# Patient Record
Sex: Male | Born: 1935
Health system: Southern US, Community
[De-identification: ages and names within clinical notes are randomized; demographics above are authoritative.]

## PROBLEM LIST (undated history)

## (undated) DIAGNOSIS — H259 Unspecified age-related cataract: Secondary | ICD-10-CM

## (undated) DIAGNOSIS — N400 Enlarged prostate without lower urinary tract symptoms: Secondary | ICD-10-CM

## (undated) DIAGNOSIS — Z95810 Presence of automatic (implantable) cardiac defibrillator: Secondary | ICD-10-CM

## (undated) DIAGNOSIS — D6869 Other thrombophilia: Secondary | ICD-10-CM

## (undated) DIAGNOSIS — N184 Chronic kidney disease, stage 4 (severe): Secondary | ICD-10-CM

## (undated) DIAGNOSIS — I255 Ischemic cardiomyopathy: Secondary | ICD-10-CM

## (undated) DIAGNOSIS — E782 Mixed hyperlipidemia: Secondary | ICD-10-CM

## (undated) DIAGNOSIS — G2581 Restless legs syndrome: Secondary | ICD-10-CM

## (undated) DIAGNOSIS — E559 Vitamin D deficiency, unspecified: Secondary | ICD-10-CM

## (undated) DIAGNOSIS — I480 Paroxysmal atrial fibrillation: Secondary | ICD-10-CM

## (undated) DIAGNOSIS — I509 Heart failure, unspecified: Secondary | ICD-10-CM

## (undated) DIAGNOSIS — I420 Dilated cardiomyopathy: Secondary | ICD-10-CM

## (undated) DIAGNOSIS — I2510 Atherosclerotic heart disease of native coronary artery without angina pectoris: Secondary | ICD-10-CM

## (undated) DIAGNOSIS — I639 Cerebral infarction, unspecified: Secondary | ICD-10-CM

## (undated) DIAGNOSIS — I10 Essential (primary) hypertension: Secondary | ICD-10-CM

## (undated) DIAGNOSIS — M1A9XX0 Chronic gout, unspecified, without tophus (tophi): Secondary | ICD-10-CM

## (undated) DIAGNOSIS — I5042 Chronic combined systolic (congestive) and diastolic (congestive) heart failure: Secondary | ICD-10-CM

## (undated) DIAGNOSIS — I4891 Unspecified atrial fibrillation: Secondary | ICD-10-CM

## (undated) DIAGNOSIS — I2721 Secondary pulmonary arterial hypertension: Secondary | ICD-10-CM

## (undated) DIAGNOSIS — D696 Thrombocytopenia, unspecified: Secondary | ICD-10-CM

## (undated) HISTORY — DX: Other thrombophilia: D68.69

## (undated) HISTORY — PX: CHOLECYSTECTOMY: SHX55

## (undated) HISTORY — PX: CORONARY ARTERY BYPASS GRAFT: SHX141

## (undated) HISTORY — DX: Vitamin D deficiency, unspecified: E55.9

## (undated) HISTORY — DX: Benign prostatic hyperplasia without lower urinary tract symptoms: N40.0

## (undated) HISTORY — DX: Essential (primary) hypertension: I10

## (undated) HISTORY — DX: Unspecified age-related cataract: H25.9

## (undated) HISTORY — DX: Ischemic cardiomyopathy: I25.5

## (undated) HISTORY — DX: Secondary pulmonary arterial hypertension: I27.21

## (undated) HISTORY — DX: Atherosclerotic heart disease of native coronary artery without angina pectoris: I25.10

## (undated) HISTORY — DX: Restless legs syndrome: G25.81

## (undated) HISTORY — DX: Chronic gout, unspecified, without tophus (tophi): M1A.9XX0

## (undated) HISTORY — DX: Mixed hyperlipidemia: E78.2

---
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
## 2016-05-31 DIAGNOSIS — I639 Cerebral infarction, unspecified: Secondary | ICD-10-CM

## 2016-05-31 HISTORY — DX: Cerebral infarction, unspecified: I63.9

## 2016-07-02 DIAGNOSIS — I639 Cerebral infarction, unspecified: Secondary | ICD-10-CM

## 2016-07-02 HISTORY — DX: Cerebral infarction, unspecified: I63.9

## 2018-10-30 HISTORY — PX: PACEMAKER IMPLANT: EP1218

## 2019-03-22 ENCOUNTER — Encounter: Payer: Self-pay | Admitting: Cardiology

## 2019-03-25 NOTE — Progress Notes (Signed)
Cardiology Office Note:    Date:  03/26/2019   ID:  Ricardo Payne, DOB May 29, 1931, MRN [id number]  PCP:  Patient, No Pcp Per  Cardiologist:  No primary care provider on file.  Electrophysiologist:  None   Referring MD: Jillyn Ledger, FNP  Chief Complaint  Patient presents with  . Congestive Heart Failure    History of Present Illness:    Ricardo Payne is a 85 y.o. male with a hx of CAD status post CABG in 1989, ischemic cardiomyopathy status post ICD 10/2018, atrial fibrillation, hypertension, hyperlipidemia, CVA  who is referred by Jillyn Ledger, FNP for an initial evaluation of heart failure.  Reports that he had a single-vessel CABG in 1989.  States that he has done well since that time, has not required further revascularization.  Has an ischemic cardiomyopathy status post ICD in June.  No issues with ICD.  Denies any shocks.  Reports that he had a syncopal episode in April, thought to be secondary to hyperkalemia.  He is on Eliquis 2.5 mg twice daily for his atrial fibrillation, was told that he is on lower dose Eliquis due to his age and renal function.  He has had issues with lower extremity edema, but was started on Bumex 1 mg daily in May and states that his symptoms have been well controlled since starting Bumex.  Reports he gets short of breath with walking up a flight of stairs but can walk about 2 blocks on level ground without stopping.  Denies any resting dyspnea.  Denies any chest pain.  No bleeding issues with Eliquis.  He moved to [facility] in September 2020.  Previously followed with a cardiologist in [place] (Dr Vergie Living with [facility]).     Past Medical History:  Diagnosis Date  . Acquired thrombophilia ([place])   . Age-related cataract of both eyes   . ASHD (arteriosclerotic heart disease)   . Atrial fibrillation ([place])   . Benign prostatic hyperplasia   . Cerebellar stroke ([place]) 07/02/2016  . Chronic congestive  heart failure ([place])   . Chronic gout without tophus   . Chronic kidney disease (CKD), stage III (moderate)    unspecified whether stage 3a or 3b  . Dilated cardiomyopathy ([place])   . Essential hypertension   . Ischemic cardiomyopathy   . Mixed hyperlipidemia   . Restless legs   . Secondary pulmonary arterial hypertension ([place])   . Vitamin D deficiency     Past Surgical History:  Procedure Laterality Date  . CORONARY ARTERY BYPASS GRAFT    . PACEMAKER IMPLANT  10/2018    Current Medications: Current Meds  Medication Sig  . allopurinol (ZYLOPRIM) 100 MG tablet Take 100 mg by mouth daily.  Marland Kitchen apixaban (ELIQUIS) 2.5 MG TABS tablet Take 2.5 mg by mouth 2 (two) times daily.  Marland Kitchen aspirin EC 81 MG tablet Take 81 mg by mouth daily.  . bumetanide (BUMEX) 1 MG tablet Take 1 mg by mouth daily.  . carvedilol (COREG) 6.25 MG tablet Take 6.25 mg by mouth 2 (two) times daily with a meal.  . ergocalciferol (VITAMIN D2) 1.25 MG (50000 UT) capsule Take 50,000 Units by mouth once a week.  . Folic Acid-Vit Q000111Q 123456 (FOLBEE) 2.5-25-1 MG TABS tablet Take 1 tablet by mouth daily.  . Niacin (VITAMIN B-3 PO) Take 1,000-2,000 mg by mouth 2 (two) times daily.  . pramipexole (MIRAPEX) 0.25 MG tablet Take 0.25 mg by mouth 2 (two) times daily.  Marland Kitchen  simvastatin (ZOCOR) 40 MG tablet Take 40 mg by mouth daily at 6 PM.  . spironolactone (ALDACTONE) 25 MG tablet Take 25 mg by mouth daily.  . tamsulosin (FLOMAX) 0.4 MG CAPS capsule Take 0.4 mg by mouth.     Allergies:   Patient has no known allergies.   Social History   Socioeconomic History  . Marital status: Widowed    Spouse name: Not on file  . Number of children: 2  . Years of education: Not on file  . Highest education level: Not on file  Occupational History  . Not on file  Social Needs  . Financial resource strain: Not on file  . Food insecurity    Worry: Not on file    Inability: Not on file  . Transportation needs    Medical: Not on file     Non-medical: Not on file  Tobacco Use  . Smoking status: Former Smoker    Quit date: 1988    Years since quitting: 32.8  . Smokeless tobacco: Never Used  Substance and Sexual Activity  . Alcohol use: Yes    Comment: rare  . Drug use: Never  . Sexual activity: Not on file  Lifestyle  . Physical activity    Days per week: Not on file    Minutes per session: Not on file  . Stress: Not on file  Relationships  . Social Herbalist on phone: Not on file    Gets together: Not on file    Attends religious service: Not on file    Active member of club or organization: Not on file    Attends meetings of clubs or organizations: Not on file    Relationship status: Not on file  Other Topics Concern  . Not on file  Social History Narrative  . Not on file     Family History: The patient's family history includes Heart attack in his brother; Heart disease in his father; Hypertension in his mother; Suicidality in his mother; Valvular heart disease in his father.  ROS:   Please see the history of present illness.     All other systems reviewed and are negative.  EKGs/Labs/Other Studies Reviewed:    The following studies were reviewed today:   EKG:  EKG is  ordered today.  The ekg ordered today demonstrates normal sinus rhythm, nonspecific interventricular conduction delay, nonspecific T wave flattening in I and aVL  Recent Labs: No results found for requested labs within last 8760 hours.  Recent Lipid Panel No results found for: CHOL, TRIG, HDL, CHOLHDL, VLDL, LDLCALC, LDLDIRECT  Physical Exam:    VS:  BP 129/73   Pulse 74   Temp (!) 97.3 F (36.3 C)   Ht 5\' 8"  (1.727 m)   Wt 203 lb 9.6 oz (92.4 kg)   SpO2 95%   BMI 30.96 kg/m     Wt Readings from Last 3 Encounters:  03/26/19 203 lb 9.6 oz (92.4 kg)     GEN:  Well nourished, well developed in no acute distress HEENT: Normal NECK: + JVD LYMPHATICS: No lymphadenopathy CARDIAC: RRR, no murmurs, rubs, gallops  RESPIRATORY:  Clear to auscultation without rales, wheezing or rhonchi  ABDOMEN: Soft, non-tender, non-distended MUSCULOSKELETAL:  Trace LE edema SKIN: Warm and dry NEUROLOGIC:  Alert and oriented x 3 PSYCHIATRIC:  Normal affect   ASSESSMENT:    1. Ischemic cardiomyopathy   2. Coronary artery disease involving native coronary artery of native heart without angina pectoris  3. Essential hypertension   4. Atrial fibrillation, unspecified type ([place])    PLAN:    In order of problems listed above:  CAD: Status post CABG in 1989.  No anginal symptoms.   -Will need to obtain records from prior cardiologist -Continue aspirin, beta-blocker -Check lipid panel.  Currently on simvastatin 40 mg daily, will likely need to switch to high intensity statin  Chronic systolic heart failure: ischemic cardiomyopathy.  Unknown ejection fraction.  On carvedilol 6.25 mg twice daily, Aldactone 25 mg daily, Bumex 1 mg daily.  Status post AICD -Obtain records as above -TTE -Refer to EP for device follow-up -Check BMET  Hypertension: On carvedilol 6.25 mg twice and Aldactone 25 mg daily.  Appears controlled  Atrial fibrillation: On carvedilol 6.25 mg twice daily.  On Eliquis 2.5 mg twice daily.  CHADS-VASc 7 - Unclear if appropriately on lower dose Eliquis.  Will check BMET to determine renal function - Continue carvedilol  RTC in 3 months   Medication Adjustments/Labs and Tests Ordered: Current medicines are reviewed at length with the patient today.  Concerns regarding medicines are outlined above.  Orders Placed This Encounter  Procedures  . Basic metabolic panel  . Lipid panel  . Magnesium  . Ambulatory referral to Cardiac Electrophysiology  . EKG 12-Lead  . ECHOCARDIOGRAM COMPLETE   No orders of the defined types were placed in this encounter.   Patient Instructions  Medication Instructions:  Continue same medications   Lab Work: Bmet,Magnesium,Lipid panel today    Testing/Procedures: Schedule Echo  Follow-Up: At [facility], you and your health needs are our priority.  As part of our continuing mission to provide you with exceptional heart care, we have created designated Provider Care Teams.  These Care Teams include your primary Cardiologist (physician) and Advanced Practice Providers (APPs -  Physician Assistants and Nurse Practitioners) who all work together to provide you with the care you need, when you need it.  Your next appointment:  3 months    Schedule appointment with EP for ICD      Signed, Donato Heinz, MD  03/26/2019 3:48 PM    [place]

## 2019-03-26 ENCOUNTER — Encounter: Payer: Self-pay | Admitting: Cardiology

## 2019-03-26 ENCOUNTER — Ambulatory Visit (INDEPENDENT_AMBULATORY_CARE_PROVIDER_SITE_OTHER): Payer: Medicare Other | Admitting: Cardiology

## 2019-03-26 ENCOUNTER — Other Ambulatory Visit: Payer: Self-pay

## 2019-03-26 VITALS — BP 129/73 | HR 74 | Temp 97.3°F | Ht 68.0 in | Wt 203.6 lb

## 2019-03-26 DIAGNOSIS — I2510 Atherosclerotic heart disease of native coronary artery without angina pectoris: Secondary | ICD-10-CM

## 2019-03-26 DIAGNOSIS — I4891 Unspecified atrial fibrillation: Secondary | ICD-10-CM

## 2019-03-26 DIAGNOSIS — I255 Ischemic cardiomyopathy: Secondary | ICD-10-CM | POA: Diagnosis not present

## 2019-03-26 DIAGNOSIS — I10 Essential (primary) hypertension: Secondary | ICD-10-CM | POA: Diagnosis not present

## 2019-03-26 NOTE — Patient Instructions (Signed)
Medication Instructions:  Continue same medications   Lab Work: Journalist, newspaper today   Testing/Procedures: Schedule Echo  Follow-Up: At [facility], you and your health needs are our priority.  As part of our continuing mission to provide you with exceptional heart care, we have created designated Provider Care Teams.  These Care Teams include your primary Cardiologist (physician) and Advanced Practice Providers (APPs -  Physician Assistants and Nurse Practitioners) who all work together to provide you with the care you need, when you need it.  Your next appointment:  3 months    Schedule appointment with EP for ICD

## 2019-03-27 ENCOUNTER — Telehealth: Payer: Self-pay | Admitting: Cardiology

## 2019-03-27 LAB — LIPID PANEL
Chol/HDL Ratio: 2 ratio (ref 0.0–5.0)
Cholesterol, Total: 144 mg/dL (ref 100–199)
HDL: 71 mg/dL (ref >39)
LDL Chol Calc (NIH): 59 mg/dL (ref 0–99)
Triglycerides: 70 mg/dL (ref 0–149)
VLDL Cholesterol Cal: 14 mg/dL (ref 5–40)

## 2019-03-27 LAB — BASIC METABOLIC PANEL
BUN/Creatinine Ratio: 28 — ABNORMAL HIGH (ref 10–24)
BUN: 58 mg/dL — ABNORMAL HIGH (ref 8–27)
CO2: 24 mmol/L (ref 20–29)
Calcium: 9 mg/dL (ref 8.6–10.2)
Chloride: 103 mmol/L (ref 96–106)
Creatinine, Ser: 2.04 mg/dL — ABNORMAL HIGH (ref 0.76–1.27)
GFR calc Af Amer: 33 mL/min/{1.73_m2} — ABNORMAL LOW (ref >59)
GFR calc non Af Amer: 29 mL/min/{1.73_m2} — ABNORMAL LOW (ref >59)
Glucose: 83 mg/dL (ref 65–99)
Potassium: 5.1 mmol/L (ref 3.5–5.2)
Sodium: 141 mmol/L (ref 134–144)

## 2019-03-27 LAB — MAGNESIUM: Magnesium: 2.1 mg/dL (ref 1.6–2.3)

## 2019-03-27 NOTE — Telephone Encounter (Signed)
[facility] received 33 pages of medical records from [facility] delivering to [facility] (Dr. Newman Nickels nurse). 03/27/19  KLM

## 2019-03-27 NOTE — Telephone Encounter (Signed)
[facility] faxed request for medical records to [facility] 03/27/19  KLM

## 2019-03-30 ENCOUNTER — Other Ambulatory Visit: Payer: Self-pay

## 2019-04-02 ENCOUNTER — Ambulatory Visit (HOSPITAL_COMMUNITY): Payer: Medicare Other | Attending: Internal Medicine

## 2019-04-02 ENCOUNTER — Other Ambulatory Visit: Payer: Self-pay

## 2019-04-02 DIAGNOSIS — I255 Ischemic cardiomyopathy: Secondary | ICD-10-CM | POA: Diagnosis present

## 2019-04-08 ENCOUNTER — Encounter: Payer: Self-pay | Admitting: Cardiology

## 2019-04-08 NOTE — Progress Notes (Signed)
Received records from patient's cardiologist in [place].  Meds: ASA 81, Bumex 1 mg qd, Coreg 12.5 BID, Eliquis 2.5 mg BID, simvastatin 40 mg   PMHx: CVA 07/02/16, AF, Chronic systolic heart failure, CABG x1 in 1990, CKD stage III, hypertension, hyperlipidemia  From documentation, baseline creatinine is around 2.0. Not on ACE inhibitor or Entresto given his CKD. He did not tolerate spironolactone secondary to hyperkalemia. Last echo 07/2018 showed LVEF 25 to 30%, mild to moderate MR, moderate to severe TR, severe pulmonary hypertension.  Pharmacologic stress test in 2017 showed an inferior apical infarct without ischemia.  He has a [employer] single-chamber ICD implanted on 03/17/2018. On last device interrogation from 10/25/2018, showed no ventricular arrhythmias. VP less than 1%.

## 2019-04-18 ENCOUNTER — Ambulatory Visit (INDEPENDENT_AMBULATORY_CARE_PROVIDER_SITE_OTHER): Payer: Medicare Other | Admitting: Internal Medicine

## 2019-04-18 ENCOUNTER — Other Ambulatory Visit: Payer: Self-pay

## 2019-04-18 ENCOUNTER — Encounter: Payer: Self-pay | Admitting: Internal Medicine

## 2019-04-18 DIAGNOSIS — I428 Other cardiomyopathies: Secondary | ICD-10-CM

## 2019-04-18 DIAGNOSIS — Z95810 Presence of automatic (implantable) cardiac defibrillator: Secondary | ICD-10-CM | POA: Insufficient documentation

## 2019-04-18 LAB — CUP PACEART INCLINIC DEVICE CHECK
Battery Remaining Longevity: 93 mo
Brady Statistic RV Percent Paced: 0.05 %
Date Time Interrogation Session: 20201118100654
HighPow Impedance: 55 Ohm
Implantable Lead Implant Date: 20191018
Implantable Lead Location: 753860
Implantable Pulse Generator Implant Date: 20191018
Lead Channel Impedance Value: 412.5 Ohm
Lead Channel Pacing Threshold Amplitude: 0.5 V
Lead Channel Pacing Threshold Pulse Width: 0.5 ms
Lead Channel Sensing Intrinsic Amplitude: 11.4 mV
Lead Channel Setting Pacing Amplitude: 2 V
Lead Channel Setting Pacing Pulse Width: 0.5 ms
Lead Channel Setting Sensing Sensitivity: 0.5 mV
Pulse Gen Serial Number: 9850983

## 2019-04-18 NOTE — Patient Instructions (Signed)
Medication Instructions:  Your physician recommends that you continue on your current medications as directed. Please refer to the Current Medication list given to you today.  Labwork: None ordered.  Testing/Procedures: None ordered.  Follow-Up: Your physician wants you to follow-up in: one year with Dr. Lovena Le.   You will receive a reminder letter in the mail two months in advance. If you don't receive a letter, please call our office to schedule the follow-up appointment.  Remote monitoring is used to monitor your ICD from home. This monitoring reduces the number of office visits required to check your device to one time per year. It allows [place] to keep an eye on the functioning of your device to ensure it is working properly. You are scheduled for a device check from home on 07/18/2019. You may send your transmission at any time that day. If you have a wireless device, the transmission will be sent automatically. After your physician reviews your transmission, you will receive a postcard with your next transmission date.  Any Other Special Instructions Will Be Listed Below (If Applicable).  If you need a refill on your cardiac medications before your next appointment, please call your pharmacy.

## 2019-04-18 NOTE — Progress Notes (Signed)
HPI Ricardo Payne is referred today by Dr. Nechama Guard for ongoing evaluation and management of his ICD. He is a pleasant 85 yo man with CAD, s/p CABG over 30 years ago. He has atrial fib and HTN. He has an EF of 40%. He has class 2 symptoms though he has been sedentary during Covid. He denies chest pain or peripheral edema.   No Known Allergies   Current Outpatient Medications  Medication Sig Dispense Refill  . allopurinol (ZYLOPRIM) 100 MG tablet Take 100 mg by mouth daily.    Marland Kitchen apixaban (ELIQUIS) 2.5 MG TABS tablet Take 2.5 mg by mouth 2 (two) times daily.    Marland Kitchen aspirin EC 81 MG tablet Take 81 mg by mouth daily.    . bumetanide (BUMEX) 1 MG tablet Take 1 mg by mouth daily.    . carvedilol (COREG) 6.25 MG tablet Take 3.125 mg by mouth 2 (two) times daily with a meal.    . ergocalciferol (VITAMIN D2) 1.25 MG (50000 UT) capsule Take 50,000 Units by mouth once a week.    . Folic Acid-Vit Q000111Q 123456 (FOLBEE) 2.5-25-1 MG TABS tablet Take 1 tablet by mouth daily.    . Niacin (VITAMIN B-3 PO) Take 1,000-2,000 mg by mouth 2 (two) times daily.    . pramipexole (MIRAPEX) 0.25 MG tablet Take 0.25 mg by mouth 2 (two) times daily.    . simvastatin (ZOCOR) 40 MG tablet Take 40 mg by mouth daily at 6 PM.     No current facility-administered medications for this visit.      Past Medical History:  Diagnosis Date  . Acquired thrombophilia ([place])   . Age-related cataract of both eyes   . ASHD (arteriosclerotic heart disease)   . Atrial fibrillation ([place])   . Benign prostatic hyperplasia   . Cerebellar stroke ([place]) 07/02/2016  . Chronic congestive heart failure ([place])   . Chronic gout without tophus   . Chronic kidney disease (CKD), stage III (moderate)    unspecified whether stage 3a or 3b  . Dilated cardiomyopathy ([place])   . Essential hypertension   . Ischemic cardiomyopathy   . Mixed hyperlipidemia   . Restless legs   . Secondary pulmonary arterial hypertension ([place])   . Vitamin D  deficiency     ROS:   All systems reviewed and negative except as noted in the HPI.   Past Surgical History:  Procedure Laterality Date  . CORONARY ARTERY BYPASS GRAFT    . PACEMAKER IMPLANT  10/2018     Family History  Problem Relation Age of Onset  . Hypertension Mother   . Suicidality Mother   . Valvular heart disease Father   . Heart disease Father   . Heart attack Brother      Social History   Socioeconomic History  . Marital status: Widowed    Spouse name: Not on file  . Number of children: 2  . Years of education: Not on file  . Highest education level: Not on file  Occupational History  . Not on file  Social Needs  . Financial resource strain: Not on file  . Food insecurity    Worry: Not on file    Inability: Not on file  . Transportation needs    Medical: Not on file    Non-medical: Not on file  Tobacco Use  . Smoking status: Former Smoker    Quit date: 1988    Years since quitting: 32.9  . Smokeless tobacco: Never  Used  Substance and Sexual Activity  . Alcohol use: Yes    Comment: rare  . Drug use: Never  . Sexual activity: Not on file  Lifestyle  . Physical activity    Days per week: Not on file    Minutes per session: Not on file  . Stress: Not on file  Relationships  . Social Herbalist on phone: Not on file    Gets together: Not on file    Attends religious service: Not on file    Active member of club or organization: Not on file    Attends meetings of clubs or organizations: Not on file    Relationship status: Not on file  . Intimate partner violence    Fear of current or ex partner: Not on file    Emotionally abused: Not on file    Physically abused: Not on file    Forced sexual activity: Not on file  Other Topics Concern  . Not on file  Social History Narrative  . Not on file     BP 136/76   Pulse 72   Ht 5\' 8"  (1.727 m)   Wt 212 lb 12.8 oz (96.5 kg)   SpO2 96%   BMI 32.36 kg/m   Physical Exam:  Well  appearing NAD HEENT: Unremarkable Neck:  No JVD, no thyromegally Lymphatics:  No adenopathy Back:  No CVA tenderness Lungs:  Clear with no wheezes HEART:  Regular rate rhythm, no murmurs, no rubs, no clicks Abd:  soft, positive bowel sounds, no organomegally, no rebound, no guarding Ext:  2 plus pulses, no edema, no cyanosis, no clubbing Skin:  No rashes no nodules Neuro:  CN II through XII intact, motor grossly intact  DEVICE  Normal device function.  See PaceArt for details.   Assess/Plan: 1. ICM - he is asymptomatic. I have recommended he continue his current meds and to remain active. 2. Chronic systolic heart failure - his symptoms are class 2. He will continue his current meds. 3. ICD - his [employer] single chamber ICD is working normally. We will recheck in several months.  Mikle Bosworth.D.

## 2019-04-23 ENCOUNTER — Telehealth: Payer: Self-pay | Admitting: *Deleted

## 2019-04-23 ENCOUNTER — Other Ambulatory Visit: Payer: Self-pay

## 2019-04-23 NOTE — Telephone Encounter (Signed)
Spoke with Langley Gauss at [facility], requested patient's release in [place]. She agreed to release patient today.

## 2019-04-24 ENCOUNTER — Other Ambulatory Visit: Payer: Self-pay

## 2019-04-24 NOTE — Telephone Encounter (Signed)
LM for [employer] Curator) as patient has not yet been released in [employer]. DC phone number given for call back.

## 2019-04-25 NOTE — Telephone Encounter (Signed)
Spoke with patient. Advised I am still working on transferring monitoring from previous clinic. Explained I will call with an update within the next couple of weeks. Patient verbalizes understanding and agreement with plan.

## 2019-05-08 NOTE — Telephone Encounter (Signed)
I spoke with the nurse and asked her to release the pt in [place] because the pt will be followed by dr.Taylor. She said she will give the message to the device clinic.

## 2019-05-10 NOTE — Telephone Encounter (Signed)
Spoke with patient. Assisted with manual transmission to update monitor. Advised future transmissions should be received automatically. Pt verbalizes understanding. No further questions at this time.

## 2019-05-10 NOTE — Telephone Encounter (Signed)
Pt is transferred into our system and is scheduled for a transmission for 07-18-2019.

## 2019-05-17 ENCOUNTER — Telehealth: Payer: Self-pay | Admitting: Cardiology

## 2019-05-17 NOTE — Progress Notes (Addendum)
Cardiology Office Note:    Date:  05/18/2019   ID:  Ricardo Payne, DOB July 27, 1930, MRN [id number]  PCP:  [name], FNP  Cardiologist:  No primary care provider on file.  Electrophysiologist:  None   Referring MD: Jillyn Ledger, FNP  No chief complaint on file.   History of Present Illness:    Ricardo Payne is a 85 y.o. male with a hx of CAD status post CABG x1 in 1990, ischemic cardiomyopathy status post ICD 02/2018, atrial fibrillation, hypertension, hyperlipidemia, CVA 07/2016, CKD stage III who presents for follow-up.  He was seen initially on 03/26/2019.  He had moved from [place] to [place] in September 2020.  He reported no issues, states that he had had issues with lower extremity edema earlier in the year but started on Bumex 1 mg daily in May and symptoms have been well controlled.  TTE from 07/2018 in [place] showed EF 25 to 30%.  TTE here in 04/2019 showed improvement in EF to 40 to 45%.  At initial appointment he was taking carvedilol 625 mg twice daily and Aldactone 25 mg daily.  Records from his cardiologist were obtained and indicated that he was not on an ACE/ARB/Entresto due to CKD (baseline creatinine around 2.0).  Records from cardiologist indicated that spironolactone was tried and discontinued secondary to hyperkalemia.  However he was still taking when seen in clinic.  Labs showed creatinine 2.04, potassium 5.1; he was told to discontinue his spironolactone.  He presents today with worsening lower extremity edema and shortness of breath.  States that he gained about 10 pounds after stopping spironolactone.  Also reports significant swelling in his legs.  States he has felt progressively more short of breath.  Denies any shortness of breath at rest, but states becomes short of breath with minimal exertion.  Has been sleeping on 3 pillows at night.  He denies any chest pain, lightheadedness or syncope.   Wt Readings from Last 3 Encounters:  05/18/19 217 lb  3.2 oz (98.5 kg)  04/18/19 212 lb 12.8 oz (96.5 kg)  03/26/19 203 lb 9.6 oz (92.4 kg)      Past Medical History:  Diagnosis Date  . Acquired thrombophilia ([place])   . Age-related cataract of both eyes   . ASHD (arteriosclerotic heart disease)   . Atrial fibrillation ([place])   . Benign prostatic hyperplasia   . Cerebellar stroke ([place]) 07/02/2016  . Chronic congestive heart failure ([place])   . Chronic gout without tophus   . Chronic kidney disease (CKD), stage III (moderate)    unspecified whether stage 3a or 3b  . Dilated cardiomyopathy ([place])   . Essential hypertension   . Ischemic cardiomyopathy   . Mixed hyperlipidemia   . Restless legs   . Secondary pulmonary arterial hypertension ([place])   . Vitamin D deficiency     Past Surgical History:  Procedure Laterality Date  . CORONARY ARTERY BYPASS GRAFT    . PACEMAKER IMPLANT  10/2018    Current Medications: Current Meds  Medication Sig  . allopurinol (ZYLOPRIM) 100 MG tablet Take 100 mg by mouth daily.  Marland Kitchen apixaban (ELIQUIS) 2.5 MG TABS tablet Take 2.5 mg by mouth 2 (two) times daily.  Marland Kitchen aspirin EC 81 MG tablet Take 81 mg by mouth daily.  . bumetanide (BUMEX) 1 MG tablet Take 1 mg by mouth as directed. TAKE 2 TABLETS TWICE A DAY  . carvedilol (COREG) 6.25 MG tablet Take 3.125 mg by mouth 2 (two) times daily  with a meal.  . ergocalciferol (VITAMIN D2) 1.25 MG (50000 UT) capsule Take 50,000 Units by mouth once a week.  . Folic Acid-Vit Q000111Q 123456 (FOLBEE) 2.5-25-1 MG TABS tablet Take 1 tablet by mouth daily.  . Niacin (VITAMIN B-3 PO) Take 1,000-2,000 mg by mouth 2 (two) times daily.  . pramipexole (MIRAPEX) 0.25 MG tablet Take 0.25 mg by mouth 2 (two) times daily.  . simvastatin (ZOCOR) 40 MG tablet Take 40 mg by mouth daily at 6 PM.     Allergies:   Patient has no known allergies.   Social History   Socioeconomic History  . Marital status: Widowed    Spouse name: Not on file  . Number of children: 2  . Years of education:  Not on file  . Highest education level: Not on file  Occupational History  . Not on file  Tobacco Use  . Smoking status: Former Smoker    Quit date: 1988    Years since quitting: 32.9  . Smokeless tobacco: Never Used  Substance and Sexual Activity  . Alcohol use: Yes    Comment: rare  . Drug use: Never  . Sexual activity: Not on file  Other Topics Concern  . Not on file  Social History Narrative  . Not on file   Social Determinants of Health   Financial Resource Strain:   . Difficulty of Paying Living Expenses: Not on file  Food Insecurity:   . Worried About Charity fundraiser in the Last Year: Not on file  . Ran Out of Food in the Last Year: Not on file  Transportation Needs:   . Lack of Transportation (Medical): Not on file  . Lack of Transportation (Non-Medical): Not on file  Physical Activity:   . Days of Exercise per Week: Not on file  . Minutes of Exercise per Session: Not on file  Stress:   . Feeling of Stress : Not on file  Social Connections:   . Frequency of Communication with Friends and Family: Not on file  . Frequency of Social Gatherings with Friends and Family: Not on file  . Attends Religious Services: Not on file  . Active Member of Clubs or Organizations: Not on file  . Attends Archivist Meetings: Not on file  . Marital Status: Not on file     Family History: The patient's family history includes Heart attack in his brother; Heart disease in his father; Hypertension in his mother; Suicidality in his mother; Valvular heart disease in his father.  ROS:   Please see the history of present illness.     All other systems reviewed and are negative.  EKGs/Labs/Other Studies Reviewed:    The following studies were reviewed today:   EKG:  EKG is  ordered today.  The ekg ordered today demonstrates normal sinus rhythm, rate 71, left bundle branch block, first-degree AV block TTE 04/02/19:  1. Left ventricular ejection fraction, by visual  estimation, is 40 to 45%. The left ventricle has moderately decreased function. Left ventricular septal wall thickness was normal. Mildly increased left ventricular posterior wall thickness. There is  borderline left ventricular hypertrophy.  2. Inferior and inferoseptal akinesis and thinning.  3. Elevated left atrial and left ventricular end-diastolic pressures.  4. Left ventricular diastolic parameters are consistent with Grade III diastolic dysfunction (restrictive).  5. Global right ventricle has mildly reduced systolic function.The right ventricular size is normal. No increase in right ventricular wall thickness.  6. Left atrial size was  severely dilated.  7. Right atrial size was moderately dilated.  8. The mitral valve is abnormal. Mild mitral valve regurgitation.  9. The tricuspid valve is grossly normal. Tricuspid valve regurgitation moderate. 10. Aortic valve area, by VTI measures 1.60 cm. 11. Aortic valve mean gradient measures 8.0 mmHg. 12. Aortic valve peak gradient measures 13.1 mmHg. 13. The aortic valve is tricuspid. Aortic valve regurgitation is trivial. Mild aortic valve stenosis. 14. The pulmonic valve was grossly normal. Pulmonic valve regurgitation is mild. 15. Moderately elevated pulmonary artery systolic pressure. 16. The inferior vena cava is dilated in size with >50% respiratory variability, suggesting right atrial pressure of 8 mmHg. 17. The average left ventricular global longitudinal strain is -10.7 %. 18. A ICD is visualized.  Recent Labs: 03/26/2019: BUN 58; Creatinine, Ser 2.04; Magnesium 2.1; Potassium 5.1; Sodium 141  Recent Lipid Panel    Component Value Date/Time   CHOL 144 03/26/2019 1425   TRIG 70 03/26/2019 1425   HDL 71 03/26/2019 1425   CHOLHDL 2.0 03/26/2019 1425   LDLCALC 59 03/26/2019 1425    Physical Exam:    VS:  BP 138/68   Pulse 71   Temp (!) 97 F (36.1 C)   Ht 5\' 8"  (1.727 m)   Wt 217 lb 3.2 oz (98.5 kg)   SpO2 98%   BMI 33.03  kg/m     Wt Readings from Last 3 Encounters:  05/18/19 217 lb 3.2 oz (98.5 kg)  04/18/19 212 lb 12.8 oz (96.5 kg)  03/26/19 203 lb 9.6 oz (92.4 kg)     GEN:  Well nourished, well developed in no acute distress HEENT: Normal NECK: + JVD LYMPHATICS: No lymphadenopathy CARDIAC: RRR, no murmurs, rubs, gallops RESPIRATORY:  Clear to auscultation without rales, wheezing or rhonchi  ABDOMEN: Soft, non-tender, non-distended MUSCULOSKELETAL:  2+ LE edema SKIN: Warm and dry NEUROLOGIC:  Alert and oriented x 3 PSYCHIATRIC:  Normal affect   ASSESSMENT:    1. Acute on chronic systolic heart failure ([place])   2. Therapeutic drug monitoring   3. SOB (shortness of breath)   4. Coronary artery disease involving native coronary artery of native heart without angina pectoris   5. Paroxysmal atrial fibrillation ([facility])    PLAN:    In order of problems listed above:  Acute on chronic systolic heart failure: ischemic cardiomyopathy.  EF 40 to 45%.  On carvedilol 6.25 mg twice daily, Bumex 1 mg daily.  Status post AICD.  Appears decompensated.  Warm and wet on exam.  Weight up 14 lbs since office visit in October. -Will check CMET, CBC, BNP -Discussed that patient may need admission for IV diuresis.  He would like to avoid this and attempt to manage as outpatient.  Recommend increasing Bumex to 2 mg twice daily and have close follow-up at beginning of next week to assess response.  If not improving, may need admission for IV diuresis.  Advised that if worsening symptoms over weekend should go to the ED for evaluation  CAD: Status post CABG x1 in 1990.  Denies any chest pain  -Continue aspirin, beta-blocker -Currently on simvastatin 40 mg daily, LDL 59, at goal less than 70  Hypertension: On carvedilol 6.25 mg twice.  Appears controlled  Atrial fibrillation: On carvedilol 6.25 mg twice daily.  On Eliquis 2.5 mg twice daily.  CHADS-VASc 7 - Continue carvedilol - Continue Eliquis 2.5 mg BID, which is  an appropriate dose for him given age greater than 59 and creatinine greater than 1.5  Follow-up scheduled for 12/21   Medication Adjustments/Labs and Tests Ordered: Current medicines are reviewed at length with the patient today.  Concerns regarding medicines are outlined above.  Orders Placed This Encounter  Procedures  . CBC w/Diff  . Comprehensive Metabolic Panel (CMET)  . B Nat Peptide  . EKG 12/Charge capture   No orders of the defined types were placed in this encounter.   Patient Instructions  Medication Instructions:  INCREASE YOUR BUMETANIDE TO 1 MG 2 TABLETS TWICE A DAY   *If you need a refill on your cardiac medications before your next appointment, please call your pharmacy*  Lab Work: CMET/BNP/CBC TODAY   If you have labs (blood work) drawn today and your tests are completely normal, you will receive your results only by: Marland Kitchen MyChart Message (if you have MyChart) OR . A paper copy in the mail If you have any lab test that is abnormal or we need to change your treatment, we will call you to review the results.  Testing/Procedures: NONE  Follow-Up: Monday 05/21/2019 3:15 PM WITH Ricardo Long DNP     Signed, Donato Heinz, MD  05/18/2019 6:57 PM    [place]

## 2019-05-17 NOTE — Telephone Encounter (Signed)
I spoke to the patient who called because he has weight gain, swelling in lower legs/toes and SOB.  His Spironolactone was stopped on 10/30 and since then he has experienced these symptoms, primarily at night.  He weighed 212 lbs on 11/18 and today weighs 223 lbs. Please advise, thank you.

## 2019-05-17 NOTE — Telephone Encounter (Signed)
Can we add him on to my schedule for a clinic appointment tomorrow?

## 2019-05-17 NOTE — Telephone Encounter (Signed)
Pt c/o swelling: STAT is pt has developed SOB within 24 hours  1) How much weight have you gained and in what time span? He gained  Weight, but does not know how much  2) If swelling, where is the swelling located? Knee down to his toes  3) Are you currently taking a fluid pill?  yes  4) Are you currently SOB?  Yes- not right now 5)  6) Do you have a log of your daily weights (if so, list)? no  7) Have you gained 3 pounds in a day or 5 pounds in a week? Not sure  8) Have you traveled recently? no

## 2019-05-17 NOTE — Telephone Encounter (Signed)
I spoke to the patient and scheduled OV with Dr Gardiner Rhyme on 12/18 @ 2:25 pm.  He verbalized understanding.

## 2019-05-18 ENCOUNTER — Encounter: Payer: Self-pay | Admitting: Cardiology

## 2019-05-18 ENCOUNTER — Other Ambulatory Visit: Payer: Self-pay

## 2019-05-18 ENCOUNTER — Ambulatory Visit (INDEPENDENT_AMBULATORY_CARE_PROVIDER_SITE_OTHER): Payer: Medicare Other | Admitting: Cardiology

## 2019-05-18 VITALS — BP 138/68 | HR 71 | Temp 97.0°F | Ht 68.0 in | Wt 217.2 lb

## 2019-05-18 DIAGNOSIS — Z5181 Encounter for therapeutic drug level monitoring: Secondary | ICD-10-CM | POA: Diagnosis not present

## 2019-05-18 DIAGNOSIS — I2510 Atherosclerotic heart disease of native coronary artery without angina pectoris: Secondary | ICD-10-CM | POA: Diagnosis not present

## 2019-05-18 DIAGNOSIS — I480 Paroxysmal atrial fibrillation: Secondary | ICD-10-CM

## 2019-05-18 DIAGNOSIS — I5023 Acute on chronic systolic (congestive) heart failure: Secondary | ICD-10-CM

## 2019-05-18 DIAGNOSIS — R0602 Shortness of breath: Secondary | ICD-10-CM | POA: Diagnosis not present

## 2019-05-18 NOTE — Patient Instructions (Addendum)
Medication Instructions:  INCREASE YOUR BUMETANIDE TO 1 MG 2 TABLETS TWICE A DAY   *If you need a refill on your cardiac medications before your next appointment, please call your pharmacy*  Lab Work: CMET/BNP/CBC TODAY   If you have labs (blood work) drawn today and your tests are completely normal, you will receive your results only by: Marland Kitchen MyChart Message (if you have MyChart) OR . A paper copy in the mail If you have any lab test that is abnormal or we need to change your treatment, we will call you to review the results.  Testing/Procedures: NONE  Follow-Up: Monday 05/21/2019 3:15 PM WITH Arnold Long DNP

## 2019-05-19 LAB — CBC WITH DIFFERENTIAL/PLATELET
Basophils Absolute: 0 10*3/uL (ref 0.0–0.2)
Basos: 1 %
EOS (ABSOLUTE): 0.1 10*3/uL (ref 0.0–0.4)
Eos: 2 %
Hematocrit: 34.3 % — ABNORMAL LOW (ref 37.5–51.0)
Hemoglobin: 11.7 g/dL — ABNORMAL LOW (ref 13.0–17.7)
Immature Grans (Abs): 0 10*3/uL (ref 0.0–0.1)
Immature Granulocytes: 0 %
Lymphocytes Absolute: 0.9 10*3/uL (ref 0.7–3.1)
Lymphs: 14 %
MCH: 32 pg (ref 26.6–33.0)
MCHC: 34.1 g/dL (ref 31.5–35.7)
MCV: 94 fL (ref 79–97)
Monocytes Absolute: 0.5 10*3/uL (ref 0.1–0.9)
Monocytes: 8 %
Neutrophils Absolute: 4.6 10*3/uL (ref 1.4–7.0)
Neutrophils: 75 %
Platelets: 84 10*3/uL — CL (ref 150–450)
RBC: 3.66 x10E6/uL — ABNORMAL LOW (ref 4.14–5.80)
RDW: 13.1 % (ref 11.6–15.4)
WBC: 6.1 10*3/uL (ref 3.4–10.8)

## 2019-05-19 LAB — COMPREHENSIVE METABOLIC PANEL
ALT: 24 IU/L (ref 0–44)
AST: 27 IU/L (ref 0–40)
Albumin/Globulin Ratio: 1.9 (ref 1.2–2.2)
Albumin: 4.6 g/dL (ref 3.6–4.6)
Alkaline Phosphatase: 161 IU/L — ABNORMAL HIGH (ref 39–117)
BUN/Creatinine Ratio: 29 — ABNORMAL HIGH (ref 10–24)
BUN: 51 mg/dL — ABNORMAL HIGH (ref 8–27)
Bilirubin Total: 1.3 mg/dL — ABNORMAL HIGH (ref 0.0–1.2)
CO2: 21 mmol/L (ref 20–29)
Calcium: 9.4 mg/dL (ref 8.6–10.2)
Chloride: 105 mmol/L (ref 96–106)
Creatinine, Ser: 1.75 mg/dL — ABNORMAL HIGH (ref 0.76–1.27)
GFR calc Af Amer: 40 mL/min/{1.73_m2} — ABNORMAL LOW (ref >59)
GFR calc non Af Amer: 34 mL/min/{1.73_m2} — ABNORMAL LOW (ref >59)
Globulin, Total: 2.4 g/dL (ref 1.5–4.5)
Glucose: 102 mg/dL — ABNORMAL HIGH (ref 65–99)
Potassium: 4.5 mmol/L (ref 3.5–5.2)
Sodium: 143 mmol/L (ref 134–144)
Total Protein: 7 g/dL (ref 6.0–8.5)

## 2019-05-19 LAB — BRAIN NATRIURETIC PEPTIDE: BNP: 1044.7 pg/mL — ABNORMAL HIGH (ref 0.0–100.0)

## 2019-05-20 NOTE — Progress Notes (Signed)
Cardiology Office Note   Date:  05/21/2019   ID:  Cypher, [name] 04-26-1931, MRN [id number]  PCP:  [name], FNP  Cardiologist:   No chief complaint on file.    History of Present Illness: Ricardo Payne is a 85 y.o. male who presents for chronic systolic heart failure, ischemic cardiomyopathy, EF of 40% to 45%.  Status post AICD.  Atrial fibrillation, CAD status post CABG x1 in 1990, chronic left bundle branch block, and hyperlipidemia.  He was last seen by Dr. Carlena Hurl on 05/18/2019 and was found to be volume overloaded, with symptoms of PND, lower extremity edema, and dyspnea.  The patient had gained approximately 10 pounds after stopping spironolactone..  Was discussed with the patient be admitted for IV diuresis.  The patient wanted to wait to be admitted and   be treated as an outpatient with close follow-up.  Bumex was increased to 2 mg twice daily.  He was continued on carvedilol 6.25 mg twice daily.  Weight on that office visit 217 pounds with a base line dry weight of 203 pounds.  He was advised that if his symptoms worsened over the weekend he was to be seen in the emergency room.  [name] comes today feeling much better, he has lost approximately 4 pounds dropping from 217 pounds to 213 pounds over 3 days.  He states that he is breathing better is able to lie down in his bed without being short of breath, he offers no complaints of PND.  He continues to have some lower extremity edema but his legs are better, per his observance.  He states that he was able to climb the stairs in his home now without being short of breath.  Past Medical History:  Diagnosis Date  . Acquired thrombophilia ([place])   . Age-related cataract of both eyes   . ASHD (arteriosclerotic heart disease)   . Atrial fibrillation ([place])   . Benign prostatic hyperplasia   . Cerebellar stroke ([place]) 07/02/2016  . Chronic congestive heart failure ([place])   . Chronic gout without tophus   .  Chronic kidney disease (CKD), stage III (moderate)    unspecified whether stage 3a or 3b  . Dilated cardiomyopathy ([place])   . Essential hypertension   . Ischemic cardiomyopathy   . Mixed hyperlipidemia   . Restless legs   . Secondary pulmonary arterial hypertension ([place])   . Vitamin D deficiency     Past Surgical History:  Procedure Laterality Date  . CORONARY ARTERY BYPASS GRAFT    . PACEMAKER IMPLANT  10/2018     Current Outpatient Medications  Medication Sig Dispense Refill  . allopurinol (ZYLOPRIM) 100 MG tablet Take 100 mg by mouth daily.    Marland Kitchen apixaban (ELIQUIS) 2.5 MG TABS tablet Take 2.5 mg by mouth 2 (two) times daily.    Marland Kitchen aspirin EC 81 MG tablet Take 81 mg by mouth daily.    . bumetanide (BUMEX) 1 MG tablet Take 2 tablets (2 mg total) by mouth as directed. 180 tablet 2  . carvedilol (COREG) 6.25 MG tablet Take 3.125 mg by mouth 2 (two) times daily with a meal.    . ergocalciferol (VITAMIN D2) 1.25 MG (50000 UT) capsule Take 50,000 Units by mouth once a week.    . Folic Acid-Vit Q000111Q 123456 (FOLBEE) 2.5-25-1 MG TABS tablet Take 1 tablet by mouth daily.    . Niacin (VITAMIN B-3 PO) Take 1,000-2,000 mg by mouth 2 (two) times daily.    Marland Kitchen  pramipexole (MIRAPEX) 0.25 MG tablet Take 0.25 mg by mouth 2 (two) times daily.    . simvastatin (ZOCOR) 40 MG tablet Take 40 mg by mouth daily at 6 PM.     No current facility-administered medications for this visit.    Allergies:   Patient has no known allergies.    Social History:  The patient  reports that he quit smoking about 32 years ago. He has never used smokeless tobacco. He reports current alcohol use. He reports that he does not use drugs.   Family History:  The patient's family history includes Heart attack in his brother; Heart disease in his father; Hypertension in his mother; Suicidality in his mother; Valvular heart disease in his father.    ROS: All other systems are reviewed and negative. Unless otherwise mentioned in  H&P    PHYSICAL EXAM: VS:  BP 124/72   Pulse 74   Temp 98.2 F (36.8 C)   Ht 5\' 8"  (1.727 m)   Wt 213 lb 6.4 oz (96.8 kg)   SpO2 97%   BMI 32.45 kg/m  , BMI Body mass index is 32.45 kg/m. GEN: Well nourished, well developed, in no acute distress HEENT: normal Neck: no JVD, carotid bruits, or masses Cardiac: RRR; no murmurs, rubs, or gallops, 3+ pitting edema with shiny skin and petechiae noted bilaterally.  Respiratory: Normal respiratory effort, some crackles in the bases but no wheezing. GI: soft, nontender, nondistended, + BS MS: no deformity or atrophy Skin: warm and dry, no rash Neuro:  Strength and sensation are intact Psych: euthymic mood, full affect   EKG: Not completed this office visit  Recent Labs: 03/26/2019: Magnesium 2.1 05/18/2019: ALT 24; BNP 1,044.7; BUN 51; Creatinine, Ser 1.75; Hemoglobin 11.7; Platelets 84; Potassium 4.5; Sodium 143    Lipid Panel    Component Value Date/Time   CHOL 144 03/26/2019 1425   TRIG 70 03/26/2019 1425   HDL 71 03/26/2019 1425   CHOLHDL 2.0 03/26/2019 1425   LDLCALC 59 03/26/2019 1425      Wt Readings from Last 3 Encounters:  05/21/19 213 lb 6.4 oz (96.8 kg)  05/18/19 217 lb 3.2 oz (98.5 kg)  04/18/19 212 lb 12.8 oz (96.5 kg)      Other studies Reviewed:  TTE 04/02/19: 1. Left ventricular ejection fraction, by visual estimation, is 40 to 45%. The left ventricle has moderately decreased function. Left ventricular septal wall thickness was normal. Mildly increased left ventricular posterior wall thickness. There is  borderline left ventricular hypertrophy. 2. Inferior and inferoseptal akinesis and thinning. 3. Elevated left atrial and left ventricular end-diastolic pressures. 4. Left ventricular diastolic parameters are consistent with Grade III diastolic dysfunction (restrictive). 5. Global right ventricle has mildly reduced systolic function.The right ventricular size is normal. No increase in right  ventricular wall thickness. 6. Left atrial size was severely dilated. 7. Right atrial size was moderately dilated. 8. The mitral valve is abnormal. Mild mitral valve regurgitation. 9. The tricuspid valve is grossly normal. Tricuspid valve regurgitation moderate. 10. Aortic valve area, by VTI measures 1.60 cm. 11. Aortic valve mean gradient measures 8.0 mmHg. 12. Aortic valve peak gradient measures 13.1 mmHg. 13. The aortic valve is tricuspid. Aortic valve regurgitation is trivial. Mild aortic valve stenosis. 14. The pulmonic valve was grossly normal. Pulmonic valve regurgitation is mild. 15. Moderately elevated pulmonary artery systolic pressure. 16. The inferior vena cava is dilated in size with >50% respiratory variability, suggesting right atrial pressure of 8 mmHg. 17. The average  left ventricular global longitudinal strain is -10.7 %. 18. A ICD is visualized.   ASSESSMENT AND PLAN:  1.  Acute on chronic mixed CHF: He has lost 4 pounds over 3 days with increased dose of Bumex to 2 mg twice daily.  Review of labs revealed that his BNP was greater than 1000 on 05/18/2019.  I will continue his Bumex 2 mg twice daily for now repeat his labs.  I have given him instructions to weigh himself daily and monitor his weight for downward trend.  He is also given a list "the salty 6" list so that he is aware of hidden salt in foods that he may be eating.  He states he could probably do better on his diet.  He states he was unable to tolerate Lasix as it caused him to have some syncope.  Depending upon his response to Bumex, he may need to be on higher doses on a daily basis instead of temporary higher doses when he is fluid overloaded, to prevent decompensation.  Will defer to his primary cardiologist for further recommendations concerning maintenance of diuretics.  2.  CAD: History of CABG in 1990.  He offers no complaints of chest discomfort.  Breathing status has improved.  Continue secondary  prevention.  3.  Atrial fibrillation: Evaluation of heart rate by auscultation revealed that the patient was in sinus rhythm.  He will continue Eliquis 2.5 mg twice daily as directed.  Heart rate is well controlled on carvedilol 3.125 mg twice daily.  4.  Hypertension: Blood pressure is well controlled currently.  No changes in regimen at this time.  5.  Hypercholesterolemia: Continue simvastatin 40 mg daily.  Follow-up labs can be completed in the next 3 months concerning his status with a goal of LDL less than 70 in the setting of CAD.   Current medicines are reviewed at length with the patient today.    Labs/ tests ordered today include: BMET, BN P  Phill Myron. [place] Pugh, ANP, AACC   05/21/2019 4:51 PM    [facility] [address] Office [phone] Fax [phone]  Notice: This dictation was prepared with Dragon dictation along with smaller phrase technology. Any transcriptional errors that result from this process are unintentional and may not be corrected upon review.

## 2019-05-21 ENCOUNTER — Other Ambulatory Visit: Payer: Self-pay

## 2019-05-21 ENCOUNTER — Ambulatory Visit (INDEPENDENT_AMBULATORY_CARE_PROVIDER_SITE_OTHER): Payer: Medicare Other | Admitting: Adult Health

## 2019-05-21 ENCOUNTER — Encounter: Payer: Self-pay | Admitting: Adult Health

## 2019-05-21 VITALS — BP 124/72 | HR 74 | Temp 98.2°F | Ht 68.0 in | Wt 213.4 lb

## 2019-05-21 DIAGNOSIS — I480 Paroxysmal atrial fibrillation: Secondary | ICD-10-CM

## 2019-05-21 DIAGNOSIS — I255 Ischemic cardiomyopathy: Secondary | ICD-10-CM

## 2019-05-21 DIAGNOSIS — I5023 Acute on chronic systolic (congestive) heart failure: Secondary | ICD-10-CM | POA: Diagnosis not present

## 2019-05-21 DIAGNOSIS — I10 Essential (primary) hypertension: Secondary | ICD-10-CM

## 2019-05-21 DIAGNOSIS — Z79899 Other long term (current) drug therapy: Secondary | ICD-10-CM | POA: Diagnosis not present

## 2019-05-21 DIAGNOSIS — I2510 Atherosclerotic heart disease of native coronary artery without angina pectoris: Secondary | ICD-10-CM

## 2019-05-21 DIAGNOSIS — E7800 Pure hypercholesterolemia, unspecified: Secondary | ICD-10-CM

## 2019-05-21 MED ORDER — BUMETANIDE 1 MG PO TABS: 2.0000 mg | ORAL_TABLET | ORAL | 2 refills | Status: DC

## 2019-05-21 NOTE — Patient Instructions (Addendum)
Medication Instructions:  Continue current medications  If you need a refill on your cardiac medications before your next appointment, please call your pharmacy.  Labwork: BMP and BNP HERE IN OUR OFFICE AT [facility]  You will NOT need to fast   If you have labs (blood work) drawn today and your tests are completely normal, you will receive your results only by: Marland Kitchen MyChart Message (if you have MyChart) OR . A paper copy in the mail If you have any lab test that is abnormal or we need to change your treatment, we will call you to review the results.  Testing/Procedures: None Ordered  PLEASE READ AND FOLLOW SALTY 6 ATTACHED  Reduce your risk of getting COVID-19 With your heart disease it is especially important for people at increased risk of severe illness from COVID-19, and those who live with them, to protect themselves from getting COVID-19. The best way to protect yourself and to help reduce the spread of the virus that causes COVID-19 is to: Marland Kitchen Limit your interactions with other people as much as possible. . Take precautions to prevent getting COVID-19 when you do interact with others. If you start feeling sick and think you may have COVID-19, get in touch with your healthcare provider within 24 hours.  Follow-Up: Keep appointment with Dr Gardiner Rhyme in January 13th  At [facility], you and your health needs are our priority.  As part of our continuing mission to provide you with exceptional heart care, we have created designated Provider Care Teams.  These Care Teams include your primary Cardiologist (physician) and Advanced Practice Providers (APPs -  Physician Assistants and Nurse Practitioners) who all work together to provide you with the care you need, when you need it.  Thank you for choosing [facility]!!     Happy Holidays!!

## 2019-05-22 ENCOUNTER — Emergency Department (HOSPITAL_COMMUNITY): Payer: Medicare Other

## 2019-05-22 ENCOUNTER — Observation Stay (HOSPITAL_COMMUNITY)
Admission: EM | Admit: 2019-05-22 | Discharge: 2019-05-23 | Disposition: A | Discharge status: Home / Self Care | Payer: Medicare Other | Attending: Cardiology | Admitting: Cardiology

## 2019-05-22 ENCOUNTER — Encounter (HOSPITAL_COMMUNITY): Payer: Self-pay | Admitting: Emergency Medicine

## 2019-05-22 ENCOUNTER — Other Ambulatory Visit: Payer: Self-pay

## 2019-05-22 DIAGNOSIS — M1A9XX1 Chronic gout, unspecified, with tophus (tophi): Secondary | ICD-10-CM | POA: Insufficient documentation

## 2019-05-22 DIAGNOSIS — I5023 Acute on chronic systolic (congestive) heart failure: Secondary | ICD-10-CM | POA: Diagnosis not present

## 2019-05-22 DIAGNOSIS — N184 Chronic kidney disease, stage 4 (severe): Secondary | ICD-10-CM | POA: Insufficient documentation

## 2019-05-22 DIAGNOSIS — Z79899 Other long term (current) drug therapy: Secondary | ICD-10-CM | POA: Diagnosis not present

## 2019-05-22 DIAGNOSIS — R609 Edema, unspecified: Secondary | ICD-10-CM | POA: Insufficient documentation

## 2019-05-22 DIAGNOSIS — Z950 Presence of cardiac pacemaker: Secondary | ICD-10-CM | POA: Insufficient documentation

## 2019-05-22 DIAGNOSIS — K224 Dyskinesia of esophagus: Secondary | ICD-10-CM

## 2019-05-22 DIAGNOSIS — I2510 Atherosclerotic heart disease of native coronary artery without angina pectoris: Secondary | ICD-10-CM | POA: Diagnosis not present

## 2019-05-22 DIAGNOSIS — I130 Hypertensive heart and chronic kidney disease with heart failure and stage 1 through stage 4 chronic kidney disease, or unspecified chronic kidney disease: Secondary | ICD-10-CM | POA: Diagnosis not present

## 2019-05-22 DIAGNOSIS — I5042 Chronic combined systolic (congestive) and diastolic (congestive) heart failure: Secondary | ICD-10-CM | POA: Diagnosis not present

## 2019-05-22 DIAGNOSIS — Z7982 Long term (current) use of aspirin: Secondary | ICD-10-CM | POA: Insufficient documentation

## 2019-05-22 DIAGNOSIS — Z20828 Contact with and (suspected) exposure to other viral communicable diseases: Secondary | ICD-10-CM | POA: Insufficient documentation

## 2019-05-22 DIAGNOSIS — I255 Ischemic cardiomyopathy: Secondary | ICD-10-CM | POA: Diagnosis not present

## 2019-05-22 DIAGNOSIS — N179 Acute kidney failure, unspecified: Secondary | ICD-10-CM | POA: Diagnosis not present

## 2019-05-22 DIAGNOSIS — I420 Dilated cardiomyopathy: Secondary | ICD-10-CM | POA: Insufficient documentation

## 2019-05-22 DIAGNOSIS — Z951 Presence of aortocoronary bypass graft: Secondary | ICD-10-CM | POA: Insufficient documentation

## 2019-05-22 DIAGNOSIS — I5043 Acute on chronic combined systolic (congestive) and diastolic (congestive) heart failure: Secondary | ICD-10-CM | POA: Insufficient documentation

## 2019-05-22 DIAGNOSIS — D696 Thrombocytopenia, unspecified: Secondary | ICD-10-CM | POA: Insufficient documentation

## 2019-05-22 DIAGNOSIS — I480 Paroxysmal atrial fibrillation: Secondary | ICD-10-CM | POA: Diagnosis not present

## 2019-05-22 DIAGNOSIS — Z7901 Long term (current) use of anticoagulants: Secondary | ICD-10-CM | POA: Diagnosis not present

## 2019-05-22 DIAGNOSIS — I428 Other cardiomyopathies: Secondary | ICD-10-CM

## 2019-05-22 DIAGNOSIS — E782 Mixed hyperlipidemia: Secondary | ICD-10-CM | POA: Insufficient documentation

## 2019-05-22 DIAGNOSIS — K220 Achalasia of cardia: Secondary | ICD-10-CM

## 2019-05-22 DIAGNOSIS — N189 Chronic kidney disease, unspecified: Secondary | ICD-10-CM

## 2019-05-22 HISTORY — DX: Thrombocytopenia, unspecified: D69.6

## 2019-05-22 HISTORY — DX: Atherosclerotic heart disease of native coronary artery without angina pectoris: I25.10

## 2019-05-22 HISTORY — DX: Presence of automatic (implantable) cardiac defibrillator: Z95.810

## 2019-05-22 HISTORY — DX: Paroxysmal atrial fibrillation: I48.0

## 2019-05-22 HISTORY — DX: Chronic kidney disease, stage 4 (severe): N18.4

## 2019-05-22 HISTORY — DX: Chronic combined systolic (congestive) and diastolic (congestive) heart failure: I50.42

## 2019-05-22 LAB — SARS CORONAVIRUS 2 (TAT 6-24 HRS): SARS Coronavirus 2: NEGATIVE

## 2019-05-22 LAB — CBC
HCT: 36.9 % — ABNORMAL LOW (ref 39.0–52.0)
Hemoglobin: 12.2 g/dL — ABNORMAL LOW (ref 13.0–17.0)
MCH: 31.4 pg (ref 26.0–34.0)
MCHC: 33.1 g/dL (ref 30.0–36.0)
MCV: 95.1 fL (ref 80.0–100.0)
Platelets: 91 10*3/uL — ABNORMAL LOW (ref 150–400)
RBC: 3.88 MIL/uL — ABNORMAL LOW (ref 4.22–5.81)
RDW: 14 % (ref 11.5–15.5)
WBC: 6.4 10*3/uL (ref 4.0–10.5)
nRBC: 0 % (ref 0.0–0.2)

## 2019-05-22 LAB — BASIC METABOLIC PANEL
Anion gap: 13 (ref 5–15)
BUN/Creatinine Ratio: 29 — ABNORMAL HIGH (ref 10–24)
BUN: 65 mg/dL — ABNORMAL HIGH (ref 8–27)
BUN: 70 mg/dL — ABNORMAL HIGH (ref 8–23)
CO2: 22 mmol/L (ref 20–29)
CO2: 24 mmol/L (ref 22–32)
Calcium: 9.1 mg/dL (ref 8.6–10.2)
Calcium: 9 mg/dL (ref 8.9–10.3)
Chloride: 104 mmol/L (ref 96–106)
Chloride: 103 mmol/L (ref 98–111)
Creatinine, Ser: 2.24 mg/dL — ABNORMAL HIGH (ref 0.76–1.27)
Creatinine, Ser: 2.41 mg/dL — ABNORMAL HIGH (ref 0.61–1.24)
GFR calc Af Amer: 30 mL/min/{1.73_m2} — ABNORMAL LOW (ref >59)
GFR calc Af Amer: 27 mL/min — ABNORMAL LOW (ref >60)
GFR calc non Af Amer: 26 mL/min/{1.73_m2} — ABNORMAL LOW (ref >59)
GFR calc non Af Amer: 23 mL/min — ABNORMAL LOW (ref >60)
Glucose, Bld: 111 mg/dL — ABNORMAL HIGH (ref 70–99)
Glucose: 97 mg/dL (ref 65–99)
Potassium: 4.1 mmol/L (ref 3.5–5.2)
Potassium: 3.8 mmol/L (ref 3.5–5.1)
Sodium: 143 mmol/L (ref 134–144)
Sodium: 140 mmol/L (ref 135–145)

## 2019-05-22 LAB — BRAIN NATRIURETIC PEPTIDE: B Natriuretic Peptide: 735 pg/mL — ABNORMAL HIGH (ref 0.0–100.0)

## 2019-05-22 LAB — PRO B NATRIURETIC PEPTIDE: NT-Pro BNP: 6191 pg/mL — ABNORMAL HIGH (ref 0–486)

## 2019-05-22 MED ORDER — SODIUM CHLORIDE 0.9% FLUSH
3.0000 mL | Freq: Once | INTRAVENOUS | Status: AC
  Administered 2019-05-22: 3 mL via INTRAVENOUS

## 2019-05-22 MED ORDER — ALLOPURINOL 100 MG PO TABS
100.0000 mg | ORAL_TABLET | Freq: Every day | ORAL | Status: DC
  Administered 2019-05-22 – 2019-05-23 (×2): 100 mg via ORAL
  Filled 2019-05-22 (×2): qty 1

## 2019-05-22 MED ORDER — TRAZODONE HCL 50 MG PO TABS
25.0000 mg | ORAL_TABLET | Freq: Every evening | ORAL | Status: DC | PRN
  Administered 2019-05-22: 22:00:00 25 mg via ORAL
  Filled 2019-05-22 (×2): qty 1

## 2019-05-22 MED ORDER — ACETAMINOPHEN 325 MG PO TABS
650.0000 mg | ORAL_TABLET | ORAL | Status: DC | PRN
  Administered 2019-05-22 – 2019-05-23 (×2): 650 mg via ORAL
  Filled 2019-05-22 (×4): qty 2

## 2019-05-22 MED ORDER — VITAMIN D (ERGOCALCIFEROL) 1.25 MG (50000 UNIT) PO CAPS
50000.0000 [IU] | ORAL_CAPSULE | ORAL | Status: DC
  Filled 2019-05-22 (×2): qty 1

## 2019-05-22 MED ORDER — SIMVASTATIN 20 MG PO TABS
40.0000 mg | ORAL_TABLET | Freq: Every day | ORAL | Status: DC
  Administered 2019-05-22: 40 mg via ORAL
  Filled 2019-05-22: qty 2

## 2019-05-22 MED ORDER — ONDANSETRON HCL 4 MG/2ML IJ SOLN: 4.0000 mg | Freq: Four times a day (QID) | INTRAMUSCULAR | Status: DC | PRN

## 2019-05-22 MED ORDER — PRAMIPEXOLE DIHYDROCHLORIDE 0.25 MG PO TABS
0.2500 mg | ORAL_TABLET | Freq: Two times a day (BID) | ORAL | Status: DC
  Administered 2019-05-22 – 2019-05-23 (×2): 0.25 mg via ORAL
  Filled 2019-05-22 (×4): qty 1

## 2019-05-22 MED ORDER — ASPIRIN EC 81 MG PO TBEC
81.0000 mg | DELAYED_RELEASE_TABLET | Freq: Every day | ORAL | Status: DC
  Administered 2019-05-22 – 2019-05-23 (×2): 81 mg via ORAL
  Filled 2019-05-22 (×2): qty 1

## 2019-05-22 MED ORDER — FOLIC ACID-VIT B6-VIT B12 2.5-25-1 MG PO TABS
1.0000 | ORAL_TABLET | Freq: Every day | ORAL | Status: DC
  Filled 2019-05-22 (×2): qty 1

## 2019-05-22 MED ORDER — NITROGLYCERIN 0.4 MG SL SUBL: 0.4000 mg | SUBLINGUAL_TABLET | SUBLINGUAL | Status: DC | PRN

## 2019-05-22 MED ORDER — TRAMADOL HCL 50 MG PO TABS
50.0000 mg | ORAL_TABLET | Freq: Once | ORAL | Status: AC
  Administered 2019-05-22: 23:00:00 50 mg via ORAL
  Filled 2019-05-22: qty 1

## 2019-05-22 MED ORDER — CARVEDILOL 3.125 MG PO TABS
3.1250 mg | ORAL_TABLET | Freq: Two times a day (BID) | ORAL | Status: DC
  Administered 2019-05-22 – 2019-05-23 (×2): 3.125 mg via ORAL
  Filled 2019-05-22 (×2): qty 1

## 2019-05-22 MED ORDER — FA-PYRIDOXINE-CYANOCOBALAMIN 2.5-25-2 MG PO TABS
1.0000 | ORAL_TABLET | Freq: Every day | ORAL | Status: DC
  Administered 2019-05-22 – 2019-05-23 (×2): 1 via ORAL
  Filled 2019-05-22 (×3): qty 1

## 2019-05-22 MED ORDER — APIXABAN 2.5 MG PO TABS
2.5000 mg | ORAL_TABLET | Freq: Two times a day (BID) | ORAL | Status: DC
  Administered 2019-05-22 – 2019-05-23 (×2): 2.5 mg via ORAL
  Filled 2019-05-22 (×5): qty 1

## 2019-05-22 NOTE — ED Triage Notes (Addendum)
Pt denies any complaints at present.  States he was gaining weight and did have SOB but saw Dr and taking fluid pill and now feels better.  Pt saw his doctor again yesterday and had labs completed and received call to come to ED today.  Cardiology called prior to pt's arrival and wanted to be notified when pt arrived which was done..  [phone]

## 2019-05-22 NOTE — ED Provider Notes (Signed)
[facility] EMERGENCY DEPARTMENT Provider Note   CSN: [id number] Arrival date & time: 05/22/19  [id number]     History Chief Complaint  Patient presents with  . Congestive Heart Failure    Ricardo Payne is a 85 y.o. male.  HPI  85 year old male presents to the emergency department after being called by his cardiology group.  He was feeling short of breath last week and went in for a visit and was increased from once a day Bumex to 4 times a day.  The patient states that his leg swelling and pain and shortness of breath have all improved.  He is not short of breath.  He never had chest pain or cough.  The swelling in his legs is at a more normal level for him though mildly edematous.  Was told his kidney function had worsened and he may need IV Lasix so he was sent here.  Past Medical History:  Diagnosis Date  . Acquired thrombophilia ([place])   . Age-related cataract of both eyes   . ASHD (arteriosclerotic heart disease)   . Atrial fibrillation ([place])   . Benign prostatic hyperplasia   . Cerebellar stroke ([place]) 07/02/2016  . Chronic congestive heart failure ([place])   . Chronic gout without tophus   . Chronic kidney disease (CKD), stage III (moderate)    unspecified whether stage 3a or 3b  . Dilated cardiomyopathy ([place])   . Essential hypertension   . Ischemic cardiomyopathy   . Mixed hyperlipidemia   . Presence of permanent cardiac pacemaker   . Restless legs   . Secondary pulmonary arterial hypertension ([place])   . Vitamin D deficiency     Patient Active Problem List   Diagnosis Date Noted  . Acute on chronic systolic (congestive) heart failure ([place]) 05/22/2019  . Nonischemic cardiomyopathy ([place]) 04/18/2019  . ICD (implantable cardioverter-defibrillator) in place 04/18/2019    Past Surgical History:  Procedure Laterality Date  . CHOLECYSTECTOMY    . CORONARY ARTERY BYPASS GRAFT    . PACEMAKER IMPLANT  10/2018       Family History  Problem Relation  Age of Onset  . Hypertension Mother   . Suicidality Mother   . Valvular heart disease Father   . Heart disease Father   . Heart attack Brother     Social History   Tobacco Use  . Smoking status: Former Smoker    Quit date: 1988    Years since quitting: 32.9  . Smokeless tobacco: Never Used  Substance Use Topics  . Alcohol use: Yes    Comment: rare  . Drug use: Never    Home Medications Prior to Admission medications   Medication Sig Start Date End Date Taking? Authorizing Provider  allopurinol (ZYLOPRIM) 100 MG tablet Take 100 mg by mouth daily.   Yes [provider]  apixaban (ELIQUIS) 2.5 MG TABS tablet Take 2.5 mg by mouth 2 (two) times daily.   Yes [provider]  aspirin EC 81 MG tablet Take 81 mg by mouth daily.   Yes [provider]  bumetanide (BUMEX) 1 MG tablet Take 2 tablets (2 mg total) by mouth as directed. 05/21/19  Yes Lendon Colonel, NP  carvedilol (COREG) 6.25 MG tablet Take 3.125 mg by mouth 2 (two) times daily with a meal. 1/ 2  tablet (3.125mg ) twice daily   Yes [provider]  DUREZOL 0.05 % EMUL Place 1 drop into the right eye 2 (two) times daily. Until Wed, then  1 drop daily 05/06/19  Yes [provider]  ergocalciferol (VITAMIN D2) 1.25 MG (50000 UT) capsule Take 50,000 Units by mouth once a week. Sunday   Yes [provider]  Folic Acid-Vit Q000111Q 123456 (FOLBEE) 2.5-25-1 MG TABS tablet Take 1 tablet by mouth daily.   Yes [provider]  Niacin (VITAMIN B-3 PO) Take 1,000-2,000 mg by mouth daily.    Yes [provider]  pramipexole (MIRAPEX) 0.25 MG tablet Take 0.25 mg by mouth 2 (two) times daily.   Yes [provider]  simvastatin (ZOCOR) 40 MG tablet Take 40 mg by mouth daily at 6 PM.   Yes [provider]    Allergies    Patient has no known allergies.  Review of Systems   Review of Systems  Constitutional: Negative for fever.  Respiratory: Positive for  shortness of breath. Negative for cough.   Cardiovascular: Positive for leg swelling. Negative for chest pain.  All other systems reviewed and are negative.   Physical Exam Updated Vital Signs BP (!) 141/80 (BP Location: Left Arm)   Pulse 75   Temp 98 F (36.7 C) (Oral)   Resp 18   Ht 5\' 8"  (1.727 m)   Wt 93.9 kg   SpO2 100%   BMI 31.47 kg/m   Physical Exam Vitals and nursing note reviewed.  Constitutional:      General: He is not in acute distress.    Appearance: He is well-developed. He is obese. He is not ill-appearing or diaphoretic.  HENT:     Head: Normocephalic and atraumatic.     Right Ear: External ear normal.     Left Ear: External ear normal.     Nose: Nose normal.  Eyes:     General:        Right eye: No discharge.        Left eye: No discharge.  Cardiovascular:     Rate and Rhythm: Normal rate and regular rhythm.     Heart sounds: Normal heart sounds.  Pulmonary:     Effort: Pulmonary effort is normal. No tachypnea or accessory muscle usage.     Breath sounds: Normal breath sounds.  Abdominal:     Palpations: Abdomen is soft.     Tenderness: There is no abdominal tenderness.  Musculoskeletal:     Cervical back: Neck supple.     Right lower leg: Edema present.     Left lower leg: Edema present.     Comments: Venous stasis to lower extremities with mild, symmetric pitting edema  Skin:    General: Skin is warm and dry.  Neurological:     Mental Status: He is alert.  Psychiatric:        Mood and Affect: Mood is not anxious.     ED Results / Procedures / Treatments   Labs (all labs ordered are listed, but only abnormal results are displayed) Labs Reviewed  BASIC METABOLIC PANEL - Abnormal; Notable for the following components:      Result Value   Glucose, Bld 111 (*)    BUN 70 (*)    Creatinine, Ser 2.41 (*)    GFR calc non Af Amer 23 (*)    GFR calc Af Amer 27 (*)    All other components within normal limits  CBC - Abnormal; Notable for the  following components:   RBC 3.88 (*)    Hemoglobin 12.2 (*)    HCT 36.9 (*)    Platelets 91 (*)  All other components within normal limits  BRAIN NATRIURETIC PEPTIDE - Abnormal; Notable for the following components:   B Natriuretic Peptide 735.0 (*)    All other components within normal limits  SARS CORONAVIRUS 2 (TAT 6-24 HRS)    EKG EKG Interpretation  Date/Time:  Tuesday May 22 2019 09:36:06 EST Ventricular Rate:  72 PR Interval:  220 QRS Duration: 140 QT Interval:  444 QTC Calculation: 486 R Axis:   -25 Text Interpretation: Sinus rhythm with 1st degree A-V block with occasional Premature ventricular complexes Non-specific intra-ventricular conduction block Minimal voltage criteria for LVH, may be normal variant ( Cornell product ) T wave abnormality, consider lateral ischemia Abnormal ECG No old tracing to compare Confirmed by Sherwood Gambler [phone]) on 05/22/2019 9:42:21 AM   Radiology DG Chest 2 View  Result Date: 05/22/2019 CLINICAL DATA:  Shortness of breath and weight gain EXAM: CHEST - 2 VIEW COMPARISON:  None. FINDINGS: There is no appreciable edema or consolidation. There is mild atelectasis in the medial right base. Heart is mildly enlarged. There is a pacemaker with lead tip attached to the right ventricle. Pulmonary vascularity is normal. There is aortic atherosclerosis. No adenopathy. No bone lesions. There are surgical clips in the upper abdomen. IMPRESSION: No edema or consolidation.  Medial right base atelectasis noted. Cardiomegaly with pacemaker lead attached to right ventricle. Pulmonary vascularity within normal limits. Aortic Atherosclerosis (ICD10-I70.0). Electronically Signed   By: Lowella Grip III M.D.   On: 05/22/2019 10:12    Procedures Procedures (including critical care time)  Medications Ordered in ED Medications  allopurinol (ZYLOPRIM) tablet 100 mg (100 mg Oral Given 05/22/19 1343)  aspirin EC tablet 81 mg (81 mg Oral Given 05/22/19  1344)  carvedilol (COREG) tablet 3.125 mg (has no administration in time range)  simvastatin (ZOCOR) tablet 40 mg (has no administration in time range)  apixaban (ELIQUIS) tablet 2.5 mg (2.5 mg Oral Not Given 123456 Q000111Q)  Folic Acid-Vit Q000111Q 123456 (FOLBEE) tablet 1 tablet (has no administration in time range)  pramipexole (MIRAPEX) tablet 0.25 mg (0.25 mg Oral Not Given 05/22/19 1343)  Vitamin D (Ergocalciferol) (DRISDOL) capsule 50,000 Units (50,000 Units Oral Not Given 05/22/19 1350)  nitroGLYCERIN (NITROSTAT) SL tablet 0.4 mg (has no administration in time range)  acetaminophen (TYLENOL) tablet 650 mg (has no administration in time range)  ondansetron (ZOFRAN) injection 4 mg (has no administration in time range)  sodium chloride flush (NS) 0.9 % injection 3 mL (3 mLs Intravenous Given 05/22/19 1142)    ED Course  I have reviewed the triage vital signs and the nursing notes.  Pertinent labs & imaging results that were available during my care of the patient were reviewed by me and considered in my medical decision making (see chart for details).    MDM Rules/Calculators/A&P                      Patient has no symptoms here.  Creatinine is bumped.  Cardiology is aware of patient and would like to admit him. Final Clinical Impression(s) / ED Diagnoses Final diagnoses:  Acute kidney injury [facility])    Rx / DC Orders ED Discharge Orders         Ordered    Ambulatory referral to Nephrology    Comments: Nephrology evaluation as outpatient with Dr. Carolin Sicks   05/22/19 [place], [place], MD 05/22/19 1537

## 2019-05-22 NOTE — H&P (Addendum)
Cardiology Admission History and Physical:   Patient ID: [name] MRN: [id number]; DOB: Apr 27, 1931   Admission date: 05/22/2019  Primary Care Provider: [name], FNP Primary Cardiologist: Donato Heinz, MD  Primary Electrophysiologist:  Cristopher [place], MD   Chief Complaint:  SOB  Patient Profile:   Ricardo Payne is a 85 y.o. male with with a hx of CAD status post CABG x1 in 1990, ischemic cardiomyopathy status post ICD 02/2018, atrial fibrillation, hypertension, hyperlipidemia, CVA 07/2016, CKD stage III and chronic thrombocytopenia advised to come to hospital for elevated BNP.   He had moved from [place] to [place] in September 2020. LVEF of 25-30% by echo 07/2018 in [place].  TTE here in 04/2019 showed improvement in EF to 40 to 45%.  Not on an ACE/ARB/Entresto due to CKD (baseline creatinine around 2.0). Hx of hyperkalemia on spironolactone.   History of Present Illness:   Mr. Ogarro seen by Dr. Gardiner Rhyme 12/18  with CHF exacerbation. Declined admission. Increased Bumex to 2mg  BID. Seen by Jory Sims yesterday>> improved breathing with weight loss of 4lb. However BNP came back higher at 6191 (proBNP)  from 1044 (BNP) and worsen BUN/Scr to 65/2.24 from 51/1.75 compared to labs 3 days ago. Instructed to come to ER.   CXR without edema or consolidation. He took his Bumax prior to arrival. BUN/SCr worsen further to 70/2.41. Hgb stable at 12.2. Pending BMET.   He reports feeling better since increase dose of Bumax. Now able to lay flat with reduce edema.   Heart Pathway Score:     Past Medical History:  Diagnosis Date  . Acquired thrombophilia ([place])   . Age-related cataract of both eyes   . ASHD (arteriosclerotic heart disease)   . Atrial fibrillation ([place])   . Benign prostatic hyperplasia   . Cerebellar stroke ([place]) 07/02/2016  . Chronic congestive heart failure ([place])   . Chronic gout without tophus   . Chronic kidney disease (CKD), stage III  (moderate)    unspecified whether stage 3a or 3b  . Dilated cardiomyopathy ([place])   . Essential hypertension   . Ischemic cardiomyopathy   . Mixed hyperlipidemia   . Restless legs   . Secondary pulmonary arterial hypertension ([place])   . Vitamin D deficiency     Past Surgical History:  Procedure Laterality Date  . CORONARY ARTERY BYPASS GRAFT    . PACEMAKER IMPLANT  10/2018     Medications Prior to Admission: Prior to Admission medications   Medication Sig Start Date End Date Taking? Authorizing Provider  allopurinol (ZYLOPRIM) 100 MG tablet Take 100 mg by mouth daily.    [provider]  apixaban (ELIQUIS) 2.5 MG TABS tablet Take 2.5 mg by mouth 2 (two) times daily.    [provider]  aspirin EC 81 MG tablet Take 81 mg by mouth daily.    [provider]  bumetanide (BUMEX) 1 MG tablet Take 2 tablets (2 mg total) by mouth as directed. 05/21/19   Lendon Colonel, NP  carvedilol (COREG) 6.25 MG tablet Take 3.125 mg by mouth 2 (two) times daily with a meal.    [provider]  ergocalciferol (VITAMIN D2) 1.25 MG (50000 UT) capsule Take 50,000 Units by mouth once a week.    [provider]  Folic Acid-Vit Q000111Q 123456 (FOLBEE) 2.5-25-1 MG TABS tablet Take 1 tablet by mouth daily.    [provider]  Niacin (VITAMIN B-3 PO) Take 1,000-2,000 mg by mouth 2 (two) times daily.  [provider]  pramipexole (MIRAPEX) 0.25 MG tablet Take 0.25 mg by mouth 2 (two) times daily.    [provider]  simvastatin (ZOCOR) 40 MG tablet Take 40 mg by mouth daily at 6 PM.    [provider]     Allergies:   No Known Allergies  Social History:   Social History   Socioeconomic History  . Marital status: Widowed    Spouse name: Not on file  . Number of children: 2  . Years of education: Not on file  . Highest education level: Not on file  Occupational History  . Not on file  Tobacco Use  . Smoking status: Former  Smoker    Quit date: 1988    Years since quitting: 32.9  . Smokeless tobacco: Never Used  Substance and Sexual Activity  . Alcohol use: Yes    Comment: rare  . Drug use: Never  . Sexual activity: Not on file  Other Topics Concern  . Not on file  Social History Narrative  . Not on file   Social Determinants of Health   Financial Resource Strain:   . Difficulty of Paying Living Expenses: Not on file  Food Insecurity:   . Worried About Charity fundraiser in the Last Year: Not on file  . Ran Out of Food in the Last Year: Not on file  Transportation Needs:   . Lack of Transportation (Medical): Not on file  . Lack of Transportation (Non-Medical): Not on file  Physical Activity:   . Days of Exercise per Week: Not on file  . Minutes of Exercise per Session: Not on file  Stress:   . Feeling of Stress : Not on file  Social Connections:   . Frequency of Communication with Friends and Family: Not on file  . Frequency of Social Gatherings with Friends and Family: Not on file  . Attends Religious Services: Not on file  . Active Member of Clubs or Organizations: Not on file  . Attends Archivist Meetings: Not on file  . Marital Status: Not on file  Intimate Partner Violence:   . Fear of Current or Ex-Partner: Not on file  . Emotionally Abused: Not on file  . Physically Abused: Not on file  . Sexually Abused: Not on file    Family History:   The patient's family history includes Heart attack in his brother; Heart disease in his father; Hypertension in his mother; Suicidality in his mother; Valvular heart disease in his father.    ROS:  Please see the history of present illness.  All other ROS reviewed and negative.     Physical Exam/Data:   Vitals:   05/22/19 0932 05/22/19 0945 05/22/19 1046  BP: 133/64 (!) 122/94 112/81  Pulse: 75 76 72  Resp: 16 20 (!) 21  Temp: 97.9 F (36.6 C)    TempSrc: Oral    SpO2: 98% 99% 99%   No intake or output data in the 24 hours  ending 05/22/19 1056 Last 3 Weights 05/21/2019 05/18/2019 04/18/2019  Weight (lbs) 213 lb 6.4 oz 217 lb 3.2 oz 212 lb 12.8 oz  Weight (kg) 96.798 kg 98.521 kg 96.525 kg     There is no height or weight on file to calculate BMI.  General:  Well nourished, well developed, in no acute distress HEENT: normal Lymph: no adenopathy Neck: no JVD Endocrine:  No thryomegaly Vascular: No carotid bruits; FA pulses 2+ bilaterally without bruits  Cardiac:  normal  S1, S2; RRR; no murmur Lungs:  clear to auscultation bilaterally, no wheezing, rhonchi or rales  Abd: soft, nontender, no hepatomegaly  Ext: Trace BL LE edema Musculoskeletal:  No deformities, BUE and BLE strength normal and equal Skin: warm and dry  Neuro:  CNs 2-12 intact, no focal abnormalities noted Psych:  Normal affect    EKG:  The ECG that was done Today  was personally reviewed and demonstrates SR at rate of 72 bpm, PVCs,  chronic LBBB, prolonged PR interval   Relevant CV Studies:  Echo 04/02/2019  1. Left ventricular ejection fraction, by visual estimation, is 40 to 45%. The left ventricle has moderately decreased function. Left ventricular septal wall thickness was normal. Mildly increased left ventricular posterior wall thickness. There is  borderline left ventricular hypertrophy.  2. Inferior and inferoseptal akinesis and thinning.  3. Elevated left atrial and left ventricular end-diastolic pressures.  4. Left ventricular diastolic parameters are consistent with Grade III diastolic dysfunction (restrictive).  5. Global right ventricle has mildly reduced systolic function.The right ventricular size is normal. No increase in right ventricular wall thickness.  6. Left atrial size was severely dilated.  7. Right atrial size was moderately dilated.  8. The mitral valve is abnormal. Mild mitral valve regurgitation.  9. The tricuspid valve is grossly normal. Tricuspid valve regurgitation moderate. 10. Aortic valve area, by VTI  measures 1.60 cm. 11. Aortic valve mean gradient measures 8.0 mmHg. 12. Aortic valve peak gradient measures 13.1 mmHg. 13. The aortic valve is tricuspid. Aortic valve regurgitation is trivial. Mild aortic valve stenosis. 14. The pulmonic valve was grossly normal. Pulmonic valve regurgitation is mild. 15. Moderately elevated pulmonary artery systolic pressure. 16. The inferior vena cava is dilated in size with >50% respiratory variability, suggesting right atrial pressure of 8 mmHg. 17. The average left ventricular global longitudinal strain is -10.7 %. 18. A ICD is visualized.  Laboratory Data:  High Sensitivity Troponin:  No results for input(s): TROPONINIHS in the last 720 hours.    Chemistry Recent Labs  Lab 05/21/19 1535 05/22/19 0951  NA 143 140  K 4.1 3.8  CL 104 103  CO2 22 24  GLUCOSE 97 111*  BUN 65* 70*  CREATININE 2.24* 2.41*  CALCIUM 9.1 9.0  GFRNONAA 26* 23*  GFRAA 30* 27*  ANIONGAP  --  13    Recent Labs  Lab 05/18/19 1454  PROT 7.0  ALBUMIN 4.6  AST 27  ALT 24  ALKPHOS 161*  BILITOT 1.3*   Hematology Recent Labs  Lab 05/18/19 1454 05/22/19 0951  WBC 6.1 6.4  RBC 3.66* 3.88*  HGB 11.7* 12.2*  HCT 34.3* 36.9*  MCV 94 95.1  MCH 32.0 31.4  MCHC 34.1 33.1  RDW 13.1 14.0  PLT 84* 91*   BNP Recent Labs  Lab 05/18/19 1454 05/21/19 1535  BNP 1,044.7*  --   PROBNP  --  6,191*    Radiology/Studies:  DG Chest 2 View  Result Date: 05/22/2019 CLINICAL DATA:  Shortness of breath and weight gain EXAM: CHEST - 2 VIEW COMPARISON:  None. FINDINGS: There is no appreciable edema or consolidation. There is mild atelectasis in the medial right base. Heart is mildly enlarged. There is a pacemaker with lead tip attached to the right ventricle. Pulmonary vascularity is normal. There is aortic atherosclerosis. No adenopathy. No bone lesions. There are surgical clips in the upper abdomen. IMPRESSION: No edema or consolidation.  Medial right base atelectasis  noted. Cardiomegaly with pacemaker lead attached to  right ventricle. Pulmonary vascularity within normal limits. Aortic Atherosclerosis (ICD10-I70.0). Electronically Signed   By: Lowella Grip III M.D.   On: 05/22/2019 10:12     Assessment and Plan:   1. Chronic systolic CHF - He is feeling better after increase Bumax last week. He is able to lay flat with weight loss and reduce LE edema. However BUN/Scr worsen to 70/2.41 from 51/1.75 3 days ago. This concerning for over diuresis. Initially he got BNP then pro-BPN. Pending BNP here to compare. CXR without edema. Will hold diuresis as well as hydration. He got his AM dose of Bumax prior to arrival.  - Continue Coreg 6.25mg  BID. - No ACE/ARB due to CKD  2. Acute on CKD III-IV - Worsen BUN/Scr likely due to over diuresis.  - Personally spoke with nephrologist Dr. Carolin Sicks who recommended hold diuretics and may give slow fluid challenges. Will send outpatient nephrology evaluation through epic.   3. PAF - maintaining sinus rhythm. Continue Eliquis 2.5mg  BID.   4. CAD s/p CABG - No angina. Continue ASA, statin and BB  5. HLD - 03/26/2019: Cholesterol, Total 144; HDL 71; LDL Chol Calc (NIH) 59; Triglycerides 70  - Continue Zocor  6. Chronic thrombocytopenia - Consider outpatient evaluation - Stable platelets   Severity of Illness: The appropriate patient status for this patient is OBSERVATION. Observation status is judged to be reasonable and necessary in order to provide the required intensity of service to ensure the patient's safety. The patient's presenting symptoms, physical exam findings, and initial radiographic and laboratory data in the context of their medical condition is felt to place them at decreased risk for further clinical deterioration. Furthermore, it is anticipated that the patient will be medically stable for discharge from the hospital within 2 midnights of admission. The following factors support the patient status  of observation.   " The patient's presenting symptoms include - SOB. " The physical exam findings include nona " The initial radiographic and laboratory data are Elevated BUN/SCr    For questions or updates, please contact [place] Please consult [email or web address] for contact info under        Jarrett Soho, PA  05/22/2019 10:56 AM   Patient seen and examined.  Agree with above documentation.  Mr. Clanin is an 85 year old male with a history of CAD status post CABG x1 in 1990, ischemic cardiomyopathy status post ICD 02/2018, paroxysmal atrial fibrillation, hypertension, hyperlipidemia, CVA 07/2016, CKD stage III, chronic thrombocytopenia who presents with AKI on CKD.  I saw Mr. Holton in the outpatient clinic on 12/18.  He presented with volume overload.  Increased his Bumex from 1 mg daily to 2 mg twice daily and scheduled follow-up for 12/21.  At follow-up appointment, he reported significant improvement in symptoms.  However, repeat labs showed that his renal function had worsened and had elevated proBNP.  He was told to go to the ED.  On presentation to the ED, patient reported that he was feeling significantly improved from last week.  He states that his weight is down, his dyspnea has resolved, his lower extremity edema has improved.  On exam, he is alert and oriented, no JVD, trace lower extremity edema, regular rate and rhythm, no murmurs.  Labs notable for creatinine 2.4 (1.8 ->2.2 ->2.4).  BNP is 735, down from 1045 on 12/18.  Note that the proBNP was checked on 12/21, which was 6200.  Patient appears significantly improved in regards to volume overload from last week.  However, he has  worsening renal function.  Suspect overdiuresis.  Will hold diuretics the rest of today (he took Bumex 2 mg this morning) and monitor renal function.  If stable, can likely discharge tomorrow.  We will plan to restart Bumex at lower dose.  Will arrange outpatient follow-up with  nephrology.  Donato Heinz, MD

## 2019-05-22 NOTE — ED Notes (Signed)
Patient transported to X-ray 

## 2019-05-23 ENCOUNTER — Telehealth: Payer: Self-pay | Admitting: *Deleted

## 2019-05-23 ENCOUNTER — Encounter (HOSPITAL_COMMUNITY): Payer: Self-pay | Admitting: Cardiology

## 2019-05-23 ENCOUNTER — Other Ambulatory Visit: Payer: Self-pay | Admitting: Physician Assistant

## 2019-05-23 DIAGNOSIS — N189 Chronic kidney disease, unspecified: Secondary | ICD-10-CM

## 2019-05-23 DIAGNOSIS — D696 Thrombocytopenia, unspecified: Secondary | ICD-10-CM

## 2019-05-23 DIAGNOSIS — K224 Dyskinesia of esophagus: Secondary | ICD-10-CM

## 2019-05-23 DIAGNOSIS — N179 Acute kidney failure, unspecified: Secondary | ICD-10-CM

## 2019-05-23 DIAGNOSIS — I5042 Chronic combined systolic (congestive) and diastolic (congestive) heart failure: Secondary | ICD-10-CM | POA: Diagnosis not present

## 2019-05-23 DIAGNOSIS — K220 Achalasia of cardia: Secondary | ICD-10-CM

## 2019-05-23 DIAGNOSIS — N184 Chronic kidney disease, stage 4 (severe): Secondary | ICD-10-CM

## 2019-05-23 LAB — CBC
HCT: 33.4 % — ABNORMAL LOW (ref 39.0–52.0)
Hemoglobin: 11.1 g/dL — ABNORMAL LOW (ref 13.0–17.0)
MCH: 31.6 pg (ref 26.0–34.0)
MCHC: 33.2 g/dL (ref 30.0–36.0)
MCV: 95.2 fL (ref 80.0–100.0)
Platelets: 75 10*3/uL — ABNORMAL LOW (ref 150–400)
RBC: 3.51 MIL/uL — ABNORMAL LOW (ref 4.22–5.81)
RDW: 13.9 % (ref 11.5–15.5)
WBC: 6.3 10*3/uL (ref 4.0–10.5)
nRBC: 0 % (ref 0.0–0.2)

## 2019-05-23 LAB — BASIC METABOLIC PANEL
Anion gap: 13 (ref 5–15)
BUN: 74 mg/dL — ABNORMAL HIGH (ref 8–23)
CO2: 23 mmol/L (ref 22–32)
Calcium: 8.6 mg/dL — ABNORMAL LOW (ref 8.9–10.3)
Chloride: 102 mmol/L (ref 98–111)
Creatinine, Ser: 2.34 mg/dL — ABNORMAL HIGH (ref 0.61–1.24)
GFR calc Af Amer: 28 mL/min — ABNORMAL LOW (ref >60)
GFR calc non Af Amer: 24 mL/min — ABNORMAL LOW (ref >60)
Glucose, Bld: 146 mg/dL — ABNORMAL HIGH (ref 70–99)
Potassium: 3.5 mmol/L (ref 3.5–5.1)
Sodium: 138 mmol/L (ref 135–145)

## 2019-05-23 MED ORDER — BUMETANIDE 1 MG PO TABS: 1.0000 mg | ORAL_TABLET | Freq: Every day | ORAL | 1 refills | Status: DC

## 2019-05-23 NOTE — Telephone Encounter (Signed)
Orders placed for both referrals and notified schedulers

## 2019-05-23 NOTE — Care Management Obs Status (Signed)
[place] NOTIFICATION   Patient Details  Name: Ricardo Payne MRN: [id number] Date of Birth: 1930/08/22   Medicare Observation Status Notification Given:  Yes    Zenon Mayo, RN 05/23/2019, 1:00 PM

## 2019-05-23 NOTE — Discharge Instructions (Signed)

## 2019-05-23 NOTE — Plan of Care (Signed)

## 2019-05-23 NOTE — Discharge Summary (Addendum)
Discharge Summary    Patient ID: Ricardo Payne,  MRN: [id number], DOB/AGE: [birth year] 85 y.o.  Admit date: 05/22/2019 Discharge date: 05/23/2019  Primary Care Provider: [name] Primary Cardiologist: Donato Heinz, MD Primary Electrophysiologist:  Cristopher Peru, MD  Discharge Diagnoses    Principal Problem:   Acute kidney injury superimposed on chronic kidney disease [facility]) Active Problems:   Chronic combined systolic and diastolic CHF (congestive heart failure) ([place])   Esophageal hypertension   Thrombocytopenia ([place])   Diagnostic Studies/Procedures    N/A _____________     History of Present Illness     Ricardo Payne is a 85 y.o. male with history of CAD status post CABGx1 in 1990, ischemic cardiomyopathy status post StJ ICD10/2019, chronic combined CHF, paroxysmal atrial fibrillation, hypertension, hyperlipidemia, CVA2/2018, CKD stage III-IV and chronic thrombocytopenia who presented to the hospital with abnormal labwork.   He had moved from [place] to [place] in September 2020. He previously had an LVEF of 25-30% by echo 07/2018 in [place].TTE here in 04/2019 showed improvement in EF to 40 to 45% with grade III DD. He has not been on ACE/ARB/Entresto due to CKD (baseline creatinine around 2.0). He also has history of hyperkalemia on spironolactone. He was recently seen by Dr. Gardiner Rhyme 05/18/19 with CHF exacerbation but he declined to be admitted. Bumex was increased to 2mg  BID. He was seen by Jory Sims on 05/21/19 and reported improved breathing with 4lb weight loss. However, pBNP came back higher at 6191 (proBNP) from 1045 (although this was BNP). He also had worsened BUN/Scr to 65/2.24 from 51/1.75 compared to labs 3 days prior. Based on this he was advised to come to the ED for further evaluation. His creatinine was 2.41 and he was admitted for further management.  Hospital Course    His AKI on CKD stage III-IV was felt likely due to  overdiuresis. Although pBNP was elevated, it is hard to objectively to compare to BNP level. Furthermore, renal insufficiency can also make interpretation challenging. His f/u BNP level here was 735 which was down from 1045. On arrival he appeared euvolemic with no orthopnea and reduced lower extremity edema. The case was discussed with nephrology who recommended to hold diuretics and may give slow fluid challenges. His follow-up creatinine returned at 2.34 today and felt to be stable. Dr. Gardiner Rhyme recommended he hold Bumex today and resume at 1mg  daily tomorrow. He also discussed weight parameters with the patient who knows to call for any concerns. The patient is also noted to have chronic thrombocytopenia with platelet count of 75k. Dr. Gardiner Rhyme recommended to stop his baby aspirin given his age, thrombocytopenia and concomitant Eliquis use. He also recommends establishing with local nephrology and hematology evaluation. I sent a message to our office nursing staff to help assist from outpatient end. Mr. Wildasin had been followed by nephrology back in [facility]. Dr. Gardiner Rhyme has seen and examined the patient today and feels he is stable for discharge. He would like the patient to keep his previously scheduled 06/12/18 appointment, but we have asked the patient to return to the office 05/28/19 for BMET.  Of note, niacin was removed from the patient's medicine list since he has not been taking it recently. He indicates his kidney doctor had remotely put him on this. Reviewed with MD. There is a contraindication given concomitant simvastatin, so he was asked to remain off niacin and discuss with his PCP/kidney doctor. _____________  Discharge Vitals Vital Signs. BP 116/70 (BP Location: Left  Arm)   Pulse 71   Temp 97.7 F (36.5 C) (Oral)   Resp 20   Ht 5\' 8"  (1.727 m)   Wt 92.7 kg   SpO2 96%   BMI 31.06 kg/m  General: Well developed, well nourished M in no acute distress. Head: Normocephalic, atraumatic,  sclera non-icteric, no xanthomas, nares are without discharge. Neck: JVP not elevated. Lungs: Clear bilaterally to auscultation without wheezes, rales, or rhonchi. Breathing is unlabored. Heart: RRR S1 S2 without murmurs, rubs, or gallops.  Abdomen: Soft, non-tender, non-distended with normoactive bowel sounds. No rebound/guarding. Extremities: No clubbing or cyanosis. No edema. Distal pedal pulses are 2+ and equal bilaterally. Neuro: Alert and oriented X 3. Moves all extremities spontaneously. Psych:  Responds to questions appropriately with a normal affect.  Labs & Radiologic Studies    CBC Recent Labs    05/22/19 0951 05/23/19 0412  WBC 6.4 6.3  HGB 12.2* 11.1*  HCT 36.9* 33.4*  MCV 95.1 95.2  PLT 91* 75*   Basic Metabolic Panel Recent Labs    05/22/19 0951 05/23/19 0412  NA 140 138  K 3.8 3.5  CL 103 102  CO2 24 23  GLUCOSE 111* 146*  BUN 70* 74*  CREATININE 2.41* 2.34*  CALCIUM 9.0 8.6*   _____________  DG Chest 2 View  Result Date: 05/22/2019 CLINICAL DATA:  Shortness of breath and weight gain EXAM: CHEST - 2 VIEW COMPARISON:  None. FINDINGS: There is no appreciable edema or consolidation. There is mild atelectasis in the medial right base. Heart is mildly enlarged. There is a pacemaker with lead tip attached to the right ventricle. Pulmonary vascularity is normal. There is aortic atherosclerosis. No adenopathy. No bone lesions. There are surgical clips in the upper abdomen. IMPRESSION: No edema or consolidation.  Medial right base atelectasis noted. Cardiomegaly with pacemaker lead attached to right ventricle. Pulmonary vascularity within normal limits. Aortic Atherosclerosis (ICD10-I70.0). Electronically Signed   By: Lowella Grip III M.D.   On: 05/22/2019 10:12   Disposition   Pt is being discharged home today in good condition.  Follow-up Plans & Appointments    Follow-up Information    [facility] Follow up.   Specialty: Cardiology Why:  Please come to the [place] office on 05/28/19 for labwork to recheck kidney function. You can come anytime between 8am-4pm. Contact information: [address] [place]       Donato Heinz, MD Follow up.   Specialty: Cardiology Why: Keep follow-up appointment below on 06/13/19. Contact information: [address] [place] [address] [phone]          Discharge Instructions    Ambulatory referral to Nephrology   Complete by: As directed    Nephrology evaluation as outpatient with Dr. Carolin Sicks   Diet - low sodium heart healthy   Complete by: As directed    Discharge instructions   Complete by: As directed    We have sent a request to our office to help set up a kidney doctor referral for your chronic kidney disease and a blood doctor referral for your low platelet count.  Dr. Gardiner Rhyme wants you to stop your aspirin since your platelet count has been running low. You will continue Eliquis (apixaban). If you notice any bleeding such as blood in stool, black tarry stools, blood in urine, nosebleeds or any other unusual bleeding, call your doctor immediately. It is not normal to have this kind of bleeding  while on a blood thinner and usually indicates there is an underlying problem with one of your body systems that needs to be checked out.   He would like you to avoid Bumex today (bumetanide), but restart this at 1mg  daily tomorrow. Please come to the office on 05/28/19 for recheck of kidney function.  Niacin (vitamin B3) was removed from your medicine list since you aren't taking it anymore. This can have a drug interaction with some of your other medications, so we would recommend you discuss plan for this medicine going forward with your primary care doctor/kidney doctor.   Increase activity slowly   Complete by: As directed       Discharge Medications   Allergies as of 05/23/2019    No Known Allergies     Medication List    STOP taking these medications   aspirin EC 81 MG tablet   VITAMIN B-3 PO     TAKE these medications   allopurinol 100 MG tablet Commonly known as: ZYLOPRIM Take 100 mg by mouth daily.   bumetanide 1 MG tablet Commonly known as: BUMEX Take 1 tablet (1 mg total) by mouth daily. Start taking on: May 24, 2019 What changed:   how much to take  when to take this   carvedilol 6.25 MG tablet Commonly known as: COREG Take 3.125 mg by mouth 2 (two) times daily with a meal. 1/ 2  tablet (3.125mg ) twice daily   Durezol 0.05 % Emul Generic drug: Difluprednate Place 1 drop into the right eye 2 (two) times daily. Until Wed, then 1 drop daily   Eliquis 2.5 MG Tabs tablet Generic drug: apixaban Take 2.5 mg by mouth 2 (two) times daily.   ergocalciferol 1.25 MG (50000 UT) capsule Commonly known as: VITAMIN D2 Take 50,000 Units by mouth once a week. Sunday   Folic Acid-Vit [id number] 2.5-25-1 MG Tabs tablet Commonly known as: FOLBEE Take 1 tablet by mouth daily.   pramipexole 0.25 MG tablet Commonly known as: MIRAPEX Take 0.25 mg by mouth 2 (two) times daily.   simvastatin 40 MG tablet Commonly known as: ZOCOR Take 40 mg by mouth daily at 6 PM.        Allergies:  No Known Allergies   Outstanding Labs/Studies   BMET 05/28/19  Duration of Discharge Encounter   Greater than 30 minutes including physician time.  Signed, Charlie Pitter PA-C 05/23/2019, 11:57 AM

## 2019-05-23 NOTE — Telephone Encounter (Signed)
-----   Message from Charlie Pitter, [place] sent at 05/23/2019 11:18 AM EST ----- Regarding: Referral help? 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 I wasn't sure who his covering nurse was. He is discharging this gentleman today from the hospital. Dr. Gardiner Rhyme wants him to have an outpatient nephrology referral (CKD stage IV) and hematology referral (thrombocytopenia). Can you help? Thanks!  Dayna Dunn PA-C

## 2019-05-28 ENCOUNTER — Telehealth: Payer: Self-pay | Admitting: Cardiology

## 2019-05-28 ENCOUNTER — Other Ambulatory Visit: Payer: Self-pay

## 2019-05-28 DIAGNOSIS — N179 Acute kidney failure, unspecified: Secondary | ICD-10-CM

## 2019-05-28 NOTE — Telephone Encounter (Signed)
Patient was a walk in today and states his cataract surgery is rescheduled to 06/05/19.  Dr. Gardiner Rhyme cancelled the surgery last week due to "something was wrong with my liver".  Patient is asking if he is ok to have his surgery on 06/05/19.  Please call him at [phone]

## 2019-05-28 NOTE — Telephone Encounter (Signed)
I am not sure what he is referring to, I did not know he was having cataract surgery and did not cancel it.  He is OK to proceed with cataract surgery.

## 2019-05-29 LAB — BASIC METABOLIC PANEL
BUN/Creatinine Ratio: 25 — ABNORMAL HIGH (ref 10–24)
BUN: 47 mg/dL — ABNORMAL HIGH (ref 8–27)
CO2: 25 mmol/L (ref 20–29)
Calcium: 9.1 mg/dL (ref 8.6–10.2)
Chloride: 103 mmol/L (ref 96–106)
Creatinine, Ser: 1.87 mg/dL — ABNORMAL HIGH (ref 0.76–1.27)
GFR calc Af Amer: 37 mL/min/{1.73_m2} — ABNORMAL LOW (ref >59)
GFR calc non Af Amer: 32 mL/min/{1.73_m2} — ABNORMAL LOW (ref >59)
Glucose: 123 mg/dL — ABNORMAL HIGH (ref 65–99)
Potassium: 4.5 mmol/L (ref 3.5–5.2)
Sodium: 144 mmol/L (ref 134–144)

## 2019-05-29 NOTE — Telephone Encounter (Signed)
Pt updated and verbalized understanding.  

## 2019-05-29 NOTE — Telephone Encounter (Signed)
Left message to call back  

## 2019-05-31 ENCOUNTER — Telehealth: Payer: Self-pay | Admitting: Physician Assistant

## 2019-05-31 ENCOUNTER — Encounter: Payer: Self-pay | Admitting: Physician Assistant

## 2019-05-31 NOTE — Telephone Encounter (Signed)
Duplicate encounter created in error.

## 2019-05-31 NOTE — Telephone Encounter (Signed)
A new hem appt has been scheduled for Mr. Curet to see Cassie on 1/11 at 1:30pm. Pt aware to arrive 15 minutes early. Letter mailed.

## 2019-06-11 ENCOUNTER — Inpatient Hospital Stay: Payer: Medicare Other

## 2019-06-11 ENCOUNTER — Other Ambulatory Visit: Payer: Self-pay | Admitting: Physician Assistant

## 2019-06-11 ENCOUNTER — Encounter: Payer: Self-pay | Admitting: Physician Assistant

## 2019-06-11 ENCOUNTER — Inpatient Hospital Stay: Payer: Medicare Other | Attending: Physician Assistant | Admitting: Physician Assistant

## 2019-06-11 ENCOUNTER — Other Ambulatory Visit: Payer: Self-pay

## 2019-06-11 VITALS — BP 133/77 | HR 75 | Temp 98.2°F | Resp 18 | Ht 68.0 in | Wt 218.1 lb

## 2019-06-11 DIAGNOSIS — D696 Thrombocytopenia, unspecified: Secondary | ICD-10-CM

## 2019-06-11 DIAGNOSIS — N184 Chronic kidney disease, stage 4 (severe): Secondary | ICD-10-CM | POA: Diagnosis not present

## 2019-06-11 DIAGNOSIS — D631 Anemia in chronic kidney disease: Secondary | ICD-10-CM | POA: Insufficient documentation

## 2019-06-11 DIAGNOSIS — I2510 Atherosclerotic heart disease of native coronary artery without angina pectoris: Secondary | ICD-10-CM | POA: Diagnosis not present

## 2019-06-11 DIAGNOSIS — R319 Hematuria, unspecified: Secondary | ICD-10-CM

## 2019-06-11 DIAGNOSIS — R600 Localized edema: Secondary | ICD-10-CM | POA: Insufficient documentation

## 2019-06-11 DIAGNOSIS — Z114 Encounter for screening for human immunodeficiency virus [HIV]: Secondary | ICD-10-CM | POA: Diagnosis not present

## 2019-06-11 DIAGNOSIS — Z7901 Long term (current) use of anticoagulants: Secondary | ICD-10-CM | POA: Diagnosis not present

## 2019-06-11 DIAGNOSIS — N400 Enlarged prostate without lower urinary tract symptoms: Secondary | ICD-10-CM | POA: Insufficient documentation

## 2019-06-11 DIAGNOSIS — Z8249 Family history of ischemic heart disease and other diseases of the circulatory system: Secondary | ICD-10-CM | POA: Diagnosis not present

## 2019-06-11 DIAGNOSIS — Z818 Family history of other mental and behavioral disorders: Secondary | ICD-10-CM | POA: Insufficient documentation

## 2019-06-11 DIAGNOSIS — R0602 Shortness of breath: Secondary | ICD-10-CM | POA: Insufficient documentation

## 2019-06-11 DIAGNOSIS — E782 Mixed hyperlipidemia: Secondary | ICD-10-CM | POA: Insufficient documentation

## 2019-06-11 DIAGNOSIS — R5382 Chronic fatigue, unspecified: Secondary | ICD-10-CM | POA: Diagnosis not present

## 2019-06-11 DIAGNOSIS — M109 Gout, unspecified: Secondary | ICD-10-CM | POA: Diagnosis not present

## 2019-06-11 DIAGNOSIS — D693 Immune thrombocytopenic purpura: Secondary | ICD-10-CM | POA: Diagnosis present

## 2019-06-11 DIAGNOSIS — Z8673 Personal history of transient ischemic attack (TIA), and cerebral infarction without residual deficits: Secondary | ICD-10-CM | POA: Diagnosis not present

## 2019-06-11 DIAGNOSIS — R361 Hematospermia: Secondary | ICD-10-CM

## 2019-06-11 DIAGNOSIS — I130 Hypertensive heart and chronic kidney disease with heart failure and stage 1 through stage 4 chronic kidney disease, or unspecified chronic kidney disease: Secondary | ICD-10-CM | POA: Diagnosis present

## 2019-06-11 DIAGNOSIS — Z5320 Procedure and treatment not carried out because of patient's decision for unspecified reasons: Secondary | ICD-10-CM

## 2019-06-11 DIAGNOSIS — Z87891 Personal history of nicotine dependence: Secondary | ICD-10-CM | POA: Insufficient documentation

## 2019-06-11 DIAGNOSIS — I420 Dilated cardiomyopathy: Secondary | ICD-10-CM | POA: Insufficient documentation

## 2019-06-11 DIAGNOSIS — D61818 Other pancytopenia: Secondary | ICD-10-CM | POA: Diagnosis not present

## 2019-06-11 DIAGNOSIS — D649 Anemia, unspecified: Secondary | ICD-10-CM

## 2019-06-11 DIAGNOSIS — Z79899 Other long term (current) drug therapy: Secondary | ICD-10-CM | POA: Insufficient documentation

## 2019-06-11 DIAGNOSIS — Z95810 Presence of automatic (implantable) cardiac defibrillator: Secondary | ICD-10-CM

## 2019-06-11 LAB — CBC WITH DIFFERENTIAL (CANCER CENTER ONLY)
Abs Immature Granulocytes: 0.01 10*3/uL (ref 0.00–0.07)
Basophils Absolute: 0 10*3/uL (ref 0.0–0.1)
Basophils Relative: 1 %
Eosinophils Absolute: 0.2 10*3/uL (ref 0.0–0.5)
Eosinophils Relative: 2 %
HCT: 34.8 % — ABNORMAL LOW (ref 39.0–52.0)
Hemoglobin: 11.3 g/dL — ABNORMAL LOW (ref 13.0–17.0)
Immature Granulocytes: 0 %
Lymphocytes Relative: 14 %
Lymphs Abs: 0.9 10*3/uL (ref 0.7–4.0)
MCH: 31.4 pg (ref 26.0–34.0)
MCHC: 32.5 g/dL (ref 30.0–36.0)
MCV: 96.7 fL (ref 80.0–100.0)
Monocytes Absolute: 0.6 10*3/uL (ref 0.1–1.0)
Monocytes Relative: 9 %
Neutro Abs: 4.6 10*3/uL (ref 1.7–7.7)
Neutrophils Relative %: 74 %
Platelet Count: 71 10*3/uL — ABNORMAL LOW (ref 150–400)
RBC: 3.6 MIL/uL — ABNORMAL LOW (ref 4.22–5.81)
RDW: 14.8 % (ref 11.5–15.5)
WBC Count: 6.2 10*3/uL (ref 4.0–10.5)
nRBC: 0 % (ref 0.0–0.2)

## 2019-06-11 LAB — CMP (CANCER CENTER ONLY)
ALT: 25 U/L (ref 0–44)
AST: 26 U/L (ref 15–41)
Albumin: 4.2 g/dL (ref 3.5–5.0)
Alkaline Phosphatase: 140 U/L — ABNORMAL HIGH (ref 38–126)
Anion gap: 10 (ref 5–15)
BUN: 55 mg/dL — ABNORMAL HIGH (ref 8–23)
CO2: 26 mmol/L (ref 22–32)
Calcium: 8.7 mg/dL — ABNORMAL LOW (ref 8.9–10.3)
Chloride: 106 mmol/L (ref 98–111)
Creatinine: 1.84 mg/dL — ABNORMAL HIGH (ref 0.61–1.24)
GFR, Est AFR Am: 38 mL/min — ABNORMAL LOW (ref >60)
GFR, Estimated: 32 mL/min — ABNORMAL LOW (ref >60)
Glucose, Bld: 110 mg/dL — ABNORMAL HIGH (ref 70–99)
Potassium: 4.4 mmol/L (ref 3.5–5.1)
Sodium: 142 mmol/L (ref 135–145)
Total Bilirubin: 1.5 mg/dL — ABNORMAL HIGH (ref 0.3–1.2)
Total Protein: 7.1 g/dL (ref 6.5–8.1)

## 2019-06-11 LAB — LACTATE DEHYDROGENASE: LDH: 202 U/L — ABNORMAL HIGH (ref 98–192)

## 2019-06-11 LAB — HEPATITIS PANEL, ACUTE
HCV Ab: NONREACTIVE
Hep A IgM: NONREACTIVE
Hep B C IgM: NONREACTIVE
Hepatitis B Surface Ag: NONREACTIVE

## 2019-06-11 LAB — FOLATE: Folate: >100 ng/mL (ref >5.9)

## 2019-06-11 LAB — VITAMIN B12: Vitamin B-12: 2253 pg/mL — ABNORMAL HIGH (ref 180–914)

## 2019-06-11 NOTE — Progress Notes (Signed)
[place] [phone]   [phone]  CONSULT NOTE  REFERRING PHYSICIAN: Dr. Gardiner Rhyme   REASON FOR CONSULTATION:  Thrombocytopenia  HPI Ricardo Payne is a 86 y.o. male with a past medical history significant for dilated cardiomyopathy, CKD, hypertension, hyperlipidemia, cerebellar stroke, gout, cataracts, atrial fibrillation, BPH, and coronary artery disease was referred to the clinic for evaluation of thrombocytopenia.  The patient recently had a short hospitalization for his congestive heart failure on 05/22/2019-05/23/2019.  Routine labs performed during this hospital admission noted mild anemia with a hemoglobin of 11.7, normal white blood cell count, and thrombocytopenia with a platelet count of 84K.The patient recently moved to [place], [place] in September 2020 from [place], so records are limited. Upon reviewing records from his cardiologist Dr. Nyoka Cowden in [place] on 10/03/2018, his platelets at that time appeared to be low at [facility]. He continued to have stable but persistent anemia with a hemoglobin of 10.7.  The patient was unaware as to the chronicity of his thrombocytopenia and denies ever being told of this lab abnormality before. The patient was referred to the clinic for evaluation and recommendations regarding his thrombocytopenia.  The patient is feeling fairly well today without any concerning complaints except for concerns related to his CKD and congestive heart failure.  The patient is reporting orthopnea as well as bilateral lower extremity swelling with associated achiness in his legs.  He states that he was previously prescribed Vicodin for his leg pain but he is no longer being prescribed this.  He is followed closely by Dr. Gardiner Rhyme who is his new local cardiologist.   Regarding his thrombocytopenia, the patient denies any recent epistaxis, he states that he had a single nosebleed approximately 1 year ago out of his right  nostril but nothing since that time.  He denies any gingival bleeding, melena, hematochezia, hematemesis, or hematuria.  However, the patient is very adamant that he has had 2 episodes of having "blood in his semen".  The first episode occurred sometime around September/October.  The second episode happened approximately 1 month ago.  Of note, it appears that the patient had a urinalysis performed in May 2020 which was negative for blood.  The patient is on Eliquis currently.  His 81 mg dose of aspirin was discontinued during his recent hospitalization in December 2020.   He denies any fever, chills, night sweats, weight loss, or lymphadenopathy.  He denies any history of any viral infections such as hepatitis or HIV.  Patient denies any NSAID use or herbal supplements.  He denies any history of vitamin deficiencies.  Denies any nausea, vomiting, diarrhea, or constipation.  Denies any recent antibiotic use.  He denies any new medications and states that he did not receive any new medications during his recent hospitalization.  He denies any certain dietary habits such as being a vegan or vegetarian.   The patient denies any family history of any hematologic or bone marrow disorders.  The patient's mother was reportedly healthy and died by committing suicide.  The patient's father had congestive heart failure as well as valvular abnormalities.  The patient has a half brother who also has heart disease.  The patient has a son who, based on the patient's description, had a CABG performed.  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  He is widowed.  He states that he drinks approximately 10 beers a year.  He denies any history of drug use.  The patient is a former smoker smoking  approximately 30 years 2 packs/day. HPI  Past Medical History:  Diagnosis Date  . Acquired thrombophilia ([place])   . Age-related cataract of both eyes   . ASHD (arteriosclerotic heart disease)   . Benign prostatic hyperplasia   . CAD  (coronary artery disease)    a. s/p CABG x1 in 1990  . Cerebellar stroke ([place]) 07/02/2016  . Chronic combined systolic and diastolic CHF (congestive heart failure) ([place])   . Chronic gout without tophus   . CKD (chronic kidney disease), stage IV ([place])   . Essential hypertension   . ICD (implantable cardioverter-defibrillator) in place   . Ischemic cardiomyopathy   . Mixed hyperlipidemia   . PAF (paroxysmal atrial fibrillation) ([place])   . Restless legs   . Secondary pulmonary arterial hypertension ([place])   . Thrombocytopenia ([place])   . Vitamin D deficiency     Past Surgical History:  Procedure Laterality Date  . CHOLECYSTECTOMY    . CORONARY ARTERY BYPASS GRAFT    . PACEMAKER IMPLANT  10/2018    Family History  Problem Relation Age of Onset  . Hypertension Mother   . Suicidality Mother   . Valvular heart disease Father   . Heart disease Father   . Heart attack Brother     Social History Social History   Tobacco Use  . Smoking status: Former Smoker    Quit date: 1988    Years since quitting: 33.0  . Smokeless tobacco: Never Used  Substance Use Topics  . Alcohol use: Yes    Comment: rare  . Drug use: Never    No Known Allergies  Current Outpatient Medications  Medication Sig Dispense Refill  . allopurinol (ZYLOPRIM) 100 MG tablet Take 100 mg by mouth daily.    [place] apixaban (ELIQUIS) 2.5 MG TABS tablet Take 2.5 mg by mouth 2 (two) times daily.    . bumetanide (BUMEX) 1 MG tablet Take 1 tablet (1 mg total) by mouth daily. 90 tablet 1  . carvedilol (COREG) 6.25 MG tablet Take 3.125 mg by mouth 2 (two) times daily with a meal. 1/ 2  tablet (3.125mg ) twice daily    . DUREZOL 0.05 % EMUL Place 1 drop into the right eye 2 (two) times daily. Until Wed, then 1 drop daily    . ergocalciferol (VITAMIN D2) 1.25 MG (50000 UT) capsule Take 50,000 Units by mouth once a week. Sunday    . Folic Acid-Vit Q000111Q 123456 (FOLBEE) 2.5-25-1 MG TABS tablet Take 1 tablet by mouth daily.    .  pramipexole (MIRAPEX) 0.25 MG tablet Take 0.25 mg by mouth 2 (two) times daily.    . simvastatin (ZOCOR) 40 MG tablet Take 40 mg by mouth daily at 6 PM.     No current facility-administered medications for this visit.    Review of Systems REVIEW OF SYSTEMS:   Review of Systems  Constitutional: Negative for appetite change, chills, fatigue, fever and unexpected weight change.  HENT: Negative for mouth sores, nosebleeds, sore throat and trouble swallowing.   Eyes: Negative for eye problems and icterus.  Respiratory: Positive for shortness of breath with exertion.Negative for cough, hemoptysis, and wheezing.  Cardiovascular: Positive for bilateral lower extremity edema and orthopnea. Negative for chest pain. Gastrointestinal: Negative for abdominal pain, constipation, diarrhea, nausea and vomiting.  Genitourinary: Negative for bladder incontinence, difficulty urinating, dysuria, frequency and hematuria.   Musculoskeletal: Negative for back pain, gait problem, neck pain and neck stiffness.  Skin: Negative for itching and rash.  Neurological: Negative  for dizziness, extremity weakness, gait problem, headaches, light-headedness and seizures.  Hematological: Positive for bruising on anterior chest and right arm. Negative for adenopathy. Psychiatric/Behavioral: Negative for confusion, depression and sleep disturbance. The patient is not nervous/anxious.     PHYSICAL EXAMINATION:  Blood pressure 133/77, pulse 75, temperature 98.2 F (36.8 C), temperature source Temporal, resp. rate 18, height 5\' 8"  (1.727 m), weight 218 lb 1.6 oz (98.9 kg), SpO2 100 %.  ECOG PERFORMANCE STATUS: 1  Physical Exam  Constitutional: Oriented to person, place, and time and well-developed, well-nourished, and in no distress.  HENT:  Head: Normocephalic and atraumatic.  Mouth/Throat: Oropharynx is clear and moist. No oropharyngeal exudate.  Eyes: Conjunctivae are normal. Right eye exhibits no discharge. Left eye  exhibits no discharge. No scleral icterus.  Neck: Normal range of motion. Neck supple.  Cardiovascular: Normal rate, irregular rhythm, normal heart sounds and intact distal pulses.   Pulmonary/Chest: Effort normal and breath sounds normal. No respiratory distress. No wheezes. No rales.  Abdominal: Soft. Bowel sounds are normal. Exhibits no distension and no mass. There is no tenderness.  Musculoskeletal: Bilateral lower extremity edema. Normal range of motion. Lymphadenopathy:    No cervical adenopathy.  Neurological: Alert and oriented to person, place, and time. Exhibits normal muscle tone. Gait normal. Coordination normal.  Skin: Ecchymosis noted with large bruise on anterior chest wall and right upper extremities. Skin is warm and dry. No rash noted. Not diaphoretic. No erythema. No pallor.  Psychiatric: Mood, memory and judgment normal.  Vitals reviewed.  LABORATORY DATA: Lab Results  Component Value Date   WBC 6.2 06/11/2019   HGB 11.3 (L) 06/11/2019   HCT 34.8 (L) 06/11/2019   MCV 96.7 06/11/2019   PLT 71 (L) 06/11/2019      Chemistry      Component Value Date/Time   NA 142 06/11/2019 1323   NA 144 05/28/2019 1405   K 4.4 06/11/2019 1323   CL 106 06/11/2019 1323   CO2 26 06/11/2019 1323   BUN 55 (H) 06/11/2019 1323   BUN 47 (H) 05/28/2019 1405   CREATININE 1.84 (H) 06/11/2019 1323      Component Value Date/Time   CALCIUM 8.7 (L) 06/11/2019 1323   ALKPHOS 140 (H) 06/11/2019 1323   AST 26 06/11/2019 1323   ALT 25 06/11/2019 1323   BILITOT 1.5 (H) 06/11/2019 1323       RADIOGRAPHIC STUDIES: DG Chest 2 View  Result Date: 05/22/2019 CLINICAL DATA:  Shortness of breath and weight gain EXAM: CHEST - 2 VIEW COMPARISON:  None. FINDINGS: There is no appreciable edema or consolidation. There is mild atelectasis in the medial right base. Heart is mildly enlarged. There is a pacemaker with lead tip attached to the right ventricle. Pulmonary vascularity is normal. There is  aortic atherosclerosis. No adenopathy. No bone lesions. There are surgical clips in the upper abdomen. IMPRESSION: No edema or consolidation.  Medial right base atelectasis noted. Cardiomegaly with pacemaker lead attached to right ventricle. Pulmonary vascularity within normal limits. Aortic Atherosclerosis (ICD10-I70.0). Electronically Signed   By: Lowella Grip III M.D.   On: 05/22/2019 10:12    ASSESSMENT: This is a very pleasant 85 year old [place] male referred to the clinic for evaluation of thrombocytopenia as well as mild anemia.   PLAN: The patient was seen with Dr. Julien Nordmann today.  Her lab studies performed to evaluate his condition occluding a CBC, CMP, LDH, hepatitis panel, folate acid, vitamin B12, iron studies, ferritin, and von Willebrand panel. HIV testing  could not be performed due to insurance reasons.   The patient's CBC from today showed stable but persistent thrombocytopenia with a platelet count of 71K.  The patient continues to have stable, mild anemia with a hemoglobin of 11.3, of note, the patient has CKD. He is still waiting to establish care with a local nephrologist. Given that the chronicity of his thrombocytopenia, records show it has been stable but persistently low since at least Spring 2020, it is likely secondary ITP but we cannot rule out etiologies at this point. His further lab testing is still pending at this time.   Dr. Julien Nordmann recommends that the patient continue on observation for now. Dr. Julien Nordmann would not recommend treatment for his thrombocytopenia unless his platelets <30k or he develops significant bleeding.   We will see the patient back for a follow up visit in 2 weeks for evaluation and to review the results of his lab work.   The patient voices understanding of current disease status and treatment options and is in agreement with the current care plan.  All questions were answered. The patient knows to call the clinic with any problems, questions  or concerns. We can certainly see the patient much sooner if necessary.  Thank you so much for allowing me to participate in the care of Ricardo Payne. I will continue to follow up the patient with you and assist in his care.  I spent 45 minutes counseling the patient face to face. The total time spent in the appointment was 70 minutes.  Disclaimer: This note was dictated with voice recognition software. Similar sounding words can inadvertently be transcribed and may not be corrected upon review.   Yordy Matton L Walton Digilio June 11, 2019, 4:47 PM  ADDENDUM: Hematology/Oncology Attending: I had a face-to-face encounter with the patient today.  I recommended his care plan.  This is a very pleasant 86 years old white male with persistent thrombocytopenia.  He moved recently from [place] to [place] and the last available blood work for [place] from [place] showed platelets count of 86,000 in May 2020. The patient was seen recently by primary care physician and repeat blood work showed platelets count 75,000 on May 23, 2019.  The patient was referred to [place] today for evaluation and recommendation regarding his condition.  His CBC also showed persistent anemia with hemoglobin of 11.1.  The patient had normal white blood count. I had a lengthy discussion with the patient today about his condition and further investigation to confirm his diagnosis.  He likely has idiopathic thrombocytopenic purpura and normally would not consider the patient for any treatment with steroids or other treatment options like IVIG unless his platelet count is less than 30,000 or the patient has bleeding issues. We will order several studies today to identify any other underlying cause for his thrombocytopenia including repeat CBC, comprehensive metabolic panel, LDH, iron study, ferritin, vitamin B12 level, serum folate as well as hepatitis panel and HIV and von Willebrand panel. The patient will come back for  follow-up visit in 2 weeks for evaluation and discussion of his lab results and further recommendation regarding his condition. He was advised to call immediately if he has any other concerning symptoms in the interval.  Disclaimer: This note was dictated with voice recognition software. Similar sounding words can inadvertently be transcribed and may be missed upon review. Eilleen Kempf, MD 06/11/19

## 2019-06-12 ENCOUNTER — Telehealth: Payer: Self-pay | Admitting: Internal Medicine

## 2019-06-12 LAB — FERRITIN: Ferritin: 58 ng/mL (ref 24–336)

## 2019-06-12 LAB — IRON AND TIBC
Iron: 41 ug/dL — ABNORMAL LOW (ref 42–163)
Saturation Ratios: 12 % — ABNORMAL LOW (ref 20–55)
TIBC: 334 ug/dL (ref 202–409)
UIBC: 292 ug/dL (ref 117–376)

## 2019-06-12 LAB — COAG STUDIES INTERP REPORT

## 2019-06-12 LAB — VON WILLEBRAND PANEL
Coagulation Factor VIII: 234 % — ABNORMAL HIGH (ref 56–140)
Ristocetin Co-factor, Plasma: 199 % (ref 50–200)
Von Willebrand Antigen, Plasma: 256 % — ABNORMAL HIGH (ref 50–200)

## 2019-06-12 NOTE — Telephone Encounter (Signed)
Scheduled per los. Called and spoke with patient. Confirmed appt 

## 2019-06-13 ENCOUNTER — Other Ambulatory Visit: Payer: Self-pay

## 2019-06-13 ENCOUNTER — Ambulatory Visit (INDEPENDENT_AMBULATORY_CARE_PROVIDER_SITE_OTHER): Payer: Medicare Other | Admitting: Cardiology

## 2019-06-13 ENCOUNTER — Encounter: Payer: Self-pay | Admitting: Cardiology

## 2019-06-13 VITALS — BP 120/50 | HR 75 | Temp 97.8°F | Ht 68.0 in | Wt 220.0 lb

## 2019-06-13 DIAGNOSIS — I480 Paroxysmal atrial fibrillation: Secondary | ICD-10-CM | POA: Diagnosis not present

## 2019-06-13 DIAGNOSIS — I10 Essential (primary) hypertension: Secondary | ICD-10-CM | POA: Diagnosis not present

## 2019-06-13 DIAGNOSIS — I5042 Chronic combined systolic (congestive) and diastolic (congestive) heart failure: Secondary | ICD-10-CM

## 2019-06-13 DIAGNOSIS — M79605 Pain in left leg: Secondary | ICD-10-CM

## 2019-06-13 DIAGNOSIS — M79604 Pain in right leg: Secondary | ICD-10-CM | POA: Diagnosis not present

## 2019-06-13 NOTE — Patient Instructions (Signed)
Medication Instructions:  Continue same medications *If you need a refill on your cardiac medications before your next appointment, please call your pharmacy*  Lab Work: None ordered   Testing/Procedures: Schedule ABI's   Follow-Up: At [facility], you and your health needs are our priority.  As part of our continuing mission to provide you with exceptional heart care, we have created designated Provider Care Teams.  These Care Teams include your primary Cardiologist (physician) and Advanced Practice Providers (APPs -  Physician Assistants and Nurse Practitioners) who all work together to provide you with the care you need, when you need it.  Your next appointment:  2 months   The format for your next appointment:  Office   Provider:  Dr.Schumann

## 2019-06-13 NOTE — Progress Notes (Signed)
Cardiology Office Note:    Date:  06/14/2019   ID:  Ricardo Payne, DOB August 24, 1930, MRN [id number]  PCP:  [name], FNP  Cardiologist:  Donato Heinz, MD  Electrophysiologist:  Cristopher Peru, MD   Referring MD: Jillyn Ledger, FNP  Chief Complaint  Patient presents with  . Follow-up    3 months.    History of Present Illness:    Ricardo Payne is a 86 y.o. male with a hx of CAD status post CABG x1 in 1990, ischemic cardiomyopathy status post ICD 02/2018, atrial fibrillation, hypertension, hyperlipidemia, CVA 07/2016, CKD stage III who presents for follow-up.  He was seen initially on 03/26/2019.  He had moved from [place] to [place] in September 2020.  He reported no issues, states that he had had issues with lower extremity edema earlier in the year but started on Bumex 1 mg daily in May and symptoms have been well controlled.  TTE from 07/2018 in [place] showed EF 25 to 30%.  TTE here in 04/2019 showed improvement in EF to 40 to 45%.  At initial appointment he was taking carvedilol 625 mg twice daily and Aldactone 25 mg daily.  Records from his cardiologist were obtained and indicated that he was not on an ACE/ARB/Entresto due to CKD (baseline creatinine around 2.0).  Records from cardiologist indicated that spironolactone was tried and discontinued secondary to hyperkalemia.  However he was still taking when seen in clinic.  Labs showed creatinine 2.04, potassium 5.1; he was told to discontinue his spironolactone.  He presented to the clinic on 05/18/2019 with worsening shortness of breath and lower extremity edema.  His Bumex was increased to 2 mg twice daily.  Follow-up appointment on 12/21 with Jory Sims, he reported significant improvement in his symptoms.  However his renal function was worsening, and he was recommended to go to the ER.  He was briefly admitted to the hospital, Bumex was held, and creatinine improved.  Today he reports that he has been  doing well.  He reports that he is weighing himself daily and weight stable at home.  Reports his dyspnea is improved.  Does have some dyspnea at night.  Leg swelling is also improved, though continues to have some swelling in his legs.  States that his bank like today is pain in his legs, describes it as an aching pain in his legs sometimes keeps him up at night.     Past Medical History:  Diagnosis Date  . Acquired thrombophilia ([place])   . Age-related cataract of both eyes   . ASHD (arteriosclerotic heart disease)   . Benign prostatic hyperplasia   . CAD (coronary artery disease)    a. s/p CABG x1 in 1990  . Cerebellar stroke ([place]) 07/02/2016  . Chronic combined systolic and diastolic CHF (congestive heart failure) ([place])   . Chronic gout without tophus   . CKD (chronic kidney disease), stage IV ([place])   . Essential hypertension   . ICD (implantable cardioverter-defibrillator) in place   . Ischemic cardiomyopathy   . Mixed hyperlipidemia   . PAF (paroxysmal atrial fibrillation) ([place])   . Restless legs   . Secondary pulmonary arterial hypertension ([place])   . Thrombocytopenia ([place])   . Vitamin D deficiency     Past Surgical History:  Procedure Laterality Date  . CHOLECYSTECTOMY    . CORONARY ARTERY BYPASS GRAFT    . PACEMAKER IMPLANT  10/2018    Current Medications: Current Meds  Medication Sig  .  allopurinol (ZYLOPRIM) 100 MG tablet Take 100 mg by mouth daily.  Marland Kitchen apixaban (ELIQUIS) 2.5 MG TABS tablet Take 2.5 mg by mouth 2 (two) times daily.  . bumetanide (BUMEX) 1 MG tablet Take 1 tablet (1 mg total) by mouth daily.  . carvedilol (COREG) 6.25 MG tablet Take 3.125 mg by mouth 2 (two) times daily with a meal. 1/ 2  tablet (3.125mg ) twice daily  . DUREZOL 0.05 % EMUL Place 1 drop into the right eye 2 (two) times daily. Until Wed, then 1 drop daily  . ergocalciferol (VITAMIN D2) 1.25 MG (50000 UT) capsule Take 50,000 Units by mouth once a week. Sunday  . Folic Acid-Vit Q000111Q 123456  (FOLBEE) 2.5-25-1 MG TABS tablet Take 1 tablet by mouth daily.  . pramipexole (MIRAPEX) 0.25 MG tablet Take 0.25 mg by mouth 2 (two) times daily.  . simvastatin (ZOCOR) 40 MG tablet Take 40 mg by mouth daily at 6 PM.     Allergies:   Patient has no known allergies.   Social History   Socioeconomic History  . Marital status: Widowed    Spouse name: Not on file  . Number of children: 2  . Years of education: Not on file  . Highest education level: Not on file  Occupational History  . Not on file  Tobacco Use  . Smoking status: Former Smoker    Quit date: 1988    Years since quitting: 33.0  . Smokeless tobacco: Never Used  Substance and Sexual Activity  . Alcohol use: Yes    Comment: rare  . Drug use: Never  . Sexual activity: Not on file  Other Topics Concern  . Not on file  Social History Narrative  . Not on file   Social Determinants of Health   Financial Resource Strain:   . Difficulty of Paying Living Expenses: Not on file  Food Insecurity:   . Worried About Charity fundraiser in the Last Year: Not on file  . Ran Out of Food in the Last Year: Not on file  Transportation Needs:   . Lack of Transportation (Medical): Not on file  . Lack of Transportation (Non-Medical): Not on file  Physical Activity:   . Days of Exercise per Week: Not on file  . Minutes of Exercise per Session: Not on file  Stress:   . Feeling of Stress : Not on file  Social Connections:   . Frequency of Communication with Friends and Family: Not on file  . Frequency of Social Gatherings with Friends and Family: Not on file  . Attends Religious Services: Not on file  . Active Member of Clubs or Organizations: Not on file  . Attends Archivist Meetings: Not on file  . Marital Status: Not on file     Family History: The patient's family history includes Heart attack in his brother; Heart disease in his father; Hypertension in his mother; Suicidality in his mother; Valvular heart  disease in his father.  ROS:   Please see the history of present illness.     All other systems reviewed and are negative.  EKGs/Labs/Other Studies Reviewed:    The following studies were reviewed today:   EKG:  EKG is  Not ordered today.  The ekg ordered 05/18/19  demonstrates normal sinus rhythm, rate 71, left bundle branch block, first-degree AV block  TTE 04/02/19:  1. Left ventricular ejection fraction, by visual estimation, is 40 to 45%. The left ventricle has moderately decreased function. Left ventricular  septal wall thickness was normal. Mildly increased left ventricular posterior wall thickness. There is  borderline left ventricular hypertrophy.  2. Inferior and inferoseptal akinesis and thinning.  3. Elevated left atrial and left ventricular end-diastolic pressures.  4. Left ventricular diastolic parameters are consistent with Grade III diastolic dysfunction (restrictive).  5. Global right ventricle has mildly reduced systolic function.The right ventricular size is normal. No increase in right ventricular wall thickness.  6. Left atrial size was severely dilated.  7. Right atrial size was moderately dilated.  8. The mitral valve is abnormal. Mild mitral valve regurgitation.  9. The tricuspid valve is grossly normal. Tricuspid valve regurgitation moderate. 10. Aortic valve area, by VTI measures 1.60 cm. 11. Aortic valve mean gradient measures 8.0 mmHg. 12. Aortic valve peak gradient measures 13.1 mmHg. 13. The aortic valve is tricuspid. Aortic valve regurgitation is trivial. Mild aortic valve stenosis. 14. The pulmonic valve was grossly normal. Pulmonic valve regurgitation is mild. 15. Moderately elevated pulmonary artery systolic pressure. 16. The inferior vena cava is dilated in size with >50% respiratory variability, suggesting right atrial pressure of 8 mmHg. 17. The average left ventricular global longitudinal strain is -10.7 %. 18. A ICD is visualized.  Recent  Labs: 03/26/2019: Magnesium 2.1 05/21/2019: NT-Pro BNP 6,191 05/22/2019: B Natriuretic Peptide 735.0 06/11/2019: ALT 25; BUN 55; Creatinine 1.84; Hemoglobin 11.3; Platelet Count 71; Potassium 4.4; Sodium 142  Recent Lipid Panel    Component Value Date/Time   CHOL 144 03/26/2019 1425   TRIG 70 03/26/2019 1425   HDL 71 03/26/2019 1425   CHOLHDL 2.0 03/26/2019 1425   LDLCALC 59 03/26/2019 1425    Physical Exam:    VS:  BP (!) 120/50 (BP Location: Left Arm, Patient Position: Sitting, Cuff Size: Normal)   Pulse 75   Temp 97.8 F (36.6 C)   Ht 5\' 8"  (1.727 m)   Wt 220 lb (99.8 kg)   BMI 33.45 kg/m     Wt Readings from Last 3 Encounters:  06/13/19 220 lb (99.8 kg)  06/11/19 218 lb 1.6 oz (98.9 kg)  05/23/19 204 lb 4.8 oz (92.7 kg)     GEN:  Well nourished, well developed in no acute distress HEENT: Normal NECK: No JVD LYMPHATICS: No lymphadenopathy CARDIAC: RRR, no murmurs, rubs, gallops RESPIRATORY:  Clear to auscultation without rales, wheezing or rhonchi  ABDOMEN: Soft, non-tender, non-distended MUSCULOSKELETAL:  1+ LE edema SKIN: Warm and dry NEUROLOGIC:  Alert and oriented x 3 PSYCHIATRIC:  Normal affect   ASSESSMENT:    1. Leg pain, bilateral   2. Chronic combined systolic and diastolic CHF (congestive heart failure) ([place])   3. Essential hypertension   4. Paroxysmal atrial fibrillation ([facility])    PLAN:     Chronic systolic heart failure: ischemic cardiomyopathy.  EF 40 to 45%.  On carvedilol 6.25 mg twice daily, Bumex 1 mg daily.  Status post AICD.  Appears compensated, volume status improved today compared to last clinic visist -Continue bumex 1 mg daily -Continue coreg 3.125 mg BID -Holding ACE/ARB/ARNI and aldactone in setting of renal dysfunction.  Has been referred to nephrology  CAD: Status post CABG x1 in 1990.  Denies any chest pain  -Continue aspirin, beta-blocker -Currently on simvastatin 40 mg daily, LDL 59, at goal less than 70  Hypertension: On  carvedilol 3.125 mg twice.  Appears controlled  Atrial fibrillation: On carvedilol 3.125 mg twice daily.  On Eliquis 2.5 mg twice daily.  CHADS-VASc 7 - Continue carvedilol - Continue Eliquis 2.5  mg BID, which is an appropriate dose for him given age greater than 58 and creatinine greater than 1.5  Leg pain: will check ABIs  Follow-up in 2 months   Medication Adjustments/Labs and Tests Ordered: Current medicines are reviewed at length with the patient today.  Concerns regarding medicines are outlined above.  Orders Placed This Encounter  Procedures  . VAS [place] LE ART SEG MULTI (Segm&LE Reynauds)   No orders of the defined types were placed in this encounter.   Patient Instructions  Medication Instructions:  Continue same medications *If you need a refill on your cardiac medications before your next appointment, please call your pharmacy*  Lab Work: None ordered   Testing/Procedures: Schedule ABI's   Follow-Up: At [facility], you and your health needs are our priority.  As part of our continuing mission to provide you with exceptional heart care, we have created designated Provider Care Teams.  These Care Teams include your primary Cardiologist (physician) and Advanced Practice Providers (APPs -  Physician Assistants and Nurse Practitioners) who all work together to provide you with the care you need, when you need it.  Your next appointment:  2 months   The format for your next appointment:  Office   Provider:  Dr.Baylin Cabal      Signed, Donato Heinz, MD  06/14/2019 11:50 AM    [place]

## 2019-06-14 ENCOUNTER — Other Ambulatory Visit (HOSPITAL_COMMUNITY): Payer: Self-pay | Admitting: Cardiology

## 2019-06-14 ENCOUNTER — Telehealth: Payer: Self-pay

## 2019-06-14 DIAGNOSIS — I739 Peripheral vascular disease, unspecified: Secondary | ICD-10-CM

## 2019-06-14 NOTE — Telephone Encounter (Signed)
-----   Message from [employer], PA-C sent at 06/13/2019  9:46 AM EST ----- Would you be able to call him and tell him to start taking an over the counter iron supplement? Once daily?  ----- Message ----- From: Interface, Lab In [place] Sent: 06/11/2019   1:40 PM EST To: Cassandra L Heilingoetter, PA-C

## 2019-06-14 NOTE — Telephone Encounter (Signed)
TC to Pt Per Cassie VM message left for Pt to return call to [facility].

## 2019-06-15 ENCOUNTER — Telehealth: Payer: Self-pay | Admitting: *Deleted

## 2019-06-15 NOTE — Telephone Encounter (Signed)
Pt returned call. Instructed to take OTC iron supplement once daily

## 2019-06-18 ENCOUNTER — Encounter (INDEPENDENT_AMBULATORY_CARE_PROVIDER_SITE_OTHER): Payer: Self-pay

## 2019-06-18 ENCOUNTER — Ambulatory Visit (HOSPITAL_COMMUNITY)
Admission: RE | Admit: 2019-06-18 | Discharge: 2019-06-18 | Disposition: A | Discharge status: Home / Self Care | Payer: Medicare Other | Source: Ambulatory Visit | Attending: Cardiovascular Disease | Admitting: Cardiovascular Disease

## 2019-06-18 ENCOUNTER — Encounter (HOSPITAL_COMMUNITY): Payer: 59

## 2019-06-18 ENCOUNTER — Other Ambulatory Visit: Payer: Self-pay | Admitting: Cardiology

## 2019-06-18 ENCOUNTER — Other Ambulatory Visit: Payer: Self-pay

## 2019-06-18 DIAGNOSIS — M79605 Pain in left leg: Secondary | ICD-10-CM

## 2019-06-18 DIAGNOSIS — M79604 Pain in right leg: Secondary | ICD-10-CM

## 2019-06-18 DIAGNOSIS — I739 Peripheral vascular disease, unspecified: Secondary | ICD-10-CM

## 2019-06-25 ENCOUNTER — Inpatient Hospital Stay (HOSPITAL_BASED_OUTPATIENT_CLINIC_OR_DEPARTMENT_OTHER): Payer: Medicare Other | Admitting: Physician Assistant

## 2019-06-25 ENCOUNTER — Inpatient Hospital Stay: Payer: Medicare Other

## 2019-06-25 ENCOUNTER — Encounter: Payer: Self-pay | Admitting: Physician Assistant

## 2019-06-25 ENCOUNTER — Other Ambulatory Visit: Payer: Self-pay

## 2019-06-25 VITALS — BP 122/64 | HR 78 | Temp 99.0°F | Resp 18 | Ht 68.0 in | Wt 223.7 lb

## 2019-06-25 DIAGNOSIS — D696 Thrombocytopenia, unspecified: Secondary | ICD-10-CM

## 2019-06-25 DIAGNOSIS — N189 Chronic kidney disease, unspecified: Secondary | ICD-10-CM

## 2019-06-25 DIAGNOSIS — D500 Iron deficiency anemia secondary to blood loss (chronic): Secondary | ICD-10-CM | POA: Diagnosis not present

## 2019-06-25 DIAGNOSIS — D693 Immune thrombocytopenic purpura: Secondary | ICD-10-CM | POA: Diagnosis not present

## 2019-06-25 DIAGNOSIS — S0990XA Unspecified injury of head, initial encounter: Secondary | ICD-10-CM | POA: Insufficient documentation

## 2019-06-25 DIAGNOSIS — Z7901 Long term (current) use of anticoagulants: Secondary | ICD-10-CM

## 2019-06-25 DIAGNOSIS — D649 Anemia, unspecified: Secondary | ICD-10-CM | POA: Insufficient documentation

## 2019-06-25 DIAGNOSIS — R361 Hematospermia: Secondary | ICD-10-CM

## 2019-06-25 DIAGNOSIS — R319 Hematuria, unspecified: Secondary | ICD-10-CM

## 2019-06-25 LAB — URINALYSIS, COMPLETE (UACMP) WITH MICROSCOPIC
Bacteria, UA: NONE SEEN
Bilirubin Urine: NEGATIVE
Glucose, UA: NEGATIVE mg/dL
Ketones, ur: NEGATIVE mg/dL
Leukocytes,Ua: NEGATIVE
Nitrite: NEGATIVE
Protein, ur: NEGATIVE mg/dL
Specific Gravity, Urine: 1.016 (ref 1.005–1.030)
pH: 5 (ref 5.0–8.0)

## 2019-06-25 LAB — CBC WITH DIFFERENTIAL (CANCER CENTER ONLY)
Abs Immature Granulocytes: 0.03 10*3/uL (ref 0.00–0.07)
Basophils Absolute: 0 10*3/uL (ref 0.0–0.1)
Basophils Relative: 0 %
Eosinophils Absolute: 0.1 10*3/uL (ref 0.0–0.5)
Eosinophils Relative: 2 %
HCT: 35 % — ABNORMAL LOW (ref 39.0–52.0)
Hemoglobin: 11.2 g/dL — ABNORMAL LOW (ref 13.0–17.0)
Immature Granulocytes: 1 %
Lymphocytes Relative: 12 %
Lymphs Abs: 0.7 10*3/uL (ref 0.7–4.0)
MCH: 30.2 pg (ref 26.0–34.0)
MCHC: 32 g/dL (ref 30.0–36.0)
MCV: 94.3 fL (ref 80.0–100.0)
Monocytes Absolute: 0.6 10*3/uL (ref 0.1–1.0)
Monocytes Relative: 9 %
Neutro Abs: 4.9 10*3/uL (ref 1.7–7.7)
Neutrophils Relative %: 76 %
Platelet Count: 71 10*3/uL — ABNORMAL LOW (ref 150–400)
RBC: 3.71 MIL/uL — ABNORMAL LOW (ref 4.22–5.81)
RDW: 14.8 % (ref 11.5–15.5)
WBC Count: 6.4 10*3/uL (ref 4.0–10.5)
nRBC: 0 % (ref 0.0–0.2)

## 2019-06-25 NOTE — Progress Notes (Signed)
[facility] OFFICE PROGRESS NOTE  [name], [place] [address]  DIAGNOSIS: Iron deficiency anemia and thrombocytopenia  PRIOR THERAPY: none  CURRENT THERAPY: OTC iron supplements p.o. daily.   INTERVAL HISTORY: Ricardo Payne 86 y.o. male returns to clinic today for a follow-up visit.  The patient was referred to the clinic evaluation of thrombocytopenia and anemia.  The patient is feeling fair today except for he continues to experience significant bilateral lower extremity swelling secondary to his CHF and CKD. He had a doppler ultrasound which was negative for DVT, reportedly. He is followed by cardiology for CHF and he has referral made for nephrology, but no appointment scheduled at this time.  He endorses weight gain and his bilateral lower extremity swelling is causing him significant achiness which is exacerbated when he lays down at nighttime. Because of this, the patient states that he has not been sleeping well.  On 06/21/2019, the patient states that he was standing up and fell asleep standing up which resulted in him falling and hitting his head on a heater that was on his floor.  The patient subsequently went to an urgent care who repaired a laceration on his left eyebrow.  The patient continues to have extensive bruising on his face and underneath his eyes bilaterally.  He is taking Eliquis currently. The patient denies any headache or visual changes.  The patient did not have any imaging performed of his head after his head trauma.  Regarding his anemia and thrombocytopenia, the patient denies any gingival bleeding, epistaxis, hemoptysis, or hematemesis.  The patient endorsed "blood in his semen" at his last appointment.  The patient is followed by urologist, Dr. Louis Meckel, who he saw this morning regarding this concern.  The patient states that he had a prostate exam performed today.  The patient states that his urologist is aware of  hematuria.  The patient will continue to follow with his urologist regarding his hematuria which is likely contributing to his iron deficiency anemia.  The patient is currently taking over-the-counter iron supplements and using stool softeners as prophylaxis for constipation.  The patient denies any melena.  He is unsure when his last colonoscopy was.  The patient states that he occasionally has hematochezia when he has a hard bowel movement secondary to hemorrhoids. The patient had several studies performed to evaluate his anemia and thrombocytopenia including a CBC, LDH, von Willebrand's panel, vitamin B12, hepatitis panel, coag studies, folate, ferritin, and iron studies, UA, and PSA. He is here for evaluation and to discuss the results of his lab studies.     MEDICAL HISTORY: Past Medical History:  Diagnosis Date  . Acquired thrombophilia ([facility])   . Age-related cataract of both eyes   . ASHD (arteriosclerotic heart disease)   . Benign prostatic hyperplasia   . CAD (coronary artery disease)    a. s/p CABG x1 in 1990  . Cerebellar stroke ([facility]) 07/02/2016  . Chronic combined systolic and diastolic CHF (congestive heart failure) ([facility])   . Chronic gout without tophus   . CKD (chronic kidney disease), stage IV ([facility])   . Essential hypertension   . ICD (implantable cardioverter-defibrillator) in place   . Ischemic cardiomyopathy   . Mixed hyperlipidemia   . PAF (paroxysmal atrial fibrillation) ([facility])   . Restless legs   . Secondary pulmonary arterial hypertension ([facility] [place])   . Thrombocytopenia ([facility])   . Vitamin D deficiency     ALLERGIES:  has No  Known Allergies.  MEDICATIONS:  Current Outpatient Medications  Medication Sig Dispense Refill  . allopurinol (ZYLOPRIM) 100 MG tablet Take 100 mg by mouth daily.    Marland Kitchen apixaban (ELIQUIS) 2.5 MG TABS tablet Take 2.5 mg by mouth 2 (two) times daily.    . bumetanide (BUMEX) 1 MG tablet Take 1 tablet (1 mg total) by mouth daily. 90 tablet 1  .  carvedilol (COREG) 6.25 MG tablet Take 3.125 mg by mouth 2 (two) times daily with a meal. 1/ 2  tablet (3.114m) twice daily    . DUREZOL 0.05 % EMUL Place 1 drop into the right eye 2 (two) times daily. Until Wed, then 1 drop daily    . ergocalciferol (VITAMIN D2) 1.25 MG (50000 UT) capsule Take 50,000 Units by mouth once a week. Sunday    . Folic Acid-Vit [id number](FOLBEE) 2.5-25-1 MG TABS tablet Take 1 tablet by mouth daily.    . pramipexole (MIRAPEX) 0.25 MG tablet Take 0.25 mg by mouth 2 (two) times daily.    . simvastatin (ZOCOR) 40 MG tablet Take 40 mg by mouth daily at 6 PM.     No current facility-administered medications for this visit.    SURGICAL HISTORY:  Past Surgical History:  Procedure Laterality Date  . CHOLECYSTECTOMY    . CORONARY ARTERY BYPASS GRAFT    . PACEMAKER IMPLANT  10/2018    REVIEW OF SYSTEMS:   Review of Systems  Constitutional: Negative for appetite change, chills, fatigue, fever and unexpected weight change.  HENT: Negative for mouth sores, nosebleeds, sore throat and trouble swallowing.   Eyes: Bilateral bruising. Negative for eye problems and icterus.  Respiratory: Negative for cough, hemoptysis, shortness of breath and wheezing.  Cardiovascular: Positive for bilateral leg swelling. Negative for chest pain. Gastrointestinal: Negative for abdominal pain, constipation, diarrhea, nausea and vomiting.  Genitourinary: Negative for bladder incontinence, difficulty urinating, dysuria, frequency and hematuria.   Musculoskeletal: Negative for back pain, gait problem, neck pain and neck stiffness.  Skin: Positive for laceration on left eyebrow.  Neurological: Negative for dizziness, extremity weakness, gait problem, headaches, light-headedness and seizures.  Hematological: Positive for bruising on face and chest. Negative for adenopathy.  Psychiatric/Behavioral: Negative for confusion, depression and sleep disturbance. The patient is not nervous/anxious.      PHYSICAL EXAMINATION:  Blood pressure 122/64, pulse 78, temperature 99 F (37.2 C), temperature source Temporal, resp. rate 18, height 5' 8"  (1.727 m), weight 223 lb 11.2 oz (101.5 kg), SpO2 99 %.  ECOG PERFORMANCE STATUS: 0 - Asymptomatic  Physical Exam  Constitutional: Oriented to person, place, and time and well-developed, well-nourished, and in no distress.  HENT:  Head: Normocephalic. Laceration over left eyebrow. Black eyes bilaterally (L>R) see picture below. Mouth/Throat: Oropharynx is clear and moist. No oropharyngeal exudate.  Eyes: Conjunctivae are normal. Right eye exhibits no discharge. Left eye exhibits no discharge. No scleral icterus.  Neck: Normal range of motion. Neck supple.  Cardiovascular: Normal rate, irregular rhythm, normal heart sounds and intact distal pulses.   Pulmonary/Chest: Effort normal and breath sounds normal. No respiratory distress. No wheezes. No rales.  Abdominal: Soft. Bowel sounds are normal. Exhibits no distension and no mass. There is no tenderness.  Musculoskeletal: Significant bilateral lower extremity swelling. Normal range of motion. .Marland Kitchen Lymphadenopathy:    No cervical adenopathy.  Neurological: Alert and oriented to person, place, and time. Exhibits normal muscle tone. Gait normal. Coordination normal.  Skin: Bruising as shown below. Large bruise an anterior chest. Skin  is warm and dry. No rash noted. Not diaphoretic. No pallor.  Psychiatric: Mood, memory and judgment normal.  Vitals reviewed.    LABORATORY DATA: Lab Results  Component Value Date   WBC 6.4 06/25/2019   HGB 11.2 (L) 06/25/2019   HCT 35.0 (L) 06/25/2019   MCV 94.3 06/25/2019   PLT 71 (L) 06/25/2019      Chemistry      Component Value Date/Time   NA 142 06/11/2019 1323   NA 144 05/28/2019 1405   K 4.4 06/11/2019 1323   CL 106 06/11/2019 1323   CO2 26 06/11/2019 1323   BUN 55 (H) 06/11/2019 1323   BUN 47 (H) 05/28/2019 1405   CREATININE 1.84 (H) 06/11/2019  1323      Component Value Date/Time   CALCIUM 8.7 (L) 06/11/2019 1323   ALKPHOS 140 (H) 06/11/2019 1323   AST 26 06/11/2019 1323   ALT 25 06/11/2019 1323   BILITOT 1.5 (H) 06/11/2019 1323       RADIOGRAPHIC STUDIES:  VAS [place] LOWER EXT ART SEG MULTI (SEGMENTALS & LE RAYNAUDS)  Result Date: 06/19/2019 LOWER EXTREMITY DOPPLER STUDY Indications: Patient has complaints of pain in his legs which he describes as an              aching pain that sometimes keeps him up at night. Denies worsening              of pain with walking or activity. Also has significant edema of the              bilateral calf/ankles. High Risk Factors: Past history of smoking.  Vascular Interventions: CHF, cardiomyopathy. Performing Technologist: Mariane Masters RVT  Examination Guidelines: A complete evaluation includes at minimum, Doppler waveform signals and systolic blood pressure reading at the level of bilateral brachial, anterior tibial, and posterior tibial arteries, when vessel segments are accessible. Bilateral testing is considered an integral part of a complete examination. Photoelectric Plethysmograph (PPG) waveforms and toe systolic pressure readings are included as required and additional duplex testing as needed. Limited examinations for reoccurring indications may be performed as noted.  ABI Findings: +---------+------------------+-----+---------+--------+ Right    Rt Pressure (mmHg)IndexWaveform Comment  +---------+------------------+-----+---------+--------+ Brachial 132                                      +---------+------------------+-----+---------+--------+ CFA                             triphasic         +---------+------------------+-----+---------+--------+ Popliteal                       triphasic         +---------+------------------+-----+---------+--------+ ATA      254               1.92 triphasic         +---------+------------------+-----+---------+--------+ PTA       174               1.32 triphasic         +---------+------------------+-----+---------+--------+ PERO     254               1.92 biphasic          +---------+------------------+-----+---------+--------+ Alinda Money  0.76 Normal            +---------+------------------+-----+---------+--------+ +---------+------------------+-----+---------------+-------------------+ Left     Lt Pressure (mmHg)IndexWaveform       Comment             +---------+------------------+-----+---------------+-------------------+ Brachial 131                                                       +---------+------------------+-----+---------------+-------------------+ CFA                             triphasic                          +---------+------------------+-----+---------------+-------------------+ Popliteal                       triphasic                          +---------+------------------+-----+---------------+-------------------+ ATA      254               1.92 triphasic                          +---------+------------------+-----+---------------+-------------------+ PTA      194               1.47 triphasic                          +---------+------------------+-----+---------------+-------------------+ PERO     254               1.92 nearly biphasicdifficult to record +---------+------------------+-----+---------------+-------------------+ Great Toe104               0.79 Normal                             +---------+------------------+-----+---------------+-------------------+ +-------+-----------+-----------+------------+------------+ ABI/TBIToday's ABIToday's TBIPrevious ABIPrevious TBI +-------+-----------+-----------+------------+------------+ Right  [place]         0.76                                +-------+-----------+-----------+------------+------------+ Left   [place]         0.79                                 +-------+-----------+-----------+------------+------------+  Summary: Right: Resting right ankle-brachial index indicates noncompressible right lower extremity arteries. The right toe-brachial index is normal. Tibial waveforms are multiphasic and within normal limits, suggesting the absence of any significant peripheral arterial disease. Left: Resting left ankle-brachial index indicates noncompressible left lower extremity arteries. The left toe-brachial index is normal. Tibial waveforms are multiphasic and within normal limits, suggesting the absence of any significant peripheral arterial disease.  *See table(s) above for measurements and observations.  Electronically signed by Ena Dawley MD on 06/19/2019 at 9:43:56 AM.    Final      ASSESSMENT/PLAN:  This is a very pleasant 86 year old Caucasian male with iron deficiency anemia secondary to blood loss and thrombocytopenia likely secondary to ITP.  The patient  was seen with Dr. Julien Nordmann today.  The patient had multiple lab studies performed recently to evaluate his condition including a CBC, LDH, von Willebrand's panel, vitamin B12, hepatitis panel, coag studies, folate, ferritin, and iron studies.  At the patient's last appointment, he also endorsed hematospermia, so he also had a UA and PSA performed today as well.   Regarding the patient's anemia, the patient's multiple lab studies were unremarkable except for stable and persistent anemia with a Hbg of 11.2 and stable but persistent thrombocytopenia with a platelet count of 71k, and iron deficiency anemia which is likely related to his hematuria seen on his UA performed today in the clinic  The patient is followed by urologist, Dr. Louis Meckel, for his hematuria. The patient had an appointment earlier today with his urologist. His PSA is pending. If abnormal, I will reach out to his urologist and route the results to his office.  The patient has recently started OTC oral iron supplements for his  anemia. He takes stool softeners for bowel prophylaxis for constipation.   We will check a FOBT to also ensure that the patient does not have GI blood loss contributing to his anemia. The patient is unsure when his last colonoscopy was.    The patient also has CKD. He is waiting for an appointment with his nephrologist.   Regarding the patient's significant facial bruising, his anti-coagulation use, and his age, we will arrange for a stat CT of the head without contrast to ensure no intracranial bleeding.   He will continue to follow with his cardiologist, Dr. Gardiner Rhyme for his CHF. Recommended that he reach out to their office regarding his weight gain and lower extremity swelling.   We will see him back for a follow up visit in 3 months for evaluation and repeat CBC, iron studies, ferritin, and LDH. I will personally call the patient sooner pending the results of his FOBT and CT of the head if abnormal.    The patient was advised to call immediately if he has any concerning symptoms in the interval. The patient voices understanding of current disease status and treatment options and is in agreement with the current care plan. All questions were answered. The patient knows to call the clinic with any problems, questions or concerns. We can certainly see the patient much sooner if necessary     Orders Placed This Encounter  Procedures  . CT Head Wo Contrast    Standing Status:   Future    Standing Expiration Date:   06/24/2020    Order Specific Question:   ** REASON FOR EXAM (FREE TEXT)    Answer:   Recent Falls. Significant bruising on face/black eyes. Elderly on blood thinner (eliquis).    Order Specific Question:   Preferred imaging location?    Answer:   [facility]    Order Specific Question:   Radiology Contrast Protocol - do NOT remove file path    Answer:   [email or web address]  . CBC with Differential ([facility] Only)    Standing Status:    Future    Standing Expiration Date:   06/24/2020  . Iron and TIBC    Standing Status:   Future    Standing Expiration Date:   06/24/2020  . Ferritin    Standing Status:   Future    Standing Expiration Date:   06/24/2020  . Lactate dehydrogenase (LDH)    Standing Status:   Future    Standing Expiration Date:   06/24/2020  .  Occult blood card to lab, stool    Standing Status:   Future    Standing Expiration Date:   06/24/2020  . Occult blood card to lab, stool    Standing Status:   Future    Standing Expiration Date:   06/24/2020  . Occult blood card to lab, stool    Standing Status:   Future    Standing Expiration Date:   06/24/2020     Tobe Sos Duel Conrad, PA-C 06/25/19  ADDENDUM: Hematology/Oncology Attending: I had a face-to-face encounter with the patient today.  I recommended his care plan.  This is a very pleasant 86 years old white male presented for evaluation of persistent anemia likely of iron deficiency in addition to idiopathic thrombocytopenic purpura.  The patient is currently on anticoagulation because of his congestive heart failure and atrial fibrillation.  He had extensive lab work for evaluation of his anemia that was unremarkable. I recommended for the patient to continue on the oral iron tablets for now. For the idiopathic thrombocytopenic purpura, we will continue to monitor this closely and consider the patient for treatment if he has platelet count less than 30,000 or if he has significant bleeding issues in the absence of anticoagulation. He had a recent fall at home and has significant ecchymosis on the left artery and head.  I recommended for the patient to have CT of the head without contrast to rule out any intracranial hemorrhage. We will see the patient back for follow-up visit in 3 months for evaluation and repeat blood work.  If he has any worsening anemia and thrombocytopenia, I may consider him for a bone marrow biopsy and aspirate in the future. He  was advised to call immediately if he has any concerning symptoms in the interval.  Disclaimer: This note was dictated with voice recognition software. Similar sounding words can inadvertently be transcribed and may be missed upon review. Eilleen Kempf, MD 06/25/19

## 2019-06-26 ENCOUNTER — Ambulatory Visit (HOSPITAL_COMMUNITY): Payer: 59

## 2019-06-26 LAB — URINE CULTURE: Culture: <10000 — AB

## 2019-06-26 LAB — PSA, TOTAL AND FREE
PSA, Free Pct: 55.4 %
PSA, Free: 1.55 ng/mL
Prostate Specific Ag, Serum: 2.8 ng/mL (ref 0.0–4.0)

## 2019-06-27 ENCOUNTER — Ambulatory Visit (HOSPITAL_COMMUNITY)
Admission: RE | Admit: 2019-06-27 | Discharge: 2019-06-27 | Disposition: A | Discharge status: Home / Self Care | Payer: Medicare Other | Source: Ambulatory Visit | Attending: Physician Assistant | Admitting: Physician Assistant

## 2019-06-27 ENCOUNTER — Other Ambulatory Visit: Payer: Self-pay

## 2019-06-27 ENCOUNTER — Telehealth: Payer: Self-pay | Admitting: Internal Medicine

## 2019-06-27 ENCOUNTER — Telehealth: Payer: Self-pay | Admitting: Physician Assistant

## 2019-06-27 DIAGNOSIS — S0990XA Unspecified injury of head, initial encounter: Secondary | ICD-10-CM | POA: Diagnosis present

## 2019-06-27 NOTE — Telephone Encounter (Signed)
Spoke to the radiologist regarding this patient's CT scan who stated this patient had very mild intracranial bleeding after his fall. Did not recommend emergency room visit and stated findings do not warrant admission. No hematoma or midline shift. Discussed the scan with Dr. Julien Nordmann who is in agreement.   I called and spoke to the patient about these findings. He is asymptomatic. Cautioned him that if he develops new neurological symptoms including but not limited to headaches, visual changes, speech disturbances, gait disturbances, extremity weakness, nausea/vomiting, neck stiffness, etc, that he needs to seek immediate emergency evaluation. He expressed understanding.   Will reach out to his cardiologist who prescribes his eliquis for his history of PAF for their recommendations regarding holding eliquis.

## 2019-06-27 NOTE — Telephone Encounter (Signed)
Scheduled per los. Called and left msg. Mailed printout  °

## 2019-06-28 ENCOUNTER — Telehealth: Payer: Self-pay | Admitting: Physician Assistant

## 2019-06-28 NOTE — Telephone Encounter (Signed)
Spoke to one of my colleagues, neuro-oncologist, as well as the patient's cardiologist regarding recommendations for his blood thinner in light of his recent abnormal head CT imaging findings after his head trauma. The recommendation is for the patient to hold his blood thinner for about ~4 days. The patient has a history of PAF with high CHAD-VASC score and a CVA in 2018, therefore, he is at risk for thromboembolic event so recommendation is minimize duration off his blood thinner.   I spoke to the patient and instructed him to hold his blood thinner for 4 days. Discussed signs and symptoms that would warrant emergency room visit for re-imaging including headaches, ataxia, changes in speech, visual changes, etc. He expressed understanding.

## 2019-07-03 NOTE — Progress Notes (Deleted)
Cardiology Office Note:    Date:  07/03/2019   ID:  Ricardo Payne, Ricardo Payne [birth year], MRN [id number]  PCP:  [name], FNP  Cardiologist:  Donato Heinz, MD  Electrophysiologist:  Cristopher Peru, MD   Referring MD: Jillyn Ledger, FNP  No chief complaint on file.   History of Present Illness:    Ricardo Payne is a 86 y.o. male with a hx of CAD status post CABG x1 in 1990, ischemic cardiomyopathy status post ICD 02/2018, atrial fibrillation, hypertension, hyperlipidemia, CVA 07/2016, CKD stage III who presents for follow-up.  He was seen initially on 03/26/2019.  He had moved from [place] to [place] in September 2020.  He reported no issues, states that he had had issues with lower extremity edema earlier in the year but started on Bumex 1 mg daily in May and symptoms have been well controlled.  TTE from 07/2018 in [place] showed EF 25 to 30%.  TTE here in 04/2019 showed improvement in EF to 40 to 45%.  At initial appointment he was taking carvedilol 625 mg twice daily and Aldactone 25 mg daily.  Records from his cardiologist were obtained and indicated that he was not on an ACE/ARB/Entresto due to CKD (baseline creatinine around 2.0).  Records from cardiologist indicated that spironolactone was tried and discontinued secondary to hyperkalemia.  However he was still taking when seen in clinic.  Labs showed creatinine 2.04, potassium 5.1; he was told to discontinue his spironolactone.  He presented to the clinic on 05/18/2019 with worsening shortness of breath and lower extremity edema.  His Bumex was increased to 2 mg twice daily.  Follow-up appointment on 12/21 with Jory Sims, he reported significant improvement in his symptoms.  However his renal function was worsening, and he was recommended to go to the ER.  He was briefly admitted to the hospital, Bumex was held, and creatinine improved.  Since last clinic visit, he fell and hit his head on the floor, developed  extensive bruising.  HCT shows small amount of wispy blood in subdural space.  Seen by heamtology, discussed with neuro-oncologist and remcommended holding ELiquis x 4 days,  ***Today he reports that he has been doing well.  He reports that he is weighing himself daily and weight stable at home.  Reports his dyspnea is improved.  Does have some dyspnea at night.  Leg swelling is also improved, though continues to have some swelling in his legs.  States that his bank like today is pain in his legs, describes it as an aching pain in his legs sometimes keeps him up at night.     Past Medical History:  Diagnosis Date  . Acquired thrombophilia ([place])   . Age-related cataract of both eyes   . ASHD (arteriosclerotic heart disease)   . Benign prostatic hyperplasia   . CAD (coronary artery disease)    a. s/p CABG x1 in 1990  . Cerebellar stroke ([place]) 07/02/2016  . Chronic combined systolic and diastolic CHF (congestive heart failure) ([place])   . Chronic gout without tophus   . CKD (chronic kidney disease), stage IV ([place])   . Essential hypertension   . ICD (implantable cardioverter-defibrillator) in place   . Ischemic cardiomyopathy   . Mixed hyperlipidemia   . PAF (paroxysmal atrial fibrillation) ([place])   . Restless legs   . Secondary pulmonary arterial hypertension ([place])   . Thrombocytopenia ([place])   . Vitamin D deficiency     Past Surgical History:  Procedure Laterality  Date  . CHOLECYSTECTOMY    . CORONARY ARTERY BYPASS GRAFT    . PACEMAKER IMPLANT  10/2018    Current Medications: No outpatient medications have been marked as taking for the 07/05/19 encounter (Appointment) with Donato Heinz, MD.     Allergies:   Patient has no known allergies.   Social History   Socioeconomic History  . Marital status: Widowed    Spouse name: Not on file  . Number of children: 2  . Years of education: Not on file  . Highest education level: Not on file  Occupational History  . Not on  file  Tobacco Use  . Smoking status: Former Smoker    Quit date: 1988    Years since quitting: 33.1  . Smokeless tobacco: Never Used  Substance and Sexual Activity  . Alcohol use: Yes    Comment: rare  . Drug use: Never  . Sexual activity: Not on file  Other Topics Concern  . Not on file  Social History Narrative  . Not on file   Social Determinants of Health   Financial Resource Strain:   . Difficulty of Paying Living Expenses: Not on file  Food Insecurity:   . Worried About Charity fundraiser in the Last Year: Not on file  . Ran Out of Food in the Last Year: Not on file  Transportation Needs:   . Lack of Transportation (Medical): Not on file  . Lack of Transportation (Non-Medical): Not on file  Physical Activity:   . Days of Exercise per Week: Not on file  . Minutes of Exercise per Session: Not on file  Stress:   . Feeling of Stress : Not on file  Social Connections:   . Frequency of Communication with Friends and Family: Not on file  . Frequency of Social Gatherings with Friends and Family: Not on file  . Attends Religious Services: Not on file  . Active Member of Clubs or Organizations: Not on file  . Attends Archivist Meetings: Not on file  . Marital Status: Not on file     Family History: The patient's family history includes Heart attack in his brother; Heart disease in his father; Hypertension in his mother; Suicidality in his mother; Valvular heart disease in his father.  ROS:   Please see the history of present illness.     All other systems reviewed and are negative.  EKGs/Labs/Other Studies Reviewed:    The following studies were reviewed today:   EKG:  EKG is  Not ordered today.  The ekg ordered 05/18/19  demonstrates normal sinus rhythm, rate 71, left bundle branch block, first-degree AV block  TTE 04/02/19:  1. Left ventricular ejection fraction, by visual estimation, is 40 to 45%. The left ventricle has moderately decreased function.  Left ventricular septal wall thickness was normal. Mildly increased left ventricular posterior wall thickness. There is  borderline left ventricular hypertrophy.  2. Inferior and inferoseptal akinesis and thinning.  3. Elevated left atrial and left ventricular end-diastolic pressures.  4. Left ventricular diastolic parameters are consistent with Grade III diastolic dysfunction (restrictive).  5. Global right ventricle has mildly reduced systolic function.The right ventricular size is normal. No increase in right ventricular wall thickness.  6. Left atrial size was severely dilated.  7. Right atrial size was moderately dilated.  8. The mitral valve is abnormal. Mild mitral valve regurgitation.  9. The tricuspid valve is grossly normal. Tricuspid valve regurgitation moderate. 10. Aortic valve area, by VTI  measures 1.60 cm. 11. Aortic valve mean gradient measures 8.0 mmHg. 12. Aortic valve peak gradient measures 13.1 mmHg. 13. The aortic valve is tricuspid. Aortic valve regurgitation is trivial. Mild aortic valve stenosis. 14. The pulmonic valve was grossly normal. Pulmonic valve regurgitation is mild. 15. Moderately elevated pulmonary artery systolic pressure. 16. The inferior vena cava is dilated in size with >50% respiratory variability, suggesting right atrial pressure of 8 mmHg. 17. The average left ventricular global longitudinal strain is -10.7 %. 18. A ICD is visualized.  Recent Labs: 03/26/2019: Magnesium 2.1 05/21/2019: NT-Pro BNP 6,191 05/22/2019: B Natriuretic Peptide 735.0 06/11/2019: ALT 25; BUN 55; Creatinine 1.84; Potassium 4.4; Sodium 142 06/25/2019: Hemoglobin 11.2; Platelet Count 71  Recent Lipid Panel    Component Value Date/Time   CHOL 144 03/26/2019 1425   TRIG 70 03/26/2019 1425   HDL 71 03/26/2019 1425   CHOLHDL 2.0 03/26/2019 1425   LDLCALC 59 03/26/2019 1425    Physical Exam:    VS:  There were no vitals taken for this visit.    Wt Readings from Last 3  Encounters:  06/25/19 223 lb 11.2 oz (101.5 kg)  06/13/19 220 lb (99.8 kg)  06/11/19 218 lb 1.6 oz (98.9 kg)     GEN:  Well nourished, well developed in no acute distress HEENT: Normal NECK: No JVD LYMPHATICS: No lymphadenopathy CARDIAC: RRR, no murmurs, rubs, gallops RESPIRATORY:  Clear to auscultation without rales, wheezing or rhonchi  ABDOMEN: Soft, non-tender, non-distended MUSCULOSKELETAL:  1+ LE edema SKIN: Warm and dry NEUROLOGIC:  Alert and oriented x 3 PSYCHIATRIC:  Normal affect   ASSESSMENT:    No diagnosis found. PLAN:     Chronic systolic heart failure: ischemic cardiomyopathy.  EF 40 to 45%.  On carvedilol 6.25 mg twice daily, Bumex 1 mg daily.  Status post AICD.  Appears compensated, volume status improved today compared to last clinic visist -Continue bumex 1 mg daily -Continue coreg 3.125 mg BID -Holding ACE/ARB/ARNI and aldactone in setting of renal dysfunction.  Has been referred to nephrology  CAD: Status post CABG x1 in 1990.  Denies any chest pain  -Continue aspirin, beta-blocker -Currently on simvastatin 40 mg daily, LDL 59, at goal less than 70  Hypertension: On carvedilol 3.125 mg twice.  Appears controlled  Atrial fibrillation: On carvedilol 3.125 mg twice daily.  On Eliquis 2.5 mg twice daily.  CHADS-VASc 7 - Continue carvedilol - Continue Eliquis 2.5 mg BID, which is an appropriate dose for him given age greater than 35 and creatinine greater than 1.5  Leg pain: normal ABIs  Follow-up in 2 months   Medication Adjustments/Labs and Tests Ordered: Current medicines are reviewed at length with the patient today.  Concerns regarding medicines are outlined above.  No orders of the defined types were placed in this encounter.  No orders of the defined types were placed in this encounter.   There are no Patient Instructions on file for this visit.   Signed, Donato Heinz, MD  07/03/2019 6:49 PM    [facility]

## 2019-07-05 ENCOUNTER — Ambulatory Visit: Payer: 59 | Admitting: Cardiology

## 2019-07-06 ENCOUNTER — Emergency Department (HOSPITAL_COMMUNITY): Payer: Medicare Other

## 2019-07-06 ENCOUNTER — Encounter (HOSPITAL_COMMUNITY): Payer: Self-pay | Admitting: Emergency Medicine

## 2019-07-06 ENCOUNTER — Inpatient Hospital Stay (HOSPITAL_COMMUNITY): Payer: Medicare Other

## 2019-07-06 ENCOUNTER — Inpatient Hospital Stay: Payer: Medicare Other | Attending: Physician Assistant

## 2019-07-06 ENCOUNTER — Inpatient Hospital Stay (HOSPITAL_COMMUNITY)
Admission: EM | Admit: 2019-07-06 | Discharge: 2019-07-12 | DRG: 291 | Disposition: A | Discharge status: Skilled Nursing Facility | Payer: Medicare Other | Attending: Student in an Organized Health Care Education/Training Program | Admitting: Student in an Organized Health Care Education/Training Program

## 2019-07-06 ENCOUNTER — Other Ambulatory Visit: Payer: Self-pay

## 2019-07-06 DIAGNOSIS — E785 Hyperlipidemia, unspecified: Secondary | ICD-10-CM

## 2019-07-06 DIAGNOSIS — N170 Acute kidney failure with tubular necrosis: Secondary | ICD-10-CM | POA: Diagnosis present

## 2019-07-06 DIAGNOSIS — Z8249 Family history of ischemic heart disease and other diseases of the circulatory system: Secondary | ICD-10-CM

## 2019-07-06 DIAGNOSIS — R0602 Shortness of breath: Secondary | ICD-10-CM

## 2019-07-06 DIAGNOSIS — I130 Hypertensive heart and chronic kidney disease with heart failure and stage 1 through stage 4 chronic kidney disease, or unspecified chronic kidney disease: Principal | ICD-10-CM

## 2019-07-06 DIAGNOSIS — Z87891 Personal history of nicotine dependence: Secondary | ICD-10-CM

## 2019-07-06 DIAGNOSIS — I959 Hypotension, unspecified: Secondary | ICD-10-CM | POA: Diagnosis not present

## 2019-07-06 DIAGNOSIS — R0902 Hypoxemia: Secondary | ICD-10-CM

## 2019-07-06 DIAGNOSIS — M1A9XX0 Chronic gout, unspecified, without tophus (tophi): Secondary | ICD-10-CM | POA: Diagnosis present

## 2019-07-06 DIAGNOSIS — R188 Other ascites: Secondary | ICD-10-CM | POA: Diagnosis present

## 2019-07-06 DIAGNOSIS — J9811 Atelectasis: Secondary | ICD-10-CM | POA: Diagnosis present

## 2019-07-06 DIAGNOSIS — N400 Enlarged prostate without lower urinary tract symptoms: Secondary | ICD-10-CM | POA: Diagnosis present

## 2019-07-06 DIAGNOSIS — Z951 Presence of aortocoronary bypass graft: Secondary | ICD-10-CM | POA: Diagnosis not present

## 2019-07-06 DIAGNOSIS — M109 Gout, unspecified: Secondary | ICD-10-CM

## 2019-07-06 DIAGNOSIS — R609 Edema, unspecified: Secondary | ICD-10-CM | POA: Diagnosis not present

## 2019-07-06 DIAGNOSIS — Z20822 Contact with and (suspected) exposure to covid-19: Secondary | ICD-10-CM | POA: Diagnosis present

## 2019-07-06 DIAGNOSIS — N184 Chronic kidney disease, stage 4 (severe): Secondary | ICD-10-CM | POA: Diagnosis present

## 2019-07-06 DIAGNOSIS — J9601 Acute respiratory failure with hypoxia: Secondary | ICD-10-CM | POA: Diagnosis present

## 2019-07-06 DIAGNOSIS — I5023 Acute on chronic systolic (congestive) heart failure: Secondary | ICD-10-CM | POA: Diagnosis present

## 2019-07-06 DIAGNOSIS — I493 Ventricular premature depolarization: Secondary | ICD-10-CM | POA: Diagnosis not present

## 2019-07-06 DIAGNOSIS — N183 Chronic kidney disease, stage 3 unspecified: Secondary | ICD-10-CM

## 2019-07-06 DIAGNOSIS — N179 Acute kidney failure, unspecified: Secondary | ICD-10-CM | POA: Diagnosis present

## 2019-07-06 DIAGNOSIS — I2510 Atherosclerotic heart disease of native coronary artery without angina pectoris: Secondary | ICD-10-CM | POA: Diagnosis present

## 2019-07-06 DIAGNOSIS — N281 Cyst of kidney, acquired: Secondary | ICD-10-CM | POA: Diagnosis present

## 2019-07-06 DIAGNOSIS — N189 Chronic kidney disease, unspecified: Secondary | ICD-10-CM | POA: Diagnosis present

## 2019-07-06 DIAGNOSIS — D696 Thrombocytopenia, unspecified: Secondary | ICD-10-CM | POA: Diagnosis not present

## 2019-07-06 DIAGNOSIS — R509 Fever, unspecified: Secondary | ICD-10-CM | POA: Diagnosis not present

## 2019-07-06 DIAGNOSIS — E873 Alkalosis: Secondary | ICD-10-CM

## 2019-07-06 DIAGNOSIS — Z515 Encounter for palliative care: Secondary | ICD-10-CM

## 2019-07-06 DIAGNOSIS — R58 Hemorrhage, not elsewhere classified: Secondary | ICD-10-CM | POA: Diagnosis not present

## 2019-07-06 DIAGNOSIS — B370 Candidal stomatitis: Secondary | ICD-10-CM | POA: Diagnosis not present

## 2019-07-06 DIAGNOSIS — E875 Hyperkalemia: Secondary | ICD-10-CM | POA: Diagnosis present

## 2019-07-06 DIAGNOSIS — K7460 Unspecified cirrhosis of liver: Secondary | ICD-10-CM

## 2019-07-06 DIAGNOSIS — N289 Disorder of kidney and ureter, unspecified: Secondary | ICD-10-CM | POA: Diagnosis not present

## 2019-07-06 DIAGNOSIS — Z8673 Personal history of transient ischemic attack (TIA), and cerebral infarction without residual deficits: Secondary | ICD-10-CM

## 2019-07-06 DIAGNOSIS — I255 Ischemic cardiomyopathy: Secondary | ICD-10-CM | POA: Diagnosis present

## 2019-07-06 DIAGNOSIS — I5043 Acute on chronic combined systolic (congestive) and diastolic (congestive) heart failure: Secondary | ICD-10-CM | POA: Diagnosis present

## 2019-07-06 DIAGNOSIS — G2581 Restless legs syndrome: Secondary | ICD-10-CM | POA: Diagnosis present

## 2019-07-06 DIAGNOSIS — D693 Immune thrombocytopenic purpura: Secondary | ICD-10-CM | POA: Diagnosis present

## 2019-07-06 DIAGNOSIS — Z95810 Presence of automatic (implantable) cardiac defibrillator: Secondary | ICD-10-CM

## 2019-07-06 DIAGNOSIS — Z7901 Long term (current) use of anticoagulants: Secondary | ICD-10-CM

## 2019-07-06 DIAGNOSIS — Z9181 History of falling: Secondary | ICD-10-CM

## 2019-07-06 DIAGNOSIS — E782 Mixed hyperlipidemia: Secondary | ICD-10-CM | POA: Diagnosis present

## 2019-07-06 DIAGNOSIS — Z7189 Other specified counseling: Secondary | ICD-10-CM

## 2019-07-06 DIAGNOSIS — D500 Iron deficiency anemia secondary to blood loss (chronic): Secondary | ICD-10-CM

## 2019-07-06 DIAGNOSIS — I480 Paroxysmal atrial fibrillation: Secondary | ICD-10-CM | POA: Diagnosis present

## 2019-07-06 DIAGNOSIS — E559 Vitamin D deficiency, unspecified: Secondary | ICD-10-CM | POA: Diagnosis present

## 2019-07-06 DIAGNOSIS — D631 Anemia in chronic kidney disease: Secondary | ICD-10-CM | POA: Diagnosis present

## 2019-07-06 DIAGNOSIS — Z79899 Other long term (current) drug therapy: Secondary | ICD-10-CM

## 2019-07-06 DIAGNOSIS — Z87820 Personal history of traumatic brain injury: Secondary | ICD-10-CM

## 2019-07-06 LAB — BASIC METABOLIC PANEL
Anion gap: 15 (ref 5–15)
Anion gap: 17 — ABNORMAL HIGH (ref 5–15)
BUN: 70 mg/dL — ABNORMAL HIGH (ref 8–23)
BUN: 83 mg/dL — ABNORMAL HIGH (ref 8–23)
CO2: 20 mmol/L — ABNORMAL LOW (ref 22–32)
CO2: 20 mmol/L — ABNORMAL LOW (ref 22–32)
Calcium: 9.2 mg/dL (ref 8.9–10.3)
Calcium: 8.9 mg/dL (ref 8.9–10.3)
Chloride: 101 mmol/L (ref 98–111)
Chloride: 98 mmol/L (ref 98–111)
Creatinine, Ser: 3.82 mg/dL — ABNORMAL HIGH (ref 0.61–1.24)
Creatinine, Ser: 4.12 mg/dL — ABNORMAL HIGH (ref 0.61–1.24)
GFR calc Af Amer: 16 mL/min — ABNORMAL LOW (ref >60)
GFR calc Af Amer: 14 mL/min — ABNORMAL LOW (ref >60)
GFR calc non Af Amer: 13 mL/min — ABNORMAL LOW (ref >60)
GFR calc non Af Amer: 12 mL/min — ABNORMAL LOW (ref >60)
Glucose, Bld: 158 mg/dL — ABNORMAL HIGH (ref 70–99)
Glucose, Bld: 137 mg/dL — ABNORMAL HIGH (ref 70–99)
Potassium: 4.9 mmol/L (ref 3.5–5.1)
Potassium: 4.7 mmol/L (ref 3.5–5.1)
Sodium: 136 mmol/L (ref 135–145)
Sodium: 135 mmol/L (ref 135–145)

## 2019-07-06 LAB — MAGNESIUM
Magnesium: 2.2 mg/dL (ref 1.7–2.4)
Magnesium: 2.4 mg/dL (ref 1.7–2.4)

## 2019-07-06 LAB — BLOOD GAS, ARTERIAL
Acid-base deficit: 3.1 mmol/L — ABNORMAL HIGH (ref 0.0–2.0)
Bicarbonate: 19.7 mmol/L — ABNORMAL LOW (ref 20.0–28.0)
FIO2: 32
O2 Saturation: 97.2 %
Patient temperature: 37
pCO2 arterial: 25.9 mmHg — ABNORMAL LOW (ref 32.0–48.0)
pH, Arterial: 7.493 — ABNORMAL HIGH (ref 7.350–7.450)
pO2, Arterial: 91.5 mmHg (ref 83.0–108.0)

## 2019-07-06 LAB — OCCULT BLOOD X 1 CARD TO LAB, STOOL
Fecal Occult Bld: NEGATIVE
Fecal Occult Bld: NEGATIVE
Fecal Occult Bld: NEGATIVE

## 2019-07-06 LAB — CBC
HCT: 37.6 % — ABNORMAL LOW (ref 39.0–52.0)
Hemoglobin: 12.2 g/dL — ABNORMAL LOW (ref 13.0–17.0)
MCH: 30.4 pg (ref 26.0–34.0)
MCHC: 32.4 g/dL (ref 30.0–36.0)
MCV: 93.8 fL (ref 80.0–100.0)
Platelets: 78 10*3/uL — ABNORMAL LOW (ref 150–400)
RBC: 4.01 MIL/uL — ABNORMAL LOW (ref 4.22–5.81)
RDW: 15.7 % — ABNORMAL HIGH (ref 11.5–15.5)
WBC: 8.2 10*3/uL (ref 4.0–10.5)
nRBC: 0 % (ref 0.0–0.2)

## 2019-07-06 LAB — RESPIRATORY PANEL BY RT PCR (FLU A&B, COVID)
Influenza A by PCR: NEGATIVE
Influenza B by PCR: NEGATIVE
SARS Coronavirus 2 by RT PCR: NEGATIVE

## 2019-07-06 LAB — TROPONIN I (HIGH SENSITIVITY)
Troponin I (High Sensitivity): <2 ng/L (ref <18)
Troponin I (High Sensitivity): <2 ng/L (ref <18)

## 2019-07-06 LAB — PHOSPHORUS: Phosphorus: 4.1 mg/dL (ref 2.5–4.6)

## 2019-07-06 LAB — TSH: TSH: 1.051 u[IU]/mL (ref 0.350–4.500)

## 2019-07-06 LAB — BRAIN NATRIURETIC PEPTIDE: B Natriuretic Peptide: 2666.4 pg/mL — ABNORMAL HIGH (ref 0.0–100.0)

## 2019-07-06 MED ORDER — PRAMIPEXOLE DIHYDROCHLORIDE 0.25 MG PO TABS
0.2500 mg | ORAL_TABLET | Freq: Two times a day (BID) | ORAL | Status: DC
  Administered 2019-07-06 – 2019-07-12 (×13): 0.25 mg via ORAL
  Filled 2019-07-06 (×14): qty 1

## 2019-07-06 MED ORDER — ERGOCALCIFEROL 1.25 MG (50000 UT) PO CAPS: 50000.0000 [IU] | ORAL_CAPSULE | ORAL | Status: DC

## 2019-07-06 MED ORDER — LEVALBUTEROL HCL 0.63 MG/3ML IN NEBU
0.6300 mg | INHALATION_SOLUTION | Freq: Once | RESPIRATORY_TRACT | Status: AC
  Administered 2019-07-06: 0.63 mg via RESPIRATORY_TRACT
  Filled 2019-07-06: qty 3

## 2019-07-06 MED ORDER — ACETAMINOPHEN 650 MG RE SUPP: 650.0000 mg | Freq: Four times a day (QID) | RECTAL | Status: DC | PRN

## 2019-07-06 MED ORDER — CHLORHEXIDINE GLUCONATE CLOTH 2 % EX PADS
6.0000 | MEDICATED_PAD | Freq: Every day | CUTANEOUS | Status: DC
  Administered 2019-07-06 – 2019-07-12 (×5): 6 via TOPICAL

## 2019-07-06 MED ORDER — DIFLUPREDNATE 0.05 % OP EMUL: 1.0000 [drp] | Freq: Two times a day (BID) | OPHTHALMIC | Status: DC

## 2019-07-06 MED ORDER — SIMVASTATIN 20 MG PO TABS
40.0000 mg | ORAL_TABLET | Freq: Every day | ORAL | Status: DC
  Administered 2019-07-06 – 2019-07-11 (×6): 40 mg via ORAL
  Filled 2019-07-06 (×6): qty 2

## 2019-07-06 MED ORDER — THIAMINE HCL 100 MG PO TABS
100.0000 mg | ORAL_TABLET | Freq: Every day | ORAL | Status: DC
  Administered 2019-07-06 – 2019-07-12 (×7): 100 mg via ORAL
  Filled 2019-07-06 (×7): qty 1

## 2019-07-06 MED ORDER — HYDROMORPHONE HCL 1 MG/ML IJ SOLN
0.5000 mg | Freq: Once | INTRAMUSCULAR | Status: AC
  Administered 2019-07-06: 0.5 mg via INTRAVENOUS
  Filled 2019-07-06: qty 1

## 2019-07-06 MED ORDER — LEVALBUTEROL HCL 0.63 MG/3ML IN NEBU
0.6300 mg | INHALATION_SOLUTION | Freq: Two times a day (BID) | RESPIRATORY_TRACT | Status: DC | PRN
  Administered 2019-07-06 – 2019-07-10 (×2): 0.63 mg via RESPIRATORY_TRACT
  Filled 2019-07-06: qty 3

## 2019-07-06 MED ORDER — FUROSEMIDE 10 MG/ML IJ SOLN
80.0000 mg | Freq: Once | INTRAMUSCULAR | Status: AC
  Administered 2019-07-06: 80 mg via INTRAVENOUS
  Filled 2019-07-06: qty 8

## 2019-07-06 MED ORDER — ONDANSETRON HCL 4 MG/2ML IJ SOLN: 4.0000 mg | Freq: Four times a day (QID) | INTRAMUSCULAR | Status: DC | PRN

## 2019-07-06 MED ORDER — FA-PYRIDOXINE-CYANOCOBALAMIN 2.5-25-2 MG PO TABS
1.0000 | ORAL_TABLET | Freq: Every day | ORAL | Status: DC
  Administered 2019-07-07 – 2019-07-12 (×6): 1 via ORAL
  Filled 2019-07-06 (×10): qty 1

## 2019-07-06 MED ORDER — FUROSEMIDE 10 MG/ML IJ SOLN
80.0000 mg | Freq: Two times a day (BID) | INTRAMUSCULAR | Status: DC
  Administered 2019-07-06 – 2019-07-07 (×2): 80 mg via INTRAVENOUS
  Filled 2019-07-06 (×2): qty 8

## 2019-07-06 MED ORDER — ALBUTEROL SULFATE (2.5 MG/3ML) 0.083% IN NEBU: 5.0000 mg | INHALATION_SOLUTION | Freq: Once | RESPIRATORY_TRACT | Status: DC

## 2019-07-06 MED ORDER — IPRATROPIUM-ALBUTEROL 0.5-2.5 (3) MG/3ML IN SOLN: 3.0000 mL | Freq: Once | RESPIRATORY_TRACT | Status: DC

## 2019-07-06 MED ORDER — CARVEDILOL 3.125 MG PO TABS
3.1250 mg | ORAL_TABLET | Freq: Two times a day (BID) | ORAL | Status: DC
  Administered 2019-07-06 – 2019-07-09 (×5): 3.125 mg via ORAL
  Filled 2019-07-06 (×7): qty 1

## 2019-07-06 MED ORDER — ORAL CARE MOUTH RINSE
15.0000 mL | Freq: Two times a day (BID) | OROMUCOSAL | Status: DC
  Administered 2019-07-07 – 2019-07-12 (×12): 15 mL via OROMUCOSAL

## 2019-07-06 MED ORDER — APIXABAN 2.5 MG PO TABS
2.5000 mg | ORAL_TABLET | Freq: Two times a day (BID) | ORAL | Status: DC
  Administered 2019-07-06 – 2019-07-12 (×13): 2.5 mg via ORAL
  Filled 2019-07-06 (×14): qty 1

## 2019-07-06 MED ORDER — ONDANSETRON HCL 4 MG PO TABS: 4.0000 mg | ORAL_TABLET | Freq: Four times a day (QID) | ORAL | Status: DC | PRN

## 2019-07-06 MED ORDER — VITAMIN D (ERGOCALCIFEROL) 1.25 MG (50000 UNIT) PO CAPS
50000.0000 [IU] | ORAL_CAPSULE | ORAL | Status: DC
  Administered 2019-07-08: 50000 [IU] via ORAL
  Filled 2019-07-06 (×2): qty 1

## 2019-07-06 MED ORDER — ACETAMINOPHEN 325 MG PO TABS
650.0000 mg | ORAL_TABLET | Freq: Four times a day (QID) | ORAL | Status: DC | PRN
  Administered 2019-07-07 – 2019-07-08 (×3): 650 mg via ORAL
  Filled 2019-07-06 (×3): qty 2

## 2019-07-06 NOTE — H&P (Signed)
Date: 07/06/2019               Patient Name:  Ricardo Payne MRN: [id number]  DOB: 08-17-1930 Age / Sex: 86 y.o., male   PCP: [name], FNP         Medical Service: Internal Medicine Teaching Service         Attending Physician: Dr. Evette Doffing, Mallie Mussel, *    First Contact: Dr. Benjamine Mola Pager: [phone]  Second Contact: Dr. Maricela Bo Pager: [phone]       After Hours (After 5p/  First Contact Pager: [phone]  weekends / holidays): Second Contact Pager: [phone]   Chief Complaint: [facility]  History of Present Illness: Ricardo Payne is a 86 y.o male with CAD s/p CABG x 1 in 1990, ischemic cardiomyopathy s/p ICD, PAF, HTN, HLD, CVA, and CKD stage III who presented to the ED with progressive SHOB. History was obtained via the patient and through chart review.   Patient states that over the last six weeks he is noticed progressive dyspnea on exertion. Prior to this he was able to at least ambulate around the house without significant shortness of breath. However over the last six weeks this is gotten progressively worse and he is now combined to his La-Z-Boy recliner. He is now short of breath at rest. Over the last six days he is not moved from his chair and had minimal PO intake. He has been taking his BMX 1 mg daily as prescribed. He feels that he is not using the restroom very often. In addition to his shortness of breath he is noticed increasing abdominal distention, lack of appetite, nausea with one episode of nonbloody emesis, bilateral leg pain, and worsening lower extremity edema. Today is shortness of breath and leg pain progressed to the point that he felt he needed to be evaluated. He otherwise denies headaches, visual changes, palpitations, chest pain, constipation/diarrhea, dysuria, hematuria, urinary hesitancy, incomplete voiding, or new rash. He did have one fever at 100.8 F yesterday. He has had no sick contact. He did experience a fall approximately two weeks ago that  resulted in significant facial trauma.  Patient recently moved from [place] to [place] in September 2020. He had a TTE in March 2020 that showed an LVEF of 25 to 30%. TTE in November 2020 illustrated significant improvement in his LVEF to 40 to 45%. He now follows with Dr. Gardiner Rhyme in the cardiology clinic. He was last seen on 1/13. At that time 1+ pitting edema of the bilateral lower extremities and his weight was at 220 pounds. This was up from his December visit when he weighed 204 pounds.  He currently lives alone and does not have great social support. He attempts to maintain his independence and do all his own ADLs. He does have difficulties cooking, cleaning, bathing, and dressing himself.  Meds:  No current facility-administered medications on file prior to encounter.   Current Outpatient Medications on File Prior to Encounter  Medication Sig Dispense Refill  . allopurinol (ZYLOPRIM) 100 MG tablet Take 100 mg by mouth daily.    Marland Kitchen apixaban (ELIQUIS) 2.5 MG TABS tablet Take 2.5 mg by mouth 2 (two) times daily.    . bumetanide (BUMEX) 1 MG tablet Take 1 tablet (1 mg total) by mouth daily. 90 tablet 1  . carvedilol (COREG) 6.25 MG tablet Take 3.125 mg by mouth 2 (two) times daily with a meal. 1/ 2  tablet (3.125mg ) twice daily    . DUREZOL  0.05 % EMUL Place 1 drop into the right eye 2 (two) times daily. Until Wed, then 1 drop daily    . ergocalciferol (VITAMIN D2) 1.25 MG (50000 UT) capsule Take 50,000 Units by mouth once a week. Sunday    . Folic Acid-Vit Q000111Q 123456 (FOLBEE) 2.5-25-1 MG TABS tablet Take 1 tablet by mouth daily.    . pramipexole (MIRAPEX) 0.25 MG tablet Take 0.25 mg by mouth 2 (two) times daily.    . simvastatin (ZOCOR) 40 MG tablet Take 40 mg by mouth daily at 6 PM.     Allergies: Allergies as of 07/06/2019  . (No Known Allergies)   Past Medical History:  Diagnosis Date  . Acquired thrombophilia ([place])   . Age-related cataract of both eyes   . ASHD  (arteriosclerotic heart disease)   . Benign prostatic hyperplasia   . CAD (coronary artery disease)    a. s/p CABG x1 in 1990  . Cerebellar stroke ([place]) 07/02/2016  . Chronic combined systolic and diastolic CHF (congestive heart failure) ([place])   . Chronic gout without tophus   . CKD (chronic kidney disease), stage IV ([place])   . Essential hypertension   . ICD (implantable cardioverter-defibrillator) in place   . Ischemic cardiomyopathy   . Mixed hyperlipidemia   . PAF (paroxysmal atrial fibrillation) ([place])   . Restless legs   . Secondary pulmonary arterial hypertension ([place])   . Thrombocytopenia ([place])   . Vitamin D deficiency    Family History  Problem Relation Age of Onset  . Hypertension Mother   . Suicidality Mother   . Valvular heart disease Father   . Heart disease Father   . Heart attack Brother    Social History: Moved from [place] in 2020. Previously worked as a Freight forwarder and subsequently retired. He is widowed since 2007. He has one step son that lives in [place]. Unfortunately he is not been able to do much over the last couple months due to significant shortness of breath. He denies the use of alcohol, tobacco products, or illicit substances.  Review of Systems: A complete ROS was negative except as per HPI.   Physical Exam: Blood pressure 117/61, pulse 89, temperature 99 F (37.2 C), temperature source Oral, resp. rate (!) 38, height 5\' 8"  (1.727 m), weight 101.5 kg, SpO2 94 %.  General: Elderly male in acute distress HENT: ecchymosis noted around orbits and across the nasal bridge. Laceration above the right brow with sutures in place. Pulm: Tachypneic with bibasilar crackles and some wheezing  CV: RRR, no murmurs, no rubs, JVD noted  Abdomen: Active bowel sounds, soft, non-distended, diffuse tenderness to palpation  Extremities: Pulses palpable in all extremities, pitting edema tracking to the hips  Skin: Warm and dry  Neuro: Alert and oriented x 3  EKG:  personally reviewed my interpretation is sinus rhythm with left axis deviation. PR prolongation. Left bundle branch block (QRS < 182msec). Negative sgarbossa criteria  CXR: personally reviewed my interpretation is slightly rotated but good penetration and inspiration. ICD noted. Status post sternotomy. No right pleural effusion. Unable to assess left side. Increased pulmonary vascular congestion.  Assessment & Plan by Problem: Active Problems:   Acute respiratory failure with hypoxia (HCC)  Ricardo Payne is a 86 y.o male with CAD s/p CABG x 1 in 1990, ischemic cardiomyopathy s/p ICD, PAF, HTN, HLD, CVA, and CKD stage III who presented to the ED with signs and symptoms of an acute on chronic heart failure exacerbation. He was found to  have acute hypoxic respiratory failure with acute on chronic kidney injury. He was subsequently admitted for further evaluation/management.  Acute Hypoxic Respiratory Failure Acute on Chronic HFrEF, ischemic  - Patient is significantly volume overload on physical exam currently requiring 2 L per minute nasal cannula. He does have significant tachypnea. Will need to monitor respiratory status closely. Otherwise he has a good pulse pressure and feels warm on physical exam.  - Trial IV furosemide 80 mg once. - Not on ACE/ARB/ARNI or aldosterone antagonist due to CKD and hyperkalemia  - ICD in place. Borderline QRS to consider CRT - Continue Carvedilol 3.125 mg BID  - Check magnesium  - No need to repeat TTE  - Monitor I&O's  - Daily weights   AKI on CKD Stage III - Baseline creatinine around 2.0, elevated to 3.82 today  - Suspect this is hemodynamically mediated  - Should improve with diureses  - Continue to monitor   PAF - Currently in sinus rhythm  - Continue apixaban 2.5 mg BID   Gout - Hold allopurinol 100 mg QD with AKI   HTN CAD s/p CABG x 1  - Continue Carvedilol 3.125 mg BID - Continue Simvastatin 40 mg QD  Diet: Heart healthy  VTE ppx:  Apixaban  CODE STATUS: Full Code  Dispo: Admit patient to Inpatient with expected length of stay greater than 2 midnights.  SignedIna Homes, MD 07/06/2019, 8:32 AM  Pager: [phone]

## 2019-07-06 NOTE — Progress Notes (Signed)
Presented to the bedside to re-evaluate the patient. His is doing much better from a respiratory standpoint. He has urinated ~1L with the IV furosemide 80 mg. We will continue current management but increase to 80 mg BID.   Ina Homes, MD

## 2019-07-06 NOTE — Progress Notes (Signed)
Lower extremity venous has been completed.   Preliminary results in CV Proc.   Abram Sander 07/06/2019 9:53 AM

## 2019-07-06 NOTE — ED Triage Notes (Signed)
Pt BIB [facility] from home c/o worsen SOB, lethargic, decreased appetite, and Lower leg edema over past 6 days. HX CHF. Pt takes BUMEX 1 mg daily.

## 2019-07-06 NOTE — Progress Notes (Signed)
Ricardo Payne 86 year old male admitted with acute hypoxic respiratory failure secondary acute on chronic HFrEF.  Page to evaluated patient for increased work of breathing.  Patient alert on exam. Says he is feels he is improving since admission. He describes a progressive dyspnea before admission.  RN reports patient has no output recorded since admitted to 2w. One episode of urinary incontinence with moving patient to chair.   Physical exam   Gen: Elderly male with ecchymosis around eyes , laceration above right brow. NAD Pulmonary: Tachypnea with bibasilar crackles and some wheezing throughout lung fields CV: RRR, no murmurs, JVD noted Ext: pitting edema up to thighs  ABG: 7.493/25.9/91.5 on 3L HFNC Bicarb on ABG 19.7  Assessment:   Acute hypoxic respiratory failure secondary to acute on chronic HFrEF Chronic respiratory alkalosis with secondary metabolic  ABG findings consistent with tachypnea from heart failure and lasix. P: -Bladder scan to see if patient is retaining  - place foley, RN reports unable to keep condom cath on.  -BMP, mag, Phos - IV lasix 80 ( 3rd dose today)

## 2019-07-06 NOTE — ED Notes (Signed)
Lunch Tray Ordered @ 1032. 

## 2019-07-06 NOTE — ED Provider Notes (Addendum)
[facility] Emergency Department Provider Note MRN:  [id number]  Arrival date & time: 07/06/19     Chief Complaint   Shortness of Breath   History of Present Illness   Ricardo Payne is a 86 y.o. year-old male with a history of CAD, CHF, CKD presenting to the ED with chief complaint of shortness of breath.  6 days of progressively worsening shortness of breath and lower extremity edema.  Explains that he cannot sleep because he is short of breath.  Has been unable to eat or drink much for the past 6 days.  Endorsing cough, no fever.  No sick contacts.  No chest pain, no abdominal pain.  Has been taking his medications but they are not helping.  Review of Systems  A complete 10 system review of systems was obtained and all systems are negative except as noted in the HPI and PMH.   Patient's Health History    Past Medical History:  Diagnosis Date  . Acquired thrombophilia ([place])   . Age-related cataract of both eyes   . ASHD (arteriosclerotic heart disease)   . Benign prostatic hyperplasia   . CAD (coronary artery disease)    a. s/p CABG x1 in 1990  . Cerebellar stroke ([place]) 07/02/2016  . Chronic combined systolic and diastolic CHF (congestive heart failure) ([place])   . Chronic gout without tophus   . CKD (chronic kidney disease), stage IV ([place])   . Essential hypertension   . ICD (implantable cardioverter-defibrillator) in place   . Ischemic cardiomyopathy   . Mixed hyperlipidemia   . PAF (paroxysmal atrial fibrillation) ([place])   . Restless legs   . Secondary pulmonary arterial hypertension ([place])   . Thrombocytopenia ([place])   . Vitamin D deficiency     Past Surgical History:  Procedure Laterality Date  . CHOLECYSTECTOMY    . CORONARY ARTERY BYPASS GRAFT    . PACEMAKER IMPLANT  10/2018    Family History  Problem Relation Age of Onset  . Hypertension Mother   . Suicidality Mother   . Valvular heart disease Father   . Heart disease Father   .  Heart attack Brother     Social History   Socioeconomic History  . Marital status: Widowed    Spouse name: Not on file  . Number of children: 2  . Years of education: Not on file  . Highest education level: Not on file  Occupational History  . Not on file  Tobacco Use  . Smoking status: Former Smoker    Quit date: 1988    Years since quitting: 33.1  . Smokeless tobacco: Never Used  Substance and Sexual Activity  . Alcohol use: Yes    Comment: rare  . Drug use: Never  . Sexual activity: Not on file  Other Topics Concern  . Not on file  Social History Narrative  . Not on file   Social Determinants of Health   Financial Resource Strain:   . Difficulty of Paying Living Expenses: Not on file  Food Insecurity:   . Worried About Charity fundraiser in the Last Year: Not on file  . Ran Out of Food in the Last Year: Not on file  Transportation Needs:   . Lack of Transportation (Medical): Not on file  . Lack of Transportation (Non-Medical): Not on file  Physical Activity:   . Days of Exercise per Week: Not on file  . Minutes of Exercise per Session: Not on file  Stress:   .  Feeling of Stress : Not on file  Social Connections:   . Frequency of Communication with Friends and Family: Not on file  . Frequency of Social Gatherings with Friends and Family: Not on file  . Attends Religious Services: Not on file  . Active Member of Clubs or Organizations: Not on file  . Attends Archivist Meetings: Not on file  . Marital Status: Not on file  Intimate Partner Violence:   . Fear of Current or Ex-Partner: Not on file  . Emotionally Abused: Not on file  . Physically Abused: Not on file  . Sexually Abused: Not on file     Physical Exam   Vitals:   07/06/19 0645 07/06/19 0700  BP: 131/69 133/67  Pulse: 92 92  Resp: (!) 45 (!) 36  Temp:    SpO2: 95% 95%    CONSTITUTIONAL: Chronically ill-appearing, NAD NEURO:  Alert and oriented x 3, no focal deficits EYES:  eyes  equal and reactive ENT/NECK:  no LAD, no JVD CARDIO: Regular rate, well-perfused, normal S1 and S2 PULM:  CTAB no wheezing or rhonchi GI/GU:  normal bowel sounds, non-distended, non-tender MSK/SPINE:  No gross deformities, 2+ pitting edema to the bilateral lower extremities SKIN:  no rash, atraumatic PSYCH:  Appropriate speech and behavior  *Additional and/or pertinent findings included in MDM below  Diagnostic and Interventional Summary    EKG Interpretation  Date/Time:  Friday July 06 2019 06:07:28 EST Ventricular Rate:  97 PR Interval:    QRS Duration: 149 QT Interval:  373 QTC Calculation: 474 R Axis:   -54 Text Interpretation: Sinus rhythm Left bundle branch block No significant change was found Confirmed by Gerlene Fee [phone]) on 07/06/2019 6:23:42 AM      Cardiac Monitoring Interpretation:  Labs Reviewed  RESPIRATORY PANEL BY RT PCR (FLU A&B, COVID)  CBC  BASIC METABOLIC PANEL  BRAIN NATRIURETIC PEPTIDE  TROPONIN I (HIGH SENSITIVITY)    DG Chest Portable 1 View  Final Result    CT ANGIO CHEST PE W OR WO CONTRAST    (Results Pending)  VAS [place] LOWER EXTREMITY VENOUS (DVT) (ONLY [facility] & [facility])    (Results Pending)    Medications - No data to display   Procedures  /  Critical Care Procedures  ED Course and Medical Decision Making  I have reviewed the triage vital signs, the nursing notes, and pertinent available records from the EMR.  Pertinent labs & imaging results that were available during my care of the patient were reviewed by me and considered in my medical decision making (see below for details).     Suspect CHF exacerbation as the cause of patient's progressively worsening shortness of breath and lower extremity edema.  Hemodynamically stable here without fever, mildly tachypneic, new oxygen requirement, 2 L nasal cannula.  Anticipating need for admission.  No chest pain, EKG reassuring.  Also considering pneumonia, COVID-19, less likely PE.  7 AM  update: Chest x-ray surprisingly clear, raising the concern for pulmonary embolism.  No wheezing, no history of COPD.  Patient has AKI and will be unable to undergo CTA imaging.  Will admit to hospitalist service for further care.  Barth Kirks. Sedonia Small, [place] [email or web address]  Final Clinical Impressions(s) / ED Diagnoses     ICD-10-CM   1. Hypoxia  R09.02   2. Shortness of breath  R06.02   3. Edema, unspecified type  R60.9     ED Discharge Orders  None       Discharge Instructions Discussed with and Provided to Patient:   Discharge Instructions   None       Maudie Flakes, MD 07/06/19 [id number]    Maudie Flakes, MD 07/06/19 [phone]

## 2019-07-07 ENCOUNTER — Inpatient Hospital Stay (HOSPITAL_COMMUNITY): Payer: Medicare Other

## 2019-07-07 DIAGNOSIS — K7460 Unspecified cirrhosis of liver: Secondary | ICD-10-CM

## 2019-07-07 DIAGNOSIS — I959 Hypotension, unspecified: Secondary | ICD-10-CM

## 2019-07-07 DIAGNOSIS — R509 Fever, unspecified: Secondary | ICD-10-CM

## 2019-07-07 LAB — URINALYSIS, ROUTINE W REFLEX MICROSCOPIC
Bilirubin Urine: NEGATIVE
Glucose, UA: NEGATIVE mg/dL
Ketones, ur: NEGATIVE mg/dL
Leukocytes,Ua: NEGATIVE
Nitrite: NEGATIVE
Protein, ur: 30 mg/dL — AB
Specific Gravity, Urine: 1.01 (ref 1.005–1.030)
pH: 5 (ref 5.0–8.0)

## 2019-07-07 LAB — BLOOD GAS, ARTERIAL
Acid-base deficit: 0.7 mmol/L (ref 0.0–2.0)
Bicarbonate: 22.2 mmol/L (ref 20.0–28.0)
Drawn by: 24610
FIO2: 36
O2 Saturation: 97.2 %
Patient temperature: 39.5
pCO2 arterial: 29.4 mmHg — ABNORMAL LOW (ref 32.0–48.0)
pH, Arterial: 7.49 — ABNORMAL HIGH (ref 7.350–7.450)
pO2, Arterial: 86.4 mmHg (ref 83.0–108.0)

## 2019-07-07 LAB — BASIC METABOLIC PANEL
Anion gap: 15 (ref 5–15)
BUN: 91 mg/dL — ABNORMAL HIGH (ref 8–23)
CO2: 21 mmol/L — ABNORMAL LOW (ref 22–32)
Calcium: 8.7 mg/dL — ABNORMAL LOW (ref 8.9–10.3)
Chloride: 100 mmol/L (ref 98–111)
Creatinine, Ser: 4.11 mg/dL — ABNORMAL HIGH (ref 0.61–1.24)
GFR calc Af Amer: 14 mL/min — ABNORMAL LOW (ref >60)
GFR calc non Af Amer: 12 mL/min — ABNORMAL LOW (ref >60)
Glucose, Bld: 117 mg/dL — ABNORMAL HIGH (ref 70–99)
Potassium: 4.2 mmol/L (ref 3.5–5.1)
Sodium: 136 mmol/L (ref 135–145)

## 2019-07-07 LAB — PROTIME-INR
INR: 2.7 — ABNORMAL HIGH (ref 0.8–1.2)
Prothrombin Time: 28.2 seconds — ABNORMAL HIGH (ref 11.4–15.2)

## 2019-07-07 LAB — MRSA PCR SCREENING: MRSA by PCR: NEGATIVE

## 2019-07-07 LAB — COMPREHENSIVE METABOLIC PANEL
ALT: 61 U/L — ABNORMAL HIGH (ref 0–44)
AST: 113 U/L — ABNORMAL HIGH (ref 15–41)
Albumin: 3.2 g/dL — ABNORMAL LOW (ref 3.5–5.0)
Alkaline Phosphatase: 95 U/L (ref 38–126)
Anion gap: 12 (ref 5–15)
BUN: 104 mg/dL — ABNORMAL HIGH (ref 8–23)
CO2: 22 mmol/L (ref 22–32)
Calcium: 8.2 mg/dL — ABNORMAL LOW (ref 8.9–10.3)
Chloride: 101 mmol/L (ref 98–111)
Creatinine, Ser: 4.69 mg/dL — ABNORMAL HIGH (ref 0.61–1.24)
GFR calc Af Amer: 12 mL/min — ABNORMAL LOW (ref >60)
GFR calc non Af Amer: 10 mL/min — ABNORMAL LOW (ref >60)
Glucose, Bld: 120 mg/dL — ABNORMAL HIGH (ref 70–99)
Potassium: 3.6 mmol/L (ref 3.5–5.1)
Sodium: 135 mmol/L (ref 135–145)
Total Bilirubin: 4.6 mg/dL — ABNORMAL HIGH (ref 0.3–1.2)
Total Protein: 5.9 g/dL — ABNORMAL LOW (ref 6.5–8.1)

## 2019-07-07 LAB — LACTIC ACID, PLASMA: Lactic Acid, Venous: 1.6 mmol/L (ref 0.5–1.9)

## 2019-07-07 LAB — HEPATITIS A ANTIBODY, IGM: Hep A IgM: NONREACTIVE

## 2019-07-07 LAB — MAGNESIUM: Magnesium: 2.3 mg/dL (ref 1.7–2.4)

## 2019-07-07 LAB — HEPATITIS B SURFACE ANTIGEN: Hepatitis B Surface Ag: NONREACTIVE

## 2019-07-07 LAB — HEPATITIS C ANTIBODY: HCV Ab: NONREACTIVE

## 2019-07-07 LAB — HEPATITIS B CORE ANTIBODY, TOTAL: Hep B Core Total Ab: NONREACTIVE

## 2019-07-07 MED ORDER — SODIUM CHLORIDE 0.9 % IV SOLN
2.0000 g | INTRAVENOUS | Status: DC
  Administered 2019-07-07: 2 g via INTRAVENOUS
  Filled 2019-07-07 (×2): qty 2

## 2019-07-07 MED ORDER — HYDROMORPHONE HCL 1 MG/ML IJ SOLN: 0.5000 mg | Freq: Once | INTRAMUSCULAR | Status: DC

## 2019-07-07 MED ORDER — VANCOMYCIN VARIABLE DOSE PER UNSTABLE RENAL FUNCTION (PHARMACIST DOSING): Status: DC

## 2019-07-07 MED ORDER — FUROSEMIDE 10 MG/ML IJ SOLN: 40.0000 mg | Freq: Two times a day (BID) | INTRAMUSCULAR | Status: DC

## 2019-07-07 MED ORDER — VANCOMYCIN HCL 2000 MG/400ML IV SOLN
2000.0000 mg | Freq: Once | INTRAVENOUS | Status: AC
  Administered 2019-07-07: 2000 mg via INTRAVENOUS
  Filled 2019-07-07: qty 400

## 2019-07-07 NOTE — Progress Notes (Signed)
Placed pt on CPAP with M nasal mask. BI-LEVEL settings max 20 and min 4 with 6L bled in. Pt tolerating well RT will cont to monitor.

## 2019-07-07 NOTE — Progress Notes (Addendum)
Subjective:  Patient seen at bedside. Patient states his breathing is a little bit better than yesterday. Patient states that he fell about two weeks ago and his breathing has been particularly bad over the last 6 days.  Objective:   Vital Signs (last 24 hours): Vitals:   07/07/19 0400 07/07/19 0500 07/07/19 0600 07/07/19 0632  BP: (!) 108/47 (!) 95/49 (!) 109/35   Pulse: 92 91 92   Resp: (!) 35 (!) 28 (!) 39   Temp:      TempSrc:      SpO2: 92% 90% 90%   Weight:    101 kg  Height:       Physical Exam: General Alert and answers questions appropriately  Cardiac Regular rate and rhythm, no murmurs, rubs, or gallops. No JVD  Pulmonary Desaturated to 89% on room air with use of accessory muscles, saturates well without distress while on 4 L HFNC. Lungs are clear to auscultation  Extremities 1+ pitting edema up to knees    CBC Latest Ref Rng & Units 07/06/2019 06/25/2019 06/11/2019  WBC 4.0 - 10.5 K/uL 8.2 6.4 6.2  Hemoglobin 13.0 - 17.0 g/dL 12.2(L) 11.2(L) 11.3(L)  Hematocrit 39.0 - 52.0 % 37.6(L) 35.0(L) 34.8(L)  Platelets 150 - 400 K/uL 78(L) 71(L) 71(L)   BMP Latest Ref Rng & Units 07/07/2019 07/06/2019 07/06/2019  Glucose 70 - 99 mg/dL 117(H) 137(H) 158(H)  BUN 8 - 23 mg/dL 91(H) 83(H) 70(H)  Creatinine 0.61 - 1.24 mg/dL 4.11(H) 4.12(H) 3.82(H)  BUN/Creat Ratio 10 - 24 - - -  Sodium 135 - 145 mmol/L 136 135 136  Potassium 3.5 - 5.1 mmol/L 4.2 4.7 4.9  Chloride 98 - 111 mmol/L 100 98 101  CO2 22 - 32 mmol/L 21(L) 20(L) 20(L)  Calcium 8.9 - 10.3 mg/dL 8.7(L) 8.9 9.2   Mag: 2.3   Assessment/Plan:   Principal Problem:   Acute on chronic HFrEF (heart failure with reduced ejection fraction) (HCC) Active Problems:   Acute kidney injury superimposed on chronic kidney disease (HCC)  Patient is an 86 year old male with past medical history significant for coronary artery disease status post CABG x1 in 1990, ischemic cardiomyopathy status post ICD, paroxysmal atrial fibrillation,  hypertension, hyperlipidemia, CVA, and CKD stage III who presented to the ER on 07/06/2019 with signs and symptoms of acute on chronic heart failure exacerbation.  He was found to have acute hypoxic respiratory failure with acute on chronic kidney injury.  He was subsequently admitted for further evaluation/management.  # Acute hypoxic respiratory failure # Acute on chronic HFrEF, ischemic # CAD Patient initially with severe volume overload with 2+ pitting edema up to mid-thighs. Patient now with 1+ up to knees and normal JVP. Patient remains tachypneic and has respiratory distress off of oxygen, despite improvement in volume status. TTE from November 2020 shows EF 40-45% and borderline. TTE from 07/2018 showed EF 25-30%. CT chest high resolution was ordered overnight in consideration of ILD. PE less likely given patient is on Eliquis.  * Lasix IV 80 mg given x3 yesterday (0853, 1515, 1833) and 80 mg IV x1 given this AM. Will decrease to 40 mg IV twice daily * Patient with 1700 ml urine output recorded since yesterday at 7am, weight of 101 kg this AM, 101.5 kg yesterday * Monitor I/Os * Daily weights * Continue home carvedilol 3.125 mg BID + simvastatin 40 mg daily * Spironolactone was tried as outpatient and stopped due to hyperkalemia * ACE/ARB/Entresto is not indicated due to CKD *  ICD in place - likely placed due to prior EF < 30%  # AKI on CKD Stage III Baseline creatinine around 2.0, elevated to 3.82 on admission, 4.11 today. This is likely secondary to cardiorenal syndrome/venous congestion, will continue monitor with diuresis  # PAF * Remains in sinus rhythm * Continue telemetry * Continue Eliquis 2.5 mg BID  Diet: Heart healthy DVT Ppx: Eliquis 2.5 mg twice daily Dispo: Anticipated discharge in approximately 2-3 days  [name], MD 07/07/2019, 6:56 AM Pager: [phone]

## 2019-07-07 NOTE — Progress Notes (Signed)
  Date: 07/07/2019  Patient name: Ricardo Payne  Medical record number: [id number]  Date of birth: [birth year]        I have seen and evaluated this patient and I have discussed the plan of care with the house staff. Please see Dr. Darci Current note for complete details. I concur with his findings and plan.  Agree with infectious work up.  BP was low, so will discontinue diuresis at this time.  Further work up, follow up based on initial findings.   Sid [place], MD 07/07/2019, 7:57 PM

## 2019-07-07 NOTE — Progress Notes (Signed)
Pharmacy Antibiotic Note  Ricardo Payne is a 86 y.o. male admitted on 07/06/2019 with sepsis.  Pharmacy has been consulted for vancomycin and cefepime dosing.  Temperature today increasing to 103. WBC 8.2. Scr 4.11 (AKI on CKD - baseline ~2).   Plan: Vancomycin 2g IV once then will plan on dose by level given SCr  Cefepime 2 g IV every 24 hours Monitor renal fx, cx results, clinical pic, opportunities to de-escalate, and vanc levels as indicated  Height: 5\' 8"  (172.7 cm) Weight: 222 lb 10.6 oz (101 kg) IBW/kg (Calculated) : 68.4  Temp (24hrs), Avg:97.7 F (36.5 C), Min:97.6 F (36.4 C), Max:97.8 F (36.6 C)  Recent Labs  Lab 07/06/19 0628 07/06/19 1753 07/07/19 0220  WBC 8.2  --   --   CREATININE 3.82* 4.12* 4.11*    Estimated Creatinine Clearance: 14.9 mL/min (A) (by C-G formula based on SCr of 4.11 mg/dL (H)).    No Known Allergies  Antimicrobials this admission: Vancomycin 2/6 >>  Cefepime 2/6 >>   Dose adjustments this admission: N/A  Microbiology results: 2/6 BCx: ordered 2/6 MRSA PCR: ordered  Thank you for allowing pharmacy to be a part of this patient's care.  Antonietta Jewel, PharmD, BCCCP Clinical Pharmacist  Phone: [phone]  Please check AMION for all [place] phone numbers After 10:00 PM, call [place] [phone] 07/07/2019 11:34 AM

## 2019-07-07 NOTE — Progress Notes (Addendum)
Event: Paged by RN at 1122 about elevated temperature to 103 F at 0831. Tylenol was given at that time.  Subjective: Patient seen and assessed at bedside. Patient denies fever, chills, urinary symptoms, cough.  Vitals: BP: 79/53, manual of 84/50 HR: 95 RR: 26  Assessment/Plan: Patient hypotensive and now with fever. Will discontinue diuresis and conduct broad infectious workup.  * Will get blood cultures x2, urinalysis, lactic acid * Start broad-spectrum antibiotic coverage with vancomycin+cefepime after blood cultures are collected * Discontinue foley catheter * Please page with any questions or concerns  [name], MD 07/07/2019, 12:04 PM Pager: [phone]

## 2019-07-08 LAB — COMPREHENSIVE METABOLIC PANEL
ALT: 85 U/L — ABNORMAL HIGH (ref 0–44)
AST: 160 U/L — ABNORMAL HIGH (ref 15–41)
Albumin: 3.1 g/dL — ABNORMAL LOW (ref 3.5–5.0)
Alkaline Phosphatase: 94 U/L (ref 38–126)
Anion gap: 16 — ABNORMAL HIGH (ref 5–15)
BUN: 115 mg/dL — ABNORMAL HIGH (ref 8–23)
CO2: 23 mmol/L (ref 22–32)
Calcium: 8.3 mg/dL — ABNORMAL LOW (ref 8.9–10.3)
Chloride: 97 mmol/L — ABNORMAL LOW (ref 98–111)
Creatinine, Ser: 4.52 mg/dL — ABNORMAL HIGH (ref 0.61–1.24)
GFR calc Af Amer: 13 mL/min — ABNORMAL LOW (ref >60)
GFR calc non Af Amer: 11 mL/min — ABNORMAL LOW (ref >60)
Glucose, Bld: 117 mg/dL — ABNORMAL HIGH (ref 70–99)
Potassium: 4.7 mmol/L (ref 3.5–5.1)
Sodium: 136 mmol/L (ref 135–145)
Total Bilirubin: 4.6 mg/dL — ABNORMAL HIGH (ref 0.3–1.2)
Total Protein: 6.2 g/dL — ABNORMAL LOW (ref 6.5–8.1)

## 2019-07-08 LAB — CBC
HCT: 35.8 % — ABNORMAL LOW (ref 39.0–52.0)
Hemoglobin: 11.8 g/dL — ABNORMAL LOW (ref 13.0–17.0)
MCH: 30.4 pg (ref 26.0–34.0)
MCHC: 33 g/dL (ref 30.0–36.0)
MCV: 92.3 fL (ref 80.0–100.0)
Platelets: 52 10*3/uL — ABNORMAL LOW (ref 150–400)
RBC: 3.88 MIL/uL — ABNORMAL LOW (ref 4.22–5.81)
RDW: 16.1 % — ABNORMAL HIGH (ref 11.5–15.5)
WBC: 8.7 10*3/uL (ref 4.0–10.5)
nRBC: 0 % (ref 0.0–0.2)

## 2019-07-08 LAB — BASIC METABOLIC PANEL
Anion gap: 14 (ref 5–15)
BUN: 118 mg/dL — ABNORMAL HIGH (ref 8–23)
CO2: 23 mmol/L (ref 22–32)
Calcium: 8.3 mg/dL — ABNORMAL LOW (ref 8.9–10.3)
Chloride: 99 mmol/L (ref 98–111)
Creatinine, Ser: 4.42 mg/dL — ABNORMAL HIGH (ref 0.61–1.24)
GFR calc Af Amer: 13 mL/min — ABNORMAL LOW (ref >60)
GFR calc non Af Amer: 11 mL/min — ABNORMAL LOW (ref >60)
Glucose, Bld: 162 mg/dL — ABNORMAL HIGH (ref 70–99)
Potassium: 4 mmol/L (ref 3.5–5.1)
Sodium: 136 mmol/L (ref 135–145)

## 2019-07-08 LAB — CREATININE, URINE, RANDOM: Creatinine, Urine: 88.96 mg/dL

## 2019-07-08 LAB — SODIUM, URINE, RANDOM: Sodium, Ur: 23 mmol/L

## 2019-07-08 LAB — OSMOLALITY, URINE: Osmolality, Ur: 421 mOsm/kg (ref 300–900)

## 2019-07-08 MED ORDER — LACTATED RINGERS IV SOLN: INTRAVENOUS | Status: AC

## 2019-07-08 NOTE — Progress Notes (Signed)
Pt ref CPAP for the night

## 2019-07-08 NOTE — Progress Notes (Signed)
  Date: 07/08/2019  Patient name: Ricardo Payne  Medical record number: [id number]  Date of birth: 04-Oct-1930        I have seen and evaluated this patient and I have discussed the plan of care with the house staff. Please see Dr. Darci Current note for complete details. I concur with his findings and plan.  Patient with worsening renal function with diuresis.  Breathing and edema is improved.  Possibly with cardiorenal syndrome.  Agree with attempting to provide a bit of fluid back today and to recheck.  He will need Nephrology follow up outpatient.  The fever noted is likely an outlier.  But we will continue to monitor for recurrence.   Sid [place], MD 07/08/2019, 3:29 PM

## 2019-07-08 NOTE — Progress Notes (Signed)
Spoke with son and update him on t day from nursing standpoint. Request update call from MD.

## 2019-07-08 NOTE — Progress Notes (Signed)
Subjective:  Patient seen at bedside. Patient states he feels much better but still feels a little fatigued. Patient states that his PCP told him he had kidney issues and referred him to a nephrologist. Patient is not aware of why he has kidney problems.  Objective:    Vital Signs (last 24 hours): Vitals:   07/08/19 0300 07/08/19 0800 07/08/19 0807 07/08/19 1000  BP: (!) 95/44 (!) 111/55  (!) 102/49  Pulse: 76 84  78  Resp: 17 (!) 24  (!) 26  Temp: 98.8 F (37.1 C)  98.4 F (36.9 C)   TempSrc: Oral  Oral   SpO2: 100% 100%  99%  Weight:      Height:        Physical Exam: General Alert and answers questions appropriately, no acute distress  Cardiac Regular rate and rhythm, no murmurs, rubs, or gallops  Pulmonary Clear to auscultation bilaterally without wheezes, rhonchi, or rales  Extremities No peripheral edema   CMP Latest Ref Rng & Units 07/08/2019 07/07/2019 07/07/2019  Glucose 70 - 99 mg/dL 117(H) 120(H) 117(H)  BUN 8 - 23 mg/dL 115(H) 104(H) 91(H)  Creatinine 0.61 - 1.24 mg/dL 4.52(H) 4.69(H) 4.11(H)  Sodium 135 - 145 mmol/L 136 135 136  Potassium 3.5 - 5.1 mmol/L 4.7 3.6 4.2  Chloride 98 - 111 mmol/L 97(L) 101 100  CO2 22 - 32 mmol/L 23 22 21(L)  Calcium 8.9 - 10.3 mg/dL 8.3(L) 8.2(L) 8.7(L)  Total Protein 6.5 - 8.1 g/dL 6.2(L) 5.9(L) -  Total Bilirubin 0.3 - 1.2 mg/dL 4.6(H) 4.6(H) -  Alkaline Phos 38 - 126 U/L 94 95 -  AST 15 - 41 U/L 160(H) 113(H) -  ALT 0 - 44 U/L 85(H) 61(H) -   CBC Latest Ref Rng & Units 07/08/2019 07/06/2019 06/25/2019  WBC 4.0 - 10.5 K/uL 8.7 8.2 6.4  Hemoglobin 13.0 - 17.0 g/dL 11.8(L) 12.2(L) 11.2(L)  Hematocrit 39.0 - 52.0 % 35.8(L) 37.6(L) 35.0(L)  Platelets 150 - 400 K/uL 52(L) 78(L) 71(L)   Urinalysis    Component Value Date/Time   COLORURINE YELLOW 07/07/2019 1257   APPEARANCEUR HAZY (A) 07/07/2019 1257   LABSPEC 1.010 07/07/2019 1257   PHURINE 5.0 07/07/2019 1257   GLUCOSEU NEGATIVE 07/07/2019 1257   HGBUR LARGE (A) 07/07/2019 1257     BILIRUBINUR NEGATIVE 07/07/2019 1257   KETONESUR NEGATIVE 07/07/2019 1257   PROTEINUR 30 (A) 07/07/2019 1257   NITRITE NEGATIVE 07/07/2019 1257   LEUKOCYTESUR NEGATIVE 07/07/2019 1257   Lactic Acid, Venous    Component Value Date/Time   LATICACIDVEN 1.6 07/07/2019 1438     Assessment/Plan:   Principal Problem:   Acute on chronic HFrEF (heart failure with reduced ejection fraction) (HCC) Active Problems:   Acute kidney injury superimposed on chronic kidney disease (HCC)   Cirrhosis ([place])   Patient is an 86 year old male with past medical history significant for coronary artery disease status post CABG x1 in 1990, ischemic cardiomyopathy status post ICD, paroxysmal atrial fibrillation, hypertension, hyperlipidemia, CVA and CKD stage III who presented to the ER on 07/06/2019 with signs and symptoms of acute on chronic heart failure exacerbation.  Following diuresis patient has had significant improvement in respiratory symptoms and lower extremity edema but now has worsening renal function.  # Acute hypoxic respiratory failure: # Acute on chronic HFrEF, ischemic Patient was initially with severe volume overload with 2+ pitting edema up to mid thighs.  Patient was diuresed aggressively and returned to euvolemic status.  Patient was febrile to 103 yesterday.  Broad infectious work-up was nonrevealing.   Blood cultures x2 have no growth at < 24 hours, urinalysis was unremarkable, lactic acid was within normal limits.  Given absence of source or clear infectious symptoms, the temperature may have been a spurious reading. Will discontinue antibiotics. High-resolution chest CT was without evidence for ILD, did demonstrate cirrhosis and ascites.  However, complete abdominal ultrasound revealed normal hepatic echogenicity and borderline splenic size (in setting of ITP).  Patient has mildly elevated AST/ALT of 113/61, hepatitis a IgM, hepatitis B core antibody, hepatitis B surface antigen and  hepatitis C antibody were all negative.  Hepatitis B surface antibody pending.  INR is significantly elevated at 2.7 and prothrombin time of 20.2, patient is on chronic Eliquis. * Net 1250 ml urine output recorded since yesterday at 7am * Monitor I/Os, daily weights *Continue home carvedilol 3.125 mg (hold for SBP < 100) twice daily + simvastatin 40 mg daily   # AKI on CKD Stage III: Baseline creatinine around 2.0, elevated 3.82 on admission, up to 4.7 following diuresis, and now 4.5 this AM. FeNa of 0.9% indicating likely prerenal etiology. * Will start mIVF LR for 75 cc/hr for 5 hours and check BMP at 7 pm  # Thrombocytopenia: Per hematology note, patient with ITP. Platelets of 52 this AM. Will continue to monitor, consider transfusing platelets if count < 30,000 or if patient has significant bleeding  # Kidney lesion: 2.5 cm exophytic solid lesion in upper pole of right kidney. Recommend outpatient MRI should be considered for further workup  Diet: Heart DVT Ppx: Eliquis 2.5 mg twice daily Dispo: Anticipated discharge pending further medical workup  [name], MD 07/08/2019, 11:58 AM Pager: [phone]

## 2019-07-09 ENCOUNTER — Inpatient Hospital Stay (HOSPITAL_COMMUNITY): Payer: Medicare Other

## 2019-07-09 ENCOUNTER — Other Ambulatory Visit (HOSPITAL_COMMUNITY): Payer: 59

## 2019-07-09 DIAGNOSIS — N289 Disorder of kidney and ureter, unspecified: Secondary | ICD-10-CM

## 2019-07-09 DIAGNOSIS — D696 Thrombocytopenia, unspecified: Secondary | ICD-10-CM

## 2019-07-09 DIAGNOSIS — I5023 Acute on chronic systolic (congestive) heart failure: Secondary | ICD-10-CM

## 2019-07-09 LAB — CBC
HCT: 29.4 % — ABNORMAL LOW (ref 39.0–52.0)
Hemoglobin: 9.6 g/dL — ABNORMAL LOW (ref 13.0–17.0)
MCH: 30 pg (ref 26.0–34.0)
MCHC: 32.7 g/dL (ref 30.0–36.0)
MCV: 91.9 fL (ref 80.0–100.0)
Platelets: 42 10*3/uL — ABNORMAL LOW (ref 150–400)
RBC: 3.2 MIL/uL — ABNORMAL LOW (ref 4.22–5.81)
RDW: 15.8 % — ABNORMAL HIGH (ref 11.5–15.5)
WBC: 5.5 10*3/uL (ref 4.0–10.5)
nRBC: 0 % (ref 0.0–0.2)

## 2019-07-09 LAB — ECHOCARDIOGRAM COMPLETE
Height: 68 in
Weight: 3562.63 oz

## 2019-07-09 LAB — COMPREHENSIVE METABOLIC PANEL
ALT: 96 U/L — ABNORMAL HIGH (ref 0–44)
AST: 154 U/L — ABNORMAL HIGH (ref 15–41)
Albumin: 2.8 g/dL — ABNORMAL LOW (ref 3.5–5.0)
Alkaline Phosphatase: 103 U/L (ref 38–126)
Anion gap: 17 — ABNORMAL HIGH (ref 5–15)
BUN: 119 mg/dL — ABNORMAL HIGH (ref 8–23)
CO2: 25 mmol/L (ref 22–32)
Calcium: 8.3 mg/dL — ABNORMAL LOW (ref 8.9–10.3)
Chloride: 94 mmol/L — ABNORMAL LOW (ref 98–111)
Creatinine, Ser: 4.62 mg/dL — ABNORMAL HIGH (ref 0.61–1.24)
GFR calc Af Amer: 12 mL/min — ABNORMAL LOW (ref >60)
GFR calc non Af Amer: 11 mL/min — ABNORMAL LOW (ref >60)
Glucose, Bld: 126 mg/dL — ABNORMAL HIGH (ref 70–99)
Potassium: 4 mmol/L (ref 3.5–5.1)
Sodium: 136 mmol/L (ref 135–145)
Total Bilirubin: 3.7 mg/dL — ABNORMAL HIGH (ref 0.3–1.2)
Total Protein: 5.3 g/dL — ABNORMAL LOW (ref 6.5–8.1)

## 2019-07-09 LAB — IRON AND TIBC
Iron: 44 ug/dL — ABNORMAL LOW (ref 45–182)
Saturation Ratios: 19 % (ref 17.9–39.5)
TIBC: 230 ug/dL — ABNORMAL LOW (ref 250–450)
UIBC: 186 ug/dL

## 2019-07-09 LAB — HEPATITIS B SURFACE ANTIBODY, QUANTITATIVE: Hep B S AB Quant (Post): <3.1 m[IU]/mL — ABNORMAL LOW (ref >9.9)

## 2019-07-09 MED ORDER — LACTATED RINGERS IV BOLUS
500.0000 mL | Freq: Once | INTRAVENOUS | Status: AC
  Administered 2019-07-09: 500 mL via INTRAVENOUS

## 2019-07-09 MED ORDER — RAMELTEON 8 MG PO TABS
8.0000 mg | ORAL_TABLET | Freq: Once | ORAL | Status: AC
  Administered 2019-07-09: 8 mg via ORAL
  Filled 2019-07-09: qty 1

## 2019-07-09 MED ORDER — MIDODRINE HCL 5 MG PO TABS
10.0000 mg | ORAL_TABLET | Freq: Three times a day (TID) | ORAL | Status: DC
Start: 2019-07-09 — End: 2019-07-12
  Administered 2019-07-09 – 2019-07-12 (×9): 10 mg via ORAL
  Filled 2019-07-09 (×9): qty 2

## 2019-07-09 NOTE — Progress Notes (Addendum)
Evaluated pt at bedside.  He has had ongoing hypotension and worsening renal function.  Random fever noted 2/6 and he rec'd vanc and cefepime x1 doses otherwise no further signs of infection.  He feels his breathing is doing better, he is very thirsty.  He rec'd around 700cc of LR this evening so far.  It appears he may be having a post ATN diuresis.   POCUS findings:  EF severely reduced, inferoseptal akinesis, at least moderate TR, eccentric narrow MR jet appreciated.  IVC collapsed.  Poor visualization of RV function  Plan:  -EF decreased compared to prior by my visual estimation, RMWA above was already present on prior ECHO. Repeat formal ECHO -Give another 500cc slow bolus now -Will discuss possible cardiology consult with team in am given concern for cardiorenal syndrome

## 2019-07-09 NOTE — Progress Notes (Signed)
Subjective:  Patient had eaten breakfast. He states that he feels that his breathing is better.    Objective:    Vital Signs (last 24 hours): Vitals:   07/09/19 0500 07/09/19 0746 07/09/19 0805 07/09/19 0809  BP:  (!) 100/51 (!) 106/55 (!) 106/55  Pulse: 72 72 82 74  Resp: (!) 21 (!) 22 20   Temp:  97.6 F (36.4 C) 97.9 F (36.6 C)   TempSrc:  Oral Oral   SpO2: 98% 100% 96%   Weight:      Height:        Physical Exam: General Alert and answers questions appropriately, no acute distress  Cardiac Regular rate and rhythm, no murmurs, rubs, or gallops, normal JVP  Abdomen POCUS exam demonstrated 280 ml on bladder scan  Extremities No peripheral edema    BMP Latest Ref Rng & Units 07/09/2019 07/08/2019 07/08/2019  Glucose 70 - 99 mg/dL 126(H) 162(H) 117(H)  BUN 8 - 23 mg/dL 119(H) 118(H) 115(H)  Creatinine 0.61 - 1.24 mg/dL 4.62(H) 4.42(H) 4.52(H)  BUN/Creat Ratio 10 - 24 - - -  Sodium 135 - 145 mmol/L 136 136 136  Potassium 3.5 - 5.1 mmol/L 4.0 4.0 4.7  Chloride 98 - 111 mmol/L 94(L) 99 97(L)  CO2 22 - 32 mmol/L 25 23 23   Calcium 8.9 - 10.3 mg/dL 8.3(L) 8.3(L) 8.3(L)   CBC Latest Ref Rng & Units 07/09/2019 07/08/2019 07/06/2019  WBC 4.0 - 10.5 K/uL 5.5 8.7 8.2  Hemoglobin 13.0 - 17.0 g/dL 9.6(L) 11.8(L) 12.2(L)  Hematocrit 39.0 - 52.0 % 29.4(L) 35.8(L) 37.6(L)  Platelets 150 - 400 K/uL 42(L) 52(L) 78(L)    Assessment/Plan:   Principal Problem:   Acute on chronic HFrEF (heart failure with reduced ejection fraction) (HCC) Active Problems:   Acute kidney injury superimposed on chronic kidney disease ([place])   Cirrhosis ([place])  Patient is an 86 year old male with past medical history significant for coronary artery disease status post CABG x1, ischemic cardiomyopathy status post ICD, paroxysmal atrial fibrillation, hypertension, hyperlipidemia, CVA and CKD stage III who presented to the ER on 2/5 with signs shortness of breath and lower extremity edema.  # Acute hypoxic  respiratory failure # Acute on chronic HFrEF, EF-40-45%, ischemic Patient was initially with severe volume overload and was diuresed aggressively and returned to euvolemic status.  High-resolution CT was performed for continued shortness of breath and did not demonstrate ILD but showed evidence for cirrhosis and ascites.  However, complete abdominal ultrasound revealed normal hepatic echogenicity.  Patient does have mild elevated AST/ALT but hepatitis work-up was negative. * Last EF of 40-45% on November 2020, repeat TTE ordered * Daily weights, Monitor I/Os * Continue home carvedilol 3.125 mg (hold for SBP less than 100) twice daily + simvastatin 40 mg daily  # AKI on CKD Stage III: Baseline creatinine around 2.0, elevated 3.8 on admission, up to 4.7 following diuresis. Patient given fluids (875 ml LR), now 4.62 this a.m. Urinalysis with 30 protein, no protein on prior UA 2 weeks ago. No hydronephrosis on abdominal ultrasound, bladder volume < 300 ml. Worsening renal function may be secondary to cardiorenal syndrome, but bedside ultrasound does not reveal significantly worsening EF.  * Nephrology consulted, we appreciate their recommendations  # Thrombocytopenia: Per hematology note, patient with ITP.  Platelets are downtrending and are 42 this AM.  We will continue to monitor, no indication for platelets transfusion at this time in absence of bleeding. * Patient was seen by hematologist, Dr. Julien Nordmann on 06/11/2019.  Will plan for outpatient followup  # Kidney lesion: 2.5 cm exophytic solid lesion in the upper pole of right kidney.  Recommend outpatient MRI for further work-up  PT/OT: Consulted Diet: Heart healthy DVT Ppx: Eliquis 2.5 mg twice daily Dispo: Anticipated discharge pending further medical workup  [name], MD 07/09/2019, 12:39 PM Pager: [phone]

## 2019-07-09 NOTE — Evaluation (Signed)
Physical Therapy Evaluation Patient Details Name: Ricardo Payne MRN: [id number] DOB: 01-15-1931 Today's Date: 07/09/2019   History of Present Illness  86 year old person living with ischemic heart disease and chronic kidney disease presented to the emergency department with increasing shortness of breath, found to have fluid overloaded being admitted with acute on chronic heart failure with reduced ejection fraction.  Patient has a history of ischemic heart disease with coronary artery bypass in 1990, HTN, PAF, HLD, CKD, fall.  Clinical Impression  Patient received in bed, reports he is frustrated TV is not working. Room/patient disheveled. Patient agrees to PT assessment. He requires min assist for supine to sit. Increased effot required. Fatigued with sitting up on side of bed. O2 sats dropping with minimal activity. Requires mod assist for sit to stand and pivot to recliner. Once seated he flopped back against chair. Patient will continue to benefit from skilled PT to improve strength and activity tolerance.      Follow Up Recommendations SNF    Equipment Recommendations  Other (comment)(to be determined)    Recommendations for Other Services       Precautions / Restrictions Precautions Precautions: Fall Precaution Comments: patient had fall 2 weeks ago, facial lacerations and bruising Restrictions Weight Bearing Restrictions: No      Mobility  Bed Mobility Overal bed mobility: Needs Assistance Bed Mobility: Supine to Sit     Supine to sit: Min assist     General bed mobility comments: requires increased effort to perform, use of bed rails.  Transfers Overall transfer level: Needs assistance Equipment used: None Transfers: Sit to/from [facility] Sit to Stand: Min assist Stand pivot transfers: Min assist       General transfer comment: fair standing ability from bed.  Ambulation/Gait   Gait Distance (Feet): 2 Feet Assistive device: None Gait  Pattern/deviations: Step-to pattern Gait velocity: decreased   General Gait Details: fatigued with transfer to recliner, O2 sats dropped into 80%s  Stairs            Wheelchair Mobility    Modified Rankin (Stroke Patients Only)       Balance Overall balance assessment: Needs assistance Sitting-balance support: Feet supported;Single extremity supported Sitting balance-Leahy Scale: Fair     Standing balance support: Bilateral upper extremity supported;During functional activity Standing balance-Leahy Scale: Fair                               Pertinent Vitals/Pain Pain Assessment: No/denies pain    Home Living Family/patient expects to be discharged to:: Private residence Living Arrangements: Alone Available Help at Discharge: Family Type of Home: Apartment       Home Layout: One level Home Equipment: None      Prior Function Level of Independence: Independent         Comments: reports he was driving, but has not been doing much for the last couple of months.     Hand Dominance        Extremity/Trunk Assessment   Upper Extremity Assessment Upper Extremity Assessment: Generalized weakness    Lower Extremity Assessment Lower Extremity Assessment: Generalized weakness    Cervical / Trunk Assessment Cervical / Trunk Assessment: Normal  Communication   Communication: No difficulties  Cognition Arousal/Alertness: Awake/alert Behavior During Therapy: WFL for tasks assessed/performed Overall Cognitive Status: Within Functional Limits for tasks assessed  General Comments      Exercises     Assessment/Plan    PT Assessment Patient needs continued PT services  PT Problem List Decreased strength;Decreased mobility;Decreased activity tolerance;Decreased balance;Decreased knowledge of use of DME;Decreased safety awareness;Cardiopulmonary status limiting activity       PT  Treatment Interventions DME instruction;Gait training;Therapeutic exercise;Functional mobility training;Therapeutic activities;Patient/family education    PT Goals (Current goals can be found in the Care Plan section)  Acute Rehab PT Goals Patient Stated Goal: agreeable to rehab prior to returning home, get stronger PT Goal Formulation: With patient Time For Goal Achievement: 07/23/19 Potential to Achieve Goals: Good    Frequency Min 3X/week   Barriers to discharge Decreased caregiver support reports family checks on him almost daily    Co-evaluation               AM-PAC PT "6 Clicks" Mobility  Outcome Measure Help needed turning from your back to your side while in a flat bed without using bedrails?: A Little Help needed moving from lying on your back to sitting on the side of a flat bed without using bedrails?: A Lot Help needed moving to and from a bed to a chair (including a wheelchair)?: A Little Help needed standing up from a chair using your arms (e.g., wheelchair or bedside chair)?: A Little Help needed to walk in hospital room?: A Lot Help needed climbing 3-5 steps with a railing? : A Lot 6 Click Score: 15    End of Session Equipment Utilized During Treatment: Oxygen Activity Tolerance: Patient limited by fatigue Patient left: in chair;with call bell/phone within reach;with bed alarm set;with nursing/sitter in room Nurse Communication: Mobility status PT Visit Diagnosis: Other abnormalities of gait and mobility (R26.89);History of falling (Z91.81);Difficulty in walking, not elsewhere classified (R26.2);Muscle weakness (generalized) (M62.81)    Time: [id number] PT Time Calculation (min) (ACUTE ONLY): 29 min   Charges:   PT Evaluation $PT Eval Moderate Complexity: 1 Mod PT Treatments $Therapeutic Activity: 8-22 mins        Chibuike Fleek, PT, GCS 07/09/19,2:52 PM

## 2019-07-09 NOTE — Progress Notes (Signed)
  Echocardiogram 2D Echocardiogram has been performed.  Ricardo Payne 07/09/2019, 12:18 PM

## 2019-07-09 NOTE — Consult Note (Addendum)
[facility] Renal Consultation Note  Requesting MD:  Indication for Consultation: Acute on chronic kidney injury, maintenance of euvolemia, assessment and treatment of electrolyte abnormalities, assessment and treatment of acid-base abnormalities  HPI:   Ricardo Payne is a 86 y.o. male.  With a hisory of s/p ICD, PAF, HTN, HLD, CVA, and CKD stage III.  He was admitted 07/06/2019 history of CAD status post CABG x1 1990, ischemic cardiomyopathy with ejection fraction 40 to 45% on 2D echo 04/02/2019.  There was left ventricular diastolic parameters consistent with grade 3 diastolic dysfunction elevated left atrial and left ventricular end-diastolic volumes.  He is another 2D echo ordered for 07/09/2019 results of which are pending.  He presented to the emergency department with increasing shortness of breath and increasing lower extremity edema however his chest x-ray was clear without any evidence of significant pulmonary edema his lower extremities with 2+ pitting edema and brawny skin changes and a weight that was only up 2 kg on office visit in January.  He was admitted for intravenous diuresis.  He has a history of chronic renal insufficiency and a baseline serum creatinine about 1.7 to 2.0 mg/dL.  This is from an admission in December 2020.  His current creatinine has increased during hospitalization to 4.62 mg/dL.  His creatinine on admission to the hospital was 3.82 mg/dL 07/06/2019  Blood pressure 85/52 pulse 77 temperature 97.9 O2 sats 100% 3 L nasal cannula  Sodium 136 potassium 4.0 chloride 94 CO2 25 BUN 119 creatinine 4.62 glucose 126 calcium 8.3 albumin 2.8 AST 154 ALT 96 hemoglobin 9.6 platelets 42  Medications at home; allopurinol 100 mg daily, Eliquis 2.5 mg twice daily, Bumex 1 mg daily, carvedilol 3.125 mg half tablet twice daily, Mirapex 0.25 mg daily simvastatin 40 mg daily  Medications in hospital   Eliquis 2.5 mg a Coreg 3.125 mg twice daily, Mirapex 0.25 mg twice  daily simvastatin 40 mg daily Ergocalciferol 50,000 units daily  IV furosemide 80 mg IV twice daily 07/06/2019-07/07/2019 total of 4 doses documented  Urine output 1000 cc 07/06/2019 Urine output 2.4 L 07/07/2019 Urine output 1.1 L 07/08/2019  Admission weight 101.5 kg   weight 07/06/1998 2101 kg  IV lactated Ringer's started 07/08/2019 75 cc an hour this was discontinued the same day.  Bolus of IV lactated Ringer's 500 cc 07/09/2019  Renal ultrasound showed no hydronephrosis echogenic kidneys bilaterally incidental perihepatic ascites.  Urinalysis was positive for large blood 30 mg deciliter protein.  This is different to the urinalysis 06/25/2019 that was unremarkable.  Urine sodium 23.  Urine microscopy 21-50 RBCs per high-power field     Creatinine  Date/Time Value Ref Range Status  06/11/2019 01:23 PM 1.84 (H) 0.61 - 1.24 mg/dL Final   Creatinine, Ser  Date/Time Value Ref Range Status  07/09/2019 02:55 AM 4.62 (H) 0.61 - 1.24 mg/dL Final  07/08/2019 06:45 PM 4.42 (H) 0.61 - 1.24 mg/dL Final  07/08/2019 07:14 AM 4.52 (H) 0.61 - 1.24 mg/dL Final  07/07/2019 02:38 PM 4.69 (H) 0.61 - 1.24 mg/dL Final  07/07/2019 02:20 AM 4.11 (H) 0.61 - 1.24 mg/dL Final  07/06/2019 05:53 PM 4.12 (H) 0.61 - 1.24 mg/dL Final  07/06/2019 06:28 AM 3.82 (H) 0.61 - 1.24 mg/dL Final  05/28/2019 02:05 PM 1.87 (H) 0.76 - 1.27 mg/dL Final  05/23/2019 04:12 AM 2.34 (H) 0.61 - 1.24 mg/dL Final  05/22/2019 09:51 AM 2.41 (H) 0.61 - 1.24 mg/dL Final  05/21/2019 03:35 PM 2.24 (H) 0.76 - 1.27 mg/dL  Final  05/18/2019 02:54 PM 1.75 (H) 0.76 - 1.27 mg/dL Final  03/26/2019 02:25 PM 2.04 (H) 0.76 - 1.27 mg/dL Final     PMHx:   Past Medical History:  Diagnosis Date  . Acquired thrombophilia ([place])   . Age-related cataract of both eyes   . ASHD (arteriosclerotic heart disease)   . Benign prostatic hyperplasia   . CAD (coronary artery disease)    a. s/p CABG x1 in 1990  . Cerebellar stroke ([place]) 07/02/2016  . Chronic  combined systolic and diastolic CHF (congestive heart failure) ([place])   . Chronic gout without tophus   . CKD (chronic kidney disease), stage IV ([place])   . Essential hypertension   . ICD (implantable cardioverter-defibrillator) in place   . Ischemic cardiomyopathy   . Mixed hyperlipidemia   . PAF (paroxysmal atrial fibrillation) ([place])   . Restless legs   . Secondary pulmonary arterial hypertension ([place])   . Thrombocytopenia ([place])   . Vitamin D deficiency     Past Surgical History:  Procedure Laterality Date  . CHOLECYSTECTOMY    . CORONARY ARTERY BYPASS GRAFT    . PACEMAKER IMPLANT  10/2018    Family Hx:  Family History  Problem Relation Age of Onset  . Hypertension Mother   . Suicidality Mother   . Valvular heart disease Father   . Heart disease Father   . Heart attack Brother     Social History:  reports that he quit smoking about 33 years ago. He has never used smokeless tobacco. He reports current alcohol use. He reports that he does not use drugs.  Allergies: No Known Allergies  Medications: Prior to Admission medications   Medication Sig Start Date End Date Taking? Authorizing Provider  allopurinol (ZYLOPRIM) 100 MG tablet Take 100 mg by mouth daily.   Yes [provider]  apixaban (ELIQUIS) 2.5 MG TABS tablet Take 2.5 mg by mouth 2 (two) times daily.   Yes [provider]  bumetanide (BUMEX) 1 MG tablet Take 1 tablet (1 mg total) by mouth daily. 05/24/19  Yes Dunn, Dayna N, PA-C  carvedilol (COREG) 6.25 MG tablet Take 3.125 mg by mouth 2 (two) times daily with a meal. 1/ 2  tablet (3.125mg ) twice daily   Yes [provider]  ergocalciferol (VITAMIN D2) 1.25 MG (50000 UT) capsule Take 50,000 Units by mouth once a week. Sunday   Yes [provider]  ferrous sulfate 325 (65 FE) MG tablet Take 325 mg by mouth daily with breakfast.   Yes [provider]  Folic Acid-Vit Q000111Q 123456 (FOLBEE) 2.5-25-1 MG TABS tablet Take 1 tablet by  mouth daily.   Yes [provider]  pramipexole (MIRAPEX) 0.25 MG tablet Take 0.25 mg by mouth 2 (two) times daily.   Yes [provider]  simvastatin (ZOCOR) 40 MG tablet Take 40 mg by mouth daily at 6 PM.   Yes [provider]  DUREZOL 0.05 % EMUL Place 1 drop into the right eye 2 (two) times daily. Until Wed, then 1 drop daily 05/06/19   [provider]  sulfamethoxazole-trimethoprim (BACTRIM DS) 800-160 MG tablet Take 1 tablet by mouth 2 (two) times daily. 06/28/19   [provider]     Labs:  Results for orders placed or performed during the hospital encounter of 07/06/19 (from the past 48 hour(s))  Protime-INR     Status: Abnormal   Collection Time: 07/07/19  2:38 PM  Result Value Ref Range   Prothrombin Time  28.2 (H) 11.4 - 15.2 seconds   INR 2.7 (H) 0.8 - 1.2    Comment: (NOTE) INR goal varies based on device and disease states. Performed at [facility], [place] [address] [place], [place] [address]   Hepatitis C antibody     Status: None   Collection Time: 07/07/19  2:38 PM  Result Value Ref Range   HCV Ab NON REACTIVE NON REACTIVE    Comment: (NOTE) Nonreactive HCV antibody screen is consistent with no HCV infections,  unless recent infection is suspected or other evidence exists to indicate HCV infection. Performed at [facility], [place] [address], [place], [place] [address]   Hepatitis B surface antigen     Status: None   Collection Time: 07/07/19  2:38 PM  Result Value Ref Range   Hepatitis B Surface Ag NON REACTIVE NON REACTIVE    Comment: Performed at [facility] [address], [place], [place] [address]  Hepatitis B core antibody, total     Status: None   Collection Time: 07/07/19  2:38 PM  Result Value Ref Range   Hep B Core Total Ab NON REACTIVE NON REACTIVE    Comment: Performed at [facility] [address], [place], [place] [address]  Hepatitis A antibody, IgM     Status: None    Collection Time: 07/07/19  2:38 PM  Result Value Ref Range   Hep A IgM NON REACTIVE NON REACTIVE    Comment: Performed at [facility], [place] [address] [place], [place] [address]  Comprehensive metabolic panel     Status: Abnormal   Collection Time: 07/07/19  2:38 PM  Result Value Ref Range   Sodium 135 135 - 145 mmol/L   Potassium 3.6 3.5 - 5.1 mmol/L   Chloride 101 98 - 111 mmol/L   CO2 22 22 - 32 mmol/L   Glucose, Bld 120 (H) 70 - 99 mg/dL   BUN 104 (H) 8 - 23 mg/dL   Creatinine, Ser 4.69 (H) 0.61 - 1.24 mg/dL   Calcium 8.2 (L) 8.9 - 10.3 mg/dL   Total Protein 5.9 (L) 6.5 - 8.1 g/dL   Albumin 3.2 (L) 3.5 - 5.0 g/dL   AST 113 (H) 15 - 41 U/L   ALT 61 (H) 0 - 44 U/L   Alkaline Phosphatase 95 38 - 126 U/L   Total Bilirubin 4.6 (H) 0.3 - 1.2 mg/dL   GFR calc non Af Amer 10 (L) >60 mL/min   GFR calc Af Amer 12 (L) >60 mL/min   Anion gap 12 5 - 15    Comment: Performed at [facility], [place] [address] [place], [place] [address]  Lactic acid, plasma     Status: None   Collection Time: 07/07/19  2:38 PM  Result Value Ref Range   Lactic Acid, Venous 1.6 0.5 - 1.9 mmol/L    Comment: Performed at [facility] [address], [place], [place] [address]  Culture, blood (routine x 2)     Status: None (Preliminary result)   Collection Time: 07/07/19  2:45 PM   Specimen: BLOOD  Result Value Ref Range   Specimen Description BLOOD RIGHT ANTECUBITAL    Special Requests      BOTTLES DRAWN AEROBIC AND ANAEROBIC Blood Culture adequate volume Performed at [facility], [place] [address], [place], [place] [address]    Culture NO GROWTH < 24 HOURS    Report Status  PENDING   Culture, blood (routine x 2)     Status: None (Preliminary result)   Collection Time: 07/07/19  3:02 PM   Specimen: BLOOD  Result Value Ref Range   Specimen Description BLOOD LEFT ANTECUBITAL    Special Requests      AEROBIC BOTTLE ONLY Blood Culture results may not be optimal due to an inadequate  volume of blood received in culture bottles Performed at [facility] [address], [place], [place] [address]    Culture NO GROWTH < 24 HOURS    Report Status PENDING   Comprehensive metabolic panel     Status: Abnormal   Collection Time: 07/08/19  7:14 AM  Result Value Ref Range   Sodium 136 135 - 145 mmol/L   Potassium 4.7 3.5 - 5.1 mmol/L    Comment: NO VISIBLE HEMOLYSIS   Chloride 97 (L) 98 - 111 mmol/L   CO2 23 22 - 32 mmol/L   Glucose, Bld 117 (H) 70 - 99 mg/dL   BUN 115 (H) 8 - 23 mg/dL   Creatinine, Ser 4.52 (H) 0.61 - 1.24 mg/dL   Calcium 8.3 (L) 8.9 - 10.3 mg/dL   Total Protein 6.2 (L) 6.5 - 8.1 g/dL   Albumin 3.1 (L) 3.5 - 5.0 g/dL   AST 160 (H) 15 - 41 U/L   ALT 85 (H) 0 - 44 U/L   Alkaline Phosphatase 94 38 - 126 U/L   Total Bilirubin 4.6 (H) 0.3 - 1.2 mg/dL   GFR calc non Af Amer 11 (L) >60 mL/min   GFR calc Af Amer 13 (L) >60 mL/min   Anion gap 16 (H) 5 - 15    Comment: Performed at [facility], [place] [address], [place], [place] [address]  CBC     Status: Abnormal   Collection Time: 07/08/19  7:14 AM  Result Value Ref Range   WBC 8.7 4.0 - 10.5 K/uL   RBC 3.88 (L) 4.22 - 5.81 MIL/uL   Hemoglobin 11.8 (L) 13.0 - 17.0 g/dL   HCT 35.8 (L) 39.0 - 52.0 %   MCV 92.3 80.0 - 100.0 fL   MCH 30.4 26.0 - 34.0 pg   MCHC 33.0 30.0 - 36.0 g/dL   RDW 16.1 (H) 11.5 - 15.5 %   Platelets 52 (L) 150 - 400 K/uL    Comment: REPEATED TO VERIFY Immature Platelet Fraction may be clinically indicated, consider ordering this additional test [id number] CONSISTENT WITH PREVIOUS RESULT    nRBC 0.0 0.0 - 0.2 %    Comment: Performed at [facility], [place] [address], [place], [place] [address]  Sodium, urine, random     Status: None   Collection Time: 07/08/19 11:11 AM  Result Value Ref Range   Sodium, Ur 23 mmol/L    Comment: Performed at [facility] [address] [place], [place] [address]  Osmolality, urine     Status: None   Collection Time:  07/08/19 11:11 AM  Result Value Ref Range   Osmolality, Ur 421 300 - 900 mOsm/kg    Comment: Performed at [facility] [address] [place], [place] [address]  Creatinine, urine, random     Status: None   Collection Time: 07/08/19 11:11 AM  Result Value Ref Range   Creatinine, Urine 88.96 mg/dL    Comment: Performed at [facility] [address] [place], [place] [id number]  Basic metabolic panel     Status:  Abnormal   Collection Time: 07/08/19  6:45 PM  Result Value Ref Range   Sodium 136 135 - 145 mmol/L   Potassium 4.0 3.5 - 5.1 mmol/L   Chloride 99 98 - 111 mmol/L   CO2 23 22 - 32 mmol/L   Glucose, Bld 162 (H) 70 - 99 mg/dL   BUN 118 (H) 8 - 23 mg/dL   Creatinine, Ser 4.42 (H) 0.61 - 1.24 mg/dL   Calcium 8.3 (L) 8.9 - 10.3 mg/dL   GFR calc non Af Amer 11 (L) >60 mL/min   GFR calc Af Amer 13 (L) >60 mL/min   Anion gap 14 5 - 15    Comment: Performed at [facility] [address], [place], [place] [address]  CBC     Status: Abnormal   Collection Time: 07/09/19  2:55 AM  Result Value Ref Range   WBC 5.5 4.0 - 10.5 K/uL   RBC 3.20 (L) 4.22 - 5.81 MIL/uL   Hemoglobin 9.6 (L) 13.0 - 17.0 g/dL   HCT 29.4 (L) 39.0 - 52.0 %   MCV 91.9 80.0 - 100.0 fL   MCH 30.0 26.0 - 34.0 pg   MCHC 32.7 30.0 - 36.0 g/dL   RDW 15.8 (H) 11.5 - 15.5 %   Platelets 42 (L) 150 - 400 K/uL    Comment: REPEATED TO VERIFY SPECIMEN CHECKED FOR CLOTS Immature Platelet Fraction may be clinically indicated, consider ordering this additional test [id number] CONSISTENT WITH PREVIOUS RESULT    nRBC 0.0 0.0 - 0.2 %    Comment: Performed at [facility], [place] [address] [place], [place] [address]  Comprehensive metabolic panel     Status: Abnormal   Collection Time: 07/09/19  2:55 AM  Result Value Ref Range   Sodium 136 135 - 145 mmol/L   Potassium 4.0 3.5 - 5.1 mmol/L   Chloride 94 (L) 98 - 111 mmol/L   CO2 25 22 - 32 mmol/L   Glucose, Bld 126 (H) 70 - 99 mg/dL   BUN 119  (H) 8 - 23 mg/dL   Creatinine, Ser 4.62 (H) 0.61 - 1.24 mg/dL   Calcium 8.3 (L) 8.9 - 10.3 mg/dL   Total Protein 5.3 (L) 6.5 - 8.1 g/dL   Albumin 2.8 (L) 3.5 - 5.0 g/dL   AST 154 (H) 15 - 41 U/L   ALT 96 (H) 0 - 44 U/L   Alkaline Phosphatase 103 38 - 126 U/L   Total Bilirubin 3.7 (H) 0.3 - 1.2 mg/dL   GFR calc non Af Amer 11 (L) >60 mL/min   GFR calc Af Amer 12 (L) >60 mL/min   Anion gap 17 (H) 5 - 15    Comment: Performed at [facility], [place] [address], [place], [place] [address]     ROS: General   elderly nondistressed Eyes no visual complaints blurred vision double vision loss of vision Ears nose mouth throat no hearing loss epistaxis or throat Cardiovascular denies anginal chest pain orthopnea PND palpitations blackout spells Respiratory system complains of shortness of breath to conversation no hemoptysis Abdominal system no abdominal pain diarrhea vomiting Urogenital no urgency frequency dysuria Neurologic no stroke seizures no paresthesias Dermatologic no skin rash no itching    Physical Exam: Vitals:   07/09/19 0805 07/09/19 0809  BP: (!) 106/55 (!) 106/55  Pulse: 82 74  Resp: 20   Temp: 97.9 F (36.6 C)   SpO2: 96%      General: Elderly gentleman  nondistressed ecchymosis using oxygen HEENT: Normocephalic atraumatic with right parietal laceration sutures Eyes: Nonicteric pupils round equal reactive Neck: Regular rate and rhythm mild JVD enlargement Heart: Regular rate and rhythm no  rubs gallops with faint systolic murmur left sternal edge Lungs: Symmetrical air entry tachypneic with bilateral crackles and some wheezing. Abdomen: Soft nontender nondistended bowel sounds present Extremities: 1+ pitting edema Skin: Ecchymotic skin Neuro: Alert and oriented x3  Assessment/Plan: 1.Progressive renal insufficiency.  Baseline creatinine appears to be about 1.7.  Was admitted with volume overload and acute kidney injury.  Patient is hypotensive both  relatively and absolutely.  Will discontinue antihypertensive medications at this point.  Urine sediment did show some protein and blood.  This could be consistent with an acute glomerulonephritis although the urine sediment in January 2021 was unremarkable this would make this diagnosis extremely unlikely.  2D echo is pending.  Renal ultrasound did not show any evidence of obstruction.  High resolution CT scan without the use of IV contrast was performed 07/07/2019.  This showed bibasilar atelectasis cardiomegaly cirrhotic morphology with small perihepatic ascites and atherosclerosis.  It is conceivable in addition to congestive heart failure with low ejection fraction the patient also has significant cirrhosis with liver disease.  There does not appear to be the administration of any nephrotoxins no use nonsteroidal anti-inflammatory drugs.  Patient has taken Vicodin prior to admission 2. Hypertension/volume  -appears to have some volume overload.  In the setting of congestive heart failure with systolic dysfunction and hepatic cirrhosis.  Would recommend the use of midodrine for hypotension support.  It is clear that patient would not benefit from additional IV fluids.  He may tolerate doses of Lasix should his blood pressure improved. 3.  Congestive heart failure with systolic and diastolic dysfunction ejection fraction 40% 2D echo pending.  Will discontinue Coreg at this point.  We will start midodrine 10 mg 3 times daily 4.  Anemia we will check iron studies. 5.  Disposition patient is elderly 86 year old gentleman with multiorgan failure including kidney failure cirrhosis and congestive heart failure.  I would recommend that discussions initiated with palliative care.     Sherril Croon 07/09/2019, 1:59 PM

## 2019-07-10 DIAGNOSIS — Z7189 Other specified counseling: Secondary | ICD-10-CM

## 2019-07-10 DIAGNOSIS — K7460 Unspecified cirrhosis of liver: Secondary | ICD-10-CM

## 2019-07-10 DIAGNOSIS — N189 Chronic kidney disease, unspecified: Secondary | ICD-10-CM

## 2019-07-10 DIAGNOSIS — I5023 Acute on chronic systolic (congestive) heart failure: Secondary | ICD-10-CM

## 2019-07-10 DIAGNOSIS — Z515 Encounter for palliative care: Secondary | ICD-10-CM

## 2019-07-10 DIAGNOSIS — J9601 Acute respiratory failure with hypoxia: Secondary | ICD-10-CM

## 2019-07-10 DIAGNOSIS — N179 Acute kidney failure, unspecified: Secondary | ICD-10-CM

## 2019-07-10 LAB — CBC
HCT: 31.3 % — ABNORMAL LOW (ref 39.0–52.0)
Hemoglobin: 10.3 g/dL — ABNORMAL LOW (ref 13.0–17.0)
MCH: 30.4 pg (ref 26.0–34.0)
MCHC: 32.9 g/dL (ref 30.0–36.0)
MCV: 92.3 fL (ref 80.0–100.0)
Platelets: 56 10*3/uL — ABNORMAL LOW (ref 150–400)
RBC: 3.39 MIL/uL — ABNORMAL LOW (ref 4.22–5.81)
RDW: 15.7 % — ABNORMAL HIGH (ref 11.5–15.5)
WBC: 7.4 10*3/uL (ref 4.0–10.5)
nRBC: 0 % (ref 0.0–0.2)

## 2019-07-10 LAB — RENAL FUNCTION PANEL
Albumin: 3 g/dL — ABNORMAL LOW (ref 3.5–5.0)
Anion gap: 14 (ref 5–15)
BUN: 120 mg/dL — ABNORMAL HIGH (ref 8–23)
CO2: 25 mmol/L (ref 22–32)
Calcium: 8.5 mg/dL — ABNORMAL LOW (ref 8.9–10.3)
Chloride: 96 mmol/L — ABNORMAL LOW (ref 98–111)
Creatinine, Ser: 4.3 mg/dL — ABNORMAL HIGH (ref 0.61–1.24)
GFR calc Af Amer: 13 mL/min — ABNORMAL LOW (ref >60)
GFR calc non Af Amer: 12 mL/min — ABNORMAL LOW (ref >60)
Glucose, Bld: 117 mg/dL — ABNORMAL HIGH (ref 70–99)
Phosphorus: 3.2 mg/dL (ref 2.5–4.6)
Potassium: 4.1 mmol/L (ref 3.5–5.1)
Sodium: 135 mmol/L (ref 135–145)

## 2019-07-10 LAB — SARS CORONAVIRUS 2 (TAT 6-24 HRS): SARS Coronavirus 2: NEGATIVE

## 2019-07-10 MED ORDER — CLOTRIMAZOLE 10 MG MT TROC
10.0000 mg | Freq: Every day | OROMUCOSAL | Status: DC
  Administered 2019-07-10 – 2019-07-12 (×11): 10 mg via ORAL
  Filled 2019-07-10 (×14): qty 1

## 2019-07-10 NOTE — Discharge Instructions (Signed)

## 2019-07-10 NOTE — Consult Note (Signed)
Consultation Note Date: 07/10/2019   Patient Name: Ricardo Payne  DOB: [birth year]  MRN: [id number]  Age / Sex: 86 y.o., male  PCP: [name], FNP Referring Physician: Axel Filler, *  Reason for Consultation: Establishing goals of care  HPI/Patient Profile: 86 y.o. male  with past medical history of CAD s/p CABG in 1990, ischemic cardiomyopathy, ICD placement 2019, atrial fibrillation (anticoagulation on hold due to recent fall and small subarachnoid bleed), heart failure, hypertension, hyperlipidemia, CVA 2018, CKD stage III admitted on 07/06/2019 with shortness of breath and lower extremity edema. Patient initially severely fluid overloaded and was aggressively diuresed. Patient with acute on chronic heart failure with EF <20% (severely reduced from 40-45% in November 2020) now with AKI with baseline CKD stage III, likely due to cardiorenal syndrome. Cardiology and nephrology following. Per cardiology, patient is not a reasonable candidate for advanced heart failure therapies, perhaps inotropic support could transiently improve clinical condition but with irreversible heart condition and poor prognosis. Palliative medicine consultation for goals of care.   Clinical Assessment and Goals of Care:  I have reviewed medical records, discussed with care team, and assessed the patient at bedside.   I introduced Palliative Medicine as specialized medical care for people living with serious illness. It focuses on providing relief from the symptoms and stress of a serious illness. The goal is to improve quality of life for both the patient and the family.  Ricardo Payne is awake but very drowsy this afternoon and shares that he was planning to nap. He appears mildly short of breath at rest. Oxygen sats stable on room air. No family at bedside. Explained to Ricardo Payne that I would let him rest today but  arrange a meeting with his son tomorrow. Patient agreeable.   Spoke with son, Dwayne via telephone. Introduced role of palliative medicine.   Dwayne shares that his father finally moved to a condo in [place] from [place] in September 2020 (son had been trying to get him to move for awhile). Dwayne lives about 10 minutes from patient and checks in on him at least 2x a week. Dwayne has been trying to maintain his father's independence but shares his belief that his father waited too long before coming to the hospital.   Briefly discussed course of hospitalization including diagnoses, interventions, plan of care. Explained irreversible nature of his heart condition and this now unfortunately leading to multi-organ failure. Reviewed recommendations from cardiology and nephrology.   Patient does not have a documented living will. Explained to son that we will further discuss goals/advance directive tomorrow when he visits bedside. [place] meeting scheduled for tomorrow 2/10 at 3pm with patient and son.    SUMMARY OF RECOMMENDATIONS    Full code/full scope. Continue current plan of care.  Ongoing palliative discussions. [place] meeting scheduled with patient/son tomorrow, 2/10 at 3pm.  Son shares that his father does NOT have an advance directive. Will further discuss tomorrow.    Code Status/Advance Care Planning:  Full code  Symptom Management:  Per attending  Palliative Prophylaxis:   Aspiration, Delirium Protocol, Oral Care and Turn Reposition  Psycho-social/Spiritual:   Desire for further Chaplaincy support: yes  Additional Recommendations: Caregiving  Support/Resources, Compassionate Wean Education and Education on Hospice  Prognosis:   Poor long-term prognosis  Discharge Planning: To Be Determined: likely will need SNF rehab     Primary Diagnoses: Present on Admission: . (Resolved) Acute respiratory failure with hypoxia ([place]) . Acute on chronic HFrEF (heart failure with  reduced ejection fraction) ([place]) . Acute kidney injury superimposed on chronic kidney disease ([place])   I have reviewed the medical record, interviewed the patient and family, and examined the patient. The following aspects are pertinent.  Past Medical History:  Diagnosis Date  . Acquired thrombophilia ([place])   . Age-related cataract of both eyes   . ASHD (arteriosclerotic heart disease)   . Benign prostatic hyperplasia   . CAD (coronary artery disease)    a. s/p CABG x1 in 1990  . Cerebellar stroke ([place]) 07/02/2016  . Chronic combined systolic and diastolic CHF (congestive heart failure) ([place])   . Chronic gout without tophus   . CKD (chronic kidney disease), stage IV ([place])   . Essential hypertension   . ICD (implantable cardioverter-defibrillator) in place   . Ischemic cardiomyopathy   . Mixed hyperlipidemia   . PAF (paroxysmal atrial fibrillation) ([place])   . Restless legs   . Secondary pulmonary arterial hypertension ([place])   . Thrombocytopenia ([place])   . Vitamin D deficiency    Social History   Socioeconomic History  . Marital status: Widowed    Spouse name: Not on file  . Number of children: 2  . Years of education: Not on file  . Highest education level: Not on file  Occupational History  . Not on file  Tobacco Use  . Smoking status: Former Smoker    Quit date: 1988    Years since quitting: 33.1  . Smokeless tobacco: Never Used  Substance and Sexual Activity  . Alcohol use: Yes    Comment: rare  . Drug use: Never  . Sexual activity: Not on file  Other Topics Concern  . Not on file  Social History Narrative  . Not on file   Social Determinants of Health   Financial Resource Strain:   . Difficulty of Paying Living Expenses: Not on file  Food Insecurity:   . Worried About Charity fundraiser in the Last Year: Not on file  . Ran Out of Food in the Last Year: Not on file  Transportation Needs:   . Lack of Transportation (Medical): Not on file  . Lack of  Transportation (Non-Medical): Not on file  Physical Activity:   . Days of Exercise per Week: Not on file  . Minutes of Exercise per Session: Not on file  Stress:   . Feeling of Stress : Not on file  Social Connections:   . Frequency of Communication with Friends and Family: Not on file  . Frequency of Social Gatherings with Friends and Family: Not on file  . Attends Religious Services: Not on file  . Active Member of Clubs or Organizations: Not on file  . Attends Archivist Meetings: Not on file  . Marital Status: Not on file   Family History  Problem Relation Age of Onset  . Hypertension Mother   . Suicidality Mother   . Valvular heart disease Father   . Heart disease Father   . Heart attack Brother  Scheduled Meds: . apixaban  2.5 mg Oral BID  . Chlorhexidine Gluconate Cloth  6 each Topical Daily  . folic acid-pyridoxine-cyancobalamin  1 tablet Oral Daily  . mouth rinse  15 mL Mouth Rinse BID  . midodrine  10 mg Oral TID WC  . pramipexole  0.25 mg Oral BID  . simvastatin  40 mg Oral q1800  . thiamine  100 mg Oral Daily  . Vitamin D (Ergocalciferol)  50,000 Units Oral Q7 days   Continuous Infusions: PRN Meds:.acetaminophen **OR** acetaminophen, levalbuterol, ondansetron **OR** ondansetron (ZOFRAN) IV Medications Prior to Admission:  Prior to Admission medications   Medication Sig Start Date End Date Taking? Authorizing Provider  allopurinol (ZYLOPRIM) 100 MG tablet Take 100 mg by mouth daily.   Yes [provider]  apixaban (ELIQUIS) 2.5 MG TABS tablet Take 2.5 mg by mouth 2 (two) times daily.   Yes [provider]  bumetanide (BUMEX) 1 MG tablet Take 1 tablet (1 mg total) by mouth daily. 05/24/19  Yes Dunn, Dayna N, PA-C  carvedilol (COREG) 6.25 MG tablet Take 3.125 mg by mouth 2 (two) times daily with a meal. 1/ 2  tablet (3.125mg ) twice daily   Yes [provider]  ergocalciferol (VITAMIN D2) 1.25 MG (50000 UT) capsule Take 50,000  Units by mouth once a week. Sunday   Yes [provider]  ferrous sulfate 325 (65 FE) MG tablet Take 325 mg by mouth daily with breakfast.   Yes [provider]  Folic Acid-Vit Q000111Q 123456 (FOLBEE) 2.5-25-1 MG TABS tablet Take 1 tablet by mouth daily.   Yes [provider]  pramipexole (MIRAPEX) 0.25 MG tablet Take 0.25 mg by mouth 2 (two) times daily.   Yes [provider]  simvastatin (ZOCOR) 40 MG tablet Take 40 mg by mouth daily at 6 PM.   Yes [provider]  DUREZOL 0.05 % EMUL Place 1 drop into the right eye 2 (two) times daily. Until Wed, then 1 drop daily 05/06/19   [provider]  sulfamethoxazole-trimethoprim (BACTRIM DS) 800-160 MG tablet Take 1 tablet by mouth 2 (two) times daily. 06/28/19   [provider]   No Known Allergies Review of Systems  Constitutional: Positive for activity change.  Respiratory: Positive for shortness of breath.    Physical Exam Vitals and nursing note reviewed.  Constitutional:      Appearance: He is ill-appearing.  Cardiovascular:     Rate and Rhythm: Normal rate.  Pulmonary:     Comments: Dyspnea at rest. Oxygen sats stable on RA Skin:    General: Skin is warm and dry.  Neurological:     Mental Status: He is easily aroused.     Comments: Oriented but drowsy this afternoon   Psychiatric:        Attention and Perception: He is inattentive.        Speech: Speech is delayed.     Vital Signs: BP (!) 123/55 (BP Location: Left Arm)   Pulse 77   Temp 98.3 F (36.8 C) (Oral)   Resp (!) 27   Ht 5\' 8"  (1.727 m)   Wt 103 kg   SpO2 96%   BMI 34.53 kg/m  Pain Scale: 0-10 POSS *See Group Information*: S-Acceptable,Sleep, easy to arouse Pain Score: 0-No pain   SpO2: SpO2: 96 % O2 Device:SpO2: 96 % O2 Flow Rate: .O2 Flow Rate (L/min): 3 L/min  IO: Intake/output summary:   Intake/Output Summary (Last 24 hours) at 07/10/2019 1411 Last data filed  at 07/10/2019 1100 Gross per 24 hour    Intake 360 ml  Output 1175 ml  Net -815 ml    LBM: Last BM Date: 07/03/19 Baseline Weight: Weight: 101.5 kg Most recent weight: Weight: 103 kg     Palliative Assessment/Data: PPS 40%     Time In: 1300 Time Out: 1340 Time Total: 40 Greater than 50%  of this time was spent counseling and coordinating care related to the above assessment and plan.  Signed by:  Ihor Dow, DNP, FNP-C Palliative Medicine Team  Phone: [phone] Fax: [phone]   Please contact Palliative Medicine Team phone at [phone] for questions and concerns.  For individual provider: See Shea Evans

## 2019-07-10 NOTE — Progress Notes (Addendum)
Physical Therapy Treatment Patient Details Name: Ricardo Payne MRN: [id number] DOB: 1931-03-02 Today's Date: 07/10/2019    History of Present Illness Pt is an 86 y.o. male admitted 07/06/19 with worsening SOB. Found to have fluid overload; worked up for acute CHF with reduced EF. PMH includes CKD, ischemic heart disease, HTN, PAF, HLD, ICD. Of note, pt with recent fall.   PT Comments    Pt slowly progressing with mobility. DOE 3/4 with minimal activity, including standing or even repositioning in bed. SpO2 >/95% on RA. BP 128/60, HR 81. Pt requiring up to minA for mobility; limited by generalized weakness, fatigue and decreased activity tolerance. Agreeable to SNF-level therapies to maximize functional mobility. Recommend palliative medicine consult for goals of care (noted this order has already been placed by MD).    Follow Up Recommendations  SNF;Supervision for mobility/OOB     Equipment Recommendations  (TBD)    Recommendations for Other Services Palliative medicine     Precautions / Restrictions Precautions Precautions: Fall Precaution Comments: patient had fall 2 weeks ago, facial lacerations and bruising Restrictions Weight Bearing Restrictions: No    Mobility  Bed Mobility Overal bed mobility: Needs Assistance Bed Mobility: Supine to Sit;Sit to Supine     Supine to sit: Min assist Sit to supine: Independent   General bed mobility comments: MinA for UE support to elevate trunk; independent to return to supine with bed flat  Transfers Overall transfer level: Needs assistance Equipment used: Rolling walker (2 wheeled) Transfers: Sit to/from Stand;Lateral/Scoot Transfers Sit to Stand: Min guard Stand pivot transfers: Min assist      Lateral/Scoot Transfers: Supervision General transfer comment: Pt able to perform multiple stands from EOB without DME, reliant on momentum to power up, min guard for balance  Ambulation/Gait             General Gait  Details: Pt declined mobility beyond scooting higher towards [facility]; requesting return to supine   Stairs             Wheelchair Mobility    Modified Rankin (Stroke Patients Only)       Balance Overall balance assessment: Needs assistance Sitting-balance support: No upper extremity supported;Feet supported Sitting balance-Leahy Scale: Good     Standing balance support: Bilateral upper extremity supported;During functional activity Standing balance-Leahy Scale: Fair Standing balance comment: relaint on BUE support                            Cognition Arousal/Alertness: Awake/alert Behavior During Therapy: WFL for tasks assessed/performed;Flat affect Overall Cognitive Status: No family/caregiver present to determine baseline cognitive functioning Area of Impairment: Memory;Following commands;Safety/judgement;Awareness;Problem solving                 Orientation Level: Disoriented to;Time(but able to correct himself after asked a 2nd time )   Memory: Decreased short-term memory Following Commands: Follows one step commands consistently;Follows multi-step commands inconsistently Safety/Judgement: Decreased awareness of safety;Decreased awareness of deficits Awareness: Emergent Problem Solving: Requires verbal cues General Comments: Appropriate conversation and following commands; poor insight into deficits. Pleasant, but difficulty reasoning with regarding importance of more frequent mobility      Exercises Other Exercises Other Exercises: AROM bilateral knee flexion (heel slides) and ankle circles    General Comments General comments (skin integrity, edema, etc.): BP 128/60, SpO2 95% on RA, HR 81. Noted bilateral lower leg swelling; pt denies pain, encouraged elevation and knee/ankle ROM      Pertinent Vitals/Pain  Pain Assessment: No/denies pain    Home Living Family/patient expects to be discharged to:: Private residence Living Arrangements:  Alone Available Help at Discharge: Family Type of Home: Apartment     Home Layout: One level Home Equipment: Shower seat      Prior Function Level of Independence: Independent      Comments: independent ADLs, IADLs, driving    PT Goals (current goals can now be found in the care plan section) Acute Rehab PT Goals Patient Stated Goal: Agreeable to post-acute rehab to regain strength before returning home PT Goal Formulation: With patient Progress towards PT goals: Progressing toward goals    Frequency    Min 3X/week      PT Plan Current plan remains appropriate    Co-evaluation              AM-PAC PT "6 Clicks" Mobility   Outcome Measure  Help needed turning from your back to your side while in a flat bed without using bedrails?: None Help needed moving from lying on your back to sitting on the side of a flat bed without using bedrails?: A Little Help needed moving to and from a bed to a chair (including a wheelchair)?: A Little Help needed standing up from a chair using your arms (e.g., wheelchair or bedside chair)?: A Little Help needed to walk in hospital room?: A Lot Help needed climbing 3-5 steps with a railing? : A Lot 6 Click Score: 17    End of Session   Activity Tolerance: Patient limited by fatigue Patient left: in bed;with call bell/phone within reach;with bed alarm set Nurse Communication: Mobility status PT Visit Diagnosis: Other abnormalities of gait and mobility (R26.89);History of falling (Z91.81);Difficulty in walking, not elsewhere classified (R26.2);Muscle weakness (generalized) (M62.81)     Time: [id number] PT Time Calculation (min) (ACUTE ONLY): 20 min  Charges:  $Therapeutic Activity: 8-22 mins                    Mabeline Caras, PT, DPT Acute Rehabilitation Services  Pager [phone] Office [phone]  Derry Lory 07/10/2019, 2:52 PM

## 2019-07-10 NOTE — Progress Notes (Signed)
Patient ID: Ricardo Payne, male   DOB: [birth year], 86 y.o.   MRN: [id number] [facility] Progress Note   Assessment/ Plan:   1. Acute kidney Injury on chronic kidney disease stage IIIb: Suspected to be hemodynamically mediated in the setting of diastolic dysfunction and relative hypotension with efforts at volume unloading.  Renal function slightly better this morning on labs although azotemia persists; without acute electrolyte abnormalities prompting intervention.  No evidence of hydronephrosis on renal ultrasound.  Given overall burden of comorbidities, agree with palliative care discussions. 2.  Volume overload in the setting of decompensated CHF/cirrhosis: Antihypertensive therapy/beta-blocker discontinued yesterday and patient started on midodrine with corresponding improvement of blood pressure.  We will continue to follow this and redose intermittent diuretics as creatinine continues to trend down. 3.  Anemia of chronic disease: With low iron saturation and low ferritin, recommend intravenous iron. 4.  Hypertension: Carvedilol appropriately discontinued yesterday secondary to relative hypotension and patient started on midodrine for blood pressure support in the setting of AKI.  Subjective:   Reports to be feeling fair, states that "breathing fine".   Objective:   BP 124/61 (BP Location: Left Arm)   Pulse 77   Temp 98.3 F (36.8 C) (Oral)   Resp (!) 23   Ht 5\' 8"  (1.727 m)   Wt 103 kg   SpO2 93%   BMI 34.53 kg/m   Intake/Output Summary (Last 24 hours) at 07/10/2019 0734 Last data filed at 07/09/2019 2224 Gross per 24 hour  Intake --  Output 750 ml  Net -750 ml   Weight change:   Physical Exam: Gen: Comfortably resting in bed, awake and alert CVS: Pulse regular rhythm, normal rate, ESM over apex. Resp: Diminished breath sounds over bases with fine rales left side.  No wheeze. Abd: Soft, obese, nontender Ext: 1+ lower extremity edema  Imaging: US  RENAL  Result Date: 07/09/2019 CLINICAL DATA:  Inpatient.  Acute kidney injury. EXAM: RENAL / URINARY TRACT ULTRASOUND COMPLETE COMPARISON:  07/07/2019 abdominal sonogram. FINDINGS: Right Kidney: Renal measurements: 11.4 x 4.4 x 5.1 cm = volume: 133 mL. Echogenic right renal parenchyma, normal thickness. No hydronephrosis. Simple 3.7 x 2.6 x 3.2 cm interpolar right renal cyst. Separate simple 2.9 x 2.4 x 2.7 cm interpolar right renal cyst. Left Kidney: Renal measurements: 11.6 x 4.6 x 4.3 cm = volume: 119 mL. Echogenic left renal parenchyma, normal thickness. No hydronephrosis. No renal masses. Bladder: Appears normal for degree of bladder distention. Ureteral jets demonstrated bilaterally in the bladder. Other: Perihepatic ascites. IMPRESSION: 1. No hydronephrosis. 2. Echogenic normal size kidneys, compatible with reported history of nonspecific acute renal parenchymal disease. 3. Simple right renal cyst. 4. Normal bladder. 5. Incidental perihepatic ascites. Electronically Signed   By: Ilona Sorrel M.D.   On: 07/09/2019 10:39   ECHOCARDIOGRAM COMPLETE  Result Date: 07/09/2019    ECHOCARDIOGRAM REPORT   Patient Name:   Ricardo Payne Date of Exam: 07/09/2019 Medical Rec #:  [id number]               Height:       68.0 in Accession #:    [id number]              Weight:       222.7 lb Date of Birth:  July 02, 1930                BSA:          2.14 m Patient Age:  88 years                BP:           85/52 mmHg Patient Gender: M                       HR:           65 bpm. Exam Location:  Inpatient Procedure: 2D Echo, Cardiac Doppler and Color Doppler Indications:    I50.23 Acute on chronic systolic (congestive) heart failure  History:        Patient has prior history of Echocardiogram examinations, most                 recent 04/02/2019. Cardiomyopathy, CAD, Defibrillator, Stroke and                 Pulmonary HTN, Arrythmias:Atrial Fibrillation; Risk                 Factors:Hypertension and Dyslipidemia. CKD.   Sonographer:    Jonelle Sidle Dance Referring Phys: [id number] [place]  1. Left ventricular ejection fraction, by estimation, is <20%. The left ventricle has severely decreased function. The left ventrical demonstrates global hypokinesis. The left ventricular internal cavity size was mildly dilated. Left ventricular diastolic parameters are indeterminate.  2. Right ventricular systolic function is moderately reduced. The right ventricular size is mildly enlarged. There is moderately elevated pulmonary artery systolic pressure. The estimated right ventricular systolic pressure is [id number] mmHg.  3. Mild to moderate mitral valve regurgitation.  4. Tricuspid valve regurgitation is severe.  5. The aortic valve is tricuspid. Mild aortic valve stenosis, mean gradient only 6 mmHg but visually at least mild AS. No aortic insufficiency.  6. Aortic dilatation noted. There is mild dilatation of the aortic root measuring 41 mm.  7. Dilated IVC suggesting RA pressure 15 mmHg.  8. Rhythm difficult to discern, may be atrial fibrillation. FINDINGS  Left Ventricle: Left ventricular ejection fraction, by estimation, is <20%. The left ventricle has severely decreased function. The left ventricle demonstrates global hypokinesis. The left ventricular internal cavity size was mildly dilated. There is no  left ventricular hypertrophy. Right Ventricle: The right ventricular size is mildly enlarged. No increase in right ventricular wall thickness. Right ventricular systolic function is moderately reduced. There is moderately elevated pulmonary artery systolic pressure. The tricuspid regurgitant velocity is 2.82 m/s, and with an assumed right atrial pressure of 15 mmHg, the estimated right ventricular systolic pressure is [id number] mmHg. Left Atrium: Left atrial size was moderately dilated. Right Atrium: Right atrial size was moderately dilated. Pericardium: Trivial pericardial effusion is present. Mitral Valve: The mitral valve is  normal in structure and function. There is mild calcification of the mitral valve leaflet(s). Moderate mitral annular calcification. Mild to moderate mitral valve regurgitation. Tricuspid Valve: The tricuspid valve is normal in structure. Tricuspid valve regurgitation is severe. Aortic Valve: The aortic valve is tricuspid. Aortic valve regurgitation is not visualized. Aortic regurgitation PHT measures 754 msec. Mild aortic stenosis is present. Aortic valve mean gradient measures 6.0 mmHg. Aortic valve peak gradient measures 11.0  mmHg. Aortic valve area, by VTI measures 1.16 cm. Pulmonic Valve: The pulmonic valve was normal in structure. Pulmonic valve regurgitation is trivial. Aorta: Aortic dilatation noted. There is mild dilatation of the aortic root measuring 41 mm. Venous: The inferior vena cava is dilated in size with less than 50% respiratory variability, suggesting right atrial pressure of 15 mmHg. IAS/Shunts: No atrial  level shunt detected by color flow Doppler. Additional Comments: A pacer wire is visualized in the right ventricle.  LEFT VENTRICLE PLAX 2D LVIDd:         6.30 cm LVIDs:         5.50 cm LV PW:         1.20 cm LV IVS:        1.00 cm LVOT diam:     2.40 cm LV SV:         36.58 ml LV SV Index:   24.03 LVOT Area:     4.52 cm  RIGHT VENTRICLE            IVC RV Basal diam:  3.70 cm    IVC diam: 2.60 cm RV Mid diam:    3.10 cm RV S prime:     4.22 cm/s TAPSE (M-mode): 1.7 cm LEFT ATRIUM              Index       RIGHT ATRIUM           Index LA diam:        5.70 cm  2.66 cm/m  RA Area:     26.90 cm LA Vol (A2C):   106.0 ml 49.55 ml/m RA Volume:   101.00 ml 47.22 ml/m LA Vol (A4C):   102.0 ml 47.68 ml/m LA Biplane Vol: 105.0 ml 49.09 ml/m  AORTIC VALVE AV Area (Vmax):    1.27 cm AV Area (Vmean):   1.29 cm AV Area (VTI):     1.16 cm AV Vmax:           165.50 cm/s AV Vmean:          116.000 cm/s AV VTI:            0.314 m AV Peak Grad:      11.0 mmHg AV Mean Grad:      6.0 mmHg LVOT Vmax:          46.60 cm/s LVOT Vmean:        33.000 cm/s LVOT VTI:          0.081 m LVOT/AV VTI ratio: 0.26 AI PHT:            754 msec  AORTA Ao Root diam: 4.10 cm Ao Asc diam:  3.50 cm MITRAL VALVE                       TRICUSPID VALVE MV Area (PHT): 2.66 cm            TR Peak grad:   31.8 mmHg MV Decel Time: 286 msec            TR Vmax:        282.00 cm/s MV E velocity: 76.15 cm/s 103 cm/s                                    SHUNTS                                    Systemic VTI:  0.08 m                                    Systemic Diam: 2.40 cm [employer]  MD Electronically signed by Loralie Champagne MD Signature Date/Time: 07/09/2019/6:01:16 PM    Final     Labs: BMET Recent Labs  Lab 07/06/19 1753 07/07/19 0220 07/07/19 1438 07/08/19 0714 07/08/19 1845 07/09/19 0255 07/10/19 0157  NA 135 136 135 136 136 136 135  K 4.7 4.2 3.6 4.7 4.0 4.0 4.1  CL 98 100 101 97* 99 94* 96*  CO2 20* 21* 22 23 23 25 25   GLUCOSE 137* 117* 120* 117* 162* 126* 117*  BUN 83* 91* 104* 115* 118* 119* 120*  CREATININE 4.12* 4.11* 4.69* 4.52* 4.42* 4.62* 4.30*  CALCIUM 8.9 8.7* 8.2* 8.3* 8.3* 8.3* 8.5*  PHOS 4.1  --   --   --   --   --  3.2   CBC Recent Labs  Lab 07/06/19 0628 07/08/19 0714 07/09/19 0255 07/10/19 0157  WBC 8.2 8.7 5.5 7.4  HGB 12.2* 11.8* 9.6* 10.3*  HCT 37.6* 35.8* 29.4* 31.3*  MCV 93.8 92.3 91.9 92.3  PLT 78* 52* 42* 56*    Medications:    . apixaban  2.5 mg Oral BID  . Chlorhexidine Gluconate Cloth  6 each Topical Daily  . folic acid-pyridoxine-cyancobalamin  1 tablet Oral Daily  . mouth rinse  15 mL Mouth Rinse BID  . midodrine  10 mg Oral TID WC  . pramipexole  0.25 mg Oral BID  . simvastatin  40 mg Oral q1800  . thiamine  100 mg Oral Daily  . Vitamin D (Ergocalciferol)  50,000 Units Oral Q7 days   Elmarie Shiley, MD 07/10/2019, 7:34 AM

## 2019-07-10 NOTE — Progress Notes (Signed)
Subjective:   Ricardo Payne was seen laying in his bed. Patient counseled on the chronic nature of his medical condition and that we are limited in what additional therapies we can offer. We discussed that he would be further evaluated by cardiology and we would ask palliative care to come by and talk with him. All questions answered.  Objective:    Vital Signs (last 24 hours): Vitals:   07/10/19 0500 07/10/19 0730 07/10/19 1224 07/10/19 1239  BP:  124/61  (!) 123/55  Pulse:  77 79 77  Resp:  (!) 23 (!) 32 (!) 27  Temp:  98.3 F (36.8 C)  98.3 F (36.8 C)  TempSrc:  Oral  Oral  SpO2:  93% 93% 96%  Weight: 103 kg     Height:       Physical Exam: General Alert and answers questions appropriately, no acute distress  Cardiac Regular rate and rhythm, no murmurs, rubs, or gallops, JVP WNL at 8 cm  Extremities No peripheral edema   BMP Latest Ref Rng & Units 07/10/2019 07/09/2019 07/08/2019  Glucose 70 - 99 mg/dL 117(H) 126(H) 162(H)  BUN 8 - 23 mg/dL 120(H) 119(H) 118(H)  Creatinine 0.61 - 1.24 mg/dL 4.30(H) 4.62(H) 4.42(H)  BUN/Creat Ratio 10 - 24 - - -  Sodium 135 - 145 mmol/L 135 136 136  Potassium 3.5 - 5.1 mmol/L 4.1 4.0 4.0  Chloride 98 - 111 mmol/L 96(L) 94(L) 99  CO2 22 - 32 mmol/L 25 25 23   Calcium 8.9 - 10.3 mg/dL 8.5(L) 8.3(L) 8.3(L)    CBC Latest Ref Rng & Units 07/10/2019 07/09/2019 07/08/2019  WBC 4.0 - 10.5 K/uL 7.4 5.5 8.7  Hemoglobin 13.0 - 17.0 g/dL 10.3(L) 9.6(L) 11.8(L)  Hematocrit 39.0 - 52.0 % 31.3(L) 29.4(L) 35.8(L)  Platelets 150 - 400 K/uL 56(L) 42(L) 52(L)   Assessment/Plan:   Principal Problem:   Acute on chronic HFrEF (heart failure with reduced ejection fraction) (HCC) Active Problems:   Acute kidney injury superimposed on chronic kidney disease ([place])   Cirrhosis ([place])  Patient is an 86 year old male with past medical history significant for coronary artery disease status post CABG x1, ischemic cardiomyopathy status post ICD, paroxysmal atrial  fibrillation on Eliquis, hypertension, hyperlipidemia, CVA and CKD stage III who presented to the ER on 2/5 with signs of worsening shortness of breath and lower extremity edema.  # Acute hypoxic respiratory failure # Acute on chronic HFrEF, EF <20%, ischemic Patient was initially with severe volume overload and was diuresed aggressively and returned to euvolemic status.  High-resolution CT was performed for continued shortness of breath and did not demonstrate ILD but did show evidence for cirrhosis and ascites.  However, complete abdominal ultrasound revealed normal hepatic echogenicity.  Platelets are low but patient has ITP and INR is elevated but patient is on Eliquis.  Patient does have mild elevated AST/ALT but hepatitis work-up was negative.   * TTE yesterday demonstrated several reduced EF <20%, newly worsened from EF 40-45% in November * Continue midodrine 10 mg three times daily * Daily weights, monitor I/Os * Cardiology consulted, they assess that patient may have transient improvement with inotropic support but there is no long-term fix for patient's underlying condition * Palliative care consulted  # AKI on CKD Stage III: Baseline creatinine around 2.0, elevated to 3.8 on admission. Trend: 3.82 (2/5) -> 4.11 -> 4.52 -> 4.62 -> 4.30 (2/9).  Patient's renal function did not respond to fluid trial.  Urinalysis showed 30 protein, no protein on  prior UA 2 weeks ago.  No hydronephrosis on abdominal ultrasound.  Worsening renal function likely secondary to cardiorenal syndrome. * Nephrology recommends hemodynamic support with midodrine and intermittent diuretics as creatinine improves  # Thrombocytopenia: Per hematology note, patient with ITP.  Platelet trend: 78 (2/5) -> 52 -> 42 -> 56.  We will continue to monitor, no indicated for platelet transfusion at this time in the absence of bleeding.  # Kidney lesion: 2.5 cm exophytic solid lesion in the upper pole the right kidney, recommend  outpatient MRI for further work-up.  PT/OT: Recommend SNF TOC: Consulted for SNF placement Diet: Heart DVT Ppx: Eliquis 2.5 mg twice daily for PAF Dispo: Anticipated discharge pending clinical improvement  [name], MD 07/10/2019, 2:23 PM

## 2019-07-10 NOTE — Evaluation (Signed)
Occupational Therapy Evaluation Patient Details Name: Ricardo Payne MRN: [id number] DOB: [birth year] Today's Date: 07/10/2019    History of Present Illness 86 year old person living with ischemic heart disease and chronic kidney disease presented to the emergency department with increasing shortness of breath, found to have fluid overloaded being admitted with acute on chronic heart failure with reduced ejection fraction.  Patient has a history of ischemic heart disease with coronary artery bypass in 1990, HTN, PAF, HLD, CKD, fall.   Clinical Impression   PTA patient independent and driving.  Admitted for above and limited by problem list below, including generalized weakness, decreased activity tolerance, impaired balance.  Cognitively, patient initially lethargic and disoriented to year; with improved alertness able to engage and follow commands with increased time and oriented to year.  He currently requires min assist for transfers, supervision for UB ADLs and min-mod assist for LB ADLs.  He will benefit from continued OT services while admitted and after dc at SNF level in order to optimize independence and return to PLOF with ADLs, mobility.     Follow Up Recommendations  SNF;Supervision/Assistance - 24 hour    Equipment Recommendations  3 in 1 bedside commode    Recommendations for Other Services       Precautions / Restrictions Precautions Precautions: Fall Precaution Comments: patient had fall 2 weeks ago, facial lacerations and bruising Restrictions Weight Bearing Restrictions: No      Mobility Bed Mobility Overal bed mobility: Needs Assistance Bed Mobility: Sit to Supine       Sit to supine: Min assist   General bed mobility comments: increased time and min assist to bring BLEs supine   Transfers Overall transfer level: Needs assistance Equipment used: Rolling walker (2 wheeled) Transfers: Sit to/from [facility] Sit to Stand: Min  assist Stand pivot transfers: Min assist       General transfer comment: min assist to power up and steady with cueing for hand placement and technique     Balance Overall balance assessment: Needs assistance Sitting-balance support: No upper extremity supported;Feet supported Sitting balance-Leahy Scale: Fair     Standing balance support: Bilateral upper extremity supported;During functional activity Standing balance-Leahy Scale: Fair Standing balance comment: relaint on BUE support                           ADL either performed or assessed with clinical judgement   ADL Overall ADL's : Needs assistance/impaired     Grooming: Set up;Sitting   Upper Body Bathing: Set up;Sitting   Lower Body Bathing: Minimal assistance;Sit to/from stand   Upper Body Dressing : Set up;Sitting   Lower Body Dressing: Moderate assistance;Sit to/from stand   Toilet Transfer: Minimal assistance;Stand-pivot;RW Armed forces technical officer Details (indicate cue type and reason): simulated from recliner          Functional mobility during ADLs: Minimal assistance;Rolling walker;Cueing for safety;Cueing for sequencing General ADL Comments: pt limited by weakness, decreased activity tolerance and cognition      Vision   Vision Assessment?: No apparent visual deficits     Perception     Praxis      Pertinent Vitals/Pain Pain Assessment: No/denies pain     Hand Dominance Right   Extremity/Trunk Assessment Upper Extremity Assessment Upper Extremity Assessment: Generalized weakness   Lower Extremity Assessment Lower Extremity Assessment: Generalized weakness       Communication Communication Communication: No difficulties   Cognition Arousal/Alertness: Lethargic Behavior During Therapy: WFL for tasks  assessed/performed Overall Cognitive Status: Impaired/Different from baseline Area of Impairment: Orientation;Following commands;Memory;Awareness;Problem solving;Safety/judgement                  Orientation Level: Disoriented to;Time(but able to correct himself after asked a 2nd time )   Memory: Decreased short-term memory Following Commands: Follows one step commands consistently;Follows one step commands with increased time;Follows multi-step commands inconsistently Safety/Judgement: Decreased awareness of safety;Decreased awareness of deficits Awareness: Emergent Problem Solving: Slow processing;Decreased initiation;Difficulty sequencing;Requires verbal cues General Comments: pt initally lethargic, improves with engagment and mobilization; patient reports year is 52 but able to correct himself when asked at completion of session; increased time for processing and problem sovling- antiicpate from lethargy    General Comments  VSS on RA     Exercises     Shoulder Instructions      Home Living Family/patient expects to be discharged to:: Private residence Living Arrangements: Alone Available Help at Discharge: Family Type of Home: Apartment       Home Layout: One level     Bathroom Shower/Tub: Occupational psychologist: Standard     Home Equipment: Shower seat          Prior Functioning/Environment Level of Independence: Independent        Comments: independent ADLs, IADLs, driving         OT Problem List: Decreased strength;Decreased activity tolerance;Impaired balance (sitting and/or standing);Decreased cognition;Decreased safety awareness;Decreased knowledge of use of DME or AE;Cardiopulmonary status limiting activity;Obesity;Increased edema      OT Treatment/Interventions: Self-care/ADL training;DME and/or AE instruction;Therapeutic exercise;Therapeutic activities;Patient/family education;Balance training;Cognitive remediation/compensation    OT Goals(Current goals can be found in the care plan section) Acute Rehab OT Goals Patient Stated Goal: agreeable to rehab prior to returning home, get stronger OT Goal Formulation:  With patient Time For Goal Achievement: 07/24/19 Potential to Achieve Goals: Good  OT Frequency: Min 2X/week   Barriers to D/C:            Co-evaluation              AM-PAC OT "6 Clicks" Daily Activity     Outcome Measure Help from another person eating meals?: A Little Help from another person taking care of personal grooming?: A Little Help from another person toileting, which includes using toliet, bedpan, or urinal?: A Lot Help from another person bathing (including washing, rinsing, drying)?: A Little Help from another person to put on and taking off regular upper body clothing?: A Little Help from another person to put on and taking off regular lower body clothing?: A Lot 6 Click Score: 16   End of Session Equipment Utilized During Treatment: Gait belt;Rolling walker Nurse Communication: Mobility status  Activity Tolerance: Patient limited by fatigue Patient left: in bed;with call bell/phone within reach;with bed alarm set  OT Visit Diagnosis: Other abnormalities of gait and mobility (R26.89);Muscle weakness (generalized) (M62.81);History of falling (Z91.81)                Time: [id number] OT Time Calculation (min): 16 min Charges:  OT General Charges $OT Visit: 1 Visit OT Evaluation $OT Eval Moderate Complexity: 1 Mod  Jolaine Artist, OT Acute Rehabilitation Services Pager [phone] Office [phone]    Delight Stare 07/10/2019, 1:27 PM

## 2019-07-10 NOTE — Consult Note (Addendum)
Cardiology Consultation:   Patient ID: Ricardo Payne MRN: [id number]; DOB: 09/25/1930  Admit date: 07/06/2019 Date of Consult: 07/10/2019  Primary Care Provider: [name], FNP Primary Cardiologist: Donato Heinz, MD  Primary Electrophysiologist:  Cristopher Peru, MD    Patient Profile:   Ricardo Payne is a 86 y.o. male with a hx of CAD status post CABG x1 in 1990, ischemic cardiomyopathy status post ICD 02/2018, atrial fibrillation, hypertension, hyperlipidemia, CVA 07/2016, CKD stage III who is being seen today for the evaluation of refractory heart failure and worsening kidney function at the request of [name], internal medicine teaching service.  History of Present Illness:   Ricardo Payne with seen most recently by Dr. Gardiner Rhyme June 13, 2019.  He was developing increasing shortness of breath since December 2020 despite increase in Bumex dose intensity.  This led to worsening of kidney function which improved with holding Bumex.  On the January 13 office visit the patient was doing relatively well.  His breathing had improved as had lower extremity edema.  Since that office visit, the patient has gradually increased lower extremity swelling and dyspnea starting around the beginning of February.  He was admitted to the hospital with worsening heart failure by Dr. Tarri Abernethy.  Diuresis in the hospital is led to worsening kidney function.  The patient reports compliance with his medication regimen but that urine output significantly decreased prior to admission.  Greater than a week ago he fell and had head trauma.  Anticoagulation therapy needed to be held because of a small subarachnoid bleed.  Heart Pathway Score:     Past Medical History:  Diagnosis Date  . Acquired thrombophilia ([place])   . Age-related cataract of both eyes   . ASHD (arteriosclerotic heart disease)   . Benign prostatic hyperplasia   . CAD (coronary artery disease)    a. s/p CABG x1 in  1990  . Cerebellar stroke ([place]) 07/02/2016  . Chronic combined systolic and diastolic CHF (congestive heart failure) ([place])   . Chronic gout without tophus   . CKD (chronic kidney disease), stage IV ([place])   . Essential hypertension   . ICD (implantable cardioverter-defibrillator) in place   . Ischemic cardiomyopathy   . Mixed hyperlipidemia   . PAF (paroxysmal atrial fibrillation) ([place])   . Restless legs   . Secondary pulmonary arterial hypertension ([place])   . Thrombocytopenia ([place])   . Vitamin D deficiency     Past Surgical History:  Procedure Laterality Date  . CHOLECYSTECTOMY    . CORONARY ARTERY BYPASS GRAFT    . PACEMAKER IMPLANT  10/2018     Home Medications:  Prior to Admission medications   Medication Sig Start Date End Date Taking? Authorizing Provider  allopurinol (ZYLOPRIM) 100 MG tablet Take 100 mg by mouth daily.   Yes [provider]  apixaban (ELIQUIS) 2.5 MG TABS tablet Take 2.5 mg by mouth 2 (two) times daily.   Yes [provider]  bumetanide (BUMEX) 1 MG tablet Take 1 tablet (1 mg total) by mouth daily. 05/24/19  Yes Dunn, Dayna N, PA-C  carvedilol (COREG) 6.25 MG tablet Take 3.125 mg by mouth 2 (two) times daily with a meal. 1/ 2  tablet (3.125mg ) twice daily   Yes [provider]  ergocalciferol (VITAMIN D2) 1.25 MG (50000 UT) capsule Take 50,000 Units by mouth once a week. Sunday   Yes [provider]  ferrous sulfate 325 (65 FE) MG tablet Take 325 mg by mouth daily  with breakfast.   Yes [provider]  Folic Acid-Vit Q000111Q [id number] (FOLBEE) 2.5-25-1 MG TABS tablet Take 1 tablet by mouth daily.   Yes [provider]  pramipexole (MIRAPEX) 0.25 MG tablet Take 0.25 mg by mouth 2 (two) times daily.   Yes [provider]  simvastatin (ZOCOR) 40 MG tablet Take 40 mg by mouth daily at 6 PM.   Yes [provider]  DUREZOL 0.05 % EMUL Place 1 drop into the right eye 2 (two) times daily. Until Wed, then  1 drop daily 05/06/19   [provider]  sulfamethoxazole-trimethoprim (BACTRIM DS) 800-160 MG tablet Take 1 tablet by mouth 2 (two) times daily. 06/28/19   [provider]    Inpatient Medications: Scheduled Meds: . apixaban  2.5 mg Oral BID  . Chlorhexidine Gluconate Cloth  6 each Topical Daily  . folic acid-pyridoxine-cyancobalamin  1 tablet Oral Daily  . mouth rinse  15 mL Mouth Rinse BID  . midodrine  10 mg Oral TID WC  . pramipexole  0.25 mg Oral BID  . simvastatin  40 mg Oral q1800  . thiamine  100 mg Oral Daily  . Vitamin D (Ergocalciferol)  50,000 Units Oral Q7 days   Continuous Infusions:  PRN Meds: acetaminophen **OR** acetaminophen, levalbuterol, ondansetron **OR** ondansetron (ZOFRAN) IV  Allergies:   No Known Allergies  Social History:   Social History   Socioeconomic History  . Marital status: Widowed    Spouse name: Not on file  . Number of children: 2  . Years of education: Not on file  . Highest education level: Not on file  Occupational History  . Not on file  Tobacco Use  . Smoking status: Former Smoker    Quit date: 1988    Years since quitting: 33.1  . Smokeless tobacco: Never Used  Substance and Sexual Activity  . Alcohol use: Yes    Comment: rare  . Drug use: Never  . Sexual activity: Not on file  Other Topics Concern  . Not on file  Social History Narrative  . Not on file   Social Determinants of Health   Financial Resource Strain:   . Difficulty of Paying Living Expenses: Not on file  Food Insecurity:   . Worried About Charity fundraiser in the Last Year: Not on file  . Ran Out of Food in the Last Year: Not on file  Transportation Needs:   . Lack of Transportation (Medical): Not on file  . Lack of Transportation (Non-Medical): Not on file  Physical Activity:   . Days of Exercise per Week: Not on file  . Minutes of Exercise per Session: Not on file  Stress:   . Feeling of Stress : Not on file  Social  Connections:   . Frequency of Communication with Friends and Family: Not on file  . Frequency of Social Gatherings with Friends and Family: Not on file  . Attends Religious Services: Not on file  . Active Member of Clubs or Organizations: Not on file  . Attends Archivist Meetings: Not on file  . Marital Status: Not on file  Intimate Partner Violence:   . Fear of Current or Ex-Partner: Not on file  . Emotionally Abused: Not on file  . Physically Abused: Not on file  . Sexually Abused: Not on file    Family History:    Family History  Problem Relation Age of Onset  . Hypertension Mother   . Suicidality Mother   .  Valvular heart disease Father   . Heart disease Father   . Heart attack Brother      ROS:  Please see the history of present illness.  Recent fall with head trauma and small subarachnoid hemorrhage.  Appetite has been poor.  He denies chest pain.  He endorses medication compliance.  He lives alone. All other ROS reviewed and negative.     Physical Exam/Data:   Vitals:   07/09/19 2310 07/10/19 0310 07/10/19 0500 07/10/19 0730  BP:    124/61  Pulse:    77  Resp:    (!) 23  Temp: 98 F (36.7 C) 98.3 F (36.8 C)  98.3 F (36.8 C)  TempSrc: Oral Oral  Oral  SpO2:    93%  Weight:   103 kg   Height:        Intake/Output Summary (Last 24 hours) at 07/10/2019 [id number] Last data filed at 07/09/2019 2224 Gross per 24 hour  Intake --  Output 750 ml  Net -750 ml   Last 3 Weights 07/10/2019 07/07/2019 07/06/2019  Weight (lbs) 227 lb 1.2 oz 222 lb 10.6 oz 223 lb 11.2 oz  Weight (kg) 103 kg 101 kg 101.47 kg     Body mass index is 34.53 kg/m.  General:  Well nourished, well developed, frail/chronically ill-appearing HEENT: normal Lymph: no adenopathy Neck: Moderate JVD, while lying in bed at 45 degrees preparing to eat breakfast. Endocrine:  No thryomegaly Vascular: No carotid bruits; FA pulses 2+ bilaterally without bruits  Cardiac:  normal S1, S2; RRR; but there  is an apical soft systolic murmur that is heard faintly into the left axilla. Lungs:  clear to auscultation bilaterally, no wheezing, rhonchi or rales  Abd: soft, nontender, no hepatomegaly  Ext: Bilateral lower extremity edema. Skin: warm and dry  Neuro:  CNs 2-12 intact, no focal abnormalities noted Psych:  Normal affect   EKG:  The EKG was personally reviewed and demonstrates: Sinus rhythm with left axis deviation and interventricular conduction delay when performed on 07/07/2019.  When compared to the tracing performed on the office visit May 18, 2019, no significant changes noted.  A more typical left bundle branch block pattern is noted with sinus rhythm at that time. Telemetry:  Telemetry was personally reviewed and demonstrates: Sinus rhythm, occasional PVCs, no A. fib/a flutter.  Relevant CV Studies: 2D Doppler echocardiogram 07/08/2018: IMPRESSIONS    1. Left ventricular ejection fraction, by estimation, is <20%. The left  ventricle has severely decreased function. The left ventrical demonstrates  global hypokinesis. The left ventricular internal cavity size was mildly  dilated. Left ventricular  diastolic parameters are indeterminate.  2. Right ventricular systolic function is moderately reduced. The right  ventricular size is mildly enlarged. There is moderately elevated  pulmonary artery systolic pressure. The estimated right ventricular  systolic pressure is [id number] mmHg.  3. Mild to moderate mitral valve regurgitation.  4. Tricuspid valve regurgitation is severe.  5. The aortic valve is tricuspid. Mild aortic valve stenosis, mean  gradient only 6 mmHg but visually at least mild AS. No aortic  insufficiency.  6. Aortic dilatation noted. There is mild dilatation of the aortic root  measuring 41 mm.  7. Dilated IVC suggesting RA pressure 15 mmHg.  8. Rhythm difficult to discern, may be atrial fibrillation.   Laboratory Data:  High Sensitivity Troponin:   Recent  Labs  Lab 07/06/19 0709 07/06/19 0902  TROPONINIHS <2 <2     Chemistry Recent Labs  Lab 07/08/19  1845 07/09/19 0255 07/10/19 0157  NA 136 136 135  K 4.0 4.0 4.1  CL 99 94* 96*  CO2 23 25 25   GLUCOSE 162* 126* 117*  BUN 118* 119* 120*  CREATININE 4.42* 4.62* 4.30*  CALCIUM 8.3* 8.3* 8.5*  GFRNONAA 11* 11* 12*  GFRAA 13* 12* 13*  ANIONGAP 14 17* 14    Recent Labs  Lab 07/07/19 1438 07/07/19 1438 07/08/19 0714 07/09/19 0255 07/10/19 0157  PROT 5.9*  --  6.2* 5.3*  --   ALBUMIN 3.2*   < > 3.1* 2.8* 3.0*  AST 113*  --  160* 154*  --   ALT 61*  --  85* 96*  --   ALKPHOS 95  --  94 103  --   BILITOT 4.6*  --  4.6* 3.7*  --    < > = values in this interval not displayed.   Hematology Recent Labs  Lab 07/08/19 0714 07/09/19 0255 07/10/19 0157  WBC 8.7 5.5 7.4  RBC 3.88* 3.20* 3.39*  HGB 11.8* 9.6* 10.3*  HCT 35.8* 29.4* 31.3*  MCV 92.3 91.9 92.3  MCH 30.4 30.0 30.4  MCHC 33.0 32.7 32.9  RDW 16.1* 15.8* 15.7*  PLT 52* 42* 56*   BNP Recent Labs  Lab 07/06/19 0628  BNP 2,666.4*    DDimer No results for input(s): DDIMER in the last 168 hours.   Radiology/Studies:  US Abdomen Complete  Result Date: 07/07/2019 CLINICAL DATA:  Cirrhosis EXAM: ABDOMEN ULTRASOUND COMPLETE COMPARISON:  None. FINDINGS: Gallbladder: Prior cholecystectomy Common bile duct: Diameter: Normal caliber, 5 mm Liver: No focal lesion identified. Within normal limits in parenchymal echogenicity. Portal vein is patent on color Doppler imaging with normal direction of blood flow towards the liver. IVC: No abnormality visualized. Pancreas: Not visualized due to overlying bowel gas. Spleen: Upper limits normal in size at 12.5 cm craniocaudal length and 420 mL volume. Right Kidney: Length: 12.0 cm. Small solid exophytic nodule off the upper pole measures 2.5 cm. Cortical thinning. Normal echotexture. No hydronephrosis. Left Kidney: Length: 10.3 cm. Cortical thinning. No hydronephrosis. Normal  echotexture. Abdominal aorta: No aneurysm visualized. Other findings: None. IMPRESSION: No focal hepatic abnormality. Borderline splenic size. 2.5 cm exophytic solid lesion off the upper pole the right kidney. When the patient is clinically stable and able to follow directions and hold their breath (preferably as an outpatient) further evaluation with dedicated abdominal MRI should be considered. Electronically Signed   By: Rolm Baptise M.D.   On: 07/07/2019 15:16   US RENAL  Result Date: 07/09/2019 CLINICAL DATA:  Inpatient.  Acute kidney injury. EXAM: RENAL / URINARY TRACT ULTRASOUND COMPLETE COMPARISON:  07/07/2019 abdominal sonogram. FINDINGS: Right Kidney: Renal measurements: 11.4 x 4.4 x 5.1 cm = volume: 133 mL. Echogenic right renal parenchyma, normal thickness. No hydronephrosis. Simple 3.7 x 2.6 x 3.2 cm interpolar right renal cyst. Separate simple 2.9 x 2.4 x 2.7 cm interpolar right renal cyst. Left Kidney: Renal measurements: 11.6 x 4.6 x 4.3 cm = volume: 119 mL. Echogenic left renal parenchyma, normal thickness. No hydronephrosis. No renal masses. Bladder: Appears normal for degree of bladder distention. Ureteral jets demonstrated bilaterally in the bladder. Other: Perihepatic ascites. IMPRESSION: 1. No hydronephrosis. 2. Echogenic normal size kidneys, compatible with reported history of nonspecific acute renal parenchymal disease. 3. Simple right renal cyst. 4. Normal bladder. 5. Incidental perihepatic ascites. Electronically Signed   By: Ilona Sorrel M.D.   On: 07/09/2019 10:39   CT Chest High Resolution  Result  Date: 07/07/2019 CLINICAL DATA:  Chronic shortness of breath, hypoxemia EXAM: CT CHEST WITHOUT CONTRAST TECHNIQUE: Multidetector CT imaging of the chest was performed following the standard protocol without intravenous contrast. High resolution imaging of the lungs, as well as inspiratory and expiratory imaging, was performed. COMPARISON:  Chest radiographs, 07/06/2019 FINDINGS:  Cardiovascular: Aortic atherosclerosis. Cardiomegaly. Three-vessel coronary artery calcifications and/or stents status post median sternotomy and CABG. No pericardial effusion. Mediastinum/Nodes: No enlarged mediastinal, hilar, or axillary lymph nodes. Thyroid gland, trachea, and esophagus demonstrate no significant findings. Lungs/Pleura: Minimal dependent bibasilar atelectasis. Minimal bandlike scarring of the apical left upper lobe. Trace bilateral pleural effusions. Upper Abdomen: No acute abnormality. Small volume perihepatic ascites. Cirrhotic morphology of the liver. Status post cholecystectomy. Musculoskeletal: No chest wall mass or suspicious bone lesions identified. IMPRESSION: 1. Minimal bibasilar atelectasis. Trace bilateral pleural effusions. No evidence of fibrotic interstitial lung disease or acute appearing airspace disease. 2. Cardiomegaly. Three-vessel coronary artery calcifications and/or stents status post median sternotomy and CABG. 3. Cirrhotic morphology of the liver in the included upper abdomen. Small volume perihepatic ascites. 4. Aortic Atherosclerosis (ICD10-I70.0). Electronically Signed   By: Eddie Candle M.D.   On: 07/07/2019 11:19   ECHOCARDIOGRAM COMPLETE  Result Date: 07/09/2019    ECHOCARDIOGRAM REPORT   Patient Name:   Ricardo Payne Date of Exam: 07/09/2019 Medical Rec #:  [id number]               Height:       68.0 in Accession #:    [id number]              Weight:       222.7 lb Date of Birth:  09-18-1930                BSA:          2.14 m Patient Age:    71 years                BP:           85/52 mmHg Patient Gender: M                       HR:           65 bpm. Exam Location:  Inpatient Procedure: 2D Echo, Cardiac Doppler and Color Doppler Indications:    I50.23 Acute on chronic systolic (congestive) heart failure  History:        Patient has prior history of Echocardiogram examinations, most                 recent 04/02/2019. Cardiomyopathy, CAD, Defibrillator,  Stroke and                 Pulmonary HTN, Arrythmias:Atrial Fibrillation; Risk                 Factors:Hypertension and Dyslipidemia. CKD.  Sonographer:    Jonelle Sidle Dance Referring Phys: [id number] [place]  1. Left ventricular ejection fraction, by estimation, is <20%. The left ventricle has severely decreased function. The left ventrical demonstrates global hypokinesis. The left ventricular internal cavity size was mildly dilated. Left ventricular diastolic parameters are indeterminate.  2. Right ventricular systolic function is moderately reduced. The right ventricular size is mildly enlarged. There is moderately elevated pulmonary artery systolic pressure. The estimated right ventricular systolic pressure is [id number] mmHg.  3. Mild to moderate mitral valve regurgitation.  4. Tricuspid valve regurgitation is severe.  5. The  aortic valve is tricuspid. Mild aortic valve stenosis, mean gradient only 6 mmHg but visually at least mild AS. No aortic insufficiency.  6. Aortic dilatation noted. There is mild dilatation of the aortic root measuring 41 mm.  7. Dilated IVC suggesting RA pressure 15 mmHg.  8. Rhythm difficult to discern, may be atrial fibrillation. FINDINGS  Left Ventricle: Left ventricular ejection fraction, by estimation, is <20%. The left ventricle has severely decreased function. The left ventricle demonstrates global hypokinesis. The left ventricular internal cavity size was mildly dilated. There is no  left ventricular hypertrophy. Right Ventricle: The right ventricular size is mildly enlarged. No increase in right ventricular wall thickness. Right ventricular systolic function is moderately reduced. There is moderately elevated pulmonary artery systolic pressure. The tricuspid regurgitant velocity is 2.82 m/s, and with an assumed right atrial pressure of 15 mmHg, the estimated right ventricular systolic pressure is [id number] mmHg. Left Atrium: Left atrial size was moderately dilated. Right  Atrium: Right atrial size was moderately dilated. Pericardium: Trivial pericardial effusion is present. Mitral Valve: The mitral valve is normal in structure and function. There is mild calcification of the mitral valve leaflet(s). Moderate mitral annular calcification. Mild to moderate mitral valve regurgitation. Tricuspid Valve: The tricuspid valve is normal in structure. Tricuspid valve regurgitation is severe. Aortic Valve: The aortic valve is tricuspid. Aortic valve regurgitation is not visualized. Aortic regurgitation PHT measures 754 msec. Mild aortic stenosis is present. Aortic valve mean gradient measures 6.0 mmHg. Aortic valve peak gradient measures 11.0  mmHg. Aortic valve area, by VTI measures 1.16 cm. Pulmonic Valve: The pulmonic valve was normal in structure. Pulmonic valve regurgitation is trivial. Aorta: Aortic dilatation noted. There is mild dilatation of the aortic root measuring 41 mm. Venous: The inferior vena cava is dilated in size with less than 50% respiratory variability, suggesting right atrial pressure of 15 mmHg. IAS/Shunts: No atrial level shunt detected by color flow Doppler. Additional Comments: A pacer wire is visualized in the right ventricle.  LEFT VENTRICLE PLAX 2D LVIDd:         6.30 cm LVIDs:         5.50 cm LV PW:         1.20 cm LV IVS:        1.00 cm LVOT diam:     2.40 cm LV SV:         36.58 ml LV SV Index:   24.03 LVOT Area:     4.52 cm  RIGHT VENTRICLE            IVC RV Basal diam:  3.70 cm    IVC diam: 2.60 cm RV Mid diam:    3.10 cm RV S prime:     4.22 cm/s TAPSE (M-mode): 1.7 cm LEFT ATRIUM              Index       RIGHT ATRIUM           Index LA diam:        5.70 cm  2.66 cm/m  RA Area:     26.90 cm LA Vol (A2C):   106.0 ml 49.55 ml/m RA Volume:   101.00 ml 47.22 ml/m LA Vol (A4C):   102.0 ml 47.68 ml/m LA Biplane Vol: 105.0 ml 49.09 ml/m  AORTIC VALVE AV Area (Vmax):    1.27 cm AV Area (Vmean):   1.29 cm AV Area (VTI):     1.16 cm AV Vmax:  165.50  cm/s AV Vmean:          116.000 cm/s AV VTI:            0.314 m AV Peak Grad:      11.0 mmHg AV Mean Grad:      6.0 mmHg LVOT Vmax:         46.60 cm/s LVOT Vmean:        33.000 cm/s LVOT VTI:          0.081 m LVOT/AV VTI ratio: 0.26 AI PHT:            754 msec  AORTA Ao Root diam: 4.10 cm Ao Asc diam:  3.50 cm MITRAL VALVE                       TRICUSPID VALVE MV Area (PHT): 2.66 cm            TR Peak grad:   31.8 mmHg MV Decel Time: 286 msec            TR Vmax:        282.00 cm/s MV E velocity: 76.15 cm/s 103 cm/s                                    SHUNTS                                    Systemic VTI:  0.08 m                                    Systemic Diam: 2.40 cm Loralie Champagne MD Electronically signed by Loralie Champagne MD Signature Date/Time: 07/09/2019/6:01:16 PM    Final    VAS [place] LOWER EXTREMITY VENOUS (DVT) (ONLY [facility] & [facility])  Result Date: 07/08/2019  Lower Venous DVTStudy Indications: Edema.  Limitations: Body habitus and poor ultrasound/tissue interface. Comparison Study: no prior Performing Technologist: Abram Sander RVS  Examination Guidelines: A complete evaluation includes B-mode imaging, spectral Doppler, color Doppler, and power Doppler as needed of all accessible portions of each vessel. Bilateral testing is considered an integral part of a complete examination. Limited examinations for reoccurring indications may be performed as noted. The reflux portion of the exam is performed with the patient in reverse Trendelenburg.  +---------+---------------+---------+-----------+----------+--------------+ RIGHT    CompressibilityPhasicitySpontaneityPropertiesThrombus Aging +---------+---------------+---------+-----------+----------+--------------+ CFV      Full           Yes      Yes                                 +---------+---------------+---------+-----------+----------+--------------+ SFJ      Full                                                         +---------+---------------+---------+-----------+----------+--------------+ FV Prox  Full                                                        +---------+---------------+---------+-----------+----------+--------------+  FV Mid   Full                                                        +---------+---------------+---------+-----------+----------+--------------+ FV DistalFull                                                        +---------+---------------+---------+-----------+----------+--------------+ PFV      Full                                                        +---------+---------------+---------+-----------+----------+--------------+ POP      Full           Yes      Yes                                 +---------+---------------+---------+-----------+----------+--------------+ PTV      Full                                                        +---------+---------------+---------+-----------+----------+--------------+ PERO                                                  Not visualized +---------+---------------+---------+-----------+----------+--------------+   +---------+---------------+---------+-----------+----------+--------------+ LEFT     CompressibilityPhasicitySpontaneityPropertiesThrombus Aging +---------+---------------+---------+-----------+----------+--------------+ CFV      Full           Yes      Yes                                 +---------+---------------+---------+-----------+----------+--------------+ SFJ      Full                                                        +---------+---------------+---------+-----------+----------+--------------+ FV Prox  Full                                                        +---------+---------------+---------+-----------+----------+--------------+ FV Mid   Full                                                         +---------+---------------+---------+-----------+----------+--------------+  FV DistalFull                                                        +---------+---------------+---------+-----------+----------+--------------+ PFV      Full                                                        +---------+---------------+---------+-----------+----------+--------------+ POP      Full           Yes      Yes                                 +---------+---------------+---------+-----------+----------+--------------+ PTV                                                   Not visualized +---------+---------------+---------+-----------+----------+--------------+ PERO                                                  Not visualized +---------+---------------+---------+-----------+----------+--------------+     Summary: BILATERAL: - No evidence of deep vein thrombosis seen in the lower extremities, bilaterally.   *See table(s) above for measurements and observations. Electronically signed by Ruta Hinds MD on 07/08/2019 at 11:32:13 AM.    Final          Assessment and Plan:   1. Acute on chronic combined systolic and diastolic heart failure: Significant drop in ejection fraction since the prior study in November 2020 when EF was 40%.  Etiology for the precipitous drop is uncertain.  Could be related to Cardiorenal Syndrome.  We will have his device interrogated to rule out the possibility of high burden of right ventricular pacing which could cause LV systolic dysfunction.  However, at his age with severe CKD, resynchronization therapy is not practical.  Diuretics are currently on hold per nephrology.  He is not a reasonable candidate for advanced heart failure therapies. 2. Acute on chronic stage IV chronic kidney disease: There is likely cardiac and renal interplay with hemodynamic and neuro humeral interaction causing significant decline in kidney function. 3. Single-chamber ICD: Need  to determine if there is a high burden of single-chamber pacing. 4. Paroxysmal atrial fibrillation: Anticoagulation has been recently held due to a fall with subdural hematoma. 5. Goals of care: Considering age, renal dysfunction, severe systolic heart failure, it is appropriate to determine goals of care and a palliative care consultation is recommended.  Overall, this is a difficult situation with a very poor prognosis.  There is not much more to add.  Will discuss with electrophysiology.  If he decides to be "full court press", perhaps inotropic support would transiently improve his situation.  There is no long-term fix for his underlying condition and ultimate poor prognosis.  For questions or updates, please contact [place] Please consult [email or web address] for contact info under  Signed, Sinclair Grooms, MD  07/10/2019 9:22 AM

## 2019-07-11 DIAGNOSIS — B370 Candidal stomatitis: Secondary | ICD-10-CM

## 2019-07-11 LAB — RENAL FUNCTION PANEL
Albumin: 2.9 g/dL — ABNORMAL LOW (ref 3.5–5.0)
Anion gap: 11 (ref 5–15)
BUN: 110 mg/dL — ABNORMAL HIGH (ref 8–23)
CO2: 24 mmol/L (ref 22–32)
Calcium: 8.7 mg/dL — ABNORMAL LOW (ref 8.9–10.3)
Chloride: 100 mmol/L (ref 98–111)
Creatinine, Ser: 3.65 mg/dL — ABNORMAL HIGH (ref 0.61–1.24)
GFR calc Af Amer: 16 mL/min — ABNORMAL LOW (ref >60)
GFR calc non Af Amer: 14 mL/min — ABNORMAL LOW (ref >60)
Glucose, Bld: 109 mg/dL — ABNORMAL HIGH (ref 70–99)
Phosphorus: 2.8 mg/dL (ref 2.5–4.6)
Potassium: 4.2 mmol/L (ref 3.5–5.1)
Sodium: 135 mmol/L (ref 135–145)

## 2019-07-11 LAB — CBC
HCT: 32.2 % — ABNORMAL LOW (ref 39.0–52.0)
Hemoglobin: 10.8 g/dL — ABNORMAL LOW (ref 13.0–17.0)
MCH: 30.1 pg (ref 26.0–34.0)
MCHC: 33.5 g/dL (ref 30.0–36.0)
MCV: 89.7 fL (ref 80.0–100.0)
Platelets: 63 10*3/uL — ABNORMAL LOW (ref 150–400)
RBC: 3.59 MIL/uL — ABNORMAL LOW (ref 4.22–5.81)
RDW: 15.6 % — ABNORMAL HIGH (ref 11.5–15.5)
WBC: 9.1 10*3/uL (ref 4.0–10.5)
nRBC: 0 % (ref 0.0–0.2)

## 2019-07-11 LAB — HIV ANTIBODY (ROUTINE TESTING W REFLEX): HIV Screen 4th Generation wRfx: NONREACTIVE

## 2019-07-11 MED ORDER — SODIUM CHLORIDE 0.9 % IV SOLN
510.0000 mg | INTRAVENOUS | Status: DC
  Administered 2019-07-11: 510 mg via INTRAVENOUS
  Filled 2019-07-11: qty 17

## 2019-07-11 NOTE — Progress Notes (Signed)
Daily Progress Note   Patient Name: Ricardo Payne       Date: 07/11/2019 DOB: 1930-08-14  Age: 86 y.o. MRN#: [id number] Attending Physician: Axel Filler, * Primary Care Physician: [name], FNP Admit Date: 07/06/2019  Reason for Consultation/Follow-up: Establishing goals of care  Subjective: Patient awake, alert, oriented and sitting up in recliner this afternoon. Feeling better than yesterday without dyspnea at rest. Denies pain. Able to participate in [place] discussion.   GOC:  Son, Dwayne at bedside. Dr. Benjamine Mola came to bedside during discussion.   Introduced role of palliative medicine. Discussed baseline prior to admit and events leading up to admission.   Discussed course of hospitalization including diagnoses, interventions, plan of care. Discussed chronic, irreversible nature of his heart condition and high risk for re-admission for recurrent heart failure exacerbation and underlying CKD.   Patient does not have a documented living will and has not previously discussed his wishes with son. Recommended against life-prolonging interventions with not only age/frailty but also underlying irreversible conditions and these prolonging interventions likely not impacting his quality of life as his heart condition worsens.   Introduced and discussed AD packet and MOST form. Encouraged discussions with his son, especially while cognizant to make decisions for himself. Discussed 'hoping for the best but also preparing for the worst' and that we would wish to honor his wishes, whatever they may be.   No decisions made today but again encouraged ongoing discussions about his goals and wishes. Hard Choices booklet given to son. They are agreeable with outpatient palliative referral.    Plan is for SNF rehab. Patient/son hopeful to return home following rehab stay.   Answered questions and concerns. PMT contact information given.    Length of Stay: 5  Current Medications: Scheduled Meds:  . apixaban  2.5 mg Oral BID  . Chlorhexidine Gluconate Cloth  6 each Topical Daily  . clotrimazole  10 mg Oral 5 X Daily  . folic acid-pyridoxine-cyancobalamin  1 tablet Oral Daily  . mouth rinse  15 mL Mouth Rinse BID  . midodrine  10 mg Oral TID WC  . pramipexole  0.25 mg Oral BID  . simvastatin  40 mg Oral q1800  . thiamine  100 mg Oral Daily  . Vitamin D (Ergocalciferol)  50,000 Units Oral Q7 days  Continuous Infusions: . ferumoxytol 510 mg (07/11/19 1108)    PRN Meds: acetaminophen **OR** acetaminophen, levalbuterol, ondansetron **OR** ondansetron (ZOFRAN) IV  Physical Exam Vitals and nursing note reviewed.  Constitutional:      General: He is awake.  HENT:     Head: Normocephalic and atraumatic.  Cardiovascular:     Rate and Rhythm: Normal rate.  Pulmonary:     Effort: No tachypnea, accessory muscle usage or respiratory distress.  Skin:    General: Skin is warm and dry.  Neurological:     Mental Status: He is alert and oriented to person, place, and time.  Psychiatric:        Mood and Affect: Mood normal.        Speech: Speech normal.        Behavior: Behavior normal.        Cognition and Memory: Cognition normal.            Vital Signs: BP 129/69 (BP Location: Right Arm)   Pulse 83   Temp 98.1 F (36.7 C) (Oral)   Resp 20   Ht 5\' 8"  (1.727 m)   Wt 103 kg   SpO2 96%   BMI 34.53 kg/m  SpO2: SpO2: 96 % O2 Device: O2 Device: Nasal Cannula O2 Flow Rate: O2 Flow Rate (L/min): 3 L/min  Intake/output summary:   Intake/Output Summary (Last 24 hours) at 07/11/2019 1258 Last data filed at 07/11/2019 0800 Gross per 24 hour  Intake 680 ml  Output 0 ml  Net 680 ml   LBM: Last BM Date: 07/03/19 Baseline Weight: Weight: 101.5 kg Most recent  weight: Weight: 103 kg       Palliative Assessment/Data: PPS 50%      Patient Active Problem List   Diagnosis Date Noted  . Palliative care by specialist   . Goals of care, counseling/discussion   . Cirrhosis ([place])   . CKD (chronic kidney disease) 06/25/2019  . Head trauma 06/25/2019  . Blood thinned due to long-term anticoagulant use 06/25/2019  . Anemia 06/25/2019  . Acute kidney injury superimposed on chronic kidney disease ([place]) 05/23/2019  . Chronic combined systolic and diastolic CHF (congestive heart failure) ([place]) 05/23/2019  . Esophageal hypertension 05/23/2019  . Thrombocytopenia ([place]) 05/23/2019  . Acute on chronic HFrEF (heart failure with reduced ejection fraction) ([place]) 05/22/2019  . ICD (implantable cardioverter-defibrillator) in place 04/18/2019    Palliative Care Assessment & Plan   Patient Profile: 86 y.o. male  with past medical history of CAD s/p CABG in 1990, ischemic cardiomyopathy, ICD placement 2019, atrial fibrillation (anticoagulation on hold due to recent fall and small subarachnoid bleed), heart failure, hypertension, hyperlipidemia, CVA 2018, CKD stage III admitted on 07/06/2019 with shortness of breath and lower extremity edema. Patient initially severely fluid overloaded and was aggressively diuresed. Patient with acute on chronic heart failure with EF <20% (severely reduced from 40-45% in November 2020) now with AKI with baseline CKD stage III, likely due to cardiorenal syndrome. Cardiology and nephrology following. Per cardiology, patient is not a reasonable candidate for advanced heart failure therapies, perhaps inotropic support could transiently improve clinical condition but with irreversible heart condition and poor prognosis. Palliative medicine consultation for goals of care.   Assessment: Decompensated CHF Volume overload AKI on CKD stage IIIb Cirrhosis Anemia of chronic disease Hypertension  Recommendations/Plan:  FULL code/FULL  scope  Discussed AD packet and MOST form. No decisions made today. Encouraged ongoing discussions of his goals and wishes, especially since he is cognizant  to make decisions for himself.   Plan is for SNF rehab.  Outpatient palliative referral for ongoing [place] discussions.   Code Status: FULL   Code Status Orders  (From admission, onward)         Start     Ordered   07/06/19 0829  Full code  Continuous     07/06/19 0830        Code Status History    Date Active Date Inactive Code Status Order ID Comments User Context   05/22/2019 1141 05/23/2019 1722 Full Code [id number]  Leanor Kail, PA ED   Advance Care Planning Activity       Prognosis:  Poor long-term prognosis  Discharge Planning:  [place] for rehab with Palliative care service follow-up  Care plan was discussed with patient, son, Dr. Benjamine Mola, RN  Thank you for allowing the Palliative Medicine Team to assist in the care of this patient.   Time In: 1500 Time Out: 1540 Total Time 40 Prolonged Time Billed no      Greater than 50%  of this time was spent counseling and coordinating care related to the above assessment and plan.  Ihor Dow, DNP, FNP-C Palliative Medicine Team  Phone: [phone] Fax: [phone]  Please contact Palliative Medicine Team phone at [phone] for questions and concerns.

## 2019-07-11 NOTE — TOC Initial Note (Addendum)
Transition of Care [facility]) - Initial/Assessment Note    Patient Details  Name: Ricardo Payne MRN: [id number] Date of Birth: [birth year]  [facility]) CM/SW Contact:    Carles Collet, RN Phone Number: 07/11/2019, 9:06 AM  Clinical Narrative:             Damaris Schooner w patient at bedside and son Dwayne. Both confirm they are interested in SNF placement. PASRR obtained. Sent through [place] to local SNFs. Once offers are made will discuss these with patient/ son.  1:30 Discussed bed offers with the patient and son and they want to pursue [facility]. I have LVM w Tammy, admissions liaison to inquire about bed availability.  14:20 Spoke w Tammy, she will reach out to the patient's son.       Expected Discharge Plan: Skilled Nursing Facility Barriers to Discharge: Continued Medical Work up   Patient Goals and CMS Choice Patient states their goals for this hospitalization and ongoing recovery are:: to go to SNF prior to returning home      Expected Discharge Plan and Services Expected Discharge Plan: [place]   Discharge Planning Services: CM Consult Post Acute Care Choice: [place] Living arrangements for the past 2 months: Apartment                                      Prior Living Arrangements/Services Living arrangements for the past 2 months: Apartment Lives with:: Self Patient language and need for interpreter reviewed:: Yes Do you feel safe going back to the place where you live?: Yes      Need for Family Participation in Patient Care: Yes (Comment) Care giver support system in place?: Yes (comment)   Criminal Activity/Legal Involvement Pertinent to Current Situation/Hospitalization: No - Comment as needed  Activities of Daily Living      Permission Sought/Granted Permission sought to share information with : Case Manager Permission granted to share information with : Yes, Verbal Permission Granted     Permission  granted to share info w AGENCY: to send out to any local SNFs for placement     Permission granted to share info w Contact Information: Karma Greaser, son, [phone]  Emotional Assessment Appearance:: Appears stated age Attitude/Demeanor/Rapport: Engaged Affect (typically observed): Appropriate Orientation: : Oriented to Self, Oriented to Place, Oriented to  Time, Oriented to Situation   Psych Involvement: No (comment)  Admission diagnosis:  Shortness of breath [R06.02] Hypoxia [R09.02] Acute respiratory failure with hypoxia (HCC) [J96.01] Edema, unspecified type [R60.9] Patient Active Problem List   Diagnosis Date Noted  . Palliative care by specialist   . Goals of care, counseling/discussion   . Cirrhosis ([place])   . CKD (chronic kidney disease) 06/25/2019  . Head trauma 06/25/2019  . Blood thinned due to long-term anticoagulant use 06/25/2019  . Anemia 06/25/2019  . Acute kidney injury superimposed on chronic kidney disease ([place]) 05/23/2019  . Chronic combined systolic and diastolic CHF (congestive heart failure) ([place]) 05/23/2019  . Esophageal hypertension 05/23/2019  . Thrombocytopenia ([place]) 05/23/2019  . Acute on chronic HFrEF (heart failure with reduced ejection fraction) ([place]) 05/22/2019  . ICD (implantable cardioverter-defibrillator) in place 04/18/2019   PCP:  [name], FNP Pharmacy:   [id number] - , [place] [address] Phone: [phone] Fax: [phone]     Social Determinants of Health (SDOH) Interventions    Readmission  Risk Interventions No flowsheet data found.

## 2019-07-11 NOTE — Progress Notes (Addendum)
Progress Note  Patient Name: Ricardo Payne Date of Encounter: 07/11/2019  Primary Cardiologist: Donato Heinz, MD   Subjective   Patient says he feels better today. No chest pain. Patient appears mildly SOB during exam.   Inpatient Medications    Scheduled Meds: . apixaban  2.5 mg Oral BID  . Chlorhexidine Gluconate Cloth  6 each Topical Daily  . clotrimazole  10 mg Oral 5 X Daily  . folic acid-pyridoxine-cyancobalamin  1 tablet Oral Daily  . mouth rinse  15 mL Mouth Rinse BID  . midodrine  10 mg Oral TID WC  . pramipexole  0.25 mg Oral BID  . simvastatin  40 mg Oral q1800  . thiamine  100 mg Oral Daily  . Vitamin D (Ergocalciferol)  50,000 Units Oral Q7 days   Continuous Infusions: . ferumoxytol     PRN Meds: acetaminophen **OR** acetaminophen, levalbuterol, ondansetron **OR** ondansetron (ZOFRAN) IV   Vital Signs    Vitals:   07/10/19 2330 07/11/19 0300 07/11/19 0714 07/11/19 0841  BP:   (!) 146/80   Pulse:   88 87  Resp:   (!) 28 (!) 28  Temp: 98.2 F (36.8 C) 98.3 F (36.8 C) 97.9 F (36.6 C)   TempSrc: Oral Oral Oral   SpO2:   99% 96%  Weight:      Height:        Intake/Output Summary (Last 24 hours) at 07/11/2019 0956 Last data filed at 07/11/2019 0800 Gross per 24 hour  Intake 1040 ml  Output 425 ml  Net 615 ml   Last 3 Weights 07/10/2019 07/07/2019 07/06/2019  Weight (lbs) 227 lb 1.2 oz 222 lb 10.6 oz 223 lb 11.2 oz  Weight (kg) 103 kg 101 kg 101.47 kg      Telemetry    NSR, HR 70-80, frequent PVCs, 3 beats NSVT  - Personally Reviewed  ECG    No new - Personally Reviewed  Physical Exam   GEN: No acute distress.   Neck: moderate JVD Cardiac: RRR, no murmurs, rubs, or gallops.  Respiratory: minimal wheezing bilaterally. GI: Soft, nontender, non-distended  MS: No edema; No deformity. Neuro:  Nonfocal  Psych: Normal affect   Labs    High Sensitivity Troponin:   Recent Labs  Lab 07/06/19 0709 07/06/19 0902  TROPONINIHS  <2 <2      Chemistry Recent Labs  Lab 07/07/19 1438 07/07/19 1438 07/08/19 0714 07/08/19 1845 07/09/19 0255 07/10/19 0157 07/11/19 0208  NA 135   < > 136   < > 136 135 135  K 3.6   < > 4.7   < > 4.0 4.1 4.2  CL 101   < > 97*   < > 94* 96* 100  CO2 22   < > 23   < > 25 25 24   GLUCOSE 120*   < > 117*   < > 126* 117* 109*  BUN 104*   < > 115*   < > 119* 120* 110*  CREATININE 4.69*   < > 4.52*   < > 4.62* 4.30* 3.65*  CALCIUM 8.2*   < > 8.3*   < > 8.3* 8.5* 8.7*  PROT 5.9*  --  6.2*  --  5.3*  --   --   ALBUMIN 3.2*   < > 3.1*  --  2.8* 3.0* 2.9*  AST 113*  --  160*  --  154*  --   --   ALT 61*  --  85*  --  96*  --   --   ALKPHOS 95  --  94  --  103  --   --   BILITOT 4.6*  --  4.6*  --  3.7*  --   --   GFRNONAA 10*   < > 11*   < > 11* 12* 14*  GFRAA 12*   < > 13*   < > 12* 13* 16*  ANIONGAP 12   < > 16*   < > 17* 14 11   < > = values in this interval not displayed.     Hematology Recent Labs  Lab 07/09/19 0255 07/10/19 0157 07/11/19 0208  WBC 5.5 7.4 9.1  RBC 3.20* 3.39* 3.59*  HGB 9.6* 10.3* 10.8*  HCT 29.4* 31.3* 32.2*  MCV 91.9 92.3 89.7  MCH 30.0 30.4 30.1  MCHC 32.7 32.9 33.5  RDW 15.8* 15.7* 15.6*  PLT 42* 56* 63*    BNP Recent Labs  Lab 07/06/19 0628  BNP 2,666.4*     DDimer No results for input(s): DDIMER in the last 168 hours.   Radiology    US RENAL  Result Date: 07/09/2019 CLINICAL DATA:  Inpatient.  Acute kidney injury. EXAM: RENAL / URINARY TRACT ULTRASOUND COMPLETE COMPARISON:  07/07/2019 abdominal sonogram. FINDINGS: Right Kidney: Renal measurements: 11.4 x 4.4 x 5.1 cm = volume: 133 mL. Echogenic right renal parenchyma, normal thickness. No hydronephrosis. Simple 3.7 x 2.6 x 3.2 cm interpolar right renal cyst. Separate simple 2.9 x 2.4 x 2.7 cm interpolar right renal cyst. Left Kidney: Renal measurements: 11.6 x 4.6 x 4.3 cm = volume: 119 mL. Echogenic left renal parenchyma, normal thickness. No hydronephrosis. No renal masses. Bladder:  Appears normal for degree of bladder distention. Ureteral jets demonstrated bilaterally in the bladder. Other: Perihepatic ascites. IMPRESSION: 1. No hydronephrosis. 2. Echogenic normal size kidneys, compatible with reported history of nonspecific acute renal parenchymal disease. 3. Simple right renal cyst. 4. Normal bladder. 5. Incidental perihepatic ascites. Electronically Signed   By: Ilona Sorrel M.D.   On: 07/09/2019 10:39   ECHOCARDIOGRAM COMPLETE  Result Date: 07/09/2019    ECHOCARDIOGRAM REPORT   Patient Name:   MR. Ricardo Payne Date of Exam: 07/09/2019 Medical Rec #:  [id number]               Height:       68.0 in Accession #:    [id number]              Weight:       222.7 lb Date of Birth:  1930/07/10                BSA:          2.14 m Patient Age:    86 years                BP:           85/52 mmHg Patient Gender: M                       HR:           65 bpm. Exam Location:  Inpatient Procedure: 2D Echo, Cardiac Doppler and Color Doppler Indications:    I50.23 Acute on chronic systolic (congestive) heart failure  History:        Patient has prior history of Echocardiogram examinations, most                 recent 04/02/2019. Cardiomyopathy,  CAD, Defibrillator, Stroke and                 Pulmonary HTN, Arrythmias:Atrial Fibrillation; Risk                 Factors:Hypertension and Dyslipidemia. CKD.  Sonographer:    Jonelle Sidle Dance Referring Phys: [id number] [place]  1. Left ventricular ejection fraction, by estimation, is <20%. The left ventricle has severely decreased function. The left ventrical demonstrates global hypokinesis. The left ventricular internal cavity size was mildly dilated. Left ventricular diastolic parameters are indeterminate.  2. Right ventricular systolic function is moderately reduced. The right ventricular size is mildly enlarged. There is moderately elevated pulmonary artery systolic pressure. The estimated right ventricular systolic pressure is [id number] mmHg.   3. Mild to moderate mitral valve regurgitation.  4. Tricuspid valve regurgitation is severe.  5. The aortic valve is tricuspid. Mild aortic valve stenosis, mean gradient only 6 mmHg but visually at least mild AS. No aortic insufficiency.  6. Aortic dilatation noted. There is mild dilatation of the aortic root measuring 41 mm.  7. Dilated IVC suggesting RA pressure 15 mmHg.  8. Rhythm difficult to discern, may be atrial fibrillation. FINDINGS  Left Ventricle: Left ventricular ejection fraction, by estimation, is <20%. The left ventricle has severely decreased function. The left ventricle demonstrates global hypokinesis. The left ventricular internal cavity size was mildly dilated. There is no  left ventricular hypertrophy. Right Ventricle: The right ventricular size is mildly enlarged. No increase in right ventricular wall thickness. Right ventricular systolic function is moderately reduced. There is moderately elevated pulmonary artery systolic pressure. The tricuspid regurgitant velocity is 2.82 m/s, and with an assumed right atrial pressure of 15 mmHg, the estimated right ventricular systolic pressure is [id number] mmHg. Left Atrium: Left atrial size was moderately dilated. Right Atrium: Right atrial size was moderately dilated. Pericardium: Trivial pericardial effusion is present. Mitral Valve: The mitral valve is normal in structure and function. There is mild calcification of the mitral valve leaflet(s). Moderate mitral annular calcification. Mild to moderate mitral valve regurgitation. Tricuspid Valve: The tricuspid valve is normal in structure. Tricuspid valve regurgitation is severe. Aortic Valve: The aortic valve is tricuspid. Aortic valve regurgitation is not visualized. Aortic regurgitation PHT measures 754 msec. Mild aortic stenosis is present. Aortic valve mean gradient measures 6.0 mmHg. Aortic valve peak gradient measures 11.0  mmHg. Aortic valve area, by VTI measures 1.16 cm. Pulmonic Valve: The pulmonic  valve was normal in structure. Pulmonic valve regurgitation is trivial. Aorta: Aortic dilatation noted. There is mild dilatation of the aortic root measuring 41 mm. Venous: The inferior vena cava is dilated in size with less than 50% respiratory variability, suggesting right atrial pressure of 15 mmHg. IAS/Shunts: No atrial level shunt detected by color flow Doppler. Additional Comments: A pacer wire is visualized in the right ventricle.  LEFT VENTRICLE PLAX 2D LVIDd:         6.30 cm LVIDs:         5.50 cm LV PW:         1.20 cm LV IVS:        1.00 cm LVOT diam:     2.40 cm LV SV:         36.58 ml LV SV Index:   24.03 LVOT Area:     4.52 cm  RIGHT VENTRICLE            IVC RV Basal diam:  3.70 cm    IVC diam:  2.60 cm RV Mid diam:    3.10 cm RV S prime:     4.22 cm/s TAPSE (M-mode): 1.7 cm LEFT ATRIUM              Index       RIGHT ATRIUM           Index LA diam:        5.70 cm  2.66 cm/m  RA Area:     26.90 cm LA Vol (A2C):   106.0 ml 49.55 ml/m RA Volume:   101.00 ml 47.22 ml/m LA Vol (A4C):   102.0 ml 47.68 ml/m LA Biplane Vol: 105.0 ml 49.09 ml/m  AORTIC VALVE AV Area (Vmax):    1.27 cm AV Area (Vmean):   1.29 cm AV Area (VTI):     1.16 cm AV Vmax:           165.50 cm/s AV Vmean:          116.000 cm/s AV VTI:            0.314 m AV Peak Grad:      11.0 mmHg AV Mean Grad:      6.0 mmHg LVOT Vmax:         46.60 cm/s LVOT Vmean:        33.000 cm/s LVOT VTI:          0.081 m LVOT/AV VTI ratio: 0.26 AI PHT:            754 msec  AORTA Ao Root diam: 4.10 cm Ao Asc diam:  3.50 cm MITRAL VALVE                       TRICUSPID VALVE MV Area (PHT): 2.66 cm            TR Peak grad:   31.8 mmHg MV Decel Time: 286 msec            TR Vmax:        282.00 cm/s MV E velocity: 76.15 cm/s 103 cm/s                                    SHUNTS                                    Systemic VTI:  0.08 m                                    Systemic Diam: 2.40 cm Loralie Champagne MD Electronically signed by Loralie Champagne MD Signature  Date/Time: 07/09/2019/6:01:16 PM    Final     Cardiac Studies   Echo 07/09/19 1. Left ventricular ejection fraction, by estimation, is <20%. The left  ventricle has severely decreased function. The left ventrical demonstrates  global hypokinesis. The left ventricular internal cavity size was mildly  dilated. Left ventricular  diastolic parameters are indeterminate.  2. Right ventricular systolic function is moderately reduced. The right  ventricular size is mildly enlarged. There is moderately elevated  pulmonary artery systolic pressure. The estimated right ventricular  systolic pressure is [id number] mmHg.  3. Mild to moderate mitral valve regurgitation.  4. Tricuspid valve regurgitation is severe.  5. The aortic valve is tricuspid. Mild aortic valve stenosis,  mean  gradient only 6 mmHg but visually at least mild AS. No aortic  insufficiency.  6. Aortic dilatation noted. There is mild dilatation of the aortic root  measuring 41 mm.  7. Dilated IVC suggesting RA pressure 15 mmHg.  8. Rhythm difficult to discern, may be atrial fibrillation.   Patient Profile     86 y.o. male with hx of CAD s/p CABG x 1 in 1990, ischemic cardiomyopathy s/p ICD 2019, afib, HTN, HLD, CVA 2018, CKD stage III who is being evaluated for refractory heart failure and worsening kidney function.   Assessment & Plan    Acute on chronic systolic and diastolic HF - Echo showed significant drop in EF (<20%) since study in Nov 2020 when EF was 40% - Device interrogation shows no events, <1% RV paced - BB held/antihypertensives held and the patient was started on midodrine. Pressure this AM 146/80 - Diuretics held, renal function improving.  Would continue to hold diuresis for now  AKI/CKD stage IV - nephrology following>>diuretics held  - creatinine 4.30 > 3.65. Previous to admission it appeared baseline was around 2.   Paroxysmal Afib - anticoagulation previously held for recent ICH, now back on Eliquis 2.5  mg BID  Goals of care - Palliative care consult, family meeting planned  For questions or updates, please contact [facility] Please consult [email or web address] for contact info under     Signed, Cadence Ninfa Meeker, PA-C  07/11/2019, 9:56 AM    Patient seen and examined.  Agree with above documentation.On exam, patient is alert and oriented, regular rate and rhythm, no murmurs, lungs CTAB, no LE edema, + JVD.  Telemetry shows sinus rhythm with rate in 70s to 80s, PVCs.  Renal function improving with holding diuresis, 3.65 today from 4.30 yesterday.  We will continue to hold diuretics.  Nephrology following.  Was present for ICD interrogation, he is RV paced less than 1%.  No significant events.  Family meeting planned today with palliative care for further goals of care discussion.  Donato Heinz, MD

## 2019-07-11 NOTE — Progress Notes (Signed)
Patient ID: Ricardo Payne, male   DOB: November 12, 1930, 86 y.o.   MRN: [id number] [facility] Progress Note   Assessment/ Plan:   1. Acute kidney Injury on chronic kidney disease stage IIIb: Likely hemodynamically mediated in the setting of diastolic dysfunction and relative hypotension with efforts at volume unloading.  Renal function improving overnight with downtrending BUN/creatinine and without any acute electrolyte abnormality.  Although he has pedal edema/volume excess, recommend holding off on diuretics at this time for fear of compounding azotemia.  Additional discussions for goals of care planned for today. 2.  Volume overload in the setting of decompensated CHF/cirrhosis: Antihypertensive therapy/beta-blocker discontinued yesterday and patient started on midodrine with corresponding improvement of blood pressure.  I will prescribe (transient) diuretics if creatinine continues to trend down over the next 24 hours. 3.  Anemia of chronic disease: With low iron saturation and low ferritin, intravenous iron ordered. 4.  Hypertension: Blood pressure noted to be rising following discontinuation of carvedilol and initiation of midodrine, recommend allowing this rise of blood pressure with the goal to tolerate diuretic.  Subjective:   Reports to be feeling better and complains of weakness.  Palliative care note reviewed.   Objective:   BP (!) 146/80 (BP Location: Left Arm)   Pulse 88   Temp 97.9 F (36.6 C) (Oral)   Resp (!) 28   Ht 5\' 8"  (1.727 m)   Wt 103 kg   SpO2 99%   BMI 34.53 kg/m   Intake/Output Summary (Last 24 hours) at 07/11/2019 0725 Last data filed at 07/10/2019 2115 Gross per 24 hour  Intake 600 ml  Output 425 ml  Net 175 ml   Weight change:   Physical Exam: Gen: Comfortably propped up in bed, drinking coffee CVS: Pulse regular rhythm, normal rate, ESM over apex. Resp: Poor inspiratory effort with decreased breath sounds over bases, no rhonchi. Abd:  Soft, obese, nontender Ext: 1+ lower extremity edema  Imaging: US RENAL  Result Date: 07/09/2019 CLINICAL DATA:  Inpatient.  Acute kidney injury. EXAM: RENAL / URINARY TRACT ULTRASOUND COMPLETE COMPARISON:  07/07/2019 abdominal sonogram. FINDINGS: Right Kidney: Renal measurements: 11.4 x 4.4 x 5.1 cm = volume: 133 mL. Echogenic right renal parenchyma, normal thickness. No hydronephrosis. Simple 3.7 x 2.6 x 3.2 cm interpolar right renal cyst. Separate simple 2.9 x 2.4 x 2.7 cm interpolar right renal cyst. Left Kidney: Renal measurements: 11.6 x 4.6 x 4.3 cm = volume: 119 mL. Echogenic left renal parenchyma, normal thickness. No hydronephrosis. No renal masses. Bladder: Appears normal for degree of bladder distention. Ureteral jets demonstrated bilaterally in the bladder. Other: Perihepatic ascites. IMPRESSION: 1. No hydronephrosis. 2. Echogenic normal size kidneys, compatible with reported history of nonspecific acute renal parenchymal disease. 3. Simple right renal cyst. 4. Normal bladder. 5. Incidental perihepatic ascites. Electronically Signed   By: Ilona Sorrel M.D.   On: 07/09/2019 10:39   ECHOCARDIOGRAM COMPLETE  Result Date: 07/09/2019    ECHOCARDIOGRAM REPORT   Patient Name:   MR. Ricardo Payne Date of Exam: 07/09/2019 Medical Rec #:  [id number]               Height:       68.0 in Accession #:    [id number]              Weight:       222.7 lb Date of Birth:  1931-04-29                BSA:  2.14 m Patient Age:    38 years                BP:           85/52 mmHg Patient Gender: M                       HR:           65 bpm. Exam Location:  Inpatient Procedure: 2D Echo, Cardiac Doppler and Color Doppler Indications:    I50.23 Acute on chronic systolic (congestive) heart failure  History:        Patient has prior history of Echocardiogram examinations, most                 recent 04/02/2019. Cardiomyopathy, CAD, Defibrillator, Stroke and                 Pulmonary HTN, Arrythmias:Atrial  Fibrillation; Risk                 Factors:Hypertension and Dyslipidemia. CKD.  Sonographer:    Jonelle Sidle Dance Referring Phys: [id number] [place]  1. Left ventricular ejection fraction, by estimation, is <20%. The left ventricle has severely decreased function. The left ventrical demonstrates global hypokinesis. The left ventricular internal cavity size was mildly dilated. Left ventricular diastolic parameters are indeterminate.  2. Right ventricular systolic function is moderately reduced. The right ventricular size is mildly enlarged. There is moderately elevated pulmonary artery systolic pressure. The estimated right ventricular systolic pressure is [id number] mmHg.  3. Mild to moderate mitral valve regurgitation.  4. Tricuspid valve regurgitation is severe.  5. The aortic valve is tricuspid. Mild aortic valve stenosis, mean gradient only 6 mmHg but visually at least mild AS. No aortic insufficiency.  6. Aortic dilatation noted. There is mild dilatation of the aortic root measuring 41 mm.  7. Dilated IVC suggesting RA pressure 15 mmHg.  8. Rhythm difficult to discern, may be atrial fibrillation. FINDINGS  Left Ventricle: Left ventricular ejection fraction, by estimation, is <20%. The left ventricle has severely decreased function. The left ventricle demonstrates global hypokinesis. The left ventricular internal cavity size was mildly dilated. There is no  left ventricular hypertrophy. Right Ventricle: The right ventricular size is mildly enlarged. No increase in right ventricular wall thickness. Right ventricular systolic function is moderately reduced. There is moderately elevated pulmonary artery systolic pressure. The tricuspid regurgitant velocity is 2.82 m/s, and with an assumed right atrial pressure of 15 mmHg, the estimated right ventricular systolic pressure is [id number] mmHg. Left Atrium: Left atrial size was moderately dilated. Right Atrium: Right atrial size was moderately dilated.  Pericardium: Trivial pericardial effusion is present. Mitral Valve: The mitral valve is normal in structure and function. There is mild calcification of the mitral valve leaflet(s). Moderate mitral annular calcification. Mild to moderate mitral valve regurgitation. Tricuspid Valve: The tricuspid valve is normal in structure. Tricuspid valve regurgitation is severe. Aortic Valve: The aortic valve is tricuspid. Aortic valve regurgitation is not visualized. Aortic regurgitation PHT measures 754 msec. Mild aortic stenosis is present. Aortic valve mean gradient measures 6.0 mmHg. Aortic valve peak gradient measures 11.0  mmHg. Aortic valve area, by VTI measures 1.16 cm. Pulmonic Valve: The pulmonic valve was normal in structure. Pulmonic valve regurgitation is trivial. Aorta: Aortic dilatation noted. There is mild dilatation of the aortic root measuring 41 mm. Venous: The inferior vena cava is dilated in size with less than 50% respiratory variability, suggesting right atrial  pressure of 15 mmHg. IAS/Shunts: No atrial level shunt detected by color flow Doppler. Additional Comments: A pacer wire is visualized in the right ventricle.  LEFT VENTRICLE PLAX 2D LVIDd:         6.30 cm LVIDs:         5.50 cm LV PW:         1.20 cm LV IVS:        1.00 cm LVOT diam:     2.40 cm LV SV:         36.58 ml LV SV Index:   24.03 LVOT Area:     4.52 cm  RIGHT VENTRICLE            IVC RV Basal diam:  3.70 cm    IVC diam: 2.60 cm RV Mid diam:    3.10 cm RV S prime:     4.22 cm/s TAPSE (M-mode): 1.7 cm LEFT ATRIUM              Index       RIGHT ATRIUM           Index LA diam:        5.70 cm  2.66 cm/m  RA Area:     26.90 cm LA Vol (A2C):   106.0 ml 49.55 ml/m RA Volume:   101.00 ml 47.22 ml/m LA Vol (A4C):   102.0 ml 47.68 ml/m LA Biplane Vol: 105.0 ml 49.09 ml/m  AORTIC VALVE AV Area (Vmax):    1.27 cm AV Area (Vmean):   1.29 cm AV Area (VTI):     1.16 cm AV Vmax:           165.50 cm/s AV Vmean:          116.000 cm/s AV VTI:             0.314 m AV Peak Grad:      11.0 mmHg AV Mean Grad:      6.0 mmHg LVOT Vmax:         46.60 cm/s LVOT Vmean:        33.000 cm/s LVOT VTI:          0.081 m LVOT/AV VTI ratio: 0.26 AI PHT:            754 msec  AORTA Ao Root diam: 4.10 cm Ao Asc diam:  3.50 cm MITRAL VALVE                       TRICUSPID VALVE MV Area (PHT): 2.66 cm            TR Peak grad:   31.8 mmHg MV Decel Time: 286 msec            TR Vmax:        282.00 cm/s MV E velocity: 76.15 cm/s 103 cm/s                                    SHUNTS                                    Systemic VTI:  0.08 m  Systemic Diam: 2.40 cm Loralie Champagne MD Electronically signed by Loralie Champagne MD Signature Date/Time: 07/09/2019/6:01:16 PM    Final     Labs: BMET Recent Labs  Lab 07/06/19 1753 07/06/19 1753 07/07/19 [id number] 07/07/19 1438 07/08/19 [id number] 07/08/19 1845 07/09/19 0255 07/10/19 0157 07/11/19 0208  NA 135   < > 136 135 136 136 136 135 135  K 4.7   < > 4.2 3.6 4.7 4.0 4.0 4.1 4.2  CL 98   < > 100 101 97* 99 94* 96* 100  CO2 20*   < > 21* 22 23 23 25 25 24   GLUCOSE 137*   < > 117* 120* 117* 162* 126* 117* 109*  BUN 83*   < > 91* 104* 115* 118* 119* 120* 110*  CREATININE 4.12*   < > 4.11* 4.69* 4.52* 4.42* 4.62* 4.30* 3.65*  CALCIUM 8.9   < > 8.7* 8.2* 8.3* 8.3* 8.3* 8.5* 8.7*  PHOS 4.1  --   --   --   --   --   --  3.2 2.8   < > = values in this interval not displayed.   CBC Recent Labs  Lab 07/08/19 0714 07/09/19 0255 07/10/19 0157 07/11/19 0208  WBC 8.7 5.5 7.4 9.1  HGB 11.8* 9.6* 10.3* 10.8*  HCT 35.8* 29.4* 31.3* 32.2*  MCV 92.3 91.9 92.3 89.7  PLT 52* 42* 56* 63*    Medications:    . apixaban  2.5 mg Oral BID  . Chlorhexidine Gluconate Cloth  6 each Topical Daily  . clotrimazole  10 mg Oral 5 X Daily  . folic acid-pyridoxine-cyancobalamin  1 tablet Oral Daily  . mouth rinse  15 mL Mouth Rinse BID  . midodrine  10 mg Oral TID WC  . pramipexole  0.25 mg Oral BID  . simvastatin  40  mg Oral q1800  . thiamine  100 mg Oral Daily  . Vitamin D (Ergocalciferol)  50,000 Units Oral Q7 days   Elmarie Shiley, MD 07/11/2019, 7:25 AM

## 2019-07-11 NOTE — NC FL2 (Signed)
[place] LEVEL OF CARE SCREENING TOOL     IDENTIFICATION  Patient Name: Ricardo Payne Birthdate: 17-Jun-1930 Sex: male Admission Date (Current Location): 07/06/2019  [facility] and [place] Number:  Herbalist and Address:  [facility] [place], [place] [address], [place], [place] [address]      Provider Number: [id number]  Attending Physician Name and Address:  Axel Filler, *  Relative Name and Phone Number:  son Karma Greaser [phone]    Current Level of Care: Hospital Recommended Level of Care: [place] Prior Approval Number: 07/11/19  Date Approved/Denied:   PASRR Number: [id number]  Discharge Plan: SNF    Current Diagnoses: Patient Active Problem List   Diagnosis Date Noted  . Palliative care by specialist   . Goals of care, counseling/discussion   . Cirrhosis ([place])   . CKD (chronic kidney disease) 06/25/2019  . Head trauma 06/25/2019  . Blood thinned due to long-term anticoagulant use 06/25/2019  . Anemia 06/25/2019  . Acute kidney injury superimposed on chronic kidney disease ([place]) 05/23/2019  . Chronic combined systolic and diastolic CHF (congestive heart failure) ([place]) 05/23/2019  . Esophageal hypertension 05/23/2019  . Thrombocytopenia ([place]) 05/23/2019  . Acute on chronic HFrEF (heart failure with reduced ejection fraction) ([place]) 05/22/2019  . ICD (implantable cardioverter-defibrillator) in place 04/18/2019    Orientation RESPIRATION BLADDER Height & Weight     Self, Time, Situation, Place  O2 Continent Weight: 103 kg Height:  5\' 8"  (172.7 cm)  BEHAVIORAL SYMPTOMS/MOOD NEUROLOGICAL BOWEL NUTRITION STATUS      Continent Diet(heart healthy)  AMBULATORY STATUS COMMUNICATION OF NEEDS Skin   Limited Assist Verbally (Laceration and bruising to Left orbital area from fall a few weeks ago)                       [place] Level of Assistance  Bathing, Feeding, Dressing Bathing  Assistance: Limited assistance Feeding assistance: Limited assistance Dressing Assistance: Limited assistance     Functional Limitations Info  Sight, Hearing, Speech Sight Info: Adequate Hearing Info: Adequate Speech Info: Adequate    SPECIAL CARE FACTORS FREQUENCY  PT (By licensed PT), OT (By licensed OT)     PT Frequency: 5 times a week OT Frequency: 5 times a week            Contractures Contractures Info: Not present    Additional Factors Info  Code Status, Allergies Code Status Info: Full Code (07/11/19) Allergies Info: none listed           Current Medications (07/11/2019):  This is the current hospital active medication list Current Facility-Administered Medications  Medication Dose Route Frequency Provider Last Rate Last Admin  . acetaminophen (TYLENOL) tablet 650 mg  650 mg Oral Q6H PRN Ina Homes, MD   650 mg at 07/08/19 2234   Or  . acetaminophen (TYLENOL) suppository 650 mg  650 mg Rectal Q6H PRN Ina Homes, MD      . apixaban (ELIQUIS) tablet 2.5 mg  2.5 mg Oral BID Ina Homes, MD   2.5 mg at 07/11/19 0830  . Chlorhexidine Gluconate Cloth 2 % PADS 6 each  6 each Topical Daily Axel Filler, MD   6 each at 07/10/19 [phone]  . clotrimazole (MYCELEX) troche 10 mg  10 mg Oral 5 X Daily [name], MD   10 mg at 07/11/19 [phone]  . ferumoxytol (FERAHEME) 510 mg in sodium chloride 0.9 % 100 mL IVPB  510 mg Intravenous Lauretta Chester, MD      . folic acid-pyridoxine-cyancobalamin (FOLTX) 2.5-25-2 MG per tablet 1 tablet  1 tablet Oral Daily Ina Homes, MD   1 tablet at 07/10/19 0936  . levalbuterol (XOPENEX) nebulizer solution 0.63 mg  0.63 mg Nebulization BID BM PRN Axel Filler, MD   0.63 mg at 07/10/19 1407  . MEDLINE mouth rinse  15 mL Mouth Rinse BID Axel Filler, MD   15 mL at 07/11/19 0841  . midodrine (PROAMATINE) tablet 10 mg  10 mg Oral TID WC Edrick Oh, MD   10 mg at 07/11/19 [id number]  . ondansetron (ZOFRAN)  tablet 4 mg  4 mg Oral Q6H PRN Ina Homes, MD       Or  . ondansetron (ZOFRAN) injection 4 mg  4 mg Intravenous Q6H PRN Helberg, Larkin Ina, MD      . pramipexole (MIRAPEX) tablet 0.25 mg  0.25 mg Oral BID Ina Homes, MD   0.25 mg at 07/11/19 0829  . simvastatin (ZOCOR) tablet 40 mg  40 mg Oral q1800 Ina Homes, MD   40 mg at 07/10/19 1744  . thiamine tablet 100 mg  100 mg Oral Daily Helberg, Larkin Ina, MD   100 mg at 07/11/19 0830  . Vitamin D (Ergocalciferol) (DRISDOL) capsule 50,000 Units  50,000 Units Oral Q7 days Para March, [facility]   50,000 Units at 07/08/19 [id number]     Discharge Medications: Please see discharge summary for a list of discharge medications.  Relevant Imaging Results:  Relevant Lab Results:   Additional Information [id number]  Carles Collet, RN

## 2019-07-11 NOTE — Discharge Summary (Addendum)
Name: Ricardo Payne MRN: [id number] DOB: [birth year] 86 y.o. PCP: [name], FNP  Date of Admission: 07/06/2019  6:03 AM Date of Discharge: 07/12/2019 Attending Physician: Lalla Brothers, MD FACP  Discharge Diagnosis: 1. Acute hypoxic respiratory failure 2. Acute on chronic HFrEF 3. AKI on CKD 4. Oral candidiasis  Discharge Medications: Allergies as of 07/12/2019   No Known Allergies     Medication List    STOP taking these medications   bumetanide 1 MG tablet Commonly known as: BUMEX   carvedilol 6.25 MG tablet Commonly known as: COREG   sulfamethoxazole-trimethoprim 800-160 MG tablet Commonly known as: BACTRIM DS     TAKE these medications   allopurinol 100 MG tablet Commonly known as: ZYLOPRIM Take 0.5 tablets (50 mg total) by mouth every other day. What changed:   how much to take  when to take this   clotrimazole 10 MG troche Commonly known as: MYCELEX Take 1 tablet (10 mg total) by mouth 5 (five) times daily for 12 days.   Durezol 0.05 % Emul Generic drug: Difluprednate Place 1 drop into the right eye 2 (two) times daily. Until Wed, then 1 drop daily   Eliquis 2.5 MG Tabs tablet Generic drug: apixaban Take 2.5 mg by mouth 2 (two) times daily.   ergocalciferol 1.25 MG (50000 UT) capsule Commonly known as: VITAMIN D2 Take 50,000 Units by mouth once a week. Sunday   ferrous sulfate 325 (65 FE) MG tablet Take 325 mg by mouth daily with breakfast.   Folic Acid-Vit [id number] 2.5-25-1 MG Tabs tablet Commonly known as: FOLBEE Take 1 tablet by mouth daily.   midodrine 10 MG tablet Commonly known as: PROAMATINE Take 1 tablet (10 mg total) by mouth 3 (three) times daily with meals.   pramipexole 0.25 MG tablet Commonly known as: MIRAPEX Take 0.25 mg by mouth 2 (two) times daily.   simvastatin 40 MG tablet Commonly known as: ZOCOR Take 40 mg by mouth daily at 6 PM.   torsemide 10 MG tablet Commonly known as: DEMADEX Take 1 tablet (10  mg total) by mouth daily. Start taking on: July 13, 2019       Disposition and follow-up:   RicardoZaquan ANUSH Payne was discharged from [facility] in [place] condition.  At the hospital follow up visit please address:  1.  Please assess volume status and check BMP once per week for renal function. Patient was restarted on torsemide 10 mg once daily at discharge and the dosage may need to be adjusted based on renal function or volume status.  Please continue discussions on goals of care. Patient had discussion with palliative care in hospital and is considering what end of life care he would want.  Please have patient followup with palliative care. Discussions were initiated in the hospital and patient would benefit from followup with palliative care.  2.  Labs / imaging needed at time of follow-up: BMP to check renal function and CBC to check patient's platelet levels  3.  Pending labs/ test needing follow-up: None  Follow-up Appointments:   Hospital Course by problem list:  # Acute hypoxic respiratory failure # Acute on chronic HFrEF, ischemic Patient is an 86 year old male with past medical history significant for coronary artery disease status post CABG x1, ischemic cardiomyopathy status post ICD, paroxysmal atrial fibrillation on Eliquis, hypertension, hyperlipidemia, CVA and CKD stage III who presented to the ER on 2/5 with signs of worsening shortness of breath and lower extremity edema.  Patient was diuresed aggressively and returned euvolemic status.  High-resolution CT was performed for continued shortness of breath and did not demonstrate ILD.  Given concern for cirrhosis on CT, complete abdominal ultrasound was performed which revealed normal hepatic echogenicity.  Viral hepatitis work-up (hepatitis A, B, C) was also within normal limits.  A repeat TTE demonstrated severely reduced EF less than 20%, newly worsened from EF 40-45% in November.  With continued low  blood pressures, patient was started on midodrine 10 mg 3 times daily for hemodynamic support.  Cardiology was consulted, and recommended the patient could be considered for inotropic support but there is no long-term fix for patient's underlying condition and ultimate poor prognosis. Patient was started on torsemide 10 mg once daily at discharge.  # AKI on CKD Stage III: Baseline creatinine around 2.0, elevated up to 4.7 during this admission, value of 2.98 on day of discharge.   # Thrombocytopenia: Per outpatient hematology note, patient with ITP.  Platelets initially downtrended and reached a nadir of 42 but then uptrended to a value of 63 on day of discharge. There were no signs/symptoms of active bleeding.  # Kidney lesion: 2.5 cm exophytic solid lesion was seen in the upper pole of the right kidney on complete abdominal ultrasound.  However, on dedicated renal ultrasound only simple renal cysts were appreciated.  # Oral candidiasis: Patient wears dentures and was found to have oral candidiasis, and was started on oral clotrimazole on 07/10/2019. Will discharge to complete 2 week duration of therapy, last day on 07/24/2019. Patient was HIV negative.  Discharge Vitals:   BP (!) 103/42 (BP Location: Right Arm)   Pulse 77   Temp 98.5 F (36.9 C) (Axillary)   Resp (!) 23   Ht 5\' 8"  (1.727 m)   Wt 90.5 kg   SpO2 99%   BMI 30.34 kg/m   Pertinent Labs, Studies, and Procedures:  BMP Latest Ref Rng & Units 07/12/2019 07/11/2019 07/10/2019  Glucose 70 - 99 mg/dL 118(H) 109(H) 117(H)  BUN 8 - 23 mg/dL 94(H) 110(H) 120(H)  Creatinine 0.61 - 1.24 mg/dL 2.98(H) 3.65(H) 4.30(H)  BUN/Creat Ratio 10 - 24 - - -  Sodium 135 - 145 mmol/L 137 135 135  Potassium 3.5 - 5.1 mmol/L 4.3 4.2 4.1  Chloride 98 - 111 mmol/L 101 100 96(L)  CO2 22 - 32 mmol/L 25 24 25   Calcium 8.9 - 10.3 mg/dL 8.9 8.7(L) 8.5(L)   CBC Latest Ref Rng & Units 07/11/2019 07/10/2019 07/09/2019  WBC 4.0 - 10.5 K/uL 9.1 7.4 5.5  Hemoglobin  13.0 - 17.0 g/dL 10.8(L) 10.3(L) 9.6(L)  Hematocrit 39.0 - 52.0 % 32.2(L) 31.3(L) 29.4(L)  Platelets 150 - 400 K/uL 63(L) 56(L) 42(L)   CT Head WO Contrast (06/27/19, prior hospital visit): IMPRESSION: Age related volume loss. Bilateral subdural hygromas. Small amount of wispy blood within the subdural space on the left without a frank subdural hematoma. No mass effect. No midline shift. No Hydrocephalus.  CT Chest High Resolution (07/07/19): IMPRESSION: 1. Minimal bibasilar atelectasis. Trace bilateral pleural effusions. No evidence of fibrotic interstitial lung disease or acute appearing airspace disease. 2. Cardiomegaly. Three-vessel coronary artery calcifications and/or stents status post median sternotomy and CABG. 3. Cirrhotic morphology of the liver in the included upper abdomen. Small volume perihepatic ascites. 4. Aortic Atherosclerosis (ICD10-I70.0).  Complete Abdominal Ultrasound (07/07/19): IMPRESSION: No focal hepatic abnormality.  Borderline splenic size.  2.5 cm exophytic solid lesion off the upper pole the right kidney. When the patient is clinically  stable and able to follow directions and hold their breath (preferably as an outpatient) further evaluation with dedicated abdominal MRI should be considered.  Renal Ultrasound (07/09/19): IMPRESSION: 1. No hydronephrosis. 2. Echogenic normal size kidneys, compatible with reported history of nonspecific acute renal parenchymal disease. 3. Simple right renal cyst. 4. Normal bladder. 5. Incidental perihepatic ascites.   Discharge Instructions: Discharge Instructions    Call MD for:  difficulty breathing, headache or visual disturbances   Complete by: As directed    Call MD for:  persistant dizziness or light-headedness   Complete by: As directed    Call MD for:  temperature >100.4   Complete by: As directed    Diet - low sodium heart healthy   Complete by: As directed    Discharge instructions   Complete by:  As directed    You were seen in the hospital for worsening of your heart and kidney function. We have started you on torsemide 10 mg daily. This is a water pill which will help prevent you from getting short of breath. We want you to followup once a week for lab work that will help check on your kidney function. Thank you for allowing [place] to be part of your medical care!   Increase activity slowly   Complete by: As directed       Signed: [name], MD 07/12/2019, 9:17 AM

## 2019-07-11 NOTE — Progress Notes (Signed)
  Subjective:  Patient seen at bedside, eating breakfast.  Patient states he is breathing well.  Patient informed on plan of care, all questions answered.    Objective:    Vital Signs (last 24 hours): Vitals:   07/10/19 2000 07/10/19 2200 07/10/19 2330 07/11/19 0300  BP: 104/63 122/68    Pulse:      Resp:      Temp:   98.2 F (36.8 C) 98.3 F (36.8 C)  TempSrc:   Oral Oral  SpO2:      Weight:      Height:        Physical Exam: General Resting in bed, no acute distress  Pulmonary Breathing comfortably on room air, no cough, no distress   Neurology Alert and answers questions appropriately, no gross deficit   BMP Latest Ref Rng & Units 07/11/2019 07/10/2019 07/09/2019  Glucose 70 - 99 mg/dL 109(H) 117(H) 126(H)  BUN 8 - 23 mg/dL 110(H) 120(H) 119(H)  Creatinine 0.61 - 1.24 mg/dL 3.65(H) 4.30(H) 4.62(H)  BUN/Creat Ratio 10 - 24 - - -  Sodium 135 - 145 mmol/L 135 135 136  Potassium 3.5 - 5.1 mmol/L 4.2 4.1 4.0  Chloride 98 - 111 mmol/L 100 96(L) 94(L)  CO2 22 - 32 mmol/L 24 25 25   Calcium 8.9 - 10.3 mg/dL 8.7(L) 8.5(L) 8.3(L)   CBC Latest Ref Rng & Units 07/11/2019 07/10/2019 07/09/2019  WBC 4.0 - 10.5 K/uL 9.1 7.4 5.5  Hemoglobin 13.0 - 17.0 g/dL 10.8(L) 10.3(L) 9.6(L)  Hematocrit 39.0 - 52.0 % 32.2(L) 31.3(L) 29.4(L)  Platelets 150 - 400 K/uL 63(L) 56(L) 42(L)   Assessment/Plan:   Principal Problem:   Acute on chronic HFrEF (heart failure with reduced ejection fraction) (HCC) Active Problems:   Acute kidney injury superimposed on chronic kidney disease (HCC)   Cirrhosis ([place])   Palliative care by specialist   Goals of care, counseling/discussion  Patient is an 86 year old male with past medical history significant for coronary artery disease status post CABG x1, ischemic myopathy status post ICD, paroxysmal atrial fibrillation on Eliquis, hypertension, hyperlipidemia, CVA and CKD stage III presented to the ER on 2/5 with signs of worsening shortness of breath and lower  extremity edema.  # Acute hypoxic respiratory failure # Acute on chronic HFrEF, EF < 20%, ischemic Patient initially with severe volume overload and was diuresed aggressively and returned to euvolemic status. TTE yesterday demonstrated severely reduced EF less than 20%, newly worsened from EF 40-45% on November TTE *Continue midodrine 10 mg 3 times daily *Daily weights, monitor I/Os - 425 ml output since 7am yesterday *Cardiology following, we appreciate their recommendations *Palliative care consulted - plan for family meeting today at 3pm  # AKI on CKD Stage III: Baseline creatinine around 2.0, elevated to 3.8 on admission, likely secondary to cardiorenal syndrome, Trend: 3.82 -> 4.11 -> 4.52 -> 4.62 -> 4.30 -> 3.65 *Nephrology recommends hemodynamic support with midodrine and intermittent diuretics as creatinine improves  # Thrombocytopenia: Per hematology note, patient with ITP.  Platelet trend:78 -> 52 -> 42 -> 56 -> 63.  No indication for platelet transfusion at this time in the absence of bleeding.  # Kidney lesion: 2.5 cm exophytic solid lesion in the upper pole of the right kidney, recommend outpatient MRI for further work-up  # Oral candidiasis: Continue oral clotrimazole  PT/OT: Recommend SNF Diet: Heart healthy DVT Ppx: Eliquis 2.5 mg twice daily Dispo: Anticipated discharge in approximately 0-1 days  [name], MD 07/11/2019, 6:36 AM

## 2019-07-12 LAB — CULTURE, BLOOD (ROUTINE X 2)
Culture: NO GROWTH
Culture: NO GROWTH
Special Requests: ADEQUATE

## 2019-07-12 LAB — RENAL FUNCTION PANEL
Albumin: 3 g/dL — ABNORMAL LOW (ref 3.5–5.0)
Anion gap: 11 (ref 5–15)
BUN: 94 mg/dL — ABNORMAL HIGH (ref 8–23)
CO2: 25 mmol/L (ref 22–32)
Calcium: 8.9 mg/dL (ref 8.9–10.3)
Chloride: 101 mmol/L (ref 98–111)
Creatinine, Ser: 2.98 mg/dL — ABNORMAL HIGH (ref 0.61–1.24)
GFR calc Af Amer: 21 mL/min — ABNORMAL LOW (ref >60)
GFR calc non Af Amer: 18 mL/min — ABNORMAL LOW (ref >60)
Glucose, Bld: 118 mg/dL — ABNORMAL HIGH (ref 70–99)
Phosphorus: 3 mg/dL (ref 2.5–4.6)
Potassium: 4.3 mmol/L (ref 3.5–5.1)
Sodium: 137 mmol/L (ref 135–145)

## 2019-07-12 MED ORDER — TORSEMIDE 10 MG PO TABS: 10.0000 mg | ORAL_TABLET | Freq: Every day | ORAL | 0 refills | Status: DC

## 2019-07-12 MED ORDER — CLOTRIMAZOLE 10 MG MT TROC: 10.0000 mg | Freq: Every day | OROMUCOSAL | 0 refills | Status: AC

## 2019-07-12 MED ORDER — MIDODRINE HCL 10 MG PO TABS: 10.0000 mg | ORAL_TABLET | Freq: Three times a day (TID) | ORAL | 0 refills | Status: DC

## 2019-07-12 MED ORDER — TORSEMIDE 20 MG PO TABS
10.0000 mg | ORAL_TABLET | Freq: Every day | ORAL | Status: DC
  Administered 2019-07-12: 10 mg via ORAL
  Filled 2019-07-12: qty 1

## 2019-07-12 MED ORDER — ALLOPURINOL 100 MG PO TABS: 50.0000 mg | ORAL_TABLET | ORAL | 0 refills | Status: AC

## 2019-07-12 NOTE — Plan of Care (Signed)

## 2019-07-12 NOTE — TOC Transition Note (Signed)
Transition of Care [facility]) - CM/SW Discharge Note   Patient Details  Name: Ricardo Payne MRN: [id number] Date of Birth: 1931/05/31  Transition of Care [facility]) CM/SW Contact:  Pollie Friar, RN Phone Number: 07/12/2019, 11:51 AM   Clinical Narrative:    Pt discharging to [facility] today for rehab. Pt to transport via [facility]. Report called and d/c packet in patients chart. Bedside RN and son aware.   Room: 113 Number for report: [phone]   Final next level of care: Skilled Nursing Facility Barriers to Discharge: No Barriers Identified   Patient Goals and CMS Choice Patient states their goals for this hospitalization and ongoing recovery are:: to go to SNF prior to returning home CMS Medicare.gov Compare Post Acute Care list provided to:: Patient Represenative (must comment) Choice offered to / list presented to : Adult Children  Discharge Placement PASRR number recieved: 07/12/19            Patient chooses bed at: ([facility]) Patient to be transferred to facility by: [place] Name of family member notified: Son: [name] Patient and family notified of of transfer: 07/12/19  Discharge Plan and Services   Discharge Planning Services: CM Consult Post Acute Care Choice: [place]                               Social Determinants of Health (SDOH) Interventions     Readmission Risk Interventions No flowsheet data found.

## 2019-07-12 NOTE — Progress Notes (Signed)
Patient discharging to [facility] Will be transported via [place] given to [employer]

## 2019-07-12 NOTE — Progress Notes (Signed)
Patient ID: Ricardo Payne, male   DOB: [birth year], 86 y.o.   MRN: [id number] [facility] Progress Note   Assessment/ Plan:   1. Acute kidney Injury on chronic kidney disease stage IIIb: This appears to be hemodynamically mediated in the setting of diastolic dysfunction/hypotension and diuresis.  Renal function continues to show improvement and today we will start him on torsemide 10 mg daily to augment diuresis; this will need to be followed up with weekly labs to decide on additional adjustment of therapy and avoidance of hypokalemia (in the setting of cirrhosis).  I appreciate input from the palliative care service and their notes are reviewed.  Given his current physical state and burden of comorbidities, I do not feel is appropriate for chronic hemodialysis as this would hasten his mortality and cause further physical debilitation.  I recommend continued surveillance of his renal function by his primary care provider/SNF without scheduled renal follow-up. 2.  Volume overload in the setting of decompensated CHF/cirrhosis: Antihypertensive therapy discontinued in the setting of hypotension and patient started on midodrine 2 days ago.  Restart low-dose torsemide. 3.  Anemia of chronic disease: On intravenous iron for iron deficiency. 4.  Hypertension: Blood pressure within acceptable range on midodrine.  Subjective:   Reports that he continues to feel somewhat better and denies any chest pain or shortness of breath.  Palliative care note reviewed.   Objective:   BP 135/63   Pulse 77   Temp 97.8 F (36.6 C) (Oral)   Resp (!) 23   Ht 5\' 8"  (1.727 m)   Wt 90.5 kg   SpO2 99%   BMI 30.34 kg/m   Intake/Output Summary (Last 24 hours) at 07/12/2019 [id number] Last data filed at 07/12/2019 0457 Gross per 24 hour  Intake 920.09 ml  Output 1120 ml  Net -199.91 ml   Weight change:   Physical Exam: Gen: Appears comfortable sitting up in recliner CVS: Pulse regular rhythm, normal  rate, ESM over apex. Resp: Poor inspiratory effort with decreased breath sounds over bases, no rhonchi. Abd: Soft, obese, nontender Ext: 1-2+ lower extremity edema  Imaging: No results found.  Labs: BMET Recent Labs  Lab 07/06/19 1753 07/07/19 0220 07/07/19 1438 07/08/19 0714 07/08/19 1845 07/09/19 0255 07/10/19 0157 07/11/19 0208 07/12/19 0221  NA 135   < > 135 136 136 136 135 135 137  K 4.7   < > 3.6 4.7 4.0 4.0 4.1 4.2 4.3  CL 98   < > 101 97* 99 94* 96* 100 101  CO2 20*   < > 22 23 23 25 25 24 25   GLUCOSE 137*   < > 120* 117* 162* 126* 117* 109* 118*  BUN 83*   < > 104* 115* 118* 119* 120* 110* 94*  CREATININE 4.12*   < > 4.69* 4.52* 4.42* 4.62* 4.30* 3.65* 2.98*  CALCIUM 8.9   < > 8.2* 8.3* 8.3* 8.3* 8.5* 8.7* 8.9  PHOS 4.1  --   --   --   --   --  3.2 2.8 3.0   < > = values in this interval not displayed.   CBC Recent Labs  Lab 07/08/19 0714 07/09/19 0255 07/10/19 0157 07/11/19 0208  WBC 8.7 5.5 7.4 9.1  HGB 11.8* 9.6* 10.3* 10.8*  HCT 35.8* 29.4* 31.3* 32.2*  MCV 92.3 91.9 92.3 89.7  PLT 52* 42* 56* 63*    Medications:    . apixaban  2.5 mg Oral BID  . Chlorhexidine Gluconate Cloth  6 each Topical Daily  . clotrimazole  10 mg Oral 5 X Daily  . folic acid-pyridoxine-cyancobalamin  1 tablet Oral Daily  . mouth rinse  15 mL Mouth Rinse BID  . midodrine  10 mg Oral TID WC  . pramipexole  0.25 mg Oral BID  . simvastatin  40 mg Oral q1800  . thiamine  100 mg Oral Daily  . Vitamin D (Ergocalciferol)  50,000 Units Oral Q7 days   Elmarie Shiley, MD 07/12/2019, 7:12 AM

## 2019-07-12 NOTE — Progress Notes (Signed)
Physical Therapy Treatment Patient Details Name: Ricardo Payne MRN: [id number] DOB: 1931-05-09 Today's Date: 07/12/2019    History of Present Illness Pt is an 86 y.o. male admitted 07/06/19 with worsening SOB. Found to have fluid overload; worked up for acute CHF with reduced EF. PMH includes CKD, ischemic heart disease, HTN, PAF, HLD, ICD. Of note, pt with recent fall.    PT Comments    Pt admitted with above diagnosis. Pt was able to ambulate with RW with min guard assist.  Pt required standing rest breaks to keep sats >87% on RA.  Pt making good progress and will do well at SNF for Rehab.  Pt currently with functional limitations due to balance and endurance deficits. Pt will benefit from skilled PT to increase their independence and safety with mobility to allow discharge to the venue listed below.     Follow Up Recommendations  SNF;Supervision for mobility/OOB     Equipment Recommendations  (TBD)    Recommendations for Other Services (Palliative medicine)     Precautions / Restrictions Precautions Precautions: Fall Precaution Comments: patient had fall 2 weeks ago, facial lacerations and bruising Restrictions Weight Bearing Restrictions: No    Mobility  Bed Mobility Overal bed mobility: Needs Assistance Bed Mobility: Supine to Sit;Sit to Supine     Supine to sit: Min guard Sit to supine: Independent   General bed mobility comments: guard for safety to EOB  Transfers Overall transfer level: Needs assistance Equipment used: Rolling walker (2 wheeled) Transfers: Sit to/from Stand;Lateral/Scoot Transfers Sit to Stand: Min guard         General transfer comment: Pt needed cues for hand placement and min guard assist for balance  Ambulation/Gait Ambulation/Gait assistance: Min guard;Min assist Gait Distance (Feet): 110 Feet Assistive device: Rolling walker (2 wheeled) Gait Pattern/deviations: Step-to pattern;Trunk flexed Gait velocity: decreased   General  Gait Details: Pt needed cues to stay close to RW.  Pt took several standing rest breaks as his DOE was 3/4.  Pt did desat on RA to 87% x2 with ambulationbut each time would stop and breathe without being asked to and his sats would return to >90%.     Stairs             Wheelchair Mobility    Modified Rankin (Stroke Patients Only)       Balance Overall balance assessment: Needs assistance Sitting-balance support: No upper extremity supported;Feet supported Sitting balance-Leahy Scale: Good     Standing balance support: Bilateral upper extremity supported;During functional activity Standing balance-Leahy Scale: Fair Standing balance comment: relaint on BUE support                            Cognition Arousal/Alertness: Awake/alert Behavior During Therapy: WFL for tasks assessed/performed;Flat affect   Area of Impairment: Memory;Following commands;Safety/judgement;Awareness;Problem solving                 Orientation Level: Disoriented to;Time   Memory: Decreased short-term memory Following Commands: Follows one step commands consistently;Follows multi-step commands inconsistently Safety/Judgement: Decreased awareness of safety;Decreased awareness of deficits Awareness: Emergent Problem Solving: Requires verbal cues        Exercises General Exercises - Lower Extremity Ankle Circles/Pumps: AROM;Both;10 reps;Supine Long Arc Quad: AROM;Both;10 reps;Seated Straight Leg Raises: AROM;Both;10 reps;Supine Hip Flexion/Marching: AROM;Both;10 reps;Seated    General Comments General comments (skin integrity, edema, etc.): BP 126/75, 98% at rest, 86-89 bpm      Pertinent Vitals/Pain Pain Assessment: No/denies  pain    Home Living                      Prior Function            PT Goals (current goals can now be found in the care plan section) Acute Rehab PT Goals Patient Stated Goal: Agreeable to post-acute rehab to regain strength before  returning home Progress towards PT goals: Progressing toward goals    Frequency    Min 3X/week      PT Plan Current plan remains appropriate    Co-evaluation              AM-PAC PT "6 Clicks" Mobility   Outcome Measure  Help needed turning from your back to your side while in a flat bed without using bedrails?: None Help needed moving from lying on your back to sitting on the side of a flat bed without using bedrails?: A Little Help needed moving to and from a bed to a chair (including a wheelchair)?: A Little Help needed standing up from a chair using your arms (e.g., wheelchair or bedside chair)?: A Little Help needed to walk in hospital room?: A Little Help needed climbing 3-5 steps with a railing? : A Lot 6 Click Score: 18    End of Session Equipment Utilized During Treatment: Gait belt Activity Tolerance: Patient limited by fatigue Patient left: in bed;with call bell/phone within reach;with bed alarm set Nurse Communication: Mobility status PT Visit Diagnosis: Other abnormalities of gait and mobility (R26.89);History of falling (Z91.81);Difficulty in walking, not elsewhere classified (R26.2);Muscle weakness (generalized) (M62.81)     Time: [id number] PT Time Calculation (min) (ACUTE ONLY): 20 min  Charges:  $Gait Training: 8-22 mins                     Salil Raineri W,PT [place] Pager:  [phone]  Office:  [place] 07/12/2019, 1:40 PM

## 2019-07-12 NOTE — Progress Notes (Signed)
Progress Note  Patient Name: Ricardo Payne Date of Encounter: 07/12/2019  Primary Cardiologist: Donato Heinz, MD   Subjective   Renal function continues to improve (Cr 3.65->2.98).  BP 103/42 this morning on midodrine 10 mg TID.  He reports dyspnea has improved.  Inpatient Medications    Scheduled Meds: . apixaban  2.5 mg Oral BID  . Chlorhexidine Gluconate Cloth  6 each Topical Daily  . clotrimazole  10 mg Oral 5 X Daily  . folic acid-pyridoxine-cyancobalamin  1 tablet Oral Daily  . mouth rinse  15 mL Mouth Rinse BID  . midodrine  10 mg Oral TID WC  . pramipexole  0.25 mg Oral BID  . simvastatin  40 mg Oral q1800  . thiamine  100 mg Oral Daily  . torsemide  10 mg Oral Daily  . Vitamin D (Ergocalciferol)  50,000 Units Oral Q7 days   Continuous Infusions: . ferumoxytol 510 mg (07/11/19 1108)   PRN Meds: acetaminophen **OR** acetaminophen, levalbuterol, ondansetron **OR** ondansetron (ZOFRAN) IV   Vital Signs    Vitals:   07/12/19 0036 07/12/19 0424 07/12/19 0533 07/12/19 0717  BP: 122/68 135/63  (!) 103/42  Pulse: 78 77 77   Resp: 19 16 (!) 23   Temp: 98.4 F (36.9 C) 97.8 F (36.6 C)  98.5 F (36.9 C)  TempSrc: Oral Oral  Axillary  SpO2: 97% 100% 99%   Weight:   90.5 kg   Height:        Intake/Output Summary (Last 24 hours) at 07/12/2019 0812 Last data filed at 07/12/2019 0457 Gross per 24 hour  Intake 720.09 ml  Output 870 ml  Net -149.91 ml   Last 3 Weights 07/12/2019 07/10/2019 07/07/2019  Weight (lbs) 199 lb 8.3 oz 227 lb 1.2 oz 222 lb 10.6 oz  Weight (kg) 90.5 kg 103 kg 101 kg      Telemetry    NSR in 80s, PVCs  - Personally Reviewed  ECG    No new - Personally Reviewed  Physical Exam   GEN: No acute distress.   Neck: JVD Cardiac: RRR, no murmurs Respiratory: CTAB GI: Soft, nontender, non-distended  MS: 1+ edema Neuro:  Nonfocal  Psych: Normal affect   Labs    High Sensitivity Troponin:   Recent Labs  Lab  07/06/19 0709 07/06/19 0902  TROPONINIHS <2 <2      Chemistry Recent Labs  Lab 07/07/19 1438 07/07/19 1438 07/08/19 0714 07/08/19 1845 07/09/19 0255 07/09/19 0255 07/10/19 0157 07/11/19 0208 07/12/19 0221  NA 135   < > 136   < > 136   < > 135 135 137  K 3.6   < > 4.7   < > 4.0   < > 4.1 4.2 4.3  CL 101   < > 97*   < > 94*   < > 96* 100 101  CO2 22   < > 23   < > 25   < > 25 24 25   GLUCOSE 120*   < > 117*   < > 126*   < > 117* 109* 118*  BUN 104*   < > 115*   < > 119*   < > 120* 110* 94*  CREATININE 4.69*   < > 4.52*   < > 4.62*   < > 4.30* 3.65* 2.98*  CALCIUM 8.2*   < > 8.3*   < > 8.3*   < > 8.5* 8.7* 8.9  PROT 5.9*  --  6.2*  --  5.3*  --   --   --   --   ALBUMIN 3.2*   < > 3.1*  --  2.8*   < > 3.0* 2.9* 3.0*  AST 113*  --  160*  --  154*  --   --   --   --   ALT 61*  --  85*  --  96*  --   --   --   --   ALKPHOS 95  --  94  --  103  --   --   --   --   BILITOT 4.6*  --  4.6*  --  3.7*  --   --   --   --   GFRNONAA 10*   < > 11*   < > 11*   < > 12* 14* 18*  GFRAA 12*   < > 13*   < > 12*   < > 13* 16* 21*  ANIONGAP 12   < > 16*   < > 17*   < > 14 11 11    < > = values in this interval not displayed.     Hematology Recent Labs  Lab 07/09/19 0255 07/10/19 0157 07/11/19 0208  WBC 5.5 7.4 9.1  RBC 3.20* 3.39* 3.59*  HGB 9.6* 10.3* 10.8*  HCT 29.4* 31.3* 32.2*  MCV 91.9 92.3 89.7  MCH 30.0 30.4 30.1  MCHC 32.7 32.9 33.5  RDW 15.8* 15.7* 15.6*  PLT 42* 56* 63*    BNP Recent Labs  Lab 07/06/19 0628  BNP 2,666.4*     DDimer No results for input(s): DDIMER in the last 168 hours.   Radiology    No results found.  Cardiac Studies   Echo 07/09/19 1. Left ventricular ejection fraction, by estimation, is <20%. The left  ventricle has severely decreased function. The left ventrical demonstrates  global hypokinesis. The left ventricular internal cavity size was mildly  dilated. Left ventricular  diastolic parameters are indeterminate.  2. Right ventricular  systolic function is moderately reduced. The right  ventricular size is mildly enlarged. There is moderately elevated  pulmonary artery systolic pressure. The estimated right ventricular  systolic pressure is [id number] mmHg.  3. Mild to moderate mitral valve regurgitation.  4. Tricuspid valve regurgitation is severe.  5. The aortic valve is tricuspid. Mild aortic valve stenosis, mean  gradient only 6 mmHg but visually at least mild AS. No aortic  insufficiency.  6. Aortic dilatation noted. There is mild dilatation of the aortic root  measuring 41 mm.  7. Dilated IVC suggesting RA pressure 15 mmHg.  8. Rhythm difficult to discern, may be atrial fibrillation.   Patient Profile     86 y.o. male with hx of CAD s/p CABG x 1 in 1990, ischemic cardiomyopathy s/p ICD 2019, afib, HTN, HLD, CVA 2018, CKD stage III who is being evaluated for refractory heart failure and worsening kidney function.   Assessment & Plan    Acute on chronic systolic and diastolic HF - Echo showed significant drop in EF (<20%) since study in Nov 2020 when EF was 40% - Device interrogation shows no events, <1% RV paced - BB held/antihypertensives held and the patient was started on midodrine. Pressure this AM 103/42 - Renal function improving with holding diuretics.  Starting on torsemide 10 mg daily today per nephrology  AKI/CKD stage IV - nephrology following>>restarting torsemide today as above - creatinine 4.30 > 3.65->2.98. Previous to admission it appeared baseline was around 2.  Paroxysmal Afib - anticoagulation previously held for recent ICH, now back on Eliquis 2.5 mg BID  Goals of care - Palliative care consult, family meeting  Held yesterday.  Remains full code.  Disposition: planning discharge to SNF today  [facility] will sign off.   Medication Recommendations:  Apixiban 2.5 mg BID.  Torsemide per nephrology.  Holding coreg due to hypotension requiring midodrine Other recommendations (labs,  testing, etc):  BMET within 1 week  Follow up as an outpatient:  Has f/u appointment with me scheduled for 3/15   For questions or updates, please contact [place] Please consult [email or web address] for contact info under     Signed, Donato Heinz, MD  07/12/2019, 8:12 AM

## 2019-07-12 NOTE — Progress Notes (Signed)
  Subjective:  Patient states that he feels well today and is breathing well. Patient asked about palliative care discussion yesterday. Patient states he is not sure of the need as he feels good. Counseled patient on the importance for him to articulate his end of life wishes given the nature of his condition.  Objective:    Vital Signs (last 24 hours): Vitals:   07/12/19 0036 07/12/19 0424 07/12/19 0533 07/12/19 0717  BP: 122/68 135/63  (!) 103/42  Pulse: 78 77 77   Resp: 19 16 (!) 23   Temp: 98.4 F (36.9 C) 97.8 F (36.6 C)  98.5 F (36.9 C)  TempSrc: Oral Oral  Axillary  SpO2: 97% 100% 99%   Weight:   90.5 kg   Height:        Physical Exam: General Resting in chair, no acute distress  Pulmonary Breathing comfortably on room air, no cough, no distress   Neurology Alert and answers questions appropriately, no gross deficit   BMP Latest Ref Rng & Units 07/12/2019 07/11/2019 07/10/2019  Glucose 70 - 99 mg/dL 118(H) 109(H) 117(H)  BUN 8 - 23 mg/dL 94(H) 110(H) 120(H)  Creatinine 0.61 - 1.24 mg/dL 2.98(H) 3.65(H) 4.30(H)  BUN/Creat Ratio 10 - 24 - - -  Sodium 135 - 145 mmol/L 137 135 135  Potassium 3.5 - 5.1 mmol/L 4.3 4.2 4.1  Chloride 98 - 111 mmol/L 101 100 96(L)  CO2 22 - 32 mmol/L 25 24 25   Calcium 8.9 - 10.3 mg/dL 8.9 8.7(L) 8.5(L)    Assessment/Plan:   Principal Problem:   Acute on chronic HFrEF (heart failure with reduced ejection fraction) (HCC) Active Problems:   Acute kidney injury superimposed on chronic kidney disease (HCC)   Cirrhosis (HCC)   Palliative care by specialist   Goals of care, counseling/discussion   Patient is an 86 year old male with past medical history significant for coronary artery disease status post CABG x1, ischemic cardiomyopathy status post ICD, paroxysmal atrial fibrillation on Eliquis, hypertension, hyperlipidemia, CVA and CKD stage III who presented to the ER on 2/5 with signs of worsening shortness of breath and lower extremity  edema.  # Acute hypoxic respiratory failure # Acute on chronic HFrEF, EF < 20%, ischemic Patient initially was severe volume overload and was diuresed aggressively and returned euvolemic status, TTE yesterday demonstrated severely reduced EF less than 20%, newly worsened from EF 40-45% on November TTE *Continue midodrine 10 mg 3 times daily *Plan to restart p.o. torsemide 10 mg daily per nephrology *Will need outpatient PCP follow-up, weekly BMPs  # AKI on CKD Stage III: Baseline creatinine around 2.0, elevated to 3.8 on admission, likely secondary to cardiorenal syndrome.  Trend: 3.82 -> 4.11 -> 4.52 -> 4.62 -> 4.30 -> 3.65 -> 2.98 *Nephrology recommends hemodynamic support with midodrine and intermittent diuretics as creatinine improves.   # Oral candidiasis: Continue oral clotrimazole for total 14 day course  PT/OT: Recommends SNF, patient to go this AM Dispo: Anticipated discharge today  [name], MD 07/12/2019, 7:28 AM

## 2019-07-17 ENCOUNTER — Telehealth: Payer: Self-pay

## 2019-07-17 NOTE — Telephone Encounter (Signed)
Brittney from the nursing home [facility] called stating the pt do not have his home remote monitor to send a transmission.   I called the pt son [name] and he agreed to drop the pt monitor off at the nursing home today.   I called Brittney know that the son will bring the monitor to the nursing home and gave her my direct office number for her to call me if they need help sending the transmission. She thanked me for the call and verbalized understanding.

## 2019-07-18 ENCOUNTER — Ambulatory Visit (INDEPENDENT_AMBULATORY_CARE_PROVIDER_SITE_OTHER): Payer: Medicare Other | Admitting: *Deleted

## 2019-07-18 ENCOUNTER — Other Ambulatory Visit: Payer: Self-pay

## 2019-07-18 ENCOUNTER — Emergency Department (HOSPITAL_COMMUNITY): Payer: Medicare Other

## 2019-07-18 ENCOUNTER — Encounter (HOSPITAL_COMMUNITY): Payer: Self-pay | Admitting: Emergency Medicine

## 2019-07-18 ENCOUNTER — Inpatient Hospital Stay (HOSPITAL_COMMUNITY)
Admission: EM | Admit: 2019-07-18 | Discharge: 2019-07-20 | DRG: 291 | Disposition: A | Discharge status: Skilled Nursing Facility | Payer: Medicare Other | Source: Skilled Nursing Facility | Attending: Internal Medicine | Admitting: Internal Medicine

## 2019-07-18 DIAGNOSIS — Z87891 Personal history of nicotine dependence: Secondary | ICD-10-CM

## 2019-07-18 DIAGNOSIS — Z951 Presence of aortocoronary bypass graft: Secondary | ICD-10-CM

## 2019-07-18 DIAGNOSIS — N184 Chronic kidney disease, stage 4 (severe): Secondary | ICD-10-CM | POA: Diagnosis present

## 2019-07-18 DIAGNOSIS — I130 Hypertensive heart and chronic kidney disease with heart failure and stage 1 through stage 4 chronic kidney disease, or unspecified chronic kidney disease: Secondary | ICD-10-CM | POA: Diagnosis not present

## 2019-07-18 DIAGNOSIS — I2510 Atherosclerotic heart disease of native coronary artery without angina pectoris: Secondary | ICD-10-CM | POA: Diagnosis present

## 2019-07-18 DIAGNOSIS — M1A9XX0 Chronic gout, unspecified, without tophus (tophi): Secondary | ICD-10-CM | POA: Diagnosis present

## 2019-07-18 DIAGNOSIS — I071 Rheumatic tricuspid insufficiency: Secondary | ICD-10-CM | POA: Diagnosis present

## 2019-07-18 DIAGNOSIS — I5042 Chronic combined systolic (congestive) and diastolic (congestive) heart failure: Secondary | ICD-10-CM | POA: Diagnosis not present

## 2019-07-18 DIAGNOSIS — G2581 Restless legs syndrome: Secondary | ICD-10-CM | POA: Diagnosis present

## 2019-07-18 DIAGNOSIS — Z79899 Other long term (current) drug therapy: Secondary | ICD-10-CM

## 2019-07-18 DIAGNOSIS — I471 Supraventricular tachycardia: Secondary | ICD-10-CM | POA: Diagnosis present

## 2019-07-18 DIAGNOSIS — I5023 Acute on chronic systolic (congestive) heart failure: Secondary | ICD-10-CM | POA: Diagnosis present

## 2019-07-18 DIAGNOSIS — I5020 Unspecified systolic (congestive) heart failure: Secondary | ICD-10-CM | POA: Diagnosis present

## 2019-07-18 DIAGNOSIS — I509 Heart failure, unspecified: Secondary | ICD-10-CM

## 2019-07-18 DIAGNOSIS — Z95810 Presence of automatic (implantable) cardiac defibrillator: Secondary | ICD-10-CM

## 2019-07-18 DIAGNOSIS — M25512 Pain in left shoulder: Secondary | ICD-10-CM | POA: Diagnosis present

## 2019-07-18 DIAGNOSIS — J9601 Acute respiratory failure with hypoxia: Secondary | ICD-10-CM | POA: Diagnosis present

## 2019-07-18 DIAGNOSIS — R3911 Hesitancy of micturition: Secondary | ICD-10-CM | POA: Diagnosis present

## 2019-07-18 DIAGNOSIS — N189 Chronic kidney disease, unspecified: Secondary | ICD-10-CM | POA: Diagnosis present

## 2019-07-18 DIAGNOSIS — Z8673 Personal history of transient ischemic attack (TIA), and cerebral infarction without residual deficits: Secondary | ICD-10-CM

## 2019-07-18 DIAGNOSIS — D6859 Other primary thrombophilia: Secondary | ICD-10-CM | POA: Diagnosis present

## 2019-07-18 DIAGNOSIS — E782 Mixed hyperlipidemia: Secondary | ICD-10-CM | POA: Diagnosis present

## 2019-07-18 DIAGNOSIS — I959 Hypotension, unspecified: Secondary | ICD-10-CM | POA: Diagnosis present

## 2019-07-18 DIAGNOSIS — Z20822 Contact with and (suspected) exposure to covid-19: Secondary | ICD-10-CM | POA: Diagnosis present

## 2019-07-18 DIAGNOSIS — I480 Paroxysmal atrial fibrillation: Secondary | ICD-10-CM | POA: Diagnosis present

## 2019-07-18 DIAGNOSIS — I255 Ischemic cardiomyopathy: Secondary | ICD-10-CM | POA: Diagnosis present

## 2019-07-18 DIAGNOSIS — Z8249 Family history of ischemic heart disease and other diseases of the circulatory system: Secondary | ICD-10-CM

## 2019-07-18 DIAGNOSIS — R350 Frequency of micturition: Secondary | ICD-10-CM | POA: Diagnosis present

## 2019-07-18 DIAGNOSIS — E559 Vitamin D deficiency, unspecified: Secondary | ICD-10-CM | POA: Diagnosis present

## 2019-07-18 DIAGNOSIS — N401 Enlarged prostate with lower urinary tract symptoms: Secondary | ICD-10-CM | POA: Diagnosis present

## 2019-07-18 LAB — TROPONIN I (HIGH SENSITIVITY)
Troponin I (High Sensitivity): <2 ng/L (ref <18)
Troponin I (High Sensitivity): <2 ng/L (ref <18)

## 2019-07-18 LAB — CBC WITH DIFFERENTIAL/PLATELET
Abs Immature Granulocytes: 0.04 10*3/uL (ref 0.00–0.07)
Basophils Absolute: 0 10*3/uL (ref 0.0–0.1)
Basophils Relative: 1 %
Eosinophils Absolute: 0 10*3/uL (ref 0.0–0.5)
Eosinophils Relative: 0 %
HCT: 41.9 % (ref 39.0–52.0)
Hemoglobin: 13 g/dL (ref 13.0–17.0)
Immature Granulocytes: 1 %
Lymphocytes Relative: 13 %
Lymphs Abs: 1.1 10*3/uL (ref 0.7–4.0)
MCH: 30.6 pg (ref 26.0–34.0)
MCHC: 31 g/dL (ref 30.0–36.0)
MCV: 98.6 fL (ref 80.0–100.0)
Monocytes Absolute: 0.7 10*3/uL (ref 0.1–1.0)
Monocytes Relative: 8 %
Neutro Abs: 6.5 10*3/uL (ref 1.7–7.7)
Neutrophils Relative %: 77 %
Platelets: 121 10*3/uL — ABNORMAL LOW (ref 150–400)
RBC: 4.25 MIL/uL (ref 4.22–5.81)
RDW: 18.2 % — ABNORMAL HIGH (ref 11.5–15.5)
WBC: 8.3 10*3/uL (ref 4.0–10.5)
nRBC: 0 % (ref 0.0–0.2)

## 2019-07-18 LAB — CUP PACEART REMOTE DEVICE CHECK
Battery Remaining Longevity: 91 mo
Battery Remaining Percentage: 88 %
Battery Voltage: 3.08 V
Brady Statistic RV Percent Paced: 1 %
Date Time Interrogation Session: 20210217020016
HighPow Impedance: 42 Ohm
HighPow Impedance: 42 Ohm
Implantable Lead Implant Date: 20191018
Implantable Lead Location: 753860
Implantable Pulse Generator Implant Date: 20191018
Lead Channel Impedance Value: 380 Ohm
Lead Channel Pacing Threshold Amplitude: 0.75 V
Lead Channel Pacing Threshold Pulse Width: 0.5 ms
Lead Channel Sensing Intrinsic Amplitude: 11.4 mV
Lead Channel Setting Pacing Amplitude: 2 V
Lead Channel Setting Pacing Pulse Width: 0.5 ms
Lead Channel Setting Sensing Sensitivity: 0.5 mV
Pulse Gen Serial Number: 9850983

## 2019-07-18 LAB — BASIC METABOLIC PANEL
Anion gap: 16 — ABNORMAL HIGH (ref 5–15)
BUN: 47 mg/dL — ABNORMAL HIGH (ref 8–23)
CO2: 20 mmol/L — ABNORMAL LOW (ref 22–32)
Calcium: 9.5 mg/dL (ref 8.9–10.3)
Chloride: 102 mmol/L (ref 98–111)
Creatinine, Ser: 2.47 mg/dL — ABNORMAL HIGH (ref 0.61–1.24)
GFR calc Af Amer: 26 mL/min — ABNORMAL LOW (ref >60)
GFR calc non Af Amer: 23 mL/min — ABNORMAL LOW (ref >60)
Glucose, Bld: 122 mg/dL — ABNORMAL HIGH (ref 70–99)
Potassium: 4.7 mmol/L (ref 3.5–5.1)
Sodium: 138 mmol/L (ref 135–145)

## 2019-07-18 LAB — BRAIN NATRIURETIC PEPTIDE: B Natriuretic Peptide: 2554.9 pg/mL — ABNORMAL HIGH (ref 0.0–100.0)

## 2019-07-18 LAB — SARS CORONAVIRUS 2 (TAT 6-24 HRS): SARS Coronavirus 2: NEGATIVE

## 2019-07-18 MED ORDER — SIMVASTATIN 20 MG PO TABS
40.0000 mg | ORAL_TABLET | Freq: Every day | ORAL | Status: DC
  Administered 2019-07-18 – 2019-07-19 (×2): 40 mg via ORAL
  Filled 2019-07-18 (×2): qty 2

## 2019-07-18 MED ORDER — FOLIC ACID-VIT B6-VIT B12 2.5-25-1 MG PO TABS
1.0000 | ORAL_TABLET | Freq: Every day | ORAL | Status: DC
  Administered 2019-07-19: 1 via ORAL
  Filled 2019-07-18 (×2): qty 1

## 2019-07-18 MED ORDER — MIDODRINE HCL 5 MG PO TABS
10.0000 mg | ORAL_TABLET | Freq: Three times a day (TID) | ORAL | Status: DC
  Administered 2019-07-19 – 2019-07-20 (×5): 10 mg via ORAL
  Filled 2019-07-18 (×5): qty 2

## 2019-07-18 MED ORDER — ALLOPURINOL 100 MG PO TABS
50.0000 mg | ORAL_TABLET | ORAL | Status: DC
  Administered 2019-07-20: 50 mg via ORAL
  Filled 2019-07-18: qty 1

## 2019-07-18 MED ORDER — FUROSEMIDE 10 MG/ML IJ SOLN
40.0000 mg | Freq: Once | INTRAMUSCULAR | Status: AC
  Administered 2019-07-18: 40 mg via INTRAVENOUS
  Filled 2019-07-18: qty 4

## 2019-07-18 MED ORDER — APIXABAN 2.5 MG PO TABS
2.5000 mg | ORAL_TABLET | Freq: Two times a day (BID) | ORAL | Status: DC
  Administered 2019-07-18 – 2019-07-20 (×4): 2.5 mg via ORAL
  Filled 2019-07-18 (×5): qty 1

## 2019-07-18 MED ORDER — ACETAMINOPHEN 650 MG RE SUPP: 650.0000 mg | Freq: Four times a day (QID) | RECTAL | Status: DC | PRN

## 2019-07-18 MED ORDER — FERROUS SULFATE 325 (65 FE) MG PO TABS
325.0000 mg | ORAL_TABLET | Freq: Every day | ORAL | Status: DC
  Administered 2019-07-19 – 2019-07-20 (×2): 325 mg via ORAL
  Filled 2019-07-18 (×3): qty 1

## 2019-07-18 MED ORDER — ACETAMINOPHEN 325 MG PO TABS
650.0000 mg | ORAL_TABLET | Freq: Four times a day (QID) | ORAL | Status: DC | PRN
  Administered 2019-07-19 – 2019-07-20 (×3): 650 mg via ORAL
  Filled 2019-07-18 (×3): qty 2

## 2019-07-18 MED ORDER — PRAMIPEXOLE DIHYDROCHLORIDE 0.25 MG PO TABS
0.2500 mg | ORAL_TABLET | Freq: Two times a day (BID) | ORAL | Status: DC
  Administered 2019-07-18 – 2019-07-20 (×4): 0.25 mg via ORAL
  Filled 2019-07-18 (×5): qty 1

## 2019-07-18 NOTE — H&P (Signed)
Date: 07/18/2019               Patient Name:  Ricardo Payne MRN: [id number]  DOB: 11/10/1930 Age / Sex: 86 y.o., male   PCP: [name], FNP         Medical Service: Internal Medicine Teaching Service         Attending Physician: Dr. Rebeca Alert Raynaldo Opitz, MD    First Contact: Dr. Benjamine Mola Pager: [phone]  Second Contact: Dr. Maricela Bo Pager: [phone]       After Hours (After 5p/  First Contact Pager: [phone]  weekends / holidays): Second Contact Pager: [phone]   Chief Complaint: Shortness of breath  History of Present Illness: This is an 86 year old male with a history of CAD status post CABG x1 in 1990, ischemic cardiomyopathy status post ICD, paroxysmal atrial fibrillation, hypertension, hyperlipidemia, CVA, and CKD stage 3 who presented from his SNF with worsening shortness of breath, orthopnea, PND, and new oxygen requirements.  He reports that some days he has significant difficulty breathing, however will be improved on other days, reports that it is worse today.  Also endorses decreased appetite, generalized fatigue, difficulty sleeping.  Reports poor urination, reports straining, unable to empty bladder, and decreased urinary output.  He denies any recent fevers, chills, nausea, vomiting, headaches, chest pain, or other symptoms.  Denies any recent travel or any recent sick contacts.  He has been taking his torsemide daily.  Complies with the fluid restriction and low-sodium diet.  He was admitted from 2/5-2/11 for acute hypoxic respiratory failure 2/2 acute HFrEF exacerbation. He was diuresed aggressively during admission. There was concern for cirrhsosi at that time, had an abdominal ultrasound that showed normal liver. TTE showed EF <20%, worsened from prior in November at 40-45%. Cardiology evaluated at that time, reported no long term fix for his condition. Discharged on midodrine 10 mg TID and torsemide 10 mg daily. He has not followed up with cardiology yet.   On  arrival he was noted to be at 91% on RA, placed on oxygen with improvement in his O2 saturations. CXR showed cardiomegaly, no evidence of effusion. Remained SOB in ED requiring supplemental oxygen. Labs significant for Cr 2.47 (last one was 2.98), BNP 2550. Remained afebrile and tachycardic. Given IV lasix and admitted to IM.   Meds: Current Meds  Medication Sig  . allopurinol (ZYLOPRIM) 100 MG tablet Take 0.5 tablets (50 mg total) by mouth every other day.  Marland Kitchen apixaban (ELIQUIS) 2.5 MG TABS tablet Take 2.5 mg by mouth 2 (two) times daily.  . ergocalciferol (VITAMIN D2) 1.25 MG (50000 UT) capsule Take 50,000 Units by mouth once a week. Sunday  . ferrous sulfate 325 (65 FE) MG tablet Take 325 mg by mouth daily with breakfast.  . Folic Acid-Vit [id number] (FOLBEE) 2.5-25-1 MG TABS tablet Take 1 tablet by mouth daily.  . midodrine (PROAMATINE) 10 MG tablet Take 1 tablet (10 mg total) by mouth 3 (three) times daily with meals.  . pramipexole (MIRAPEX) 0.25 MG tablet Take 0.25 mg by mouth 2 (two) times daily.  . simvastatin (ZOCOR) 40 MG tablet Take 40 mg by mouth daily at 6 PM.  . torsemide (DEMADEX) 10 MG tablet Take 1 tablet (10 mg total) by mouth daily.     Allergies: Allergies as of 07/18/2019  . (No Known Allergies)   Past Medical History:  Diagnosis Date  . Acquired thrombophilia ([place])   . Age-related cataract of both eyes   .  ASHD (arteriosclerotic heart disease)   . Benign prostatic hyperplasia   . CAD (coronary artery disease)    a. s/p CABG x1 in 1990  . Cerebellar stroke ([place]) 07/02/2016  . Chronic combined systolic and diastolic CHF (congestive heart failure) ([place])   . Chronic gout without tophus   . CKD (chronic kidney disease), stage IV ([place])   . Essential hypertension   . ICD (implantable cardioverter-defibrillator) in place   . Ischemic cardiomyopathy   . Mixed hyperlipidemia   . PAF (paroxysmal atrial fibrillation) ([place])   . Restless legs   . Secondary pulmonary  arterial hypertension ([place])   . Thrombocytopenia ([place])   . Vitamin D deficiency     Family History:  Family History  Problem Relation Age of Onset  . Hypertension Mother   . Suicidality Mother   . Valvular heart disease Father   . Heart disease Father   . Heart attack Brother     Social History: Moved from [place] in 2020, currently living at a SNF. Step son lives in [place]. Denies any alcohol, smoking, or recreational drug use. Review of Systems: A complete ROS was negative except as per HPI.  Physical Exam: Blood pressure 123/81, pulse (!) 120, temperature 97.7 F (36.5 C), temperature source Oral, resp. rate (!) 21, SpO2 98 %. Physical Exam  Constitutional: He is oriented to person, place, and time and well-developed, well-nourished, and in no distress.  Eyes: Pupils are equal, round, and reactive to light. Conjunctivae and EOM are normal.  Cardiovascular: Regular rhythm and normal heart sounds.  Tachycardic, no JVD appreciated  Pulmonary/Chest: He has no wheezes.  Mildly increased work of breathing, bibasilar crackles  Abdominal: Soft. Bowel sounds are normal. He exhibits no distension. There is abdominal tenderness (minimal TTP over suprapubic area).  Musculoskeletal:        General: Edema (trace BL LE edema) present. Normal range of motion.     Cervical back: Normal range of motion and neck supple.  Neurological: He is alert and oriented to person, place, and time.  Skin: Skin is warm.  Dry skin over lower extremities  Psychiatric: Mood and affect normal.     EKG: personally reviewed my interpretation is sinus tachycardia, LBBB  CXR: personally reviewed my interpretation is minimal bilateral pleural edema  Assessment & Plan by Problem: Active Problems:   Congestive heart failure [facility])  This is an 86 year old male with a history of CAD status post CABG x1 in 1990, ischemic cardiomyopathy status post ICD, paroxysmal atrial fibrillation, hypertension,  hyperlipidemia, CVA, and CKD stage 3 who presented from his SNF with worsening shortness of breath, orthopnea, PND, and new oxygen requirements.   Acute hypoxic respiratory failure 2/2 HFrEF exacerbation: Presented with worsening SOB, orthopnea, fatigue, and decreased appetite. BNP at 2555. CXR unremarkable. Appears to be HF exacerbation. Unclear etiology for exacerbation. Denies any infectious symptoms, dietary or fluid restriction issues, or other precipitating factors. Given his significant decrease in his ejection fraction on his last admission this may be due to progression of his disease vs non-optimal home medications. He also reports some difficulty with urination, will obtain bladder scan to evaluate for retention. He was scheduled to have his home diuretic adjusted based on his kidney function and fluid status on the outpatient basis. Requiring supplemental oxygen during admission, now oxygenating at 100%, should be able to be weaned down on this. Does not appear significantly fluid overloaded on exam. Weight on 2/9 was 227 lbs, weight today not measured. Will continue diuresis  for now and monitor symptoms.  -Lasix 40 mg once -Daily weights, strict I+Os -Cardiac telemetry -ICD in place -Does not need repeat echo at this time -Bladder scan -CMP and BMP in AM  CKD stage 4: -Cr on admission was 2.47, down from his discharge Cr of 3.65. Overall this seems to be improving with his current diuresis. Will monitor closely.  -BMP in AM  Paroxysmal a fib: -Currently in NSR, resumed home eliquis  Hypotension: Patient was discharged on midodrine form last admission. Will resume this here.    GOC: -Palliative care was involved on his last admission, would need ongoing palliative care discussion. Today he reported potential interest in DNR however would like to speak with his son prior to changing anything  Gout: Continue home allopurinol  Dispo: Admit patient to Observation with expected  length of stay less than 2 midnights.  Signed: Asencion Noble, MD 07/18/2019, 7:10 PM  Pager: [phone]

## 2019-07-18 NOTE — ED Provider Notes (Signed)
  Physical Exam  BP (!) 132/93   Pulse 93   Temp 97.7 F (36.5 C) (Oral)   Resp 20   SpO2 100%   Physical Exam  Gen: appears chronically ill Pulm: no respiratory distress or accessory muscle use. No crackles Legs: 1+ pitting edema bilaterally.   ED Course/Procedures     Procedures  MDM   Patient signed out to me by C Aberman, PA-C.  Please see previous notes for further history.  In brief, patient presented for evaluation of worsening shortness of breath.  History of CHF, recently admitted for the same.  On arrival, patient was 91% on room air.  He does not wear oxygen at baseline.  He appears chronically ill.  Chest x-ray shows cardiomegaly, no effusion.  He remains short of breath in the ED, currently on 3 L of oxygen.  He is mildly tachycardic.  He is also complaining of scapular pain, consider PE, although less likely as patient is anticoagulated.  Plan to follow-up on labs, but patient will likely need to be admitted.  Labs interpreted by me.  Mildly elevated creatinine at 2.47, though this is improved from previous.  BNP elevated at 2550.  Similar to last admission, and significantly higher than what he had previously been at.  No leukocytosis.  He is without fever, doubt infection.  On reassessment, patient remains tachycardic.  Sats are stable on oxygen.  He does have mild leg swelling, states this is baseline.  Will give Lasix IV and call for admission.  Discussed with internal medicine teaching service, patient to be admitted.       Franchot Heidelberg, PA-C 07/18/19 1719    Hayden Rasmussen, MD 07/19/19 1210

## 2019-07-18 NOTE — Progress Notes (Signed)
ICD remote 

## 2019-07-18 NOTE — ED Triage Notes (Signed)
Pt arrives from [facility] via [facility] with c/o sob that began this morning. Pt also endorses L shoulder pain as well. Hx of CHF, pt was here 2/5 for similar symptoms-per ems diagnosed with CHF exacerbation. EMS reports pt was 91% on room air with improvement to 96% on 2L via n.c. pt a/ox4, resp e/u, nad. No recent sick contacts, no LE edema.

## 2019-07-18 NOTE — ED Notes (Signed)
Patient's initial room air sat was 95%, patient desatted into the 80s, patient placed on 2L via n.c.with sat improvement to 98%.

## 2019-07-18 NOTE — ED Notes (Signed)
DIET TRAY ORDERED

## 2019-07-18 NOTE — ED Provider Notes (Signed)
[facility] EMERGENCY DEPARTMENT Provider Note   CSN: [id number] Arrival date & time: 07/18/19  1353     History Chief Complaint  Patient presents with  . Shortness of Breath  . Shoulder Pain    Ricardo Payne is a 86 y.o. male with a past medical history significant for CAD, acquired thrombophilia, history of cerebellar stroke, CKD stage IV, hypertension, hyperlipidemia, and congestive heart failure who presents to the ED due to worsening shortness of breath that started this morning.  Chart reviewed.  Patient was admitted to the hospital on 2/5-2/11 for acute hypoxic respiratory failure secondary to congestive heart failure exacerbation.  Patient had an echocardiogram performed during his admission which demonstrated EF less than 20%.  Per triage note, when EMS arrived patient's O2 saturation was 91% on room air which improved to 96% on 2 L nasal cannula.  Patient admits to an intermittent dry cough for the past 4 days.  Patient also admits to posterior left shoulder and mid back pain that started this morning.  Patient denies direct injury.  Patient denies fever and chills.  Contacts and Covid exposures. Denies chest pain. Patient is not currently on home oxygen.     Past Medical History:  Diagnosis Date  . Acquired thrombophilia ([place])   . Age-related cataract of both eyes   . ASHD (arteriosclerotic heart disease)   . Benign prostatic hyperplasia   . CAD (coronary artery disease)    a. s/p CABG x1 in 1990  . Cerebellar stroke ([place]) 07/02/2016  . Chronic combined systolic and diastolic CHF (congestive heart failure) ([place])   . Chronic gout without tophus   . CKD (chronic kidney disease), stage IV ([place])   . Essential hypertension   . ICD (implantable cardioverter-defibrillator) in place   . Ischemic cardiomyopathy   . Mixed hyperlipidemia   . PAF (paroxysmal atrial fibrillation) ([place])   . Restless legs   . Secondary pulmonary arterial hypertension ([place])   .  Thrombocytopenia ([place])   . Vitamin D deficiency     Patient Active Problem List   Diagnosis Date Noted  . Palliative care by specialist   . Goals of care, counseling/discussion   . Cirrhosis ([place])   . CKD (chronic kidney disease) 06/25/2019  . Head trauma 06/25/2019  . Blood thinned due to long-term anticoagulant use 06/25/2019  . Anemia 06/25/2019  . Acute kidney injury superimposed on chronic kidney disease ([place]) 05/23/2019  . Chronic combined systolic and diastolic CHF (congestive heart failure) ([place]) 05/23/2019  . Esophageal hypertension 05/23/2019  . Thrombocytopenia ([place]) 05/23/2019  . Acute on chronic HFrEF (heart failure with reduced ejection fraction) ([place]) 05/22/2019  . ICD (implantable cardioverter-defibrillator) in place 04/18/2019    Past Surgical History:  Procedure Laterality Date  . CHOLECYSTECTOMY    . CORONARY ARTERY BYPASS GRAFT    . PACEMAKER IMPLANT  10/2018       Family History  Problem Relation Age of Onset  . Hypertension Mother   . Suicidality Mother   . Valvular heart disease Father   . Heart disease Father   . Heart attack Brother     Social History   Tobacco Use  . Smoking status: Former Smoker    Quit date: 1988    Years since quitting: 33.1  . Smokeless tobacco: Never Used  Substance Use Topics  . Alcohol use: Yes    Comment: rare  . Drug use: Never    Home Medications Prior to Admission medications   Medication  Sig Start Date End Date Taking? Authorizing Provider  allopurinol (ZYLOPRIM) 100 MG tablet Take 0.5 tablets (50 mg total) by mouth every other day. 07/12/19   [name], MD  apixaban (ELIQUIS) 2.5 MG TABS tablet Take 2.5 mg by mouth 2 (two) times daily.    [provider]  clotrimazole (MYCELEX) 10 MG troche Take 1 tablet (10 mg total) by mouth 5 (five) times daily for 12 days. 07/12/19 07/24/19  [name], MD  DUREZOL 0.05 % EMUL Place 1 drop into the right eye 2 (two) times daily. Until Wed, then 1  drop daily 05/06/19   [provider]  ergocalciferol (VITAMIN D2) 1.25 MG (50000 UT) capsule Take 50,000 Units by mouth once a week. Sunday    [provider]  ferrous sulfate 325 (65 FE) MG tablet Take 325 mg by mouth daily with breakfast.    [provider]  Folic Acid-Vit [id number] [id number] (FOLBEE) 2.5-25-1 MG TABS tablet Take 1 tablet by mouth daily.    [provider]  midodrine (PROAMATINE) 10 MG tablet Take 1 tablet (10 mg total) by mouth 3 (three) times daily with meals. 07/12/19   [name], MD  pramipexole (MIRAPEX) 0.25 MG tablet Take 0.25 mg by mouth 2 (two) times daily.    [provider]  simvastatin (ZOCOR) 40 MG tablet Take 40 mg by mouth daily at 6 PM.    [provider]  torsemide (DEMADEX) 10 MG tablet Take 1 tablet (10 mg total) by mouth daily. 07/13/19   [name], MD    Allergies    Patient has no known allergies.  Review of Systems   Review of Systems  Constitutional: Negative for chills and fever.  Respiratory: Positive for shortness of breath.   Cardiovascular: Positive for leg swelling. Negative for chest pain.  Gastrointestinal: Negative for abdominal pain, diarrhea, nausea and vomiting.  Musculoskeletal: Positive for arthralgias (left shoulder) and back pain.  Neurological: Negative for weakness.  All other systems reviewed and are negative.   Physical Exam Updated Vital Signs BP 130/80   Pulse 93   Temp 97.7 F (36.5 C) (Oral)   Resp 20   SpO2 96%   Physical Exam Vitals and nursing note reviewed.  Constitutional:      General: He is not in acute distress.    Appearance: He is not toxic-appearing.  HENT:     Head: Normocephalic.     Comments: Ecchymosis over left cheek. Eyes:     Pupils: Pupils are equal, round, and reactive to light.  Cardiovascular:     Rate and Rhythm: Normal rate and regular rhythm.     Pulses: Normal pulses.     Heart sounds: Normal heart sounds. No murmur. No  friction rub. No gallop.   Pulmonary:     Effort: Pulmonary effort is normal.     Breath sounds: Normal breath sounds.     Comments: Respirations equal and unlabored, patient able to speak in full sentences, lungs clear to auscultation bilaterally Abdominal:     General: Abdomen is flat. Bowel sounds are normal. There is no distension.     Palpations: Abdomen is soft.     Tenderness: There is no abdominal tenderness. There is no guarding or rebound.  Musculoskeletal:     Cervical back: Neck supple.     Comments: 2+ pitting edema bilaterally.  Reproducible tenderness in the left scapular and left thoracic paraspinal region.  No thoracic or lumbar midline tenderness.  No overlying rash.  No crepitus or deformity.  Skin:    General: Skin is warm and dry.  Neurological:     General: No focal deficit present.     Mental Status: He is alert.  Psychiatric:        Mood and Affect: Mood normal.        Behavior: Behavior normal.     ED Results / Procedures / Treatments   Labs (all labs ordered are listed, but only abnormal results are displayed) Labs Reviewed  CBC WITH DIFFERENTIAL/PLATELET - Abnormal; Notable for the following components:      Result Value   RDW 18.2 (*)    Platelets 121 (*)    All other components within normal limits  BASIC METABOLIC PANEL  BRAIN NATRIURETIC PEPTIDE  TROPONIN I (HIGH SENSITIVITY)    EKG EKG Interpretation  Date/Time:  Wednesday July 18 2019 13:54:55 EST Ventricular Rate:  115 PR Interval:    QRS Duration: 144 QT Interval:  350 QTC Calculation: 485 R Axis:   -23 Text Interpretation: Sinus tachycardia Left bundle branch block No significant change since 07/07/2019 Confirmed by Veryl Speak [phone]) on 07/18/2019 2:26:25 PM   Radiology DG Chest Portable 1 View  Result Date: 07/18/2019 CLINICAL DATA:  Shortness of breath, left shoulder pain EXAM: PORTABLE CHEST 1 VIEW COMPARISON:  07/06/2019 FINDINGS: Left-sided single lead pacer  defibrillator appears to be in similar position. Post sternotomy with rotation on the current view accentuating the enlarged cardiac silhouette which is grossly unchanged compared to the prior study. Signs of calcified atherosclerotic changes in the thoracic aorta. No signs of consolidation or pleural effusion. Mild increase in interstitial markings bilaterally. No acute bone finding. IMPRESSION: Stable cardiomegaly and mild increase in interstitial markings bilaterally perhaps related to mild pulmonary edema. No signs of consolidation or pleural effusion. Electronically Signed   By: Zetta Bills M.D.   On: 07/18/2019 14:48   CUP PACEART REMOTE DEVICE CHECK  Result Date: 07/18/2019 Scheduled remote reviewed.  Normal device function.  Next remote 91 days.   Procedures Procedures (including critical care time)  Medications Ordered in ED Medications - No data to display  ED Course  I have reviewed the triage vital signs and the nursing notes.  Pertinent labs & imaging results that were available during my care of the patient were reviewed by me and considered in my medical decision making (see chart for details).    MDM Rules/Calculators/A&P                     86 year old male presents to the ED due to worsening shortness of breath that started this morning. Chart reviewed.  Patient was admitted to the hospital on 2/5-2/11 for acute hypoxic respiratory failure secondary to congestive heart failure exacerbation.  Patient had an echo cardiogram performed during his admission which demonstrated EF less than 20%.  Patient also admits to left shoulder pain. Denies injury.  Upon arrival, patient is afebrile, tachycardic at 115 with O2 saturation at 95% on 2 L nasal cannula.  Lungs clear to auscultation bilaterally.  2+ pitting edema bilaterally.  reproducible tenderness over left scapula and left thoracic paraspinal region. No midline tenderness. No overlying rash.  Doubt shingles.  Ecchymosis over  left cheek from fall 3 weeks ago. Will obtain routine labs, troponin, BNP, chest x-ray, and EKG. Discussed case with Dr. Stark Jock who evaluated patient at bedside and agrees with assessment and plan.   Personally reviewed which demonstrates sinus tachycardia with a left bundle branch  block.  No significant changes from previous EKG. CXR personally reviewed which demonstrates: Stable cardiomegaly and mild increase in interstitial markings  bilaterally perhaps related to mild pulmonary edema. No signs of  consolidation or pleural effusion.   CBC reassuring with no leukocytosis.  Patient handed off to [facility], PA-C who will follow-up on labs, reassess patient, and determine disposition. If labs are all normal, ambulate patient without oxygen. If patient drops below 93% admit for acute hypoxia.  Final Clinical Impression(s) / ED Diagnoses Final diagnoses:  None    Rx / DC Orders ED Discharge Orders    None       Karie Kirks 07/18/19 1543    Veryl Speak, MD 07/19/19 [phone]

## 2019-07-19 DIAGNOSIS — Z951 Presence of aortocoronary bypass graft: Secondary | ICD-10-CM

## 2019-07-19 DIAGNOSIS — I2510 Atherosclerotic heart disease of native coronary artery without angina pectoris: Secondary | ICD-10-CM | POA: Diagnosis present

## 2019-07-19 DIAGNOSIS — Z95810 Presence of automatic (implantable) cardiac defibrillator: Secondary | ICD-10-CM | POA: Diagnosis not present

## 2019-07-19 DIAGNOSIS — I480 Paroxysmal atrial fibrillation: Secondary | ICD-10-CM

## 2019-07-19 DIAGNOSIS — J9601 Acute respiratory failure with hypoxia: Secondary | ICD-10-CM | POA: Diagnosis present

## 2019-07-19 DIAGNOSIS — Z79899 Other long term (current) drug therapy: Secondary | ICD-10-CM | POA: Diagnosis not present

## 2019-07-19 DIAGNOSIS — I509 Heart failure, unspecified: Secondary | ICD-10-CM | POA: Diagnosis present

## 2019-07-19 DIAGNOSIS — N183 Chronic kidney disease, stage 3 unspecified: Secondary | ICD-10-CM | POA: Diagnosis not present

## 2019-07-19 DIAGNOSIS — Z8249 Family history of ischemic heart disease and other diseases of the circulatory system: Secondary | ICD-10-CM | POA: Diagnosis not present

## 2019-07-19 DIAGNOSIS — E785 Hyperlipidemia, unspecified: Secondary | ICD-10-CM

## 2019-07-19 DIAGNOSIS — I5023 Acute on chronic systolic (congestive) heart failure: Secondary | ICD-10-CM | POA: Diagnosis present

## 2019-07-19 DIAGNOSIS — M25512 Pain in left shoulder: Secondary | ICD-10-CM | POA: Diagnosis present

## 2019-07-19 DIAGNOSIS — I130 Hypertensive heart and chronic kidney disease with heart failure and stage 1 through stage 4 chronic kidney disease, or unspecified chronic kidney disease: Principal | ICD-10-CM

## 2019-07-19 DIAGNOSIS — I255 Ischemic cardiomyopathy: Secondary | ICD-10-CM | POA: Diagnosis present

## 2019-07-19 DIAGNOSIS — I959 Hypotension, unspecified: Secondary | ICD-10-CM | POA: Diagnosis present

## 2019-07-19 DIAGNOSIS — I5020 Unspecified systolic (congestive) heart failure: Secondary | ICD-10-CM | POA: Diagnosis present

## 2019-07-19 DIAGNOSIS — Z8673 Personal history of transient ischemic attack (TIA), and cerebral infarction without residual deficits: Secondary | ICD-10-CM

## 2019-07-19 DIAGNOSIS — R3911 Hesitancy of micturition: Secondary | ICD-10-CM | POA: Diagnosis present

## 2019-07-19 DIAGNOSIS — D6859 Other primary thrombophilia: Secondary | ICD-10-CM | POA: Diagnosis present

## 2019-07-19 DIAGNOSIS — R350 Frequency of micturition: Secondary | ICD-10-CM

## 2019-07-19 DIAGNOSIS — M1A9XX0 Chronic gout, unspecified, without tophus (tophi): Secondary | ICD-10-CM | POA: Diagnosis present

## 2019-07-19 DIAGNOSIS — N184 Chronic kidney disease, stage 4 (severe): Secondary | ICD-10-CM | POA: Diagnosis present

## 2019-07-19 DIAGNOSIS — N401 Enlarged prostate with lower urinary tract symptoms: Secondary | ICD-10-CM | POA: Diagnosis present

## 2019-07-19 DIAGNOSIS — G2581 Restless legs syndrome: Secondary | ICD-10-CM | POA: Diagnosis present

## 2019-07-19 DIAGNOSIS — E782 Mixed hyperlipidemia: Secondary | ICD-10-CM | POA: Diagnosis present

## 2019-07-19 DIAGNOSIS — Z7901 Long term (current) use of anticoagulants: Secondary | ICD-10-CM

## 2019-07-19 DIAGNOSIS — I471 Supraventricular tachycardia: Secondary | ICD-10-CM | POA: Diagnosis present

## 2019-07-19 DIAGNOSIS — E559 Vitamin D deficiency, unspecified: Secondary | ICD-10-CM | POA: Diagnosis present

## 2019-07-19 DIAGNOSIS — Z20822 Contact with and (suspected) exposure to covid-19: Secondary | ICD-10-CM | POA: Diagnosis present

## 2019-07-19 DIAGNOSIS — I071 Rheumatic tricuspid insufficiency: Secondary | ICD-10-CM | POA: Diagnosis present

## 2019-07-19 LAB — CBC
HCT: 37.4 % — ABNORMAL LOW (ref 39.0–52.0)
Hemoglobin: 11.7 g/dL — ABNORMAL LOW (ref 13.0–17.0)
MCH: 30.2 pg (ref 26.0–34.0)
MCHC: 31.3 g/dL (ref 30.0–36.0)
MCV: 96.4 fL (ref 80.0–100.0)
Platelets: 105 10*3/uL — ABNORMAL LOW (ref 150–400)
RBC: 3.88 MIL/uL — ABNORMAL LOW (ref 4.22–5.81)
RDW: 18.3 % — ABNORMAL HIGH (ref 11.5–15.5)
WBC: 7.6 10*3/uL (ref 4.0–10.5)
nRBC: 0 % (ref 0.0–0.2)

## 2019-07-19 LAB — COMPREHENSIVE METABOLIC PANEL
ALT: 74 U/L — ABNORMAL HIGH (ref 0–44)
AST: 136 U/L — ABNORMAL HIGH (ref 15–41)
Albumin: 3.4 g/dL — ABNORMAL LOW (ref 3.5–5.0)
Alkaline Phosphatase: 180 U/L — ABNORMAL HIGH (ref 38–126)
Anion gap: 13 (ref 5–15)
BUN: 50 mg/dL — ABNORMAL HIGH (ref 8–23)
CO2: 22 mmol/L (ref 22–32)
Calcium: 9.2 mg/dL (ref 8.9–10.3)
Chloride: 102 mmol/L (ref 98–111)
Creatinine, Ser: 2.69 mg/dL — ABNORMAL HIGH (ref 0.61–1.24)
GFR calc Af Amer: 24 mL/min — ABNORMAL LOW (ref >60)
GFR calc non Af Amer: 21 mL/min — ABNORMAL LOW (ref >60)
Glucose, Bld: 103 mg/dL — ABNORMAL HIGH (ref 70–99)
Potassium: 5.1 mmol/L (ref 3.5–5.1)
Sodium: 137 mmol/L (ref 135–145)
Total Bilirubin: 3 mg/dL — ABNORMAL HIGH (ref 0.3–1.2)
Total Protein: 6.3 g/dL — ABNORMAL LOW (ref 6.5–8.1)

## 2019-07-19 LAB — URINALYSIS, ROUTINE W REFLEX MICROSCOPIC
Bilirubin Urine: NEGATIVE
Glucose, UA: NEGATIVE mg/dL
Hgb urine dipstick: NEGATIVE
Ketones, ur: NEGATIVE mg/dL
Leukocytes,Ua: NEGATIVE
Nitrite: NEGATIVE
Protein, ur: 30 mg/dL — AB
Specific Gravity, Urine: 1.014 (ref 1.005–1.030)
pH: 5 (ref 5.0–8.0)

## 2019-07-19 MED ORDER — FA-PYRIDOXINE-CYANOCOBALAMIN 2.5-25-2 MG PO TABS
1.0000 | ORAL_TABLET | Freq: Every day | ORAL | Status: DC
  Administered 2019-07-19 – 2019-07-20 (×2): 1 via ORAL
  Filled 2019-07-19 (×3): qty 1

## 2019-07-19 MED ORDER — FUROSEMIDE 10 MG/ML IJ SOLN
80.0000 mg | Freq: Once | INTRAMUSCULAR | Status: AC
  Administered 2019-07-19: 80 mg via INTRAVENOUS
  Filled 2019-07-19: qty 8

## 2019-07-19 MED ORDER — FUROSEMIDE 10 MG/ML IJ SOLN
100.0000 mg | Freq: Once | INTRAVENOUS | Status: AC
  Administered 2019-07-19: 100 mg via INTRAVENOUS
  Filled 2019-07-19: qty 10

## 2019-07-19 MED ORDER — TAMSULOSIN HCL 0.4 MG PO CAPS
0.4000 mg | ORAL_CAPSULE | Freq: Every day | ORAL | Status: DC
  Administered 2019-07-19 – 2019-07-20 (×2): 0.4 mg via ORAL
  Filled 2019-07-19 (×3): qty 1

## 2019-07-19 NOTE — Progress Notes (Signed)
RN explained the safety risk to pt, concerning the bed alarm.  Pt verbalized, understanding and is refusing bed alarm during this time.  RN has notified NT and charge nurse.  Pt alert and oriented.

## 2019-07-19 NOTE — Progress Notes (Signed)
MD notified of patient's minimum output after lasix administration.

## 2019-07-19 NOTE — Progress Notes (Signed)
  Subjective:  Patient states that he has been having several "panic attacks" this morning that he controls by breathing slower. He thinks this is why he was having trouble breathing yesterday but says he is able to control them now. He states that he does not have fluid on him. States he is breathing well and has no leg swelling.  Objective:    Vital Signs (last 24 hours): Vitals:   07/19/19 0513 07/19/19 0547 07/19/19 0600 07/19/19 0733  BP: 124/86 124/86 139/85 115/80  Pulse: (!) 122 (!) 122 92 94  Resp: (!) 22 (!) 22  20  Temp:  97.6 F (36.4 C)  97.6 F (36.4 C)  TempSrc:  Oral  Oral  SpO2: 99%   100%  Weight:  90.5 kg 92.9 kg   Height:  5\' 8"  (1.727 m)      Physical Exam: General Alert and answers questions appropriately, no acute distress  Cardiac Regular rate and rhythm, no murmurs, rubs, or gallops. JVP elevated to ~ 11cm  Pulmonary Clear to auscultation bilaterally without wheezes, rhonchi, or rales  Abdominal Soft, non-tender, without distention  Extremities Trace peripheral edema    Assessment/Plan:   Active Problems:   Congestive heart failure [facility])  Patient is an 86 year old male with past medical history significant for coronary artery disease status post CABG x1 in 1990, ischemic cardiomyopathy status post ICD, paroxysmal atrial fibrillation, hypertension, hyperlipidemia, CVA, and CKD stage III who presented from his SNF on 07/18/2019 with worsening shortness of breath.  # Heart failure with reduced ejection fraction:  TTE during last admission demonstrated severely reduced EF less than 20%, newly worsening from EF 40-45% on November 2020 TTE. On this admission, patient initially presented with low-normal oxygen saturation to 91%. He received 40 mg IV lasix x1 with minimal urine output. Today, patient is saturating well at 97-100% on room air. Patient is without peripheral edema but JVD present. Patient's symptoms likely secondary to anxiety regarding illness but  would likely benefit from diuresis given volume status. *Continue midodrine 10 mg three times daily *Lasix IV 80 mg x1, will monitor for response, follow renal function closely *Patient on PO torsemide 10 mg daily - will likely benefit from higher dose at discharge *Will need outpatient PCP followup, weekly BMPs with titration of lasix  # CKD Stage III: Creatinine of 2.47 on presentation, from 2.98 on last discharge (07/12/19). Patient was started on midodrine (see above) on last admission per nephrology recommendation for hemodynamic support  # Paroxysmal A. Fib: Currently in NSR, continue home Eliquis 2.5 mg twice daily  PT/OT: Evaluated on last admission, patient progressing at SNF, plan for discharge back to SNF when improved Diet: Heart healthy DVT Ppx: Eliquis 2.5 mg twice daily for Paroxysmal a. fib Dispo: Anticipated discharge in approximately 1-2 days   [name], MD 07/19/2019, 10:52 AM

## 2019-07-20 DIAGNOSIS — Z79899 Other long term (current) drug therapy: Secondary | ICD-10-CM

## 2019-07-20 LAB — CBC
HCT: 38.3 % — ABNORMAL LOW (ref 39.0–52.0)
Hemoglobin: 12.5 g/dL — ABNORMAL LOW (ref 13.0–17.0)
MCH: 30.9 pg (ref 26.0–34.0)
MCHC: 32.6 g/dL (ref 30.0–36.0)
MCV: 94.6 fL (ref 80.0–100.0)
Platelets: 103 10*3/uL — ABNORMAL LOW (ref 150–400)
RBC: 4.05 MIL/uL — ABNORMAL LOW (ref 4.22–5.81)
RDW: 17.9 % — ABNORMAL HIGH (ref 11.5–15.5)
WBC: 7.5 10*3/uL (ref 4.0–10.5)
nRBC: 0 % (ref 0.0–0.2)

## 2019-07-20 LAB — RENAL FUNCTION PANEL
Albumin: 3.7 g/dL (ref 3.5–5.0)
Anion gap: 13 (ref 5–15)
BUN: 51 mg/dL — ABNORMAL HIGH (ref 8–23)
CO2: 22 mmol/L (ref 22–32)
Calcium: 9.6 mg/dL (ref 8.9–10.3)
Chloride: 104 mmol/L (ref 98–111)
Creatinine, Ser: 2.7 mg/dL — ABNORMAL HIGH (ref 0.61–1.24)
GFR calc Af Amer: 24 mL/min — ABNORMAL LOW (ref >60)
GFR calc non Af Amer: 20 mL/min — ABNORMAL LOW (ref >60)
Glucose, Bld: 112 mg/dL — ABNORMAL HIGH (ref 70–99)
Phosphorus: 4 mg/dL (ref 2.5–4.6)
Potassium: 4.3 mmol/L (ref 3.5–5.1)
Sodium: 139 mmol/L (ref 135–145)

## 2019-07-20 LAB — TROPONIN I (HIGH SENSITIVITY)
Troponin I (High Sensitivity): 3 ng/L (ref <18)
Troponin I (High Sensitivity): 3 ng/L (ref <18)

## 2019-07-20 LAB — MAGNESIUM: Magnesium: 2 mg/dL (ref 1.7–2.4)

## 2019-07-20 MED ORDER — TAMSULOSIN HCL 0.4 MG PO CAPS: 0.4000 mg | ORAL_CAPSULE | Freq: Every day | ORAL | 0 refills | Status: AC

## 2019-07-20 MED ORDER — TORSEMIDE 20 MG PO TABS: 20.0000 mg | ORAL_TABLET | Freq: Every day | ORAL | 0 refills | Status: DC

## 2019-07-20 NOTE — NC FL2 (Signed)
[place] LEVEL OF CARE SCREENING TOOL     IDENTIFICATION  Patient Name: Ricardo Payne Birthdate: 1931/01/15 Sex: male Admission Date (Current Location): 07/18/2019  [facility] and [place] Number:  Herbalist and Address:  [facility] [place] [place] [address] [place], [place] [address]      Provider Number: [id number]  Attending Physician Name and Address:  Oda Kilts, MD  Relative Name and Phone Number:  Karren Burly (son) [phone]    Current Level of Care: Hospital Recommended Level of Care: [place] Prior Approval Number:    Date Approved/Denied:   PASRR Number: [id number]  Discharge Plan: SNF    Current Diagnoses: Patient Active Problem List   Diagnosis Date Noted  . Heart failure with reduced ejection fraction ([place]) 07/19/2019  . Congestive heart failure ([place]) 07/18/2019  . Palliative care by specialist   . Goals of care, counseling/discussion   . Cirrhosis ([place])   . CKD (chronic kidney disease) 06/25/2019  . Head trauma 06/25/2019  . Blood thinned due to long-term anticoagulant use 06/25/2019  . Anemia 06/25/2019  . Acute kidney injury superimposed on chronic kidney disease ([place]) 05/23/2019  . Chronic combined systolic and diastolic CHF (congestive heart failure) ([place]) 05/23/2019  . Esophageal hypertension 05/23/2019  . Thrombocytopenia ([place]) 05/23/2019  . Acute on chronic HFrEF (heart failure with reduced ejection fraction) ([place]) 05/22/2019  . ICD (implantable cardioverter-defibrillator) in place 04/18/2019    Orientation RESPIRATION BLADDER Height & Weight     Self, Time, Situation, Place  Normal Continent Weight: 201 lb 12.8 oz (91.5 kg)(scale b) Height:  5\' 8"  (172.7 cm)  BEHAVIORAL SYMPTOMS/MOOD NEUROLOGICAL BOWEL NUTRITION STATUS      Continent Diet(see discharge summary)  AMBULATORY STATUS COMMUNICATION OF NEEDS Skin   Extensive Assist Verbally Other (Comment)(ecchymosis buttocks,  back and ribs)                       Personal Care Assistance Level of Assistance  Bathing, Feeding, Dressing, Total care Bathing Assistance: Limited assistance Feeding assistance: Independent Dressing Assistance: Limited assistance Total Care Assistance: Maximum assistance   Functional Limitations Info  Sight, Hearing, Speech Sight Info: Impaired Hearing Info: Adequate Speech Info: Adequate    SPECIAL CARE FACTORS FREQUENCY  PT (By licensed PT), OT (By licensed OT)     PT Frequency: min 5x weekly OT Frequency: min 5x weekly            Contractures Contractures Info: Not present    Additional Factors Info  Code Status, Allergies Code Status Info: full Allergies Info: No Known Allergies           Current Medications (07/20/2019):  This is the current hospital active medication list Current Facility-Administered Medications  Medication Dose Route Frequency Provider Last Rate Last Admin  . acetaminophen (TYLENOL) tablet 650 mg  650 mg Oral Q6H PRN Asencion Noble, MD   650 mg at 07/20/19 [id number]   Or  . acetaminophen (TYLENOL) suppository 650 mg  650 mg Rectal Q6H PRN Asencion Noble, MD      . allopurinol (ZYLOPRIM) tablet 50 mg  50 mg Oral Georga Kaufmann, MD   50 mg at 07/20/19 0932  . apixaban (ELIQUIS) tablet 2.5 mg  2.5 mg Oral BID Asencion Noble, MD   2.5 mg at 07/20/19 0932  . ferrous sulfate tablet 325 mg  325 mg Oral Q breakfast Asencion Noble, MD   [phone]  mg at 07/20/19 0933  . folic acid-pyridoxine-cyancobalamin (FOLTX) 2.5-25-2 MG per tablet 1 tablet  1 tablet Oral Daily Oda Kilts, MD   1 tablet at 07/20/19 1005  . midodrine (PROAMATINE) tablet 10 mg  10 mg Oral TID WC Asencion Noble, MD   10 mg at 07/20/19 0830  . pramipexole (MIRAPEX) tablet 0.25 mg  0.25 mg Oral BID Lonia Skinner M, MD   0.25 mg at 07/20/19 1005  . simvastatin (ZOCOR) tablet 40 mg  40 mg Oral q1800 Asencion Noble, MD   40 mg at 07/19/19 1739  .  tamsulosin (FLOMAX) capsule 0.4 mg  0.4 mg Oral Daily Chundi, Vahini, MD   0.4 mg at 07/20/19 0932     Discharge Medications: Please see discharge summary for a list of discharge medications.  Relevant Imaging Results:  Relevant Lab Results:   Additional Information SSN: [id number]  Alberteen Sam, LCSW

## 2019-07-20 NOTE — Progress Notes (Signed)
Called the [facility] to give report 5 times. First attempt I was on hold for 12 min, then no one would pick up the phone. I left my phone number for the nurse receiving the patient to call me back.

## 2019-07-20 NOTE — Discharge Summary (Addendum)
Name: Ricardo Payne MRN: [id number] DOB: May 22, 1931 86 y.o. PCP: [name], FNP  Date of Admission: 07/18/2019  1:53 PM Date of Discharge: 07/20/2019 Attending Physician: Oda Kilts, MD  Discharge Diagnosis: 1. Heart failure with reduced ejection fraction 2. CKD Stage 3  Discharge Medications: Allergies as of 07/20/2019   No Known Allergies     Medication List    TAKE these medications   allopurinol 100 MG tablet Commonly known as: ZYLOPRIM Take 0.5 tablets (50 mg total) by mouth every other day.   clotrimazole 10 MG troche Commonly known as: MYCELEX Take 1 tablet (10 mg total) by mouth 5 (five) times daily for 12 days.   Durezol 0.05 % Emul Generic drug: Difluprednate Place 1 drop into the right eye 2 (two) times daily. Until Wed, then 1 drop daily   Eliquis 2.5 MG Tabs tablet Generic drug: apixaban Take 2.5 mg by mouth 2 (two) times daily.   ergocalciferol 1.25 MG (50000 UT) capsule Commonly known as: VITAMIN D2 Take 50,000 Units by mouth once a week. Sunday   ferrous sulfate 325 (65 FE) MG tablet Take 325 mg by mouth daily with breakfast.   Folic Acid-Vit [id number] [id number] 2.5-25-1 MG Tabs tablet Commonly known as: FOLBEE Take 1 tablet by mouth daily.   midodrine 10 MG tablet Commonly known as: PROAMATINE Take 1 tablet (10 mg total) by mouth 3 (three) times daily with meals.   pramipexole 0.25 MG tablet Commonly known as: MIRAPEX Take 0.25 mg by mouth 2 (two) times daily.   simvastatin 40 MG tablet Commonly known as: ZOCOR Take 40 mg by mouth daily at 6 PM.   tamsulosin 0.4 MG Caps capsule Commonly known as: FLOMAX Take 1 capsule (0.4 mg total) by mouth daily. Start taking on: July 21, 2019   torsemide 20 MG tablet Commonly known as: DEMADEX Take 1 tablet (20 mg total) by mouth daily. What changed:   medication strength  how much to take       Disposition and follow-up:   Mr.Wane BECKETT STALLMAN was discharged from [facility] in [place] condition.  At the hospital follow up visit please address:  1.  Please assess volume status and check BMP once per week for renal function.  Patient was started on torsemide 20 mg (increased since admission) once daily at discharge and the dosage may need to be adjusted based on renal function or volume status.  Patient was started on tamsulosin for urinary frequency, please assess for symptom response.  Please continue discussions on goals of care.  Patient had discussion with palliative care on prior hospitalization and is considering what end-of-life care he would want.  Patient would benefit from follow-up with palliative care.  2.  Labs / imaging needed at time of follow-up: BMP (weekly) to check renal function and CBC to check platelet levels  3.  Pending labs/ test needing follow-up: None  Follow-up Appointments: Follow-up Information    [name], FNP Follow up.   Specialty: Family Medicine Why: Followup for weekly labs to check your kidneys and adjust your water pill dosage Contact information: [place] [address] [phone]        Donato Heinz, MD Follow up.   Specialty: Cardiology Why: Please call for a followup appointment within the next week Contact information: [address] [place] [address] [phone]        Evans Lance, MD .   Specialty:  Cardiology Contact information: [id number] [place] [address] [phone]           Hospital Course by problem list: # Heart failure with reduced ejection fraction: TTE on last admission demonstrated severely reduced EF less than 20%, newly worsening from EF 40-45% on November 2020 TTE.  On this admission, patient initially presented with low-normal oxygen saturation to 91% and shortness of breath.  Symptoms are likely secondary to anxiety regarding illness but patient had mildly elevated JVP on  presentation and was treated with diuresis.  He received 40 mg IV Lasix x1 with minimal urine output, followed by 80 mg IV Lasix with minimal output, followed by 100 mg IV Lasix.  Patient was 92.9 kg on admission, 91.5 kg on day of discharge. Patient's elevated JVP is likely in part secondary to severe tricuspid regurgitation (noted on last TTE).  Outpatient torsemide dose increased from 10 mg daily to 20 mg daily.  We recommend weekly BMPs to assess renal function and titration of torsemide to manage volume status.  During hospitalization patient did have episode of supraventricular tachycardia which remitted without medical intervention.  Patient states he has occasional bouts of tachycardia.  Patient is on optimal medical management of his heart failure, no additional agent is recommended at this time.  # CKD Stage III: Creatinine of 2.47 on presentation, from 2.98 on last discharge.  Creatinine on day of discharge was 2.70.  # Urinary frequency: Urinalysis was without signs of infection, post void residual less than 100 mL.  Patient was started on tamsulosin given symptom history.  Discharge Vitals:   BP 112/75 (BP Location: Left Arm)   Pulse 93   Temp (!) 97.4 F (36.3 C) (Oral)   Resp 18   Ht 5\' 8"  (1.727 m)   Wt 91.5 kg Comment: scale b  SpO2 91%   BMI 30.68 kg/m   Pertinent Labs, Studies, and Procedures:  CBC Latest Ref Rng & Units 07/20/2019 07/19/2019 07/18/2019  WBC 4.0 - 10.5 K/uL 7.5 7.6 8.3  Hemoglobin 13.0 - 17.0 g/dL 12.5(L) 11.7(L) 13.0  Hematocrit 39.0 - 52.0 % 38.3(L) 37.4(L) 41.9  Platelets 150 - 400 K/uL 103(L) 105(L) 121(L)   BMP Latest Ref Rng & Units 07/20/2019 07/19/2019 07/18/2019  Glucose 70 - 99 mg/dL 112(H) 103(H) 122(H)  BUN 8 - 23 mg/dL 51(H) 50(H) 47(H)  Creatinine 0.61 - 1.24 mg/dL 2.70(H) 2.69(H) 2.47(H)  BUN/Creat Ratio 10 - 24 - - -  Sodium 135 - 145 mmol/L 139 137 138  Potassium 3.5 - 5.1 mmol/L 4.3 5.1 4.7  Chloride 98 - 111 mmol/L 104 102 102  CO2 22  - 32 mmol/L 22 22 20(L)  Calcium 8.9 - 10.3 mg/dL 9.6 9.2 9.5   Troponin (07/20/19): 3 -> 3  BNP (07/18/19): 2554.9  Urinalysis    Component Value Date/Time   COLORURINE YELLOW 07/19/2019 1039   APPEARANCEUR HAZY (A) 07/19/2019 1039   LABSPEC 1.014 07/19/2019 1039   PHURINE 5.0 07/19/2019 1039   GLUCOSEU NEGATIVE 07/19/2019 1039   HGBUR NEGATIVE 07/19/2019 1039   [place] 07/19/2019 1039   [place] 07/19/2019 1039   PROTEINUR 30 (A) 07/19/2019 1039   NITRITE NEGATIVE 07/19/2019 1039   LEUKOCYTESUR NEGATIVE 07/19/2019 1039    Discharge Instructions: Discharge Instructions    Call MD for:  difficulty breathing, headache or visual disturbances   Complete by: As directed    Call MD for:  persistant dizziness or light-headedness   Complete by: As directed  Call MD for:  temperature >100.4   Complete by: As directed    Diet - low sodium heart healthy   Complete by: As directed    Discharge instructions   Complete by: As directed    You were seen in the hospital after having difficulty breathing. We gave you lasix to remove fluid and your breathing has improved. We have increased your torsemide from 10 mg daily to 20 mg daily to try and keep more fluid off. We want you to followup with your primary care doctor once a week who can check your kidney function and make decisions on increasing or decreasing your torsemide dose.  Thank you for allowing [place] to be part of your medical care!   Increase activity slowly   Complete by: As directed       Signed: [name], MD 07/20/2019, 10:39 AM

## 2019-07-20 NOTE — Progress Notes (Signed)
Subjective:  Patient seen in chair this morning.  Patient states he feels well, denies chest pain, shortness of breath.  Patient states that he occasionally has elevated heart rate and he takes a warm shower and it goes away.  Patient states he feels ready for discharge, is looking forward to going back to facility so he can get his strength back and return home.  Plan of care discussed with patient, all questions answered.  Objective:   Vital Signs (last 24 hours): Vitals:   07/20/19 0031 07/20/19 0100 07/20/19 0101 07/20/19 0353  BP: 125/89  (!) 133/92 112/75  Pulse: (!) 133  (!) 132 93  Resp:    18  Temp:    (!) 97.4 F (36.3 C)  TempSrc:    Oral  SpO2: 100%  93% 91%  Weight:  91.5 kg    Height:       Physical Exam: General Alert and answers questions appropriately, no acute distress  Cardiac Regular rate and rhythm, no murmurs, rubs, or gallops  Pulmonary Clear to auscultation bilaterally without wheezes, rhonchi, or rales  Extremities No peripheral edema    CBC Latest Ref Rng & Units 07/20/2019 07/19/2019 07/18/2019  WBC 4.0 - 10.5 K/uL 7.5 7.6 8.3  Hemoglobin 13.0 - 17.0 g/dL 12.5(L) 11.7(L) 13.0  Hematocrit 39.0 - 52.0 % 38.3(L) 37.4(L) 41.9  Platelets 150 - 400 K/uL 103(L) 105(L) 121(L)   BMP Latest Ref Rng & Units 07/20/2019 07/19/2019 07/18/2019  Glucose 70 - 99 mg/dL 112(H) 103(H) 122(H)  BUN 8 - 23 mg/dL 51(H) 50(H) 47(H)  Creatinine 0.61 - 1.24 mg/dL 2.70(H) 2.69(H) 2.47(H)  BUN/Creat Ratio 10 - 24 - - -  Sodium 135 - 145 mmol/L 139 137 138  Potassium 3.5 - 5.1 mmol/L 4.3 5.1 4.7  Chloride 98 - 111 mmol/L 104 102 102  CO2 22 - 32 mmol/L 22 22 20(L)  Calcium 8.9 - 10.3 mg/dL 9.6 9.2 9.5   Magnesium: 2.0  I/Os: +34 ml Body Mass: 92.9 kg -> 91.5 kg  Urinalysis    Component Value Date/Time   COLORURINE YELLOW 07/19/2019 1039   APPEARANCEUR HAZY (A) 07/19/2019 1039   LABSPEC 1.014 07/19/2019 1039   PHURINE 5.0 07/19/2019 1039   GLUCOSEU NEGATIVE 07/19/2019  1039   [place] 07/19/2019 1039   [place] 07/19/2019 1039   [place] 07/19/2019 1039   PROTEINUR 30 (A) 07/19/2019 1039   NITRITE NEGATIVE 07/19/2019 1039   LEUKOCYTESUR NEGATIVE 07/19/2019 1039     Assessment/Plan:   Principal Problem:   Acute on chronic HFrEF (heart failure with reduced ejection fraction) (HCC) Active Problems:   CKD (chronic kidney disease)   Congestive heart failure (HCC)   Heart failure with reduced ejection fraction [facility])  Patient is an 86 year old male with past medical history significant for coronary artery disease status post CABGx1 in 1990, ischemic cardiomyopathy status post ICD, paroxysmal atrial fibrillation, hypertension, hyperlipidemia, CVA, and CKD stage III who presented from his SNF on 07/18/2019 with worsening shortness of breath.  # Heart failure with reduced ejection fraction: TTE during last admission demonstrated severely reduced EF less than 20%, newly worsening from EF 40-45% on November 2020 TTE. On this admission, patient initially presented with low-normal oxygen saturation at 91%. Patient was with increased pulmonary congestion on CXR, elevated BNP and JVD. Patient has been saturating well, breathing comfortable on room air. Patient received 40 mg IV lasix on presentation, 80 mg IV yesterday afternoon, then 100 mg IV yesterday evening. He has had  net +37 ml recorded but patient body mass 91.5 kg from 92.9 kg yesterday * Continue midodrine 10 mg three times daily * Will discharge on torsemide 20 mg daily - was previously on 10 mg daily * Will need outpatient PCP follow-up, weekly BMPs for titration of Lasix  # CKD Stage III: Creatinine of 2.47 on presentation, from 2.98 on last discharge (07/12/2019).  Creatinine increased to 2.69 on day following admission and today is stable at 2.70  # Paroxysmal A. Fib: Currently in NSR, continue home Eliquis 2.5 mg twice daily  # Urinary frequency: Patient reports urinary  frequency and hesitancy.  Urinalysis without evidence for infection, low postvoid residuals (65ml), patient started on tamsulosin.  PT/OT: Evaluate on last admission, patient progressing at SNF, plan for discharge back to SNF when improved Diet: Heart healthy DVT Ppx: Eliquis 2.5 mg twice daily for paroxysmal A. Fib Admit Status: Inpatient Dispo: Anticipated discharge today  [name], MD 07/20/2019, 6:46 AM

## 2019-07-20 NOTE — TOC Transition Note (Addendum)
Transition of Care [facility]) - CM/SW Discharge Note   Patient Details  Name: EVERTON FORSTNER MRN: [id number] Date of Birth: 03-Nov-1930  Transition of Care [facility]) CM/SW Contact:  Alberteen Sam, LCSW Phone Number: 07/20/2019, 10:46 AM   Clinical Narrative:     CSW spoke with patient confirmed he is agreeable to return to [place] today.   Patient will DC to: [facility] Anticipated DC date: 07/20/2019 Family notified:n/a patient will notify Transport [id number]  Per MD patient ready for DC to [facility]. RN, patient, patient's family, and facility notified of DC. Discharge Summary sent to facility. RN given number for report [phone] Room 113 . DC packet on chart. Ambulance transport requested for patient.  CSW signing off.  [place], [place]   Final next level of care: Skilled Nursing Facility Barriers to Discharge: No Barriers Identified   Patient Goals and CMS Choice Patient states their goals for this hospitalization and ongoing recovery are:: to go back to [place] CMS Medicare.gov Compare Post Acute Care list provided to:: Patient Choice offered to / list presented to : Patient  Discharge Placement PASRR number recieved: 07/20/19            Patient chooses bed at: ([facility]) Patient to be transferred to facility by: [place] Name of family member notified: n/a    Discharge Plan and Services                                     Social Determinants of Health (SDOH) Interventions     Readmission Risk Interventions No flowsheet data found.

## 2019-07-20 NOTE — Progress Notes (Signed)
Report given to SNF nurse

## 2019-07-20 NOTE — Progress Notes (Signed)
Patient seen at the bedside after being paged for a widened QRS complex and tachycardia detected on telemetry.  The patient appeared comfortable and was alert and oriented.  He states that he has some soreness in his lower neck and bilateral shoulders from sleeping funny.  It started when he woke up from his sleep an hour ago. The patient denies having any chest pain or shortness of breath at this time. On exam, patient has normal S1-S2 no murmurs rubs or gallops appreciated.  Peripheral pulses intact in all extremities with trace lower extremity edema. Lungs are clear to auscultation bilaterally, no crackles or wheezes appreciated.  Patient is alert and oriented responds all questions appropriately.  An EKG was ordered which demonstrated wide QRS tachycardia to 135.  Also T wave inversions in leads I and aVL.  When compared to previous EKG, the T wave inversions are more prominent, and the QRS complexes are wider.  We advised the patient to let someone know if he begins to have chest pain radiates to his arm or shortness of breath.  Patient voiced understanding and requested to take a hot shower.  He may do so with the assistance of a nurse.  Plan -CBC -Troponin level -Magnesium level -Renal function panel  Earlene Plater, MD Internal Medicine, PGY1 Pager: [phone]  07/20/2019,12:44 AM

## 2019-07-27 ENCOUNTER — Telehealth: Payer: Self-pay | Admitting: Cardiology

## 2019-07-27 NOTE — Telephone Encounter (Signed)
Faxed received with prescriptions:  Rosuvastatin 10 mg daily Allopurinol 100 mg (0.5 tabley qod) Ergocalciferol capsule [id number] mg daily Folic [id number] once daily tamsulosin 0.4 mg daily Eliquis 2.5 mg BID Ferrous sulfate 325mg  daily pramipexole dihydrochloride 0.25mg  BID Torsemide 20 mg daily Midodrine 10 mg TID Ventolin HFA 2 puffs q 4 PRN   Spoke to son, advised only difference in cardiac medication is he is no longer on simvastatin and now on rosuvastatin.    Son aware and verbalized understanding.

## 2019-07-27 NOTE — Telephone Encounter (Signed)
Returned call to son (ok per DPR)-he states he was discharged from facility and was concerned about his medications.  He request to fax a copy of his medications for [place] to review.   He does have appt next week but is concerned about from now to then.      Will await fax

## 2019-07-27 NOTE — Telephone Encounter (Signed)
The patient's son Karren Burly is calling requesting a nurse give him a call to discuss the patient's current list of medications since being released from the rehab facility. He states he printed a copy off before he got the medications filled and he noticed some things have been changed around. Please advise.

## 2019-07-30 ENCOUNTER — Ambulatory Visit: Payer: Medicare Other

## 2019-08-03 ENCOUNTER — Ambulatory Visit (INDEPENDENT_AMBULATORY_CARE_PROVIDER_SITE_OTHER): Payer: Medicare Other | Admitting: Cardiology

## 2019-08-03 ENCOUNTER — Other Ambulatory Visit: Payer: Self-pay

## 2019-08-03 VITALS — BP 120/60 | HR 82 | Temp 97.3°F | Ht 68.0 in | Wt 216.2 lb

## 2019-08-03 DIAGNOSIS — I10 Essential (primary) hypertension: Secondary | ICD-10-CM

## 2019-08-03 DIAGNOSIS — I480 Paroxysmal atrial fibrillation: Secondary | ICD-10-CM | POA: Diagnosis not present

## 2019-08-03 DIAGNOSIS — Z79899 Other long term (current) drug therapy: Secondary | ICD-10-CM

## 2019-08-03 DIAGNOSIS — I5042 Chronic combined systolic (congestive) and diastolic (congestive) heart failure: Secondary | ICD-10-CM

## 2019-08-03 DIAGNOSIS — I2510 Atherosclerotic heart disease of native coronary artery without angina pectoris: Secondary | ICD-10-CM

## 2019-08-03 LAB — BASIC METABOLIC PANEL
BUN/Creatinine Ratio: 27 — ABNORMAL HIGH (ref 10–24)
BUN: 57 mg/dL — ABNORMAL HIGH (ref 8–27)
CO2: 22 mmol/L (ref 20–29)
Calcium: 9.3 mg/dL (ref 8.6–10.2)
Chloride: 99 mmol/L (ref 96–106)
Creatinine, Ser: 2.11 mg/dL — ABNORMAL HIGH (ref 0.76–1.27)
GFR calc Af Amer: 32 mL/min/{1.73_m2} — ABNORMAL LOW (ref >59)
GFR calc non Af Amer: 28 mL/min/{1.73_m2} — ABNORMAL LOW (ref >59)
Glucose: 89 mg/dL (ref 65–99)
Potassium: 3.8 mmol/L (ref 3.5–5.2)
Sodium: 140 mmol/L (ref 134–144)

## 2019-08-03 MED ORDER — MIDODRINE HCL 10 MG PO TABS: 10.0000 mg | ORAL_TABLET | Freq: Two times a day (BID) | ORAL | 0 refills | Status: DC

## 2019-08-03 NOTE — Patient Instructions (Signed)
Medication Instructions:  DECREASE midodrine to 10 mg TWO times daily ---if you start noticing lightheadedness or dizziness, please call the office.   *If you need a refill on your cardiac medications before your next appointment, please call your pharmacy*   Lab Work: BMET today  If you have labs (blood work) drawn today and your tests are completely normal, you will receive your results only by: Marland Kitchen MyChart Message (if you have MyChart) OR . A paper copy in the mail If you have any lab test that is abnormal or we need to change your treatment, we will call you to review the results.   Testing/Procedures: NONE  Follow-Up: At [facility], you and your health needs are our priority.  As part of our continuing mission to provide you with exceptional heart care, we have created designated Provider Care Teams.  These Care Teams include your primary Cardiologist (physician) and Advanced Practice Providers (APPs -  Physician Assistants and Nurse Practitioners) who all work together to provide you with the care you need, when you need it.  We recommend signing up for the patient portal called "MyChart".  Sign up information is provided on this After Visit Summary.  MyChart is used to connect with patients for Virtual Visits (Telemedicine).  Patients are able to view lab/test results, encounter notes, upcoming appointments, etc.  Non-urgent messages can be sent to your provider as well.   To learn more about what you can do with MyChart, go to [email or web address].    Your next appointment:   3-4 week(s)  The format for your next appointment:   In Person  Provider:   Oswaldo Milian, MD   Other Instructions Please monitor blood pressure two times daily at [place] with readings for Dr. Gardiner Rhyme to review

## 2019-08-03 NOTE — Progress Notes (Signed)
Cardiology Office Note:    Date:  08/05/2019   ID:  Ricardo Payne, DOB May 08, 1931, MRN [id number]  PCP:  [name], FNP  Cardiologist:  Donato Heinz, MD  Electrophysiologist:  Cristopher Peru, MD   Referring MD: Jillyn Ledger, FNP  No chief complaint on file.   History of Present Illness:    Ricardo Payne is a 86 y.o. male with a hx of CAD status post CABG x1 in 1990, ischemic cardiomyopathy status post ICD 02/2018, atrial fibrillation, hypertension, hyperlipidemia, CVA 07/2016, CKD stage III who presents for follow-up.  He was seen initially on 03/26/2019.  He had moved from [place] to [place] in September 2020.  He reported no issues, states that he had had issues with lower extremity edema earlier in the year but started on Bumex 1 mg daily in May and symptoms have been well controlled.  TTE from 07/2018 in [place] showed EF 25 to 30%.  TTE here in 04/2019 showed improvement in EF to 40 to 45%.  At initial appointment he was taking carvedilol 625 mg twice daily and Aldactone 25 mg daily.  Records from his cardiologist were obtained and indicated that he was not on an ACE/ARB/Entresto due to CKD (baseline creatinine around 2.0).  Records from cardiologist indicated that spironolactone was tried and discontinued secondary to hyperkalemia.  However he was still taking when seen in clinic.  Labs showed creatinine 2.04, potassium 5.1; he was told to discontinue his spironolactone.  He presented to the clinic on 05/18/2019 with worsening shortness of breath and lower extremity edema.  His Bumex was increased to 2 mg twice daily.  Follow-up appointment on 12/21 with Jory Sims, he reported significant improvement in his symptoms.  However his renal function was worsening, and he was recommended to go to the ER.  He was briefly admitted to the hospital, Bumex was held, and creatinine improved.  Since last clinic visit, he has had 2 admissions to [facility].  He was admitted from  2531/04/21 with acute on chronic HFrEF.  He had presented with worsening shortness of breath and lower extremity edema.  He received IV diuresis.  Repeat TTE that admission showed EF less than 20%.  He had low blood pressures throughout admission and was started on midodrine 10 mg 3 times daily hemodynamic support.  He was discharged on torsemide 10 mg daily.  Creatinine peaked at 4.7 during admission, was improved to 2.98 on discharge.  He was admitted again from 2/17 through 07/20/2019 with decompensated heart failure.  He received IV diuresis and his home torsemide dose was increased to 20 mg daily.  Creatinine was 2.7 on discharge.  He remained on midodrine for his blood pressure.  Today he reports that he has been doing better.  States that his breathing is improved.  He was in a facility for 3 weeks after his discharge but is now home.  Has his first appointment with nephrology next week.  Reports his lower extremity edema has improved.  He is taking torsemide 20 mg daily.    Past Medical History:  Diagnosis Date  . Acquired thrombophilia ([place])   . Age-related cataract of both eyes   . ASHD (arteriosclerotic heart disease)   . Benign prostatic hyperplasia   . CAD (coronary artery disease)    a. s/p CABG x1 in 1990  . Cerebellar stroke ([place]) 07/02/2016  . Chronic combined systolic and diastolic CHF (congestive heart failure) ([place])   . Chronic gout without tophus   .  CKD (chronic kidney disease), stage IV ([place])   . Essential hypertension   . ICD (implantable cardioverter-defibrillator) in place   . Ischemic cardiomyopathy   . Mixed hyperlipidemia   . PAF (paroxysmal atrial fibrillation) ([place])   . Restless legs   . Secondary pulmonary arterial hypertension ([place])   . Thrombocytopenia ([place])   . Vitamin D deficiency     Past Surgical History:  Procedure Laterality Date  . CHOLECYSTECTOMY    . CORONARY ARTERY BYPASS GRAFT    . PACEMAKER IMPLANT  10/2018    Current Medications: No  outpatient medications have been marked as taking for the 08/03/19 encounter (Office Visit) with Donato Heinz, MD.     Allergies:   Patient has no known allergies.   Social History   Socioeconomic History  . Marital status: Widowed    Spouse name: Not on file  . Number of children: 2  . Years of education: Not on file  . Highest education level: Not on file  Occupational History  . Not on file  Tobacco Use  . Smoking status: Former Smoker    Quit date: 1988    Years since quitting: 33.2  . Smokeless tobacco: Never Used  Substance and Sexual Activity  . Alcohol use: Yes    Comment: rare  . Drug use: Never  . Sexual activity: Not on file  Other Topics Concern  . Not on file  Social History Narrative  . Not on file   Social Determinants of Health   Financial Resource Strain:   . Difficulty of Paying Living Expenses: Not on file  Food Insecurity:   . Worried About Charity fundraiser in the Last Year: Not on file  . Ran Out of Food in the Last Year: Not on file  Transportation Needs:   . Lack of Transportation (Medical): Not on file  . Lack of Transportation (Non-Medical): Not on file  Physical Activity:   . Days of Exercise per Week: Not on file  . Minutes of Exercise per Session: Not on file  Stress:   . Feeling of Stress : Not on file  Social Connections:   . Frequency of Communication with Friends and Family: Not on file  . Frequency of Social Gatherings with Friends and Family: Not on file  . Attends Religious Services: Not on file  . Active Member of Clubs or Organizations: Not on file  . Attends Archivist Meetings: Not on file  . Marital Status: Not on file     Family History: The patient's family history includes Heart attack in his brother; Heart disease in his father; Hypertension in his mother; Suicidality in his mother; Valvular heart disease in his father.  ROS:   Please see the history of present illness.     All other systems  reviewed and are negative.  EKGs/Labs/Other Studies Reviewed:    The following studies were reviewed today:   EKG:  EKG is ordered today.  The ekg ordered today demonstrates sinus rhythm, PVC, rate 82, nonspecific T wave flattening  TTE 07/09/19: 1. Left ventricular ejection fraction, by estimation, is <20%. The left  ventricle has severely decreased function. The left ventrical demonstrates  global hypokinesis. The left ventricular internal cavity size was mildly  dilated. Left ventricular  diastolic parameters are indeterminate.  2. Right ventricular systolic function is moderately reduced. The right  ventricular size is mildly enlarged. There is moderately elevated  pulmonary artery systolic pressure. The estimated right ventricular  systolic  pressure is 46.8 mmHg.  3. Mild to moderate mitral valve regurgitation.  4. Tricuspid valve regurgitation is severe.  5. The aortic valve is tricuspid. Mild aortic valve stenosis, mean  gradient only 6 mmHg but visually at least mild AS. No aortic  insufficiency.  6. Aortic dilatation noted. There is mild dilatation of the aortic root  measuring 41 mm.  7. Dilated IVC suggesting RA pressure 15 mmHg.  8. Rhythm difficult to discern, may be atrial fibrillation.   TTE 04/02/19:  1. Left ventricular ejection fraction, by visual estimation, is 40 to 45%. The left ventricle has moderately decreased function. Left ventricular septal wall thickness was normal. Mildly increased left ventricular posterior wall thickness. There is  borderline left ventricular hypertrophy.  2. Inferior and inferoseptal akinesis and thinning.  3. Elevated left atrial and left ventricular end-diastolic pressures.  4. Left ventricular diastolic parameters are consistent with Grade III diastolic dysfunction (restrictive).  5. Global right ventricle has mildly reduced systolic function.The right ventricular size is normal. No increase in right ventricular wall  thickness.  6. Left atrial size was severely dilated.  7. Right atrial size was moderately dilated.  8. The mitral valve is abnormal. Mild mitral valve regurgitation.  9. The tricuspid valve is grossly normal. Tricuspid valve regurgitation moderate. 10. Aortic valve area, by VTI measures 1.60 cm. 11. Aortic valve mean gradient measures 8.0 mmHg. 12. Aortic valve peak gradient measures 13.1 mmHg. 13. The aortic valve is tricuspid. Aortic valve regurgitation is trivial. Mild aortic valve stenosis. 14. The pulmonic valve was grossly normal. Pulmonic valve regurgitation is mild. 15. Moderately elevated pulmonary artery systolic pressure. 16. The inferior vena cava is dilated in size with >50% respiratory variability, suggesting right atrial pressure of 8 mmHg. 17. The average left ventricular global longitudinal strain is -10.7 %. 18. A ICD is visualized.  Recent Labs: 05/21/2019: NT-Pro BNP 6,191 07/06/2019: TSH 1.051 07/18/2019: B Natriuretic Peptide 2,554.9 07/19/2019: ALT 74 07/20/2019: Hemoglobin 12.5; Magnesium 2.0; Platelets 103 08/03/2019: BUN 57; Creatinine, Ser 2.11; Potassium 3.8; Sodium 140  Recent Lipid Panel    Component Value Date/Time   CHOL 144 03/26/2019 1425   TRIG 70 03/26/2019 1425   HDL 71 03/26/2019 1425   CHOLHDL 2.0 03/26/2019 1425   LDLCALC 59 03/26/2019 1425    Physical Exam:    VS:  BP 120/60   Pulse 82   Temp (!) 97.3 F (36.3 C)   Ht 5\' 8"  (1.727 m)   Wt 216 lb 3.2 oz (98.1 kg)   SpO2 98%   BMI 32.87 kg/m     Wt Readings from Last 3 Encounters:  08/03/19 216 lb 3.2 oz (98.1 kg)  07/20/19 201 lb 12.8 oz (91.5 kg)  07/12/19 199 lb 8.3 oz (90.5 kg)     GEN:  in no acute distress HEENT: Normal NECK:+ JVD LYMPHATICS: No lymphadenopathy CARDIAC: RRR, no murmurs, rubs, gallops RESPIRATORY:  Clear to auscultation without rales, wheezing or rhonchi  ABDOMEN: Soft, non-tender, non-distended MUSCULOSKELETAL:  1+ LE edema SKIN: Warm and  dry NEUROLOGIC:  Alert and oriented x 3 PSYCHIATRIC:  Normal affect   ASSESSMENT:    1. Chronic combined systolic and diastolic CHF (congestive heart failure) ([place])   2. Medication management   3. Paroxysmal atrial fibrillation ([facility])   4. Essential hypertension   5. Coronary artery disease involving native coronary artery of native heart without angina pectoris    PLAN:     Chronic systolic and diastolic heart failure: ischemic cardiomyopathy.  EF <20%.  On torsemide 20 mg daily.  Status post AICD.  He is not on any heart failure meds, as due to hypotension during recent admission, heart failure meds were discontinued and he was started on midodrine 10 mg 3 times daily.  BP 120/60 in clinic today -Continue torsemide 20 mg daily.  Will check BMET -Given improvement in BP, will try to wean midodrine.  Will decrease dose to 10 mg twice daily  CAD: Status post CABG x1 in 1990.  Denies any chest pain  -Continue aspirin, beta-blocker -Currently on simvastatin 40 mg daily, LDL 59, at goal less than 70  Hypertension: Previously was on carvedilol, but given hypotension during recent admission was discontinued and started on midodrine.  Given improvement in BP, will wean midodrine as above  Atrial fibrillation: On Eliquis 2.5 mg twice daily.  CHADS-VASc 7 - Continue Eliquis 2.5 mg BID, which is an appropriate dose for him given age greater than 35 and creatinine greater than 1.5  Follow-up in 3-4 weeks   Medication Adjustments/Labs and Tests Ordered: Current medicines are reviewed at length with the patient today.  Concerns regarding medicines are outlined above.  Orders Placed This Encounter  Procedures  . Basic metabolic panel  . EKG 12-Lead   Meds ordered this encounter  Medications  . midodrine (PROAMATINE) 10 MG tablet    Sig: Take 1 tablet (10 mg total) by mouth 2 (two) times daily.    Dispense:  90 tablet    Refill:  0    Patient Instructions  Medication Instructions:   DECREASE midodrine to 10 mg TWO times daily ---if you start noticing lightheadedness or dizziness, please call the office.   *If you need a refill on your cardiac medications before your next appointment, please call your pharmacy*   Lab Work: BMET today  If you have labs (blood work) drawn today and your tests are completely normal, you will receive your results only by: Marland Kitchen MyChart Message (if you have MyChart) OR . A paper copy in the mail If you have any lab test that is abnormal or we need to change your treatment, we will call you to review the results.   Testing/Procedures: NONE  Follow-Up: At [facility], you and your health needs are our priority.  As part of our continuing mission to provide you with exceptional heart care, we have created designated Provider Care Teams.  These Care Teams include your primary Cardiologist (physician) and Advanced Practice Providers (APPs -  Physician Assistants and Nurse Practitioners) who all work together to provide you with the care you need, when you need it.  We recommend signing up for the patient portal called "MyChart".  Sign up information is provided on this After Visit Summary.  MyChart is used to connect with patients for Virtual Visits (Telemedicine).  Patients are able to view lab/test results, encounter notes, upcoming appointments, etc.  Non-urgent messages can be sent to your provider as well.   To learn more about what you can do with MyChart, go to [email or web address].    Your next appointment:   3-4 week(s)  The format for your next appointment:   In Person  Provider:   Oswaldo Milian, MD   Other Instructions Please monitor blood pressure two times daily at [place] with readings for Dr. Gardiner Rhyme to review     Signed, Donato Heinz, MD  08/05/2019 6:18 PM    [place]

## 2019-08-04 ENCOUNTER — Telehealth: Payer: Self-pay | Admitting: Physician Assistant

## 2019-08-04 MED ORDER — ROSUVASTATIN CALCIUM 10 MG PO TABS: 10.0000 mg | ORAL_TABLET | Freq: Every day | ORAL | 3 refills | Status: DC

## 2019-08-04 NOTE — Telephone Encounter (Signed)
Step son, Lavell Anchors, called. Incorrect statin is listed in his meds. Patient is on Rosuvastatin 10 mg once daily, not Simvastatin 40 mg. I have adjusted his chart to reflect the correct medication. Richardson Dopp, PA-C    08/04/2019 1:02 PM

## 2019-08-07 ENCOUNTER — Encounter: Payer: Self-pay | Admitting: *Deleted

## 2019-08-13 ENCOUNTER — Ambulatory Visit: Payer: 59 | Admitting: Cardiology

## 2019-08-13 ENCOUNTER — Other Ambulatory Visit: Payer: Self-pay

## 2019-08-13 ENCOUNTER — Encounter: Payer: Self-pay | Admitting: Cardiology

## 2019-08-13 ENCOUNTER — Ambulatory Visit (INDEPENDENT_AMBULATORY_CARE_PROVIDER_SITE_OTHER): Payer: Medicare Other | Admitting: Cardiology

## 2019-08-13 ENCOUNTER — Telehealth: Payer: Self-pay | Admitting: Cardiology

## 2019-08-13 VITALS — BP 114/66 | HR 86 | Temp 96.8°F | Ht 68.0 in | Wt 226.0 lb

## 2019-08-13 DIAGNOSIS — R609 Edema, unspecified: Secondary | ICD-10-CM | POA: Diagnosis not present

## 2019-08-13 DIAGNOSIS — I5043 Acute on chronic combined systolic (congestive) and diastolic (congestive) heart failure: Secondary | ICD-10-CM

## 2019-08-13 DIAGNOSIS — R0602 Shortness of breath: Secondary | ICD-10-CM | POA: Diagnosis not present

## 2019-08-13 DIAGNOSIS — N184 Chronic kidney disease, stage 4 (severe): Secondary | ICD-10-CM

## 2019-08-13 DIAGNOSIS — Z79899 Other long term (current) drug therapy: Secondary | ICD-10-CM

## 2019-08-13 MED ORDER — TORSEMIDE 20 MG PO TABS: 40.0000 mg | ORAL_TABLET | Freq: Two times a day (BID) | ORAL | 3 refills | Status: DC

## 2019-08-13 NOTE — Patient Instructions (Addendum)
Medication Instructions:  INCREASE torsemide to 40 mg two times daily ----Call tomorrow to update [place] on swelling, shortness of breath, how you are feeling.    ----If symptoms worse, please go to ER or call 911  *If you need a refill on your cardiac medications before your next appointment, please call your pharmacy*   Lab Work: TODAY (CMET, CBC, Mag, BNP)  If you have labs (blood work) drawn today and your tests are completely normal, you will receive your results only by: Marland Kitchen MyChart Message (if you have MyChart) OR . A paper copy in the mail If you have any lab test that is abnormal or we need to change your treatment, we will call you to review the results.   Testing/Procedures: NONE  Follow-Up: At [facility], you and your health needs are our priority.  As part of our continuing mission to provide you with exceptional heart care, we have created designated Provider Care Teams.  These Care Teams include your primary Cardiologist (physician) and Advanced Practice Providers (APPs -  Physician Assistants and Nurse Practitioners) who all work together to provide you with the care you need, when you need it.  We recommend signing up for the patient portal called "MyChart".  Sign up information is provided on this After Visit Summary.  MyChart is used to connect with patients for Virtual Visits (Telemedicine).  Patients are able to view lab/test results, encounter notes, upcoming appointments, etc.  Non-urgent messages can be sent to your provider as well.   To learn more about what you can do with MyChart, go to [email or web address].    Your next appointment:   Wednesday 3/24 at 11:40 AM with Dr. Gardiner Rhyme

## 2019-08-13 NOTE — Telephone Encounter (Signed)
PT called and said his genitals are enlarged and he does not know what to do.

## 2019-08-13 NOTE — Telephone Encounter (Signed)
The patient called in with reports of increased swelling in his genitals and legs. He is having shortness of breath. He denies pain or discoloration. He has gained 4 pounds in 6 days, current weight is 219.  He is currently on Torsemide 40 mg once daily. This has been updated in his chart.  An appointment has been made for today with Dr. Gardiner Rhyme.

## 2019-08-13 NOTE — Progress Notes (Signed)
Cardiology Office Note:    Date:  08/14/2019   ID:  Ricardo Payne, DOB 27-Jul-1930, MRN [id number]  PCP:  [name], FNP  Cardiologist:  Donato Heinz, MD  Electrophysiologist:  Cristopher Peru, MD   Referring MD: Jillyn Ledger, FNP  Chief Complaint  Patient presents with  . Shortness of Breath    History of Present Illness:    Ricardo Payne is a 86 y.o. male with a hx of CAD status post CABG x1 in 1990, ischemic cardiomyopathy status post ICD 02/2018, atrial fibrillation, hypertension, hyperlipidemia, CVA 07/2016, CKD stage III who presents for follow-up.  He was seen initially on 03/26/2019.  He had moved from [place] to [place] in September 2020.  He reported no issues, states that he had had issues with lower extremity edema earlier in the year but started on Bumex 1 mg daily in May and symptoms have been well controlled.  TTE from 07/2018 in [place] showed EF 25 to 30%.  TTE here in 04/2019 showed improvement in EF to 40 to 45%.  At initial appointment he was taking carvedilol 625 mg twice daily and Aldactone 25 mg daily.  Records from his cardiologist were obtained and indicated that he was not on an ACE/ARB/Entresto due to CKD (baseline creatinine around 2.0).  Records from cardiologist indicated that spironolactone was tried and discontinued secondary to hyperkalemia.  However he was still taking when seen in clinic.  Labs showed creatinine 2.04, potassium 5.1; he was told to discontinue his spironolactone.  He presented to the clinic on 05/18/2019 with worsening shortness of breath and lower extremity edema.  His Bumex was increased to 2 mg twice daily.  Follow-up appointment on 12/21 with Jory Sims, he reported significant improvement in his symptoms.  However his renal function was worsening, and he was recommended to go to the ER.  He was briefly admitted to the hospital, Bumex was held, and creatinine improved.  Since last clinic visit, he has had 2  admissions to [facility].  He was admitted from 2531/04/21 with acute on chronic HFrEF.  He had presented with worsening shortness of breath and lower extremity edema.  He received IV diuresis.  Repeat TTE that admission showed EF less than 20%.  He had low blood pressures throughout admission and was started on midodrine 10 mg 3 times daily hemodynamic support.  He was discharged on torsemide 10 mg daily.  Creatinine peaked at 4.7 during admission, was improved to 2.98 on discharge.  He was admitted again from 2/17 through 07/20/2019 with decompensated heart failure.  He received IV diuresis and his home torsemide dose was increased to 20 mg daily.  Creatinine was 2.7 on discharge.  He remained on midodrine for his blood pressure.  He was seen in clinic on 08/03/2019.  At that time he reported that his dyspnea edema had improved.  Labs showed improvement in renal function (creatinine 2.7->2.1).  BP had improved, midodrine was weaned to 10 mg twice daily.  He saw Dr. Justin Mend in nephrology on 08/07/2019, and his torsemide was increased to 20 mg BID.  He reports that over the last 10 days he has noticed significant increase in his edema.  Weight is up 10 pounds.  Has been taking torsemide but no improvement.  Notes significant testicular and lower extremity swelling.  Reports mild shortness of breath.    Past Medical History:  Diagnosis Date  . Acquired thrombophilia ([place])   . Age-related cataract of both eyes   .  ASHD (arteriosclerotic heart disease)   . Benign prostatic hyperplasia   . CAD (coronary artery disease)    a. s/p CABG x1 in 1990  . Cerebellar stroke ([place]) 07/02/2016  . Chronic combined systolic and diastolic CHF (congestive heart failure) ([place])   . Chronic gout without tophus   . CKD (chronic kidney disease), stage IV ([place])   . Essential hypertension   . ICD (implantable cardioverter-defibrillator) in place   . Ischemic cardiomyopathy   . Mixed hyperlipidemia   . PAF (paroxysmal atrial  fibrillation) ([place])   . Restless legs   . Secondary pulmonary arterial hypertension ([place])   . Thrombocytopenia ([place])   . Vitamin D deficiency     Past Surgical History:  Procedure Laterality Date  . CHOLECYSTECTOMY    . CORONARY ARTERY BYPASS GRAFT    . PACEMAKER IMPLANT  10/2018    Current Medications: Current Meds  Medication Sig  . allopurinol (ZYLOPRIM) 100 MG tablet Take 0.5 tablets (50 mg total) by mouth every other day.  Marland Kitchen apixaban (ELIQUIS) 2.5 MG TABS tablet Take 2.5 mg by mouth 2 (two) times daily.  . ergocalciferol (VITAMIN D2) 1.25 MG (50000 UT) capsule Take 50,000 Units by mouth once a week. Sunday  . Folic Acid-Vit A2-ZHY Q65 (FOLBEE) 2.5-25-1 MG TABS tablet Take 1 tablet by mouth daily.  . midodrine (PROAMATINE) 10 MG tablet Take 1 tablet (10 mg total) by mouth 2 (two) times daily.  . pramipexole (MIRAPEX) 0.25 MG tablet Take 0.25 mg by mouth 2 (two) times daily.  . rosuvastatin (CRESTOR) 10 MG tablet Take 1 tablet (10 mg total) by mouth daily.  . tamsulosin (FLOMAX) 0.4 MG CAPS capsule Take 1 capsule (0.4 mg total) by mouth daily.  Marland Kitchen torsemide (DEMADEX) 20 MG tablet Take 2 tablets (40 mg total) by mouth 2 (two) times daily.  . [DISCONTINUED] torsemide (DEMADEX) 20 MG tablet Take 1 tablet (20 mg total) by mouth daily. (Patient taking differently: Take 40 mg by mouth daily. )     Allergies:   Patient has no known allergies.   Social History   Socioeconomic History  . Marital status: Widowed    Spouse name: Not on file  . Number of children: 2  . Years of education: Not on file  . Highest education level: Not on file  Occupational History  . Not on file  Tobacco Use  . Smoking status: Former Smoker    Quit date: 1988    Years since quitting: 33.2  . Smokeless tobacco: Never Used  Substance and Sexual Activity  . Alcohol use: Yes    Comment: rare  . Drug use: Never  . Sexual activity: Not on file  Other Topics Concern  . Not on file  Social History  Narrative  . Not on file   Social Determinants of Health   Financial Resource Strain:   . Difficulty of Paying Living Expenses:   Food Insecurity:   . Worried About Charity fundraiser in the Last Year:   . Arboriculturist in the Last Year:   Transportation Needs:   . Film/video editor (Medical):   Marland Kitchen Lack of Transportation (Non-Medical):   Physical Activity:   . Days of Exercise per Week:   . Minutes of Exercise per Session:   Stress:   . Feeling of Stress :   Social Connections:   . Frequency of Communication with Friends and Family:   . Frequency of Social Gatherings with Friends and Family:   .  Attends Religious Services:   . Active Member of Clubs or Organizations:   . Attends Archivist Meetings:   Marland Kitchen Marital Status:      Family History: The patient's family history includes Heart attack in his brother; Heart disease in his father; Hypertension in his mother; Suicidality in his mother; Valvular heart disease in his father.  ROS:   Please see the history of present illness.     All other systems reviewed and are negative.  EKGs/Labs/Other Studies Reviewed:    The following studies were reviewed today:   EKG:  EKG is ordered today.  The ekg ordered today demonstrates sinus rhythm with sinus arrhythmia, PVCs, rate 86, nonspecific T wave flattening  TTE 07/09/19: 1. Left ventricular ejection fraction, by estimation, is <20%. The left  ventricle has severely decreased function. The left ventrical demonstrates  global hypokinesis. The left ventricular internal cavity size was mildly  dilated. Left ventricular  diastolic parameters are indeterminate.  2. Right ventricular systolic function is moderately reduced. The right  ventricular size is mildly enlarged. There is moderately elevated  pulmonary artery systolic pressure. The estimated right ventricular  systolic pressure is 28.3 mmHg.  3. Mild to moderate mitral valve regurgitation.  4. Tricuspid  valve regurgitation is severe.  5. The aortic valve is tricuspid. Mild aortic valve stenosis, mean  gradient only 6 mmHg but visually at least mild AS. No aortic  insufficiency.  6. Aortic dilatation noted. There is mild dilatation of the aortic root  measuring 41 mm.  7. Dilated IVC suggesting RA pressure 15 mmHg.  8. Rhythm difficult to discern, may be atrial fibrillation.   TTE 04/02/19:  1. Left ventricular ejection fraction, by visual estimation, is 40 to 45%. The left ventricle has moderately decreased function. Left ventricular septal wall thickness was normal. Mildly increased left ventricular posterior wall thickness. There is  borderline left ventricular hypertrophy.  2. Inferior and inferoseptal akinesis and thinning.  3. Elevated left atrial and left ventricular end-diastolic pressures.  4. Left ventricular diastolic parameters are consistent with Grade III diastolic dysfunction (restrictive).  5. Global right ventricle has mildly reduced systolic function.The right ventricular size is normal. No increase in right ventricular wall thickness.  6. Left atrial size was severely dilated.  7. Right atrial size was moderately dilated.  8. The mitral valve is abnormal. Mild mitral valve regurgitation.  9. The tricuspid valve is grossly normal. Tricuspid valve regurgitation moderate. 10. Aortic valve area, by VTI measures 1.60 cm. 11. Aortic valve mean gradient measures 8.0 mmHg. 12. Aortic valve peak gradient measures 13.1 mmHg. 13. The aortic valve is tricuspid. Aortic valve regurgitation is trivial. Mild aortic valve stenosis. 14. The pulmonic valve was grossly normal. Pulmonic valve regurgitation is mild. 15. Moderately elevated pulmonary artery systolic pressure. 16. The inferior vena cava is dilated in size with >50% respiratory variability, suggesting right atrial pressure of 8 mmHg. 17. The average left ventricular global longitudinal strain is -10.7 %. 18. A ICD is  visualized.  Recent Labs: 05/21/2019: NT-Pro BNP 6,191 07/06/2019: TSH 1.051 07/18/2019: B Natriuretic Peptide 2,554.9 07/19/2019: ALT 74 07/20/2019: Hemoglobin 12.5; Magnesium 2.0; Platelets 103 08/03/2019: BUN 57; Creatinine, Ser 2.11; Potassium 3.8; Sodium 140  Recent Lipid Panel    Component Value Date/Time   CHOL 144 03/26/2019 1425   TRIG 70 03/26/2019 1425   HDL 71 03/26/2019 1425   CHOLHDL 2.0 03/26/2019 1425   LDLCALC 59 03/26/2019 1425    Physical Exam:    VS:  BP 114/66   Pulse 86   Temp (!) 96.8 F (36 C)   Ht 5\' 8"  (1.727 m)   Wt 226 lb (102.5 kg)   SpO2 99%   BMI 34.36 kg/m     Wt Readings from Last 3 Encounters:  08/13/19 226 lb (102.5 kg)  08/03/19 216 lb 3.2 oz (98.1 kg)  07/20/19 201 lb 12.8 oz (91.5 kg)     GEN:  in no acute distress HEENT: Normal NECK:+ JVD LYMPHATICS: No lymphadenopathy CARDIAC: RRR, no murmurs, rubs, gallops RESPIRATORY:  Clear to auscultation without rales, wheezing or rhonchi  ABDOMEN: Soft, non-tender, non-distended MUSCULOSKELETAL:  2+ LE edema SKIN: Warm and dry NEUROLOGIC:  Alert and oriented x 3 PSYCHIATRIC:  Normal affect   ASSESSMENT:    1. Acute on chronic combined systolic (congestive) and diastolic (congestive) heart failure ([place])   2. Medication management   3. CKD (chronic kidney disease), stage IV ([place])   4. Edema, unspecified type   5. Shortness of breath    PLAN:     Acute on chronic systolic and diastolic heart failure: ischemic cardiomyopathy.  EF <20%.  On torsemide 20 mg BID.  Status post AICD.  He is not on any heart failure meds, as due to hypotension during recent admission, heart failure meds were discontinued and he was started on midodrine.  Appears volume overloaded on exam, with worsening edema and dyspnea. -Increase torsemide to 40 mg BID.  Discussed that may need inpatient admission for IV diuresis if not responding to p.o. diuretics -Check CMET, CBC, BNP.  Will need close monitoring of renal  function with diuresis given CKD stage IV  CAD: Status post CABG x1 in 1990.  Denies any chest pain  -Continue aspirin, beta-blocker -Currently on simvastatin 40 mg daily, LDL 59, at goal less than 70  Hypertension: Previously was on carvedilol, but given hypotension during recent admission was discontinued and started on midodrine.  Weaned midodrine at last clinic visit, currently on 10 mg BID  Atrial fibrillation: On Eliquis 2.5 mg twice daily.  CHADS-VASc 7 - Continue Eliquis 2.5 mg BID, which is an appropriate dose for him given age greater than 59 and creatinine greater than 1.5  Follow-up in 1 week   Medication Adjustments/Labs and Tests Ordered: Current medicines are reviewed at length with the patient today.  Concerns regarding medicines are outlined above.  Orders Placed This Encounter  Procedures  . CBC  . Brain natriuretic peptide  . Comprehensive metabolic panel  . Magnesium  . EKG 12-Lead   Meds ordered this encounter  Medications  . torsemide (DEMADEX) 20 MG tablet    Sig: Take 2 tablets (40 mg total) by mouth 2 (two) times daily.    Dispense:  120 tablet    Refill:  3    Patient Instructions  Medication Instructions:  INCREASE torsemide to 40 mg two times daily ----Call tomorrow to update [place] on swelling, shortness of breath, how you are feeling.    ----If symptoms worse, please go to ER or call 911  *If you need a refill on your cardiac medications before your next appointment, please call your pharmacy*   Lab Work: TODAY (CMET, CBC, Mag, BNP)  If you have labs (blood work) drawn today and your tests are completely normal, you will receive your results only by: Marland Kitchen MyChart Message (if you have MyChart) OR . A paper copy in the mail If you have any lab test that is abnormal or we need to change  your treatment, we will call you to review the results.   Testing/Procedures: NONE  Follow-Up: At [facility], you and your health needs are our priority.   As part of our continuing mission to provide you with exceptional heart care, we have created designated Provider Care Teams.  These Care Teams include your primary Cardiologist (physician) and Advanced Practice Providers (APPs -  Physician Assistants and Nurse Practitioners) who all work together to provide you with the care you need, when you need it.  We recommend signing up for the patient portal called "MyChart".  Sign up information is provided on this After Visit Summary.  MyChart is used to connect with patients for Virtual Visits (Telemedicine).  Patients are able to view lab/test results, encounter notes, upcoming appointments, etc.  Non-urgent messages can be sent to your provider as well.   To learn more about what you can do with MyChart, go to [email or web address].    Your next appointment:   Wednesday 3/24 at 11:40 AM with Dr. Gardiner Rhyme    Signed, Donato Heinz, MD  08/14/2019 12:15 AM    [place]

## 2019-08-14 ENCOUNTER — Telehealth: Payer: Self-pay | Admitting: Cardiology

## 2019-08-14 LAB — BRAIN NATRIURETIC PEPTIDE: BNP: 1224.9 pg/mL — ABNORMAL HIGH (ref 0.0–100.0)

## 2019-08-14 LAB — MAGNESIUM: Magnesium: 2.1 mg/dL (ref 1.6–2.3)

## 2019-08-14 LAB — CBC
Hematocrit: 35.5 % — ABNORMAL LOW (ref 37.5–51.0)
Hemoglobin: 11.5 g/dL — ABNORMAL LOW (ref 13.0–17.7)
MCH: 30.4 pg (ref 26.6–33.0)
MCHC: 32.4 g/dL (ref 31.5–35.7)
MCV: 94 fL (ref 79–97)
Platelets: 95 10*3/uL — CL (ref 150–450)
RBC: 3.78 x10E6/uL — ABNORMAL LOW (ref 4.14–5.80)
RDW: 16.2 % — ABNORMAL HIGH (ref 11.6–15.4)
WBC: 5.4 10*3/uL (ref 3.4–10.8)

## 2019-08-14 LAB — COMPREHENSIVE METABOLIC PANEL
ALT: 38 IU/L (ref 0–44)
AST: 40 IU/L (ref 0–40)
Albumin/Globulin Ratio: 1.6 (ref 1.2–2.2)
Albumin: 4.1 g/dL (ref 3.6–4.6)
Alkaline Phosphatase: 221 IU/L — ABNORMAL HIGH (ref 39–117)
BUN/Creatinine Ratio: 29 — ABNORMAL HIGH (ref 10–24)
BUN: 52 mg/dL — ABNORMAL HIGH (ref 8–27)
Bilirubin Total: 1.9 mg/dL — ABNORMAL HIGH (ref 0.0–1.2)
CO2: 22 mmol/L (ref 20–29)
Calcium: 9.3 mg/dL (ref 8.6–10.2)
Chloride: 103 mmol/L (ref 96–106)
Creatinine, Ser: 1.78 mg/dL — ABNORMAL HIGH (ref 0.76–1.27)
GFR calc Af Amer: 39 mL/min/{1.73_m2} — ABNORMAL LOW (ref >59)
GFR calc non Af Amer: 34 mL/min/{1.73_m2} — ABNORMAL LOW (ref >59)
Globulin, Total: 2.6 g/dL (ref 1.5–4.5)
Glucose: 116 mg/dL — ABNORMAL HIGH (ref 65–99)
Potassium: 3.8 mmol/L (ref 3.5–5.2)
Sodium: 143 mmol/L (ref 134–144)
Total Protein: 6.7 g/dL (ref 6.0–8.5)

## 2019-08-14 NOTE — Telephone Encounter (Signed)
Per pt feels somewhat better but swelling is still there and  the SOB has improved to .Will forward to Dr Gardiner Rhyme for review and recommendations pt also was asking about lab results ./cy

## 2019-08-14 NOTE — Telephone Encounter (Signed)
Patient called to give update as advised to do per Dr. Gardiner Rhyme.  He states he is feeling, however he hasn't lost much fluid.  He is also calling for his lab result.

## 2019-08-14 NOTE — Telephone Encounter (Signed)
Called patient, left detailed message (ok per DPR)-per Dr. Gardiner Rhyme no changes recommended based on lab work.   Continue current regimen and call on Friday to update on swelling/SOB.    Advised to call back with additional questions or concerns.

## 2019-08-16 ENCOUNTER — Emergency Department (HOSPITAL_COMMUNITY): Payer: Medicare Other

## 2019-08-16 ENCOUNTER — Encounter (HOSPITAL_COMMUNITY): Payer: Self-pay

## 2019-08-16 ENCOUNTER — Inpatient Hospital Stay (HOSPITAL_COMMUNITY)
Admission: EM | Admit: 2019-08-16 | Discharge: 2019-09-11 | DRG: 291 | Disposition: A | Discharge status: Home Health | Payer: Medicare Other | Attending: Cardiovascular Disease | Admitting: Cardiovascular Disease

## 2019-08-16 ENCOUNTER — Other Ambulatory Visit: Payer: Self-pay

## 2019-08-16 DIAGNOSIS — D696 Thrombocytopenia, unspecified: Secondary | ICD-10-CM

## 2019-08-16 DIAGNOSIS — I878 Other specified disorders of veins: Secondary | ICD-10-CM | POA: Diagnosis present

## 2019-08-16 DIAGNOSIS — D631 Anemia in chronic kidney disease: Secondary | ICD-10-CM | POA: Diagnosis not present

## 2019-08-16 DIAGNOSIS — Z20822 Contact with and (suspected) exposure to covid-19: Secondary | ICD-10-CM | POA: Diagnosis present

## 2019-08-16 DIAGNOSIS — M1A9XX0 Chronic gout, unspecified, without tophus (tophi): Secondary | ICD-10-CM | POA: Diagnosis present

## 2019-08-16 DIAGNOSIS — I5020 Unspecified systolic (congestive) heart failure: Secondary | ICD-10-CM

## 2019-08-16 DIAGNOSIS — N2581 Secondary hyperparathyroidism of renal origin: Secondary | ICD-10-CM | POA: Diagnosis not present

## 2019-08-16 DIAGNOSIS — Z66 Do not resuscitate: Secondary | ICD-10-CM | POA: Diagnosis present

## 2019-08-16 DIAGNOSIS — Z992 Dependence on renal dialysis: Secondary | ICD-10-CM

## 2019-08-16 DIAGNOSIS — I480 Paroxysmal atrial fibrillation: Secondary | ICD-10-CM | POA: Diagnosis not present

## 2019-08-16 DIAGNOSIS — N189 Chronic kidney disease, unspecified: Secondary | ICD-10-CM

## 2019-08-16 DIAGNOSIS — I255 Ischemic cardiomyopathy: Secondary | ICD-10-CM | POA: Diagnosis present

## 2019-08-16 DIAGNOSIS — I5082 Biventricular heart failure: Secondary | ICD-10-CM | POA: Diagnosis present

## 2019-08-16 DIAGNOSIS — E782 Mixed hyperlipidemia: Secondary | ICD-10-CM | POA: Diagnosis present

## 2019-08-16 DIAGNOSIS — I340 Nonrheumatic mitral (valve) insufficiency: Secondary | ICD-10-CM | POA: Diagnosis not present

## 2019-08-16 DIAGNOSIS — I472 Ventricular tachycardia: Secondary | ICD-10-CM | POA: Diagnosis not present

## 2019-08-16 DIAGNOSIS — R0601 Orthopnea: Secondary | ICD-10-CM | POA: Diagnosis not present

## 2019-08-16 DIAGNOSIS — I361 Nonrheumatic tricuspid (valve) insufficiency: Secondary | ICD-10-CM | POA: Diagnosis not present

## 2019-08-16 DIAGNOSIS — Z452 Encounter for adjustment and management of vascular access device: Secondary | ICD-10-CM

## 2019-08-16 DIAGNOSIS — I953 Hypotension of hemodialysis: Secondary | ICD-10-CM | POA: Diagnosis not present

## 2019-08-16 DIAGNOSIS — N179 Acute kidney failure, unspecified: Secondary | ICD-10-CM | POA: Diagnosis not present

## 2019-08-16 DIAGNOSIS — I25810 Atherosclerosis of coronary artery bypass graft(s) without angina pectoris: Secondary | ICD-10-CM | POA: Diagnosis not present

## 2019-08-16 DIAGNOSIS — E669 Obesity, unspecified: Secondary | ICD-10-CM | POA: Diagnosis present

## 2019-08-16 DIAGNOSIS — I77810 Thoracic aortic ectasia: Secondary | ICD-10-CM | POA: Diagnosis present

## 2019-08-16 DIAGNOSIS — R0600 Dyspnea, unspecified: Secondary | ICD-10-CM | POA: Diagnosis not present

## 2019-08-16 DIAGNOSIS — Z7289 Other problems related to lifestyle: Secondary | ICD-10-CM

## 2019-08-16 DIAGNOSIS — I132 Hypertensive heart and chronic kidney disease with heart failure and with stage 5 chronic kidney disease, or end stage renal disease: Secondary | ICD-10-CM | POA: Diagnosis not present

## 2019-08-16 DIAGNOSIS — Z8673 Personal history of transient ischemic attack (TIA), and cerebral infarction without residual deficits: Secondary | ICD-10-CM

## 2019-08-16 DIAGNOSIS — D693 Immune thrombocytopenic purpura: Secondary | ICD-10-CM | POA: Diagnosis not present

## 2019-08-16 DIAGNOSIS — Z9049 Acquired absence of other specified parts of digestive tract: Secondary | ICD-10-CM

## 2019-08-16 DIAGNOSIS — I5021 Acute systolic (congestive) heart failure: Secondary | ICD-10-CM

## 2019-08-16 DIAGNOSIS — Z79899 Other long term (current) drug therapy: Secondary | ICD-10-CM | POA: Diagnosis not present

## 2019-08-16 DIAGNOSIS — Z7901 Long term (current) use of anticoagulants: Secondary | ICD-10-CM | POA: Diagnosis not present

## 2019-08-16 DIAGNOSIS — I083 Combined rheumatic disorders of mitral, aortic and tricuspid valves: Secondary | ICD-10-CM | POA: Diagnosis present

## 2019-08-16 DIAGNOSIS — I2510 Atherosclerotic heart disease of native coronary artery without angina pectoris: Secondary | ICD-10-CM | POA: Diagnosis not present

## 2019-08-16 DIAGNOSIS — Z95810 Presence of automatic (implantable) cardiac defibrillator: Secondary | ICD-10-CM

## 2019-08-16 DIAGNOSIS — I2721 Secondary pulmonary arterial hypertension: Secondary | ICD-10-CM | POA: Diagnosis present

## 2019-08-16 DIAGNOSIS — R609 Edema, unspecified: Secondary | ICD-10-CM | POA: Diagnosis not present

## 2019-08-16 DIAGNOSIS — I739 Peripheral vascular disease, unspecified: Secondary | ICD-10-CM | POA: Diagnosis present

## 2019-08-16 DIAGNOSIS — K7460 Unspecified cirrhosis of liver: Secondary | ICD-10-CM | POA: Diagnosis not present

## 2019-08-16 DIAGNOSIS — Z515 Encounter for palliative care: Secondary | ICD-10-CM | POA: Diagnosis not present

## 2019-08-16 DIAGNOSIS — Z951 Presence of aortocoronary bypass graft: Secondary | ICD-10-CM

## 2019-08-16 DIAGNOSIS — Z87891 Personal history of nicotine dependence: Secondary | ICD-10-CM

## 2019-08-16 DIAGNOSIS — Z6836 Body mass index (BMI) 36.0-36.9, adult: Secondary | ICD-10-CM

## 2019-08-16 DIAGNOSIS — I509 Heart failure, unspecified: Secondary | ICD-10-CM | POA: Diagnosis not present

## 2019-08-16 DIAGNOSIS — Z8249 Family history of ischemic heart disease and other diseases of the circulatory system: Secondary | ICD-10-CM

## 2019-08-16 DIAGNOSIS — I5084 End stage heart failure: Secondary | ICD-10-CM | POA: Diagnosis present

## 2019-08-16 DIAGNOSIS — N400 Enlarged prostate without lower urinary tract symptoms: Secondary | ICD-10-CM | POA: Diagnosis present

## 2019-08-16 DIAGNOSIS — E559 Vitamin D deficiency, unspecified: Secondary | ICD-10-CM | POA: Diagnosis present

## 2019-08-16 DIAGNOSIS — R34 Anuria and oliguria: Secondary | ICD-10-CM | POA: Diagnosis not present

## 2019-08-16 DIAGNOSIS — N170 Acute kidney failure with tubular necrosis: Secondary | ICD-10-CM | POA: Diagnosis not present

## 2019-08-16 DIAGNOSIS — G2581 Restless legs syndrome: Secondary | ICD-10-CM | POA: Diagnosis present

## 2019-08-16 DIAGNOSIS — I4891 Unspecified atrial fibrillation: Secondary | ICD-10-CM | POA: Diagnosis not present

## 2019-08-16 DIAGNOSIS — N186 End stage renal disease: Secondary | ICD-10-CM | POA: Diagnosis not present

## 2019-08-16 DIAGNOSIS — I5023 Acute on chronic systolic (congestive) heart failure: Secondary | ICD-10-CM | POA: Diagnosis present

## 2019-08-16 DIAGNOSIS — I959 Hypotension, unspecified: Secondary | ICD-10-CM | POA: Diagnosis not present

## 2019-08-16 DIAGNOSIS — Z7189 Other specified counseling: Secondary | ICD-10-CM | POA: Diagnosis not present

## 2019-08-16 DIAGNOSIS — N19 Unspecified kidney failure: Secondary | ICD-10-CM

## 2019-08-16 DIAGNOSIS — R0602 Shortness of breath: Secondary | ICD-10-CM | POA: Diagnosis not present

## 2019-08-16 DIAGNOSIS — D509 Iron deficiency anemia, unspecified: Secondary | ICD-10-CM | POA: Diagnosis not present

## 2019-08-16 DIAGNOSIS — I5043 Acute on chronic combined systolic (congestive) and diastolic (congestive) heart failure: Secondary | ICD-10-CM | POA: Diagnosis not present

## 2019-08-16 DIAGNOSIS — N184 Chronic kidney disease, stage 4 (severe): Secondary | ICD-10-CM | POA: Diagnosis not present

## 2019-08-16 DIAGNOSIS — N1832 Chronic kidney disease, stage 3b: Secondary | ICD-10-CM | POA: Diagnosis not present

## 2019-08-16 LAB — HEPATIC FUNCTION PANEL
ALT: 37 U/L (ref 0–44)
AST: 40 U/L (ref 15–41)
Albumin: 3.7 g/dL (ref 3.5–5.0)
Alkaline Phosphatase: 188 U/L — ABNORMAL HIGH (ref 38–126)
Bilirubin, Direct: 1 mg/dL — ABNORMAL HIGH (ref 0.0–0.2)
Indirect Bilirubin: 1.2 mg/dL — ABNORMAL HIGH (ref 0.3–0.9)
Total Bilirubin: 2.2 mg/dL — ABNORMAL HIGH (ref 0.3–1.2)
Total Protein: 6.9 g/dL (ref 6.5–8.1)

## 2019-08-16 LAB — BASIC METABOLIC PANEL
Anion gap: 15 (ref 5–15)
BUN: 60 mg/dL — ABNORMAL HIGH (ref 8–23)
CO2: 24 mmol/L (ref 22–32)
Calcium: 9.1 mg/dL (ref 8.9–10.3)
Chloride: 102 mmol/L (ref 98–111)
Creatinine, Ser: 2.08 mg/dL — ABNORMAL HIGH (ref 0.61–1.24)
GFR calc Af Amer: 32 mL/min — ABNORMAL LOW (ref >60)
GFR calc non Af Amer: 28 mL/min — ABNORMAL LOW (ref >60)
Glucose, Bld: 101 mg/dL — ABNORMAL HIGH (ref 70–99)
Potassium: 3.8 mmol/L (ref 3.5–5.1)
Sodium: 141 mmol/L (ref 135–145)

## 2019-08-16 LAB — CBC
HCT: 35.8 % — ABNORMAL LOW (ref 39.0–52.0)
Hemoglobin: 11.2 g/dL — ABNORMAL LOW (ref 13.0–17.0)
MCH: 30.8 pg (ref 26.0–34.0)
MCHC: 31.3 g/dL (ref 30.0–36.0)
MCV: 98.4 fL (ref 80.0–100.0)
Platelets: 90 10*3/uL — ABNORMAL LOW (ref 150–400)
RBC: 3.64 MIL/uL — ABNORMAL LOW (ref 4.22–5.81)
RDW: 17.9 % — ABNORMAL HIGH (ref 11.5–15.5)
WBC: 5.6 10*3/uL (ref 4.0–10.5)
nRBC: 0 % (ref 0.0–0.2)

## 2019-08-16 LAB — TROPONIN I (HIGH SENSITIVITY)
Troponin I (High Sensitivity): <2 ng/L (ref <18)
Troponin I (High Sensitivity): 9 ng/L (ref <18)

## 2019-08-16 LAB — BRAIN NATRIURETIC PEPTIDE: B Natriuretic Peptide: 1081.1 pg/mL — ABNORMAL HIGH (ref 0.0–100.0)

## 2019-08-16 MED ORDER — SODIUM CHLORIDE 0.9% FLUSH
3.0000 mL | Freq: Once | INTRAVENOUS | Status: AC
  Administered 2019-08-16: 3 mL via INTRAVENOUS

## 2019-08-16 MED ORDER — ALLOPURINOL 100 MG PO TABS
50.0000 mg | ORAL_TABLET | ORAL | Status: DC
  Administered 2019-08-17 – 2019-09-08 (×12): 50 mg via ORAL
  Filled 2019-08-16 (×18): qty 1
  Filled 2019-08-16: qty 0.5

## 2019-08-16 MED ORDER — SODIUM CHLORIDE 0.9 % IV SOLN
250.0000 mL | INTRAVENOUS | Status: DC | PRN
  Administered 2019-08-19: 250 mL via INTRAVENOUS

## 2019-08-16 MED ORDER — FOLIC ACID-VIT B6-VIT B12 2.5-25-1 MG PO TABS
1.0000 | ORAL_TABLET | Freq: Every day | ORAL | Status: DC
  Filled 2019-08-16 (×4): qty 1

## 2019-08-16 MED ORDER — FUROSEMIDE 10 MG/ML IJ SOLN: 60.0000 mg | Freq: Once | INTRAMUSCULAR | Status: DC

## 2019-08-16 MED ORDER — TAMSULOSIN HCL 0.4 MG PO CAPS
0.4000 mg | ORAL_CAPSULE | Freq: Every day | ORAL | Status: DC
  Administered 2019-08-17 – 2019-09-11 (×26): 0.4 mg via ORAL
  Filled 2019-08-16 (×26): qty 1

## 2019-08-16 MED ORDER — ACETAMINOPHEN 325 MG PO TABS
650.0000 mg | ORAL_TABLET | ORAL | Status: DC | PRN
  Administered 2019-08-21 – 2019-09-11 (×23): 650 mg via ORAL
  Filled 2019-08-16 (×25): qty 2

## 2019-08-16 MED ORDER — SODIUM CHLORIDE 0.9% FLUSH: 3.0000 mL | INTRAVENOUS | Status: DC | PRN

## 2019-08-16 MED ORDER — PRAMIPEXOLE DIHYDROCHLORIDE 0.25 MG PO TABS
0.2500 mg | ORAL_TABLET | Freq: Two times a day (BID) | ORAL | Status: DC
  Administered 2019-08-17 – 2019-09-11 (×52): 0.25 mg via ORAL
  Filled 2019-08-16 (×55): qty 1

## 2019-08-16 MED ORDER — SODIUM CHLORIDE 0.9% FLUSH
3.0000 mL | Freq: Two times a day (BID) | INTRAVENOUS | Status: DC
  Administered 2019-08-17 – 2019-09-09 (×31): 3 mL via INTRAVENOUS

## 2019-08-16 MED ORDER — ROSUVASTATIN CALCIUM 5 MG PO TABS
10.0000 mg | ORAL_TABLET | Freq: Every day | ORAL | Status: DC
  Administered 2019-08-17 – 2019-09-10 (×26): 10 mg via ORAL
  Filled 2019-08-16 (×26): qty 2

## 2019-08-16 MED ORDER — APIXABAN 2.5 MG PO TABS
2.5000 mg | ORAL_TABLET | Freq: Two times a day (BID) | ORAL | Status: DC
  Administered 2019-08-17 – 2019-08-21 (×10): 2.5 mg via ORAL
  Filled 2019-08-16 (×11): qty 1

## 2019-08-16 MED ORDER — FUROSEMIDE 10 MG/ML IJ SOLN
80.0000 mg | Freq: Once | INTRAMUSCULAR | Status: AC
  Administered 2019-08-16: 80 mg via INTRAVENOUS
  Filled 2019-08-16: qty 8

## 2019-08-16 MED ORDER — MIDODRINE HCL 5 MG PO TABS
10.0000 mg | ORAL_TABLET | Freq: Two times a day (BID) | ORAL | Status: DC
  Administered 2019-08-17 – 2019-08-21 (×10): 10 mg via ORAL
  Filled 2019-08-16 (×11): qty 2

## 2019-08-16 NOTE — ED Provider Notes (Signed)
[facility] EMERGENCY DEPARTMENT Provider Note   CSN: [id number] Arrival date & time: 08/16/19  2992     History Chief Complaint  Patient presents with  . Leg Swelling    Ricardo Payne is a 86 y.o. male.  Patient is an 86 year old male with a history of paroxysmal atrial fibrillation, pulmonary artery hypertension, CHF with most recent echo in February with an EF of less than 20%, CKD, CAD who presents today with worsening swelling.  Patient states for weeks now he has had swelling in his lower extremity and has had several hospitalizations in the past and was taking torsemide 20 mg daily.  Last week because of ongoing swelling and weight gain his torsemide was increased to 40 mg daily.  He saw Dr. Nechama Guard his cardiologist approximately 4 days ago and they increased his torsemide to 40 mg twice daily.  He initially had a 5 pound weight loss within a day but then in the last 24 hours he has gained 6 pounds back and had worsening shortness of breath.  Now he can only go 10 to 15 feet before he is winded and has to sit down.  He denies any shortness of breath or chest pain at rest.  He also has significant swelling in bilateral lower extremities, lower abdomen and testicles.  He denies any cough, shortness of breath, fever, chest pain, abdominal pain, nausea or vomiting.  He has not missed any doses of diuretics and is following a low-salt diet.  The history is provided by the patient.       Past Medical History:  Diagnosis Date  . Acquired thrombophilia ([place])   . Age-related cataract of both eyes   . ASHD (arteriosclerotic heart disease)   . Benign prostatic hyperplasia   . CAD (coronary artery disease)    a. s/p CABG x1 in 1990  . Cerebellar stroke ([place]) 07/02/2016  . Chronic combined systolic and diastolic CHF (congestive heart failure) ([place])   . Chronic gout without tophus   . CKD (chronic kidney disease), stage IV ([place])   . Essential hypertension   . ICD  (implantable cardioverter-defibrillator) in place   . Ischemic cardiomyopathy   . Mixed hyperlipidemia   . PAF (paroxysmal atrial fibrillation) ([place])   . Restless legs   . Secondary pulmonary arterial hypertension ([place])   . Thrombocytopenia ([place])   . Vitamin D deficiency     Patient Active Problem List   Diagnosis Date Noted  . Heart failure with reduced ejection fraction ([place]) 07/19/2019  . Congestive heart failure ([place]) 07/18/2019  . Palliative care by specialist   . Goals of care, counseling/discussion   . Cirrhosis ([place])   . CKD (chronic kidney disease) 06/25/2019  . Head trauma 06/25/2019  . Blood thinned due to long-term anticoagulant use 06/25/2019  . Anemia 06/25/2019  . Acute kidney injury superimposed on chronic kidney disease ([place]) 05/23/2019  . Chronic combined systolic and diastolic CHF (congestive heart failure) ([place]) 05/23/2019  . Esophageal hypertension 05/23/2019  . Thrombocytopenia ([place]) 05/23/2019  . Acute on chronic HFrEF (heart failure with reduced ejection fraction) ([place]) 05/22/2019  . ICD (implantable cardioverter-defibrillator) in place 04/18/2019    Past Surgical History:  Procedure Laterality Date  . CHOLECYSTECTOMY    . CORONARY ARTERY BYPASS GRAFT    . PACEMAKER IMPLANT  10/2018       Family History  Problem Relation Age of Onset  . Hypertension Mother   . Suicidality Mother   . Valvular heart  disease Father   . Heart disease Father   . Heart attack Brother     Social History   Tobacco Use  . Smoking status: Former Smoker    Quit date: 1988    Years since quitting: 33.2  . Smokeless tobacco: Never Used  Substance Use Topics  . Alcohol use: Yes    Comment: rare  . Drug use: Never    Home Medications Prior to Admission medications   Medication Sig Start Date End Date Taking? Authorizing Provider  allopurinol (ZYLOPRIM) 100 MG tablet Take 0.5 tablets (50 mg total) by mouth every other day. 07/12/19   [name], MD    apixaban (ELIQUIS) 2.5 MG TABS tablet Take 2.5 mg by mouth 2 (two) times daily.    [provider]  ergocalciferol (VITAMIN D2) 1.25 MG (50000 UT) capsule Take 50,000 Units by mouth once a week. Sunday    [provider]  Folic Acid-Vit Z0-SPQ Z30 (FOLBEE) 2.5-25-1 MG TABS tablet Take 1 tablet by mouth daily.    [provider]  midodrine (PROAMATINE) 10 MG tablet Take 1 tablet (10 mg total) by mouth 2 (two) times daily. 08/03/19   Donato Heinz, MD  pramipexole (MIRAPEX) 0.25 MG tablet Take 0.25 mg by mouth 2 (two) times daily.    [provider]  rosuvastatin (CRESTOR) 10 MG tablet Take 1 tablet (10 mg total) by mouth daily. 08/04/19 07/29/20  Richardson Dopp T, PA-C  tamsulosin (FLOMAX) 0.4 MG CAPS capsule Take 1 capsule (0.4 mg total) by mouth daily. 07/21/19   [name], MD  torsemide (DEMADEX) 20 MG tablet Take 2 tablets (40 mg total) by mouth 2 (two) times daily. 08/13/19   Donato Heinz, MD    Allergies    Patient has no known allergies.  Review of Systems   Review of Systems  All other systems reviewed and are negative.   Physical Exam Updated Vital Signs BP 125/78 (BP Location: Right Arm)   Pulse 99   Temp 98 F (36.7 C) (Oral)   Resp (!) 22   Ht 5\' 6"  (1.676 m)   Wt 100.2 kg   SpO2 100%   BMI 35.67 kg/m   Physical Exam Vitals and nursing note reviewed.  Constitutional:      General: He is not in acute distress.    Appearance: Normal appearance. He is well-developed.  HENT:     Head: Normocephalic and atraumatic.  Eyes:     Conjunctiva/sclera: Conjunctivae normal.     Pupils: Pupils are equal, round, and reactive to light.  Cardiovascular:     Rate and Rhythm: Normal rate. Rhythm irregularly irregular.     Heart sounds: No murmur.  Pulmonary:     Effort: Pulmonary effort is normal. Tachypnea present. No respiratory distress.     Breath sounds: Examination of the right-lower field reveals rales. Examination  of the left-lower field reveals rales. Rhonchi and rales present. No wheezing.  Abdominal:     General: There is no distension.     Palpations: Abdomen is soft.     Tenderness: There is no abdominal tenderness. There is no guarding or rebound.  Musculoskeletal:        General: No tenderness. Normal range of motion.     Cervical back: Normal range of motion and neck supple.     Right lower leg: Edema present.     Left lower leg: Edema present.     Comments: Tense pitting edema in bilateral lower extremities all  the way up to the thighs.  Scrotal edema and mild edema in the distal abdomen  Skin:    General: Skin is warm and dry.     Findings: No erythema or rash.  Neurological:     Mental Status: He is alert and oriented to person, place, and time.  Psychiatric:        Mood and Affect: Mood normal.        Behavior: Behavior normal.        Thought Content: Thought content normal.     ED Results / Procedures / Treatments   Labs (all labs ordered are listed, but only abnormal results are displayed) Labs Reviewed  BASIC METABOLIC PANEL - Abnormal; Notable for the following components:      Result Value   Glucose, Bld 101 (*)    BUN 60 (*)    Creatinine, Ser 2.08 (*)    GFR calc non Af Amer 28 (*)    GFR calc Af Amer 32 (*)    All other components within normal limits  CBC - Abnormal; Notable for the following components:   RBC 3.64 (*)    Hemoglobin 11.2 (*)    HCT 35.8 (*)    RDW 17.9 (*)    Platelets 90 (*)    All other components within normal limits  SARS CORONAVIRUS 2 (TAT 6-24 HRS)  BRAIN NATRIURETIC PEPTIDE  TROPONIN I (HIGH SENSITIVITY)  TROPONIN I (HIGH SENSITIVITY)    EKG EKG Interpretation  Date/Time:  Thursday August 16 2019 16:48:59 EDT Ventricular Rate:  88 PR Interval:  188 QRS Duration: 134 QT Interval:  424 QTC Calculation: 513 R Axis:   -15 Text Interpretation: Normal sinus rhythm Non-specific intra-ventricular conduction block Minimal voltage  criteria for LVH, may be normal variant ( Cornell product ) Abnormal QRS-T angle, consider primary T wave abnormality ST depression in Lateral leads RESOLVED SINCE PREVIOUS Confirmed by Blanchie Dessert [id number]) on 08/16/2019 9:28:26 PM   Radiology DG Chest 2 View  Result Date: 08/16/2019 CLINICAL DATA:  Dyspnea. Fluid retention. Congestive heart failure with bilateral lower extremity and scrotal edema. EXAM: CHEST - 2 VIEW COMPARISON:  July 18, 2019 FINDINGS: Stable mild cardiomegaly with central vascular congestion and mild thickening of peripheral interlobular septa. No obvious pleural effusion. Redemonstration of mediastinal and upper abdominal surgical clips, intact median sternotomy wires, and single left ventricular lead cardiac defibrillator, similarly positioned with left-sided battery pack. Aortic knob calcified atherosclerosis. Streaky consolidation in the bilateral infrahilar regions, likely atelectatic. No pneumonia or pneumothorax. Skeletal degenerative changes and bony demineralization. IMPRESSION: Stable mild cardiomegaly with mild pulmonary edema and chronic thoracic postoperative changes. No pneumonia or obvious pleural effusion. Aortic calcified atherosclerosis. Electronically Signed   By: Revonda Humphrey   On: 08/16/2019 18:00    Procedures Procedures (including critical care time)  Medications Ordered in ED Medications  sodium chloride flush (NS) 0.9 % injection 3 mL (has no administration in time range)  furosemide (LASIX) injection 80 mg (has no administration in time range)    ED Course  I have reviewed the triage vital signs and the nursing notes.  Pertinent labs & imaging results that were available during my care of the patient were reviewed by me and considered in my medical decision making (see chart for details).    MDM Rules/Calculators/A&P                       Elderly male with multiple medical problems including cardiomyopathy with  an EF of less than 20%  presenting today with failed outpatient treatment for diuresis.  He has had ongoing weight gain worsening swelling in his lower extremities up to his scrotum as well as dyspnea on exertion.  Patient has had a 6 pound weight gain in the last day and a half despite quadrupling his diuretics.  Patient has been followed by cardiology and recommended he come here for admission and IV diuresis.  Patient's renal function today is at its baseline he denies any infectious symptoms and he is having no chest pain.  EKG showing atrial fibrillation but rate controlled and improvement in ST segment depressions.  Troponin is 9, BNP is pending.  CBC without acute findings and chest x-ray with mild pulmonary edema.  Patient is not requiring oxygen at this time.  Spoke with cardiology who will admit the patient.  Patient was given 80 mg of IV Lasix for diuresis.  CRITICAL CARE Performed by: Czar Ysaguirre Total critical care time: 30 minutes Critical care time was exclusive of separately billable procedures and treating other patients. Critical care was necessary to treat or prevent imminent or life-threatening deterioration. Critical care was time spent personally by me on the following activities: development of treatment plan with patient and/or surrogate as well as nursing, discussions with consultants, evaluation of patient's response to treatment, examination of patient, obtaining history from patient or surrogate, ordering and performing treatments and interventions, ordering and review of laboratory studies, ordering and review of radiographic studies, pulse oximetry and re-evaluation of patient's condition.  Final Clinical Impression(s) / ED Diagnoses Final diagnoses:  Acute on chronic congestive heart failure, unspecified heart failure type [facility])    Rx / DC Orders ED Discharge Orders    None       Blanchie Dessert, MD 08/16/19 2200

## 2019-08-16 NOTE — Telephone Encounter (Addendum)
Weight down 5 pounds on Tuesday up 6 pounds today Patient is having increase swelling SOB with 10-15 steps, ok sitting Confirmed taking Torsemide 2 tablets twice a day  Will forward to Dr Debara Pickett DOD, Dr Gardiner Rhyme out of the office

## 2019-08-16 NOTE — Telephone Encounter (Signed)
Follow up  Pt's returning call to follow up Dr. Gardiner Rhyme recommendations for his swelling.  Please call

## 2019-08-16 NOTE — Telephone Encounter (Signed)
Spoke with patient and he is feeling worse than at visit Advised to go to [facility] ED, verbalized understanding Tried to call ED to let them know he was coming, no Trish notified

## 2019-08-16 NOTE — Telephone Encounter (Signed)
If worsening symptoms, I think he is is going to need IV diuresis.  Would recommend going to ED

## 2019-08-16 NOTE — ED Triage Notes (Signed)
Pt arrives to ED from home w/ c/o fluid retention. Pt has hx of CHF and notes BLE and scrotum edema. Pt denies chest pain, but endorses SOB w/ exertion.

## 2019-08-16 NOTE — H&P (Addendum)
Cardiology Admission History and Physical:   Patient ID: Ricardo Payne MRN: [id number]; DOB: 1931/03/07   Admission date: 08/16/2019  Primary Care Provider: [name], FNP Primary Cardiologist: Donato Heinz, MD  Primary Electrophysiologist:  Cristopher Peru, MD   Chief Complaint:  LE swelling, SOB  Patient Profile:   Ricardo Payne is a 86 y.o. male with NYHA III, ACC C ICM (LVEF 20%, ICD in situ, 3 HF hospitalization this year), CAD s/p CABG, CKD III (BL Cr ~ 2), pAF on dose reduced apixiaban who presents with worsening LE edema and SOB despite increasing oral diuretic doses.   History of Present Illness:   Ricardo Payne has had two recent admission for ADHF and volume overload (06/2019, 07/2019). During his most recent admission, TTE notable for LVEF 20% (from 40-45% in 04/2019). Due to hypotension during admission midodrine was started for hemodynamic support. He was discharged on 20mg  torsemide daily but no other GDMT due to hypotension and CKD. He was recently seen by Dr. Gardiner Rhyme and complained of a 10 lb weight gain despite an increase in torsemide to 20mg  BID. He was instructed to further increase his dose to 40 BID, which he did, but did not note a significant improvement in the swelling. In particular, he notes LE and abdominal swelling as well as significant scrotal edema. He is more SOB and orthopnea is worsening. He relayed this to Dr. Gardiner Rhyme who encouraged him to come to the [facility] for IV diuresis.   Upon arrival, BP 131/67, RR 16, SpO2 98% on RA. Labs with Cr at relative baseline (2.1) though BUN elevated at 60. CBC with stable thrombocytopenia (90). CXR with mild pulmonary edema. Cardiology consulted for admission given acute decompensated systolic HF with volume overload.    Past Medical History:  Diagnosis Date  . Acquired thrombophilia ([place])   . Age-related cataract of both eyes   . ASHD (arteriosclerotic heart disease)   . Benign prostatic  hyperplasia   . CAD (coronary artery disease)    a. s/p CABG x1 in 1990  . Cerebellar stroke ([employer]) 07/02/2016  . Chronic combined systolic and diastolic CHF (congestive heart failure) ([place])   . Chronic gout without tophus   . CKD (chronic kidney disease), stage IV ([place])   . Essential hypertension   . ICD (implantable cardioverter-defibrillator) in place   . Ischemic cardiomyopathy   . Mixed hyperlipidemia   . PAF (paroxysmal atrial fibrillation) ([place])   . Restless legs   . Secondary pulmonary arterial hypertension ([place])   . Thrombocytopenia ([place])   . Vitamin D deficiency     Past Surgical History:  Procedure Laterality Date  . CHOLECYSTECTOMY    . CORONARY ARTERY BYPASS GRAFT    . PACEMAKER IMPLANT  10/2018     Medications Prior to Admission: Prior to Admission medications   Medication Sig Start Date End Date Taking? Authorizing Provider  allopurinol (ZYLOPRIM) 100 MG tablet Take 0.5 tablets (50 mg total) by mouth every other day. 07/12/19   [name], MD  apixaban (ELIQUIS) 2.5 MG TABS tablet Take 2.5 mg by mouth 2 (two) times daily.    [provider]  ergocalciferol (VITAMIN D2) 1.25 MG (50000 UT) capsule Take 50,000 Units by mouth once a week. Sunday    [provider]  Folic Acid-Vit S4-HQP R91 (FOLBEE) 2.5-25-1 MG TABS tablet Take 1 tablet by mouth daily.    [provider]  midodrine (PROAMATINE) 10 MG tablet Take 1 tablet (10 mg total) by  mouth 2 (two) times daily. 08/03/19   Donato Heinz, MD  pramipexole (MIRAPEX) 0.25 MG tablet Take 0.25 mg by mouth 2 (two) times daily.    [provider]  rosuvastatin (CRESTOR) 10 MG tablet Take 1 tablet (10 mg total) by mouth daily. 08/04/19 07/29/20  Richardson Dopp T, PA-C  tamsulosin (FLOMAX) 0.4 MG CAPS capsule Take 1 capsule (0.4 mg total) by mouth daily. 07/21/19   [name], MD  torsemide (DEMADEX) 20 MG tablet Take 2 tablets (40 mg total) by mouth 2 (two) times daily. 08/13/19    Donato Heinz, MD     Allergies:   No Known Allergies  Social History:   Social History   Socioeconomic History  . Marital status: Widowed    Spouse name: Not on file  . Number of children: 2  . Years of education: Not on file  . Highest education level: Not on file  Occupational History  . Not on file  Tobacco Use  . Smoking status: Former Smoker    Quit date: 1988    Years since quitting: 33.2  . Smokeless tobacco: Never Used  Substance and Sexual Activity  . Alcohol use: Yes    Comment: rare  . Drug use: Never  . Sexual activity: Not on file  Other Topics Concern  . Not on file  Social History Narrative  . Not on file   Social Determinants of Health   Financial Resource Strain:   . Difficulty of Paying Living Expenses:   Food Insecurity:   . Worried About Charity fundraiser in the Last Year:   . Arboriculturist in the Last Year:   Transportation Needs:   . Film/video editor (Medical):   Marland Kitchen Lack of Transportation (Non-Medical):   Physical Activity:   . Days of Exercise per Week:   . Minutes of Exercise per Session:   Stress:   . Feeling of Stress :   Social Connections:   . Frequency of Communication with Friends and Family:   . Frequency of Social Gatherings with Friends and Family:   . Attends Religious Services:   . Active Member of Clubs or Organizations:   . Attends Archivist Meetings:   Marland Kitchen Marital Status:   Intimate Partner Violence:   . Fear of Current or Ex-Partner:   . Emotionally Abused:   Marland Kitchen Physically Abused:   . Sexually Abused:     Family History:   The patient's family history includes Heart attack in his brother; Heart disease in his father; Hypertension in his mother; Suicidality in his mother; Valvular heart disease in his father.    ROS:  Please see the history of present illness.  All other ROS reviewed and negative.     Physical Exam/Data:   Vitals:   08/16/19 1636 08/16/19 1915 08/16/19 2121  BP:  131/67 135/73 125/78  Pulse: 86 89 99  Resp: 16 18 (!) 22  Temp: 97.8 F (36.6 C)  98 F (36.7 C)  TempSrc: Oral  Oral  SpO2: 98% 99% 100%  Weight: 100.2 kg    Height: 5\' 6"  (1.676 m)     No intake or output data in the 24 hours ending 08/16/19 2237 Last 3 Weights 08/16/2019 08/13/2019 08/03/2019  Weight (lbs) 221 lb 226 lb 216 lb 3.2 oz  Weight (kg) 100.245 kg 102.513 kg 98.068 kg     Body mass index is 35.67 kg/m.  General:  Well nourished, well developed, in no  acute distress. HEENT: normal Neck: no JVD to angle of mandible at 45 degrees.  Cardiac:  normal S1, S2; RRR; no murmur, Lungs:  clear to auscultation bilaterally, +mild bibasilar rales.   Abd: soft, mildly distended.  Ext: 3+ pitting edema to the upper thigh.  Musculoskeletal:  No deformities, BUE and BLE strength normal and equal Skin: warm and dry  Neuro:  CNs 2-12 intact, no focal abnormalities noted Psych:  Normal affect   EKG:  The ECG that was done was personally reviewed and demonstrates SR, normal axis. IVCD (QRS 134).   Relevant CV Studies: TTE 07/09/19: 1. Left ventricular ejection fraction, by estimation, is <20%. The left  ventricle has severely decreased function. The left ventrical demonstrates  global hypokinesis. The left ventricular internal cavity size was mildly  dilated. Left ventricular diastolic parameters are indeterminate.  2. Right ventricular systolic function is moderately reduced. The right  ventricular size is mildly enlarged. There is moderately elevated  pulmonary artery systolic pressure. The estimated right ventricular  systolic pressure is 01.7 mmHg.  3. Mild to moderate mitral valve regurgitation.  4. Tricuspid valve regurgitation is severe.  5. The aortic valve is tricuspid. Mild aortic valve stenosis, mean  gradient only 6 mmHg but visually at least mild AS. No aortic  insufficiency.  6. Aortic dilatation noted. There is mild dilatation of the aortic root  measuring 41  mm.  7. Dilated IVC suggesting RA pressure 15 mmHg.  8. Rhythm difficult to discern, may be atrial fibrillation.  TTE 04/02/19: 1. Left ventricular ejection fraction, by visual estimation, is 40 to 45%. The left ventricle has moderately decreased function. Left ventricular septal wall thickness was normal. Mildly increased left ventricular posterior wall thickness. There is  borderline left ventricular hypertrophy. 2. Inferior and inferoseptal akinesis and thinning. 3. Elevated left atrial and left ventricular end-diastolic pressures. 4. Left ventricular diastolic parameters are consistent with Grade III diastolic dysfunction (restrictive). 5. Global right ventricle has mildly reduced systolic function.The right ventricular size is normal. No increase in right ventricular wall thickness. 6. Left atrial size was severely dilated. 7. Right atrial size was moderately dilated. 8. The mitral valve is abnormal. Mild mitral valve regurgitation. 9. The tricuspid valve is grossly normal. Tricuspid valve regurgitation moderate. 10. Aortic valve area, by VTI measures 1.60 cm. 11. Aortic valve mean gradient measures 8.0 mmHg. 12. Aortic valve peak gradient measures 13.1 mmHg. 13. The aortic valve is tricuspid. Aortic valve regurgitation is trivial. Mild aortic valve stenosis. 14. The pulmonic valve was grossly normal. Pulmonic valve regurgitation is mild. 15. Moderately elevated pulmonary artery systolic pressure. 16. The inferior vena cava is dilated in size with >50% respiratory variability, suggesting right atrial pressure of 8 mmHg. 17. The average left ventricular global longitudinal strain is -10.7 %. 18. A ICD is visualized.  Laboratory Data:  High Sensitivity Troponin:   Recent Labs  Lab 07/18/19 1643 07/20/19 0008 07/20/19 0157 08/16/19 1659 08/16/19 2030  TROPONINIHS 2 3 3  <2 9      Chemistry Recent Labs  Lab 08/13/19 1437 08/16/19 1659  NA 143 141  K 3.8 3.8  CL  103 102  CO2 22 24  GLUCOSE 116* 101*  BUN 52* 60*  CREATININE 1.78* 2.08*  CALCIUM 9.3 9.1  GFRNONAA 34* 28*  GFRAA 39* 32*  ANIONGAP  --  15    Recent Labs  Lab 08/13/19 1437  PROT 6.7  ALBUMIN 4.1  AST 40  ALT 38  ALKPHOS 221*  BILITOT 1.9*  Hematology Recent Labs  Lab 08/13/19 1437 08/16/19 1659  WBC 5.4 5.6  RBC 3.78* 3.64*  HGB 11.5* 11.2*  HCT 35.5* 35.8*  MCV 94 98.4  MCH 30.4 30.8  MCHC 32.4 31.3  RDW 16.2* 17.9*  PLT 95* 90*   BNP Recent Labs  Lab 08/13/19 1437  BNP 1,224.9*    DDimer No results for input(s): DDIMER in the last 168 hours.   Radiology/Studies:  DG Chest 2 View  Result Date: 08/16/2019 CLINICAL DATA:  Dyspnea. Fluid retention. Congestive heart failure with bilateral lower extremity and scrotal edema. EXAM: CHEST - 2 VIEW COMPARISON:  July 18, 2019 FINDINGS: Stable mild cardiomegaly with central vascular congestion and mild thickening of peripheral interlobular septa. No obvious pleural effusion. Redemonstration of mediastinal and upper abdominal surgical clips, intact median sternotomy wires, and single left ventricular lead cardiac defibrillator, similarly positioned with left-sided battery pack. Aortic knob calcified atherosclerosis. Streaky consolidation in the bilateral infrahilar regions, likely atelectatic. No pneumonia or pneumothorax. Skeletal degenerative changes and bony demineralization. IMPRESSION: Stable mild cardiomegaly with mild pulmonary edema and chronic thoracic postoperative changes. No pneumonia or obvious pleural effusion. Aortic calcified atherosclerosis. Electronically Signed   By: Revonda Humphrey   On: 08/16/2019 18:00    Assessment and Plan:   1. Acute Decompensated Systolic HF, LVEF 50%, ICD in situ -- Has demonstrated recurrent hospitalizations with hypotension and now progressive diuretic refractoriness. Likely progressing towards end stage disease.  -- 80mg  IV lasix given now; would plan for 80mg  IV BID to  start with goal net negative ~ 2 L / day.  -- Query whether there was interval coronary event between 04/2019 and 07/2019 that precipitated rapid decline in LVEF? Patient not having anginal symptoms at this time. -- Would try to continue weaning midodrine as able.  -- Consider RHC once closer to euvolemia to better define hemodynamics and to inform prognosis.  -- Daily weight / I/Os strict.  -- Aggressive electrolyte repletions while diuresing.  -- Interrogate device to ensure no VT events and assess AF burden.   2. pAF -- Continue dose reduced apixiban. No plans for procedure until euvolemic.   3. ITP -- Thrombocytopenia stable ~ 90. No s/s of bleeding.   4. CAD.  -- LDL at goal on Crestor.   5. Goals of Care -- Consider re-engaging palliative care during admission.    Severity of Illness: The appropriate patient status for this patient is INPATIENT. Inpatient status is judged to be reasonable and necessary in order to provide the required intensity of service to ensure the patient's safety. The patient's presenting symptoms, physical exam findings, and initial radiographic and laboratory data in the context of their chronic comorbidities is felt to place them at high risk for further clinical deterioration. Furthermore, it is not anticipated that the patient will be medically stable for discharge from the hospital within 2 midnights of admission. The following factors support the patient status of inpatient.   " The patient's presenting symptoms include SOB. " The worrisome physical exam findings include LE edema. . " The initial radiographic and laboratory data are worrisome because of pulmonary edema. " The chronic co-morbidities include advanced age, CKD, ITP.   * I certify that at the point of admission it is my clinical judgment that the patient will require inpatient hospital care spanning beyond 2 midnights from the point of admission due to high intensity of service, high risk  for further deterioration and high frequency of surveillance required.*    For  questions or updates, please contact [place] Please consult [email or web address] for contact info under    Signed, Milus Banister, MD  08/16/2019 10:37 PM   Update 5:30 AM - Minimal UOP to the 80mg  IV lasix. Ordered additional 100mg  IV lasix and would ideally start on a lasix drip once a room is available for him out of the ER to make access to the bedside commode easier and safer. BMP from 3AM with stable Cr.

## 2019-08-17 ENCOUNTER — Encounter: Payer: Self-pay | Admitting: Cardiology

## 2019-08-17 ENCOUNTER — Encounter (HOSPITAL_COMMUNITY): Payer: Self-pay | Admitting: Cardiology

## 2019-08-17 ENCOUNTER — Inpatient Hospital Stay: Payer: Self-pay

## 2019-08-17 DIAGNOSIS — I509 Heart failure, unspecified: Secondary | ICD-10-CM

## 2019-08-17 DIAGNOSIS — I4891 Unspecified atrial fibrillation: Secondary | ICD-10-CM

## 2019-08-17 DIAGNOSIS — N179 Acute kidney failure, unspecified: Secondary | ICD-10-CM

## 2019-08-17 DIAGNOSIS — N184 Chronic kidney disease, stage 4 (severe): Secondary | ICD-10-CM

## 2019-08-17 DIAGNOSIS — I25810 Atherosclerosis of coronary artery bypass graft(s) without angina pectoris: Secondary | ICD-10-CM

## 2019-08-17 DIAGNOSIS — I959 Hypotension, unspecified: Secondary | ICD-10-CM

## 2019-08-17 LAB — BASIC METABOLIC PANEL
Anion gap: 12 (ref 5–15)
BUN: 58 mg/dL — ABNORMAL HIGH (ref 8–23)
CO2: 26 mmol/L (ref 22–32)
Calcium: 9.3 mg/dL (ref 8.9–10.3)
Chloride: 104 mmol/L (ref 98–111)
Creatinine, Ser: 2.08 mg/dL — ABNORMAL HIGH (ref 0.61–1.24)
GFR calc Af Amer: 32 mL/min — ABNORMAL LOW (ref >60)
GFR calc non Af Amer: 28 mL/min — ABNORMAL LOW (ref >60)
Glucose, Bld: 142 mg/dL — ABNORMAL HIGH (ref 70–99)
Potassium: 3.9 mmol/L (ref 3.5–5.1)
Sodium: 142 mmol/L (ref 135–145)

## 2019-08-17 LAB — BRAIN NATRIURETIC PEPTIDE: B Natriuretic Peptide: 1056 pg/mL — ABNORMAL HIGH (ref 0.0–100.0)

## 2019-08-17 LAB — MAGNESIUM: Magnesium: 2.1 mg/dL (ref 1.7–2.4)

## 2019-08-17 LAB — SARS CORONAVIRUS 2 (TAT 6-24 HRS): SARS Coronavirus 2: NEGATIVE

## 2019-08-17 MED ORDER — FUROSEMIDE 10 MG/ML IJ SOLN
100.0000 mg | Freq: Once | INTRAVENOUS | Status: AC
  Administered 2019-08-17: 100 mg via INTRAVENOUS
  Filled 2019-08-17: qty 10

## 2019-08-17 MED ORDER — AMIODARONE HCL IN DEXTROSE 360-4.14 MG/200ML-% IV SOLN
60.0000 mg/h | INTRAVENOUS | Status: AC
  Administered 2019-08-17 (×2): 60 mg/h via INTRAVENOUS
  Filled 2019-08-17: qty 200

## 2019-08-17 MED ORDER — AMIODARONE LOAD VIA INFUSION
150.0000 mg | Freq: Once | INTRAVENOUS | Status: AC
  Administered 2019-08-17: 150 mg via INTRAVENOUS
  Filled 2019-08-17: qty 83.34

## 2019-08-17 MED ORDER — FUROSEMIDE 10 MG/ML IJ SOLN
40.0000 mg | Freq: Once | INTRAMUSCULAR | Status: AC
  Administered 2019-08-17: 40 mg via INTRAVENOUS
  Filled 2019-08-17: qty 4

## 2019-08-17 MED ORDER — MILRINONE LACTATE IN DEXTROSE 20-5 MG/100ML-% IV SOLN
0.2500 ug/kg/min | INTRAVENOUS | Status: DC
  Administered 2019-08-17 – 2019-08-22 (×7): 0.25 ug/kg/min via INTRAVENOUS
  Filled 2019-08-17 (×8): qty 100

## 2019-08-17 MED ORDER — FUROSEMIDE 10 MG/ML IJ SOLN
20.0000 mg/h | INTRAVENOUS | Status: DC
  Administered 2019-08-17 – 2019-08-18 (×2): 12 mg/h via INTRAVENOUS
  Administered 2019-08-19 – 2019-08-20 (×2): 20 mg/h via INTRAVENOUS
  Filled 2019-08-17: qty 21
  Filled 2019-08-17 (×4): qty 25

## 2019-08-17 MED ORDER — POTASSIUM CHLORIDE CRYS ER 20 MEQ PO TBCR
20.0000 meq | EXTENDED_RELEASE_TABLET | Freq: Once | ORAL | Status: AC
  Administered 2019-08-17: 20 meq via ORAL
  Filled 2019-08-17: qty 1

## 2019-08-17 MED ORDER — AMIODARONE HCL IN DEXTROSE 360-4.14 MG/200ML-% IV SOLN
30.0000 mg/h | INTRAVENOUS | Status: DC
  Administered 2019-08-18 – 2019-08-24 (×11): 30 mg/h via INTRAVENOUS
  Filled 2019-08-17 (×12): qty 200

## 2019-08-17 NOTE — Progress Notes (Signed)
Orthopedic Tech Progress Note Patient Details:  Ricardo Payne Sep 29, 1930 [id number]  Ortho Devices Type of Ortho Device: Louretta Parma boot Ortho Device/Splint Location: bilateral unna boots Ortho Device/Splint Interventions: Application   Post Interventions Patient Tolerated: Well Instructions Provided: Care of device   Ricardo Payne 08/17/2019, 6:22 PM

## 2019-08-17 NOTE — Progress Notes (Signed)
Spoke to primary RN re;PICC placement , RN aware PICC will be place tomorrow.

## 2019-08-17 NOTE — Progress Notes (Signed)
RN reviewed fall safety precautions with the pt.  Pt alert and oriented x4.  Pt is refusing bed alarm during this time.

## 2019-08-17 NOTE — Consult Note (Addendum)
Advanced Heart Failure Consultation:   Patient ID: Ricardo Payne; [id number]; 30-Nov-1930   Admit date: 08/16/2019 Date of Consult: 08/17/2019  Primary Care Provider: [name], FNP Primary Cardiologist: Donato Heinz, MD Primary Electrophysiologist:  Cristopher Peru, MD  Patient Profile:   Ricardo Payne is a 86 y.o. male with a PMH of Chronic combined CHF (EF 20%) felt to be 2/2 ischemic cardiomyopathy, CAD s/p CABG, s/p ICD for primary prevention, paroxysmal atrial fibrillation on eliquis, HTN, HLD, CKD stage 4, with multiple admissions for CHF exacerbations , who is being seen today for the evaluation of acute on chronic combined CHF at the request of Dr. Sallyanne Kuster.   History of Present Illness:   Ricardo Payne presented to [facility] 08[facility]16/19 with complaints of worsening SOB and LE edema despite increased torsemide outpatient.   He established care with Dr. Gardiner Rhyme outpatient 03/2019 after moving from [place] to [facility] 01/2019. Symptoms were initially well controlled on bumex, spironolactone, and carvedilol, though addition of ACEi/ARB/ARNI were limited by CKD. He was noted to have hyperkalemia and his spironolactone was subsequently discontinued. He had an echocardiogram in [place] 07/2018 which showed EF 25-30%. Echo 04/2019 showed improvement in EF 40-45%.  He has had several admissions to the hospital for acute on chronic combined CHF since that time. He was admitted 07/06/2019-07/12/2019 for IV diuresis, at which time an echocardiogram was repeated and revealed EF had dropped again, now <20%. His carvedilol was discontinued and he was started on midodrine that admission for hemodynamic support; discharged home on torsemide 10mg  daily. His hospital course was further complicated by acute on chronic renal insufficiency. He was readmitted shortly there after from 07/18/19-07/20/19 for IV diuresis and was discharged home on torsemide 20mg  daily. He has followed with  nephrology since that time with further increase in his torsemide to 20mg  BID. He was last evaluated by cardiology at an outpatient visit with Dr. Gardiner Rhyme 08/13/19 at which time he had significant increase in edema and weight gain of 10lbs, with mild SOB. He was recommended to increase his torsemide to 40mg  BID with low threshold for admission for IV diuresis if no improvement in symptoms. He contacted our office 3/18 to report weight gain, increased swelling and SOB, and was recommended to present to the ED for IV diuresis.   At the time of this evaluation he continues to have SOB, DOE, orthopnea, PND, LE edema and significant scrotal edema. He reports he hasn't felt well since shortly after establishing care with Dr. Gardiner Rhyme 03/2019 when his medications were changed (spironolactone discontinued and bumex changed to torsemide). He sleeps very little at night, usually sleeping in a recliner. He denies palpitations and is historically unaware of his atrial fibrillation. He denies any chest pain, though has not ever experienced this, even prior to his CABG (SOB seems to be anginal equivalent). He reports compliance with his medications and low sodium diet.   Hospital course: Afebrile, intermittently tachycardic and tachypneic, BP stable (max 143/73, low 90-69), satting well on RA. Labs notable for electrolytes wnl, Cr 1.78>2.08, Hgb 11.5, PLT 95 (baseline), BNP 1224>1081, HsTrop negative x2. COVID 19 negative. EKG with sinus rhythm, rate 88 bpm, TWI in I and aVL (chronic), no STE/D. CXR with cardiomegaly and mild pulmonary edema. He was started on IV lasix 80mg  last night and 100mg  this morning. I&Os with marginal urine output. Weight is down to 219lbs today from 221lbs yesterday. He was admitted to cardiology for management of his heart failure. Advanced HF  asked to evaluate.   Past Medical History:  Diagnosis Date  . Acquired thrombophilia ([place])   . Age-related cataract of both eyes   . ASHD  (arteriosclerotic heart disease)   . Benign prostatic hyperplasia   . CAD (coronary artery disease)    a. s/p CABG x1 in 1990  . Cerebellar stroke ([place]) 07/02/2016  . Chronic combined systolic and diastolic CHF (congestive heart failure) ([place])   . Chronic gout without tophus   . CKD (chronic kidney disease), stage IV ([place])   . Essential hypertension   . ICD (implantable cardioverter-defibrillator) in place   . Ischemic cardiomyopathy   . Mixed hyperlipidemia   . PAF (paroxysmal atrial fibrillation) ([place])   . Restless legs   . Secondary pulmonary arterial hypertension ([place])   . Thrombocytopenia ([place])   . Vitamin D deficiency     Past Surgical History:  Procedure Laterality Date  . CHOLECYSTECTOMY    . CORONARY ARTERY BYPASS GRAFT    . PACEMAKER IMPLANT  10/2018     Home Medications:  Prior to Admission medications   Medication Sig Start Date End Date Taking? Authorizing Provider  allopurinol (ZYLOPRIM) 100 MG tablet Take 0.5 tablets (50 mg total) by mouth every other day. 07/12/19  Yes [name], MD  apixaban (ELIQUIS) 2.5 MG TABS tablet Take 2.5 mg by mouth 2 (two) times daily.   Yes [provider]  ergocalciferol (VITAMIN D2) 1.25 MG (50000 UT) capsule Take 50,000 Units by mouth every Sunday.    Yes [provider]  Folic Acid-Vit P8-EUM P53 (FOLBEE) 2.5-25-1 MG TABS tablet Take 1 tablet by mouth daily.   Yes [provider]  midodrine (PROAMATINE) 10 MG tablet Take 1 tablet (10 mg total) by mouth 2 (two) times daily. 08/03/19  Yes Donato Heinz, MD  pramipexole (MIRAPEX) 0.25 MG tablet Take 0.25 mg by mouth 2 (two) times daily.   Yes [provider]  rosuvastatin (CRESTOR) 10 MG tablet Take 1 tablet (10 mg total) by mouth daily. 08/04/19 07/29/20 Yes Weaver, Scott T, PA-C  tamsulosin (FLOMAX) 0.4 MG CAPS capsule Take 1 capsule (0.4 mg total) by mouth daily. 07/21/19  Yes [name], MD  torsemide (DEMADEX) 20 MG tablet Take 2  tablets (40 mg total) by mouth 2 (two) times daily. 08/13/19  Yes Donato Heinz, MD    Inpatient Medications: Scheduled Meds: . allopurinol  50 mg Oral QODAY  . apixaban  2.5 mg Oral BID  . Folic Acid-Vit I1-WER X54  1 tablet Oral Daily  . midodrine  10 mg Oral BID WC  . pramipexole  0.25 mg Oral BID  . rosuvastatin  10 mg Oral q1800  . sodium chloride flush  3 mL Intravenous Q12H  . tamsulosin  0.4 mg Oral Daily   Continuous Infusions: . sodium chloride     PRN Meds: sodium chloride, acetaminophen, sodium chloride flush  Allergies:   No Known Allergies  Social History:   Social History   Socioeconomic History  . Marital status: Widowed    Spouse name: Not on file  . Number of children: 2  . Years of education: Not on file  . Highest education level: Not on file  Occupational History  . Not on file  Tobacco Use  . Smoking status: Former Smoker    Quit date: 1988    Years since quitting: 33.2  . Smokeless tobacco: Never Used  Substance and Sexual Activity  . Alcohol use: Yes    Comment:  rare  . Drug use: Never  . Sexual activity: Not on file  Other Topics Concern  . Not on file  Social History Narrative  . Not on file   Social Determinants of Health   Financial Resource Strain:   . Difficulty of Paying Living Expenses:   Food Insecurity:   . Worried About Charity fundraiser in the Last Year:   . Arboriculturist in the Last Year:   Transportation Needs:   . Film/video editor (Medical):   Marland Kitchen Lack of Transportation (Non-Medical):   Physical Activity:   . Days of Exercise per Week:   . Minutes of Exercise per Session:   Stress:   . Feeling of Stress :   Social Connections:   . Frequency of Communication with Friends and Family:   . Frequency of Social Gatherings with Friends and Family:   . Attends Religious Services:   . Active Member of Clubs or Organizations:   . Attends Archivist Meetings:   Marland Kitchen Marital Status:   Intimate  Partner Violence:   . Fear of Current or Ex-Partner:   . Emotionally Abused:   Marland Kitchen Physically Abused:   . Sexually Abused:     Family History:    Family History  Problem Relation Age of Onset  . Hypertension Mother   . Suicidality Mother   . Valvular heart disease Father   . Heart disease Father   . Heart attack Brother      ROS:  Please see the history of present illness.  ROS  All other ROS reviewed and negative.     Physical Exam/Data:   Vitals:   08/17/19 0915 08/17/19 1050 08/17/19 1100 08/17/19 1230  BP: 104/72 97/68 90/69  113/80  Pulse: 95 (!) 105 96 (!) 108  Resp: (!) 26 20 (!) 22 18  Temp:      TempSrc:      SpO2: 97% 100% 100% 98%  Weight:      Height:        Intake/Output Summary (Last 24 hours) at 08/17/2019 1353 Last data filed at 08/17/2019 0756 Gross per 24 hour  Intake 497.49 ml  Output 100 ml  Net 397.49 ml   Filed Weights   08/16/19 1636  Weight: 100.2 kg   Body mass index is 35.67 kg/m.  General:  Well nourished, well developed, sitting upright in bed in no acute distress HEENT: sclera anicteric  Neck: +JVD Vascular: No carotid bruits; distal pulses 2+ bilaterally Cardiac:  normal S1, S2; IRIR; soft murmur noted a LLSB, no rubs or gallops appreciated Lungs:  clear to auscultation bilaterally, no wheezing, rhonchi or rales  Abd: NABS, soft, nontender, no hepatomegaly Ext: 2+ pitting edema Musculoskeletal:  No deformities, BUE and BLE strength normal and equal Skin: warm and dry; chronic venous stasis skin changes to bilateral LE Neuro:  CNs 2-12 intact, no focal abnormalities noted Psych:  Normal affect   EKG:  The EKG was personally reviewed and demonstrates:  sinus rhythm, rate 88 bpm, TWI in I and aVL (chronic), no STE/D. Telemetry:  Telemetry was personally reviewed and demonstrates:  Atrial fibrillation with rates in the 90s-120s  Relevant CV Studies: TTE 07/09/19: 1. Left ventricular ejection fraction, by estimation, is <20%. The  left  ventricle has severely decreased function. The left ventrical demonstrates  global hypokinesis. The left ventricular internal cavity size was mildly  dilated. Left ventricular  diastolic parameters are indeterminate.  2. Right ventricular systolic function is moderately  reduced. The right  ventricular size is mildly enlarged. There is moderately elevated  pulmonary artery systolic pressure. The estimated right ventricular  systolic pressure is 76.1 mmHg.  3. Mild to moderate mitral valve regurgitation.  4. Tricuspid valve regurgitation is severe.  5. The aortic valve is tricuspid. Mild aortic valve stenosis, mean  gradient only 6 mmHg but visually at least mild AS. No aortic  insufficiency.  6. Aortic dilatation noted. There is mild dilatation of the aortic root  measuring 41 mm.  7. Dilated IVC suggesting RA pressure 15 mmHg.  8. Rhythm difficult to discern, may be atrial fibrillation.   TTE 04/02/19: 1. Left ventricular ejection fraction, by visual estimation, is 40 to 45%. The left ventricle has moderately decreased function. Left ventricular septal wall thickness was normal. Mildly increased left ventricular posterior wall thickness. There is  borderline left ventricular hypertrophy. 2. Inferior and inferoseptal akinesis and thinning. 3. Elevated left atrial and left ventricular end-diastolic pressures. 4. Left ventricular diastolic parameters are consistent with Grade III diastolic dysfunction (restrictive). 5. Global right ventricle has mildly reduced systolic function.The right ventricular size is normal. No increase in right ventricular wall thickness. 6. Left atrial size was severely dilated. 7. Right atrial size was moderately dilated. 8. The mitral valve is abnormal. Mild mitral valve regurgitation. 9. The tricuspid valve is grossly normal. Tricuspid valve regurgitation moderate. 10. Aortic valve area, by VTI measures 1.60 cm. 11. Aortic valve mean  gradient measures 8.0 mmHg. 12. Aortic valve peak gradient measures 13.1 mmHg. 13. The aortic valve is tricuspid. Aortic valve regurgitation is trivial. Mild aortic valve stenosis. 14. The pulmonic valve was grossly normal. Pulmonic valve regurgitation is mild. 15. Moderately elevated pulmonary artery systolic pressure. 16. The inferior vena cava is dilated in size with >50% respiratory variability, suggesting right atrial pressure of 8 mmHg. 17. The average left ventricular global longitudinal strain is -10.7 %. 18. A ICD is visualized.  Laboratory Data:  Chemistry Recent Labs  Lab 08/13/19 1437 08/16/19 1659 08/17/19 0300  NA 143 141 142  K 3.8 3.8 3.9  CL 103 102 104  CO2 22 24 26   GLUCOSE 116* 101* 142*  BUN 52* 60* 58*  CREATININE 1.78* 2.08* 2.08*  CALCIUM 9.3 9.1 9.3  GFRNONAA 34* 28* 28*  GFRAA 39* 32* 32*  ANIONGAP  --  15 12    Recent Labs  Lab 08/13/19 1437 08/16/19 1740  PROT 6.7 6.9  ALBUMIN 4.1 3.7  AST 40 40  ALT 38 37  ALKPHOS 221* 188*  BILITOT 1.9* 2.2*   Hematology Recent Labs  Lab 08/13/19 1437 08/16/19 1659  WBC 5.4 5.6  RBC 3.78* 3.64*  HGB 11.5* 11.2*  HCT 35.5* 35.8*  MCV 94 98.4  MCH 30.4 30.8  MCHC 32.4 31.3  RDW 16.2* 17.9*  PLT 95* 90*   Cardiac EnzymesNo results for input(s): TROPONINI in the last 168 hours. No results for input(s): TROPIPOC in the last 168 hours.  BNP Recent Labs  Lab 08/13/19 1437 08/16/19 1659 08/17/19 0714  BNP 1,224.9* 1,081.1* 1,056.0*    DDimer No results for input(s): DDIMER in the last 168 hours.  Radiology/Studies:  DG Chest 2 View  Result Date: 08/16/2019 CLINICAL DATA:  Dyspnea. Fluid retention. Congestive heart failure with bilateral lower extremity and scrotal edema. EXAM: CHEST - 2 VIEW COMPARISON:  July 18, 2019 FINDINGS: Stable mild cardiomegaly with central vascular congestion and mild thickening of peripheral interlobular septa. No obvious pleural effusion. Redemonstration of  mediastinal and  upper abdominal surgical clips, intact median sternotomy wires, and single left ventricular lead cardiac defibrillator, similarly positioned with left-sided battery pack. Aortic knob calcified atherosclerosis. Streaky consolidation in the bilateral infrahilar regions, likely atelectatic. No pneumonia or pneumothorax. Skeletal degenerative changes and bony demineralization. IMPRESSION: Stable mild cardiomegaly with mild pulmonary edema and chronic thoracic postoperative changes. No pneumonia or obvious pleural effusion. Aortic calcified atherosclerosis. Electronically Signed   By: Revonda Humphrey   On: 08/16/2019 18:00    Assessment and Plan:   1. Acute on chronic combined CHF/biventricular failure: patient presented with SOB, LE edema, orthopnea, and weight gain. BNP elevated to 1081 (down from 2500 07/2019). CXR with mild pulmonary edema. Last echo 07/09/19 showed EF <20%, moderate RV systolic dysfunction with moderately elevated PA pressures, mild-moderate MR, severe TR, and He appears volume overloaded on exam with +JVD and significant scrotal/LE edema. He was started on IV lasix - received 80mg  last night and 100mg  this morning with marginal UOP. Bladder scan revealed only 30cc following UOP of 100cc (reassuring that he is not retaining significantly). Hypotension and CKD have limited GDMT. He has not had a repeat ischemic evaluation since drop in EF so cannot exclude ischemic cardiomyopathy as contributor to the decline in LV function.  - Will place PICC line for CVP/coox monitoring - Will start milrinone 0.21mcg/kg/min - Favor ongoing aggressive diuresis. Will give IV lasix 40mg , followed by infusion of 12mg /hr - Continue to monitor strict I&Os and daily weights - Continue to monitor electrolytes and replete to maintain K >4, Mg >2 - Consider RHC +/- LHC once volume status improved  2. Paroxysmal atrial fibrillation: he has had intermittent Afib with RVR since arrival, with persistent  Afib, rates >100, for the past several hours. Hypotension has limited use of AV nodal blocking agents. ICD was interrogated in the ED, though have not seen the report. Does not appear tachycardia has been the mediator for his cardiomyopathy in the past.  - Will attempt to track down the ICD interrogation report - Will start amiodarone gtt for rate/rhythm control - Continue eliquis for stroke ppx - dose reduced for age/Cr >1.5  3. Hypotension: BP has been fairly stable this admission - Can continue midodrine for now and wean as tolerated  4. CAD s/p CABG in 1990: Never had chest pain prior to CABG. Last ischemic evaluation appears to be a NST in 2017 which showed inferoapical infarction without ischemia. Not on aspirin given need for anticoagulation. Possible he has had a coronary event since 04/2019 given drop in EF since that time.  - Continue statin (LDL at goal - 59 03/2019) - Could consider ischemic evaluation after improvement in volume status  5. CKD stage 4: Cr stable at 2.08, appears to be about baseline - Continue to monitor closely with aggressive diuresis.    40 mg bolus 12/hr; 2.5 midodrine For questions or updates, please contact [place] Please consult [email or web address] for contact info under Cardiology/STEMI.   Signed, Abigail Butts, PA-C  08/17/2019 1:53 PM [phone]  Patient seen with PA, agree with the above note.    History well-outlined above.  Patient was admitted again with decompensated HF in the setting of cardiorenal syndrome.  Creatinine up to 2.08 with attempts at diuresis.  Currently HR in 100s-110s with SBP in 110s-120s.    General: NAD Neck: JVP 16 cm, no thyromegaly or thyroid nodule.  Lungs: Decreased at bases.  CV: Lateral PMI.  Heart irregular S1/S2, no S3/S4, 2/6 HSM LLSB.  2+ edema  to knees.  No carotid bruit.  Unable to palpate pedal pulses.  Abdomen: Soft, nontender, no hepatosplenomegaly, no distention.  Skin: Intact without lesions or  rashes.  Neurologic: Alert and oriented x 3.  Psych: Normal affect. Extremities: No clubbing or cyanosis.  HEENT: Normal.   1. Acute on chronic systolic CHF: Ischemic cardiomyopathy.  St Jude ICD.  Echo in 2/21 with EF <20%, mildly dilated RV with moderately decreased systolic function, mild AS, mild-moderate MR, severe TR.  CHF is complicated by cardiorenal syndrome and recurrent atrial fibrillation with RVR.  Suspect low output HF.  On exam, he is very volume overloaded.  - Place PICC line. Follow CVP and co-ox.  - Start milrinone 0.25 mcg/kg/min.  - Lasix 40 mg IV x 1 then 12 mg/hr gtt.  - Unna boots.  2. CAD: S/p remote CABG (1989).  He does not think he has had a coronary evaluation since that time.  No chest pain. HS-TnI not elevated.  - Continue Crestor.  - No ASA given Eliquis use.  3. Atrial fibrillation: Paroxysmal.  He was admitted with atrial fibrillation with RVR.  This may have triggered his CHF exacerbation (though based on device interrogation, worsening volume status does not seem to necessarily track with atrial fibrillation) or may be a consequence of the exacerbation.  Currently in mild RVR.  - With initiation of milrinone, I will start him on amiodarone gtt for rate control.  - Continue apixaban.  - If he does not convert back to NSR, would consider DCCV before he goes home.  4. AKI on CKD stage 4: Creatinine up from 1.7 to 2.08 with poor diuresis. Suspect cardiorenal syndrome.  Significant volume overload on exam.  - PICC line to follow CVP/co-ox.  - As above, will treat empirically with milrinone, hopefully improved output will allow renal stabilization and better diuresis.  5. PAD: Patient reports history of peripheral intervention though I do not see this is in history.  6. CVA: In past. Likely related to atrial fibrillation.   Loralie Champagne 08/17/2019 4:58 PM

## 2019-08-17 NOTE — ED Notes (Signed)
St. Jude defib interrogated by [employer].

## 2019-08-17 NOTE — Progress Notes (Addendum)
Progress Note  Patient Name: Ricardo Payne Date of Encounter: 08/17/2019  Primary Cardiologist: Donato Heinz, MD   Subjective   Pt denies chest pain. States he gained 11 lbs in 2 days despite increased dose of torsemide. The last time he felt well was when he was on bumex.  He reports orthopnea, leg and scrotal/penile edema.  Inpatient Medications    Scheduled Meds: . allopurinol  50 mg Oral QODAY  . apixaban  2.5 mg Oral BID  . Folic Acid-Vit Z7-QBH A19  1 tablet Oral Daily  . midodrine  10 mg Oral BID WC  . pramipexole  0.25 mg Oral BID  . rosuvastatin  10 mg Oral q1800  . sodium chloride flush  3 mL Intravenous Q12H  . tamsulosin  0.4 mg Oral Daily   Continuous Infusions: . sodium chloride     PRN Meds: sodium chloride, acetaminophen, sodium chloride flush   Vital Signs    Vitals:   08/17/19 0402 08/17/19 0500 08/17/19 0816 08/17/19 0830  BP: 112/80 100/74 (!) 143/73 117/62  Pulse: (!) 102 (!) 108 (!) 112 100  Resp: 18 (!) 24 16 (!) 24  Temp: 98.1 F (36.7 C)     TempSrc: Oral     SpO2: 100% 98% 94% 97%  Weight:      Height:        Intake/Output Summary (Last 24 hours) at 08/17/2019 1001 Last data filed at 08/17/2019 0756 Gross per 24 hour  Intake 497.49 ml  Output 100 ml  Net 397.49 ml   Last 3 Weights 08/16/2019 08/13/2019 08/03/2019  Weight (lbs) 221 lb 226 lb 216 lb 3.2 oz  Weight (kg) 100.245 kg 102.513 kg 98.068 kg      Telemetry    Appears to be sinus to sinus tachydardia with NSVT, PVCs, and intermittent Afib - Personally Reviewed  ECG    Sinus rhythm HR 88, LBBB (old)  - Personally Reviewed  Physical Exam   GEN: No acute distress.   Neck: + JVD Cardiac: irregular rhythm, tachycardic rate Respiratory: Clear to auscultation bilaterally. GI: Soft, nontender, non-distended  MS: B LE edema with erythema and skin changes. Neuro:  Nonfocal  Psych: Normal affect   Labs    High Sensitivity Troponin:   Recent Labs  Lab  07/18/19 1643 07/20/19 0008 07/20/19 0157 08/16/19 1659 08/16/19 2030  TROPONINIHS 2 3 3  <2 9      Chemistry Recent Labs  Lab 08/13/19 1437 08/16/19 1659 08/16/19 1740 08/17/19 0300  NA 143 141  --  142  K 3.8 3.8  --  3.9  CL 103 102  --  104  CO2 22 24  --  26  GLUCOSE 116* 101*  --  142*  BUN 52* 60*  --  58*  CREATININE 1.78* 2.08*  --  2.08*  CALCIUM 9.3 9.1  --  9.3  PROT 6.7  --  6.9  --   ALBUMIN 4.1  --  3.7  --   AST 40  --  40  --   ALT 38  --  37  --   ALKPHOS 221*  --  188*  --   BILITOT 1.9*  --  2.2*  --   GFRNONAA 34* 28*  --  28*  GFRAA 39* 32*  --  32*  ANIONGAP  --  15  --  12     Hematology Recent Labs  Lab 08/13/19 1437 08/16/19 1659  WBC 5.4 5.6  RBC 3.78*  3.64*  HGB 11.5* 11.2*  HCT 35.5* 35.8*  MCV 94 98.4  MCH 30.4 30.8  MCHC 32.4 31.3  RDW 16.2* 17.9*  PLT 95* 90*    BNP Recent Labs  Lab 08/13/19 1437 08/16/19 1659 08/17/19 0714  BNP 1,224.9* 1,081.1* 1,056.0*     DDimer No results for input(s): DDIMER in the last 168 hours.   Radiology    DG Chest 2 View  Result Date: 08/16/2019 CLINICAL DATA:  Dyspnea. Fluid retention. Congestive heart failure with bilateral lower extremity and scrotal edema. EXAM: CHEST - 2 VIEW COMPARISON:  July 18, 2019 FINDINGS: Stable mild cardiomegaly with central vascular congestion and mild thickening of peripheral interlobular septa. No obvious pleural effusion. Redemonstration of mediastinal and upper abdominal surgical clips, intact median sternotomy wires, and single left ventricular lead cardiac defibrillator, similarly positioned with left-sided battery pack. Aortic knob calcified atherosclerosis. Streaky consolidation in the bilateral infrahilar regions, likely atelectatic. No pneumonia or pneumothorax. Skeletal degenerative changes and bony demineralization. IMPRESSION: Stable mild cardiomegaly with mild pulmonary edema and chronic thoracic postoperative changes. No pneumonia or obvious  pleural effusion. Aortic calcified atherosclerosis. Electronically Signed   By: Revonda Humphrey   On: 08/16/2019 18:00    Cardiac Studies   Echo 07/09/19:  1. Left ventricular ejection fraction, by estimation, is <20%. The left  ventricle has severely decreased function. The left ventrical demonstrates  global hypokinesis. The left ventricular internal cavity size was mildly  dilated. Left ventricular  diastolic parameters are indeterminate.   2. Right ventricular systolic function is moderately reduced. The right  ventricular size is mildly enlarged. There is moderately elevated  pulmonary artery systolic pressure. The estimated right ventricular  systolic pressure is 02.5 mmHg.   3. Mild to moderate mitral valve regurgitation.   4. Tricuspid valve regurgitation is severe.   5. The aortic valve is tricuspid. Mild aortic valve stenosis, mean  gradient only 6 mmHg but visually at least mild AS. No aortic  insufficiency.   6. Aortic dilatation noted. There is mild dilatation of the aortic root  measuring 41 mm.   7. Dilated IVC suggesting RA pressure 15 mmHg.   8. Rhythm difficult to discern, may be atrial fibrillation.   Patient Profile     86 y.o. male with NYHA III, ACC C ICM (LVEF 20%, ICD in situ, 3 HF hospitalization this year), CAD s/p CABG, CKD III (BL Cr ~ 2), pAF on dose reduced apixiaban who presents with worsening LE edema and SOB despite increasing oral diuretic doses.   Of note, he moved from [facility] to [place] in 01/2019 to be closer to his step-son. Echo 09/2018 in [place] with EF of 25-30%, which improved to 40-45% on echo here 04/2019. This is his third admission since 07/2019 for CHF.   Assessment & Plan    Acute on chronic systolic and diastolic heart failure Right ventricular dysfunction Ischemic cardiomyopathy AICD in place - St Jude - EF 04/2019 was 40-45%, further reduced on echo 07/2019 with EF of < 20% - on home torsemide 20 mg BID, recently increased by nephrology with  subsequent weight gain - hypotension has precluded use of GDMT for CHF - was volume up at last clinic visit with Dr. Gardiner Rhyme 08/13/19 - in the ER, he was given 80 mg IV lasix without significant diuresis, followed by 100 mg IV lasix with some urination and mild improvement in edema, per the patient - he is having considerable scrotal and penile swelling - will need to watch for  urinary retention - question I&Os in computer - needs more aggressive diuresis, consider lasix drip vs IV bumex - BNP today 1056 (1081) - may need right heart cath this admission - will interrogate device in the ER - will discuss with Dr. Sallyanne Kuster utility of PICC for coox - if we are unable to diurese him, may need to add milrinone and consult heart failure team   Hypotension - started on midodrine during last admission - weaned to 10 mg BID - pressure is tolerating high dose lasix   CKD stage IV - baseline creatinine near 2 - intolerant to spiro due to hyperkalemia, per OSH records - sCr here is 2.08 with K of 3.9 - appreciate nephrology input   Paroxysmal atrial fibrillation - anticoagulated with 2.5 mg eliquis BID for age and renal function - on no rate controlling medications given hypotension - telemetry with bouts of sinus rhythm, NSVT, and Afib RVR - would benefit from addition of a BB when BP can tolerate   Hx of CAD s/p CABG x 1 (1990) - hs troponin x 2 negative   Aortic root dilation - 4.1 cm on echo 07/2019   Consider consult to palliative care for goals of care discussion.       For questions or updates, please contact [facility] Please consult [email or web address] for contact info under        Signed, Ledora Bottcher, PA  08/17/2019, 10:01 AM     I have seen and examined the patient along with Ledora Bottcher, PA .  I have reviewed the chart, notes and new data.  I agree with PA/NP's note.  Key new complaints: This is Mrs. Petz third hospitalization in 3 months for  congestive heart failure.  He has severe hypervolemia with predominantly right heart failure features (leg edema and scrotal edema, weight gain), but also with orthopnea and severely reduced exercise tolerance. Key examination changes: Scrotal/penile edema and 3+ hard pitting lower extremity edema, jugular venous distention to the angle of the jaw, rapid irregular rhythm, holosystolic murmur at the left lower sternal border, early peaking systolic ejection murmur in the aortic focus, no diastolic murmurs Key new findings / data: Recent echocardiogram shows LVEF less than 20%, moderate biatrial dilation, severe tricuspid regurgitation, mild to moderate mitral regurgitation, estimated systolic PA pressure of around 50 mmHg. He has a single-chamber ICD that I showed repeated episodes of thoracic impedance decline consistent with heart failure exacerbation.  Unfortunately, the device cannot tell us the overall burden of atrial fibrillation, but suffice it to say that episodes of rapid ventricular rate are rare.  PLAN: Stage D congestive heart failure with NYHA functional class IIIb.  Clearly hypervolemic, no overt findings of low cardiac output.  Improved renal function, probably at baseline creatinine around 2.0, compared to February when he had acute renal insufficiency with a peak creatinine of 4.69.  History of hyperkalemia preventing the use of spironolactone. Stopped all heart failure medications due to symptomatic hypotension requiring midodrine. The underlying substrate is ischemic cardiomyopathy, but he does not have angina pectoris and has barely detectable high-sensitivity troponin level. He is currently in atrial fibrillation with RVR, but judging by his ICD, RVR has not been a cause for his previous episodes of decompensation.  It may be a consequence rather than a cause of heart failure exacerbation. Discussed further management with aggressive invasive hemodynamic monitoring and use of inotropic  medications to allow diuresis without worsening renal function.  The alternative option would be to  switch to a palliative care approach. He asked several pertinent questions.  He understands that with his age and multiple serious illnesses there may be few options for "fixing" him.  He has been living independently and still has good quality of life, enjoys interactions with his family.  He would like to try the aggressive approach, but if this does not work he understands the option for palliative care.  He even asked about hospice options.  He appears hopeful, but realistic. We will ask for an advanced heart failure service consultation.  Sanda Klein, MD, [place] [phone] 08/17/2019, 12:42 PM

## 2019-08-17 NOTE — ED Notes (Signed)
SDU  Breakfast ordered  

## 2019-08-17 NOTE — Progress Notes (Signed)
Patient needs picc line for CVP monitor, consulted IV team and was told that picc line team is not available in the campus today. They will come and place picc line tomorrow. Notified on call cardiology  Phylliss Bob NP and Daleen Snook PA.  Per Daleen Snook PA, it is Ok to place picc line tomorrow.

## 2019-08-18 ENCOUNTER — Inpatient Hospital Stay (HOSPITAL_COMMUNITY): Payer: Medicare Other

## 2019-08-18 DIAGNOSIS — I5043 Acute on chronic combined systolic (congestive) and diastolic (congestive) heart failure: Secondary | ICD-10-CM

## 2019-08-18 LAB — BASIC METABOLIC PANEL
Anion gap: 10 (ref 5–15)
Anion gap: 15 (ref 5–15)
BUN: 61 mg/dL — ABNORMAL HIGH (ref 8–23)
BUN: 60 mg/dL — ABNORMAL HIGH (ref 8–23)
CO2: 25 mmol/L (ref 22–32)
CO2: 24 mmol/L (ref 22–32)
Calcium: 9 mg/dL (ref 8.9–10.3)
Calcium: 8.8 mg/dL — ABNORMAL LOW (ref 8.9–10.3)
Chloride: 103 mmol/L (ref 98–111)
Chloride: 98 mmol/L (ref 98–111)
Creatinine, Ser: 2.14 mg/dL — ABNORMAL HIGH (ref 0.61–1.24)
Creatinine, Ser: 2.12 mg/dL — ABNORMAL HIGH (ref 0.61–1.24)
GFR calc Af Amer: 31 mL/min — ABNORMAL LOW (ref >60)
GFR calc Af Amer: 32 mL/min — ABNORMAL LOW (ref >60)
GFR calc non Af Amer: 27 mL/min — ABNORMAL LOW (ref >60)
GFR calc non Af Amer: 27 mL/min — ABNORMAL LOW (ref >60)
Glucose, Bld: 133 mg/dL — ABNORMAL HIGH (ref 70–99)
Glucose, Bld: 242 mg/dL — ABNORMAL HIGH (ref 70–99)
Potassium: 3.9 mmol/L (ref 3.5–5.1)
Potassium: 3.6 mmol/L (ref 3.5–5.1)
Sodium: 138 mmol/L (ref 135–145)
Sodium: 137 mmol/L (ref 135–145)

## 2019-08-18 LAB — COOXEMETRY PANEL
Carboxyhemoglobin: 1.8 % — ABNORMAL HIGH (ref 0.5–1.5)
Methemoglobin: 1 % (ref 0.0–1.5)
O2 Saturation: 65.8 %
Total hemoglobin: 10.7 g/dL — ABNORMAL LOW (ref 12.0–16.0)

## 2019-08-18 LAB — MAGNESIUM: Magnesium: 2.1 mg/dL (ref 1.7–2.4)

## 2019-08-18 MED ORDER — POTASSIUM CHLORIDE CRYS ER 20 MEQ PO TBCR
40.0000 meq | EXTENDED_RELEASE_TABLET | Freq: Once | ORAL | Status: AC
  Administered 2019-08-18: 40 meq via ORAL
  Filled 2019-08-18: qty 4

## 2019-08-18 MED ORDER — METOLAZONE 2.5 MG PO TABS
2.5000 mg | ORAL_TABLET | Freq: Once | ORAL | Status: AC
  Administered 2019-08-18: 2.5 mg via ORAL
  Filled 2019-08-18: qty 1

## 2019-08-18 MED ORDER — POTASSIUM CHLORIDE CRYS ER 20 MEQ PO TBCR
40.0000 meq | EXTENDED_RELEASE_TABLET | Freq: Once | ORAL | Status: AC
  Administered 2019-08-18: 40 meq via ORAL
  Filled 2019-08-18: qty 2

## 2019-08-18 MED ORDER — FA-PYRIDOXINE-CYANOCOBALAMIN 2.5-25-2 MG PO TABS
1.0000 | ORAL_TABLET | Freq: Every day | ORAL | Status: DC
  Administered 2019-08-18 – 2019-09-10 (×24): 1 via ORAL
  Filled 2019-08-18 (×26): qty 1

## 2019-08-18 MED ORDER — CHLORHEXIDINE GLUCONATE CLOTH 2 % EX PADS
6.0000 | MEDICATED_PAD | Freq: Every day | CUTANEOUS | Status: DC
  Administered 2019-08-18 – 2019-08-30 (×11): 6 via TOPICAL

## 2019-08-18 MED ORDER — SODIUM CHLORIDE 0.9% FLUSH: 10.0000 mL | INTRAVENOUS | Status: DC | PRN

## 2019-08-18 MED ORDER — SODIUM CHLORIDE 0.9% FLUSH
10.0000 mL | Freq: Two times a day (BID) | INTRAVENOUS | Status: DC
  Administered 2019-08-18 – 2019-08-22 (×8): 10 mL
  Administered 2019-08-23: 20 mL
  Administered 2019-08-23 – 2019-08-25 (×3): 10 mL
  Administered 2019-08-26: 15 mL
  Administered 2019-08-27 – 2019-08-28 (×3): 10 mL
  Administered 2019-08-28: 20 mL
  Administered 2019-08-29: 10 mL
  Administered 2019-08-29: 20 mL
  Administered 2019-08-30 – 2019-09-10 (×20): 10 mL

## 2019-08-18 NOTE — Progress Notes (Signed)
Spoke with Rona Ravens RN re PICC order.  Plan to place PICC this am.

## 2019-08-18 NOTE — Progress Notes (Signed)
Patient ID: Ricardo Payne, male   DOB: 1930-10-09, 86 y.o.   MRN: [id number]     Advanced Heart Failure Rounding Note  PCP-Cardiologist: Donato Heinz, MD   Subjective:    Patient is now on milrinone 0.25 mcg/kg/min and Lasix gtt 12 mg/hr.  UOP not vigorous still.  Creatinine 2.08 => 2.14.    He remains in atrial fibrillation rate 100s, had a couple runs NSVT last night.    Objective:   Weight Range: 100.2 kg Body mass index is 35.64 kg/m.   Vital Signs:   Temp:  [97.7 F (36.5 C)-97.8 F (36.6 C)] 97.7 F (36.5 C) (03/20 0824) Pulse Rate:  [55-113] 96 (03/20 0400) Resp:  [16-32] 20 (03/20 0400) BP: (90-130)/(65-89) 120/77 (03/20 0321) SpO2:  [90 %-100 %] 98 % (03/20 0323) Weight:  [99.7 kg-100.2 kg] 100.2 kg (03/20 0330) Last BM Date: 08/16/19  Weight change: Filed Weights   08/16/19 1636 08/17/19 1530 08/18/19 0330  Weight: 100.2 kg 99.7 kg 100.2 kg    Intake/Output:   Intake/Output Summary (Last 24 hours) at 08/18/2019 0908 Last data filed at 08/18/2019 0647 Gross per 24 hour  Intake 1083.61 ml  Output 950 ml  Net 133.61 ml      Physical Exam    General:  Well appearing. No resp difficulty HEENT: Normal Neck: Supple. JVP 16 cm. Carotids 2+ bilat; no bruits. No lymphadenopathy or thyromegaly appreciated. Cor: PMI lateral. Irregular rate & rhythm. No rubs, gallops.  2/6 SEM RUSB. Lungs: Clear Abdomen: Soft, nontender, nondistended. No hepatosplenomegaly. No bruits or masses. Good bowel sounds. Extremities: No cyanosis, clubbing, rash. 1+ edema to knees.  Neuro: Alert & orientedx3, cranial nerves grossly intact. moves all 4 extremities w/o difficulty. Affect pleasant   Telemetry   Atrial fibrillation rate 100s, couple NSVT runs (short). Personally reviewed.    Labs    CBC Recent Labs    08/16/19 1659  WBC 5.6  HGB 11.2*  HCT 35.8*  MCV 98.4  PLT 90*   Basic Metabolic Panel Recent Labs    08/17/19 0300 08/18/19 0022  NA 142  138  K 3.9 3.9  CL 104 103  CO2 26 25  GLUCOSE 142* 133*  BUN 58* 61*  CREATININE 2.08* 2.14*  CALCIUM 9.3 9.0  MG 2.1 2.1   Liver Function Tests Recent Labs    08/16/19 1740  AST 40  ALT 37  ALKPHOS 188*  BILITOT 2.2*  PROT 6.9  ALBUMIN 3.7   No results for input(s): LIPASE, AMYLASE in the last 72 hours. Cardiac Enzymes No results for input(s): CKTOTAL, CKMB, CKMBINDEX, TROPONINI in the last 72 hours.  BNP: BNP (last 3 results) Recent Labs    08/13/19 1437 08/16/19 1659 08/17/19 0714  BNP 1,224.9* 1,081.1* 1,056.0*    ProBNP (last 3 results) Recent Labs    05/21/19 1535  PROBNP 6,191*     D-Dimer No results for input(s): DDIMER in the last 72 hours. Hemoglobin A1C No results for input(s): HGBA1C in the last 72 hours. Fasting Lipid Panel No results for input(s): CHOL, HDL, LDLCALC, TRIG, CHOLHDL, LDLDIRECT in the last 72 hours. Thyroid Function Tests No results for input(s): TSH, T4TOTAL, T3FREE, THYROIDAB in the last 72 hours.  Invalid input(s): FREET3  Other results:   Imaging    [place] EKG SITE RITE  Result Date: 08/17/2019 If Site Rite image not attached, placement could not be confirmed due to current cardiac rhythm.     Medications:     Scheduled  Medications: . allopurinol  50 mg Oral QODAY  . apixaban  2.5 mg Oral BID  . Folic Acid-Vit T0-WIO X73  1 tablet Oral Daily  . metolazone  2.5 mg Oral Once  . midodrine  10 mg Oral BID WC  . potassium chloride  40 mEq Oral Once  . pramipexole  0.25 mg Oral BID  . rosuvastatin  10 mg Oral q1800  . sodium chloride flush  3 mL Intravenous Q12H  . tamsulosin  0.4 mg Oral Daily     Infusions: . sodium chloride    . amiodarone 30 mg/hr (08/18/19 0533)  . furosemide (LASIX) infusion 12 mg/hr (08/17/19 2056)  . milrinone 0.125 mcg/kg/min (08/18/19 0033)     PRN Medications:  sodium chloride, acetaminophen, sodium chloride flush   Assessment/Plan   1. Acute on chronic systolic CHF:  Ischemic cardiomyopathy.  St Jude ICD.  Echo in 2/21 with EF <20%, mildly dilated RV with moderately decreased systolic function, mild AS, mild-moderate MR, severe TR.  CHF is complicated by cardiorenal syndrome and recurrent atrial fibrillation with RVR.  Suspect low output HF.  He remains volume overloaded on exam.  Empirically started on milrinone 0.25 on 3/19, UOP still not vigorous.  Creatinine fairly stable today 2.08 => 2.14.  - Place PICC line. Follow CVP and co-ox.  - Continue milrinone 0.25 mcg/kg/min.  - He is on midodrine 10 bid. BP is stable.  - Continue Lasix 12 mg/hr gtt. Will give a dose of metolazone 2.5 x 1 today.  - Replace K.  - Unna boots.  2. CAD: S/p remote CABG (1989).  He does not think he has had a coronary evaluation since that time.  No chest pain. HS-TnI not elevated.  - Continue Crestor.  - No ASA given Eliquis use.  3. Atrial fibrillation: Paroxysmal.  He was admitted with atrial fibrillation with RVR.  This may have triggered his CHF exacerbation (though based on device interrogation, worsening volume status does not seem to necessarily track with atrial fibrillation) or may be a consequence of the exacerbation.  Currently rate in 100s.   - Continue amiodarone gtt for rate control.  - Continue apixaban.  - If he does not convert back to NSR, would consider DCCV before he goes home.  4. AKI on CKD stage 4: Creatinine up from 1.7 to 2.1 with poor diuresis. Suspect cardiorenal syndrome.  Significant volume overload on exam.  - PICC line to follow CVP/co-ox.  - As above, treating empirically with milrinone, hopefully improved output will allow renal stabilization and better diuresis.  5. PAD: Patient reports history of peripheral intervention though I do not see this is in history.  6. CVA: In past. Likely related to atrial fibrillation.   Length of Stay: 2  Loralie Champagne, MD  08/18/2019, 9:08 AM  Advanced Heart Failure Team Pager [phone] (M-F; 7a - 4p)  Please  contact [facility] for night-coverage after hours (4p -7a ) and weekends on [email or web address]

## 2019-08-18 NOTE — Progress Notes (Signed)
Peripherally Inserted Central Catheter Placement  The IV Nurse has discussed with the patient and/or persons authorized to consent for the patient, the purpose of this procedure and the potential benefits and risks involved with this procedure.  The benefits include less needle sticks, lab draws from the catheter, and the patient may be discharged home with the catheter. Risks include, but not limited to, infection, bleeding, blood clot (thrombus formation), and puncture of an artery; nerve damage and irregular heartbeat and possibility to perform a PICC exchange if needed/ordered by physician.  Alternatives to this procedure were also discussed.  Bard Power PICC patient education guide, fact sheet on infection prevention and patient information card has been provided to patient /or left at bedside.    PICC Placement Documentation  PICC Double Lumen 23/36/12 PICC Right Basilic 40 cm 1 cm (Active)  Indication for Insertion or Continuance of Line Vasoactive infusions;Chronic illness with exacerbations (CF, Sickle Cell, etc.) 08/18/19 1003  Exposed Catheter (cm) 1 cm 08/18/19 1003  Site Assessment Clean;Dry;Intact 08/18/19 1003  Lumen #1 Status Flushed;Saline locked;Blood return noted 08/18/19 1003  Lumen #2 Status Flushed;Saline locked;Blood return noted 08/18/19 1003  Dressing Type Transparent 08/18/19 1003  Dressing Status Clean;Dry;Intact;Antimicrobial disc in place 08/18/19 [place] checked and tightened 08/18/19 1003  Line Adjustment (NICU/IV Team Only) No 08/18/19 1003  Dressing Intervention New dressing 08/18/19 1003  Dressing Change Due 08/25/19 08/18/19 1003       Rolena Infante 08/18/2019, 10:04 AM

## 2019-08-18 NOTE — Progress Notes (Signed)
Rona Ravens RN notified the PICC is in appropriate position and good to use.  Aware to remove PIV x 2.

## 2019-08-19 LAB — BASIC METABOLIC PANEL
Anion gap: 13 (ref 5–15)
BUN: 59 mg/dL — ABNORMAL HIGH (ref 8–23)
CO2: 24 mmol/L (ref 22–32)
Calcium: 8.8 mg/dL — ABNORMAL LOW (ref 8.9–10.3)
Chloride: 101 mmol/L (ref 98–111)
Creatinine, Ser: 2.09 mg/dL — ABNORMAL HIGH (ref 0.61–1.24)
GFR calc Af Amer: 32 mL/min — ABNORMAL LOW (ref >60)
GFR calc non Af Amer: 28 mL/min — ABNORMAL LOW (ref >60)
Glucose, Bld: 144 mg/dL — ABNORMAL HIGH (ref 70–99)
Potassium: 3.9 mmol/L (ref 3.5–5.1)
Sodium: 138 mmol/L (ref 135–145)

## 2019-08-19 LAB — COOXEMETRY PANEL
Carboxyhemoglobin: 1.7 % — ABNORMAL HIGH (ref 0.5–1.5)
Methemoglobin: 1.5 % (ref 0.0–1.5)
O2 Saturation: 63.7 %
Total hemoglobin: 10.6 g/dL — ABNORMAL LOW (ref 12.0–16.0)

## 2019-08-19 LAB — MAGNESIUM: Magnesium: 1.9 mg/dL (ref 1.7–2.4)

## 2019-08-19 MED ORDER — POTASSIUM CHLORIDE CRYS ER 20 MEQ PO TBCR
40.0000 meq | EXTENDED_RELEASE_TABLET | Freq: Two times a day (BID) | ORAL | Status: AC
  Administered 2019-08-19 (×2): 40 meq via ORAL
  Filled 2019-08-19 (×2): qty 2

## 2019-08-19 MED ORDER — MAGNESIUM SULFATE 2 GM/50ML IV SOLN
2.0000 g | Freq: Once | INTRAVENOUS | Status: AC
  Administered 2019-08-19: 2 g via INTRAVENOUS
  Filled 2019-08-19: qty 50

## 2019-08-19 MED ORDER — METOLAZONE 5 MG PO TABS
5.0000 mg | ORAL_TABLET | Freq: Two times a day (BID) | ORAL | Status: AC
  Administered 2019-08-19 (×2): 5 mg via ORAL
  Filled 2019-08-19 (×2): qty 1

## 2019-08-19 NOTE — Plan of Care (Signed)
  Problem: Activity: Goal: Capacity to carry out activities will improve Outcome: Progressing   Problem: Safety: Goal: Ability to remain free from injury will improve Outcome: Progressing   

## 2019-08-19 NOTE — Progress Notes (Addendum)
   08/19/19 1021  Vitals  BP 98/68  MAP (mmHg) 78  Pulse Rate (!) 102  ECG Heart Rate (!) 103  Resp 19  Oxygen Therapy  SpO2 97 %  O2 Device Nasal Cannula  MEWS Score  MEWS Temp 0  MEWS Systolic 1  MEWS Pulse 1  MEWS RR 0  MEWS LOC 0  MEWS Score 2  MEWS Score Color Yellow  MEWS Assessment  Is this an acute change? No   Yellow MEWS with no acute change. Patient remains in Afib with fluctuating HR (low 100's with occasional 110's and 120's); currently managed with cardiac drips. Patient has some dyspnea with exertion contributing to increased RR; however no distress. RN will continue to monitor

## 2019-08-19 NOTE — Progress Notes (Signed)
Patient ID: Ricardo Payne, male   DOB: January 20, 1931, 86 y.o.   MRN: [id number]     Advanced Heart Failure Rounding Note  PCP-Cardiologist: Donato Heinz, MD   Subjective:    Patient is now on milrinone 0.25 mcg/kg/min and Lasix gtt 12 mg/hr, had metolazone 2.5 x 1 yesterday.  UOP not vigorous still.  Creatinine 2.08 => 2.14 => 2.09.  CVP 25 this morning.   He remains in atrial fibrillation rate 100s, amiodarone gtt at 30 mg/hr.     Objective:   Weight Range: 100 kg Body mass index is 35.59 kg/m.   Vital Signs:   Temp:  [97.6 F (36.4 C)-98.8 F (37.1 C)] 97.8 F (36.6 C) (03/21 0747) Pulse Rate:  [93-107] 107 (03/21 0811) Resp:  [17-24] 19 (03/21 0811) BP: (96-122)/(45-76) 122/60 (03/21 0811) SpO2:  [96 %-99 %] 96 % (03/21 0811) Weight:  [100 kg] 100 kg (03/21 0311) Last BM Date: 08/18/19  Weight change: Filed Weights   08/17/19 1530 08/18/19 0330 08/19/19 0311  Weight: 99.7 kg 100.2 kg 100 kg    Intake/Output:   Intake/Output Summary (Last 24 hours) at 08/19/2019 0921 Last data filed at 08/19/2019 0810 Gross per 24 hour  Intake 2070.15 ml  Output 1395 ml  Net 675.15 ml      Physical Exam    General: NAD Neck: JVP 16 cm, no thyromegaly or thyroid nodule.  Lungs: Decreased at bases.  CV: Nondisplaced PMI.  Heart mildly tachy, irregular S1/S2, no S3/S4, 2/6 SEM RUSB.  1+ edema to knees.   Abdomen: Soft, nontender, no hepatosplenomegaly, no distention.  Skin: Intact without lesions or rashes.  Neurologic: Alert and oriented x 3.  Psych: Normal affect. Extremities: No clubbing or cyanosis.  HEENT: Normal.    Telemetry   Atrial fibrillation rate 100s. Personally reviewed.    Labs    CBC Recent Labs    08/16/19 1659  WBC 5.6  HGB 11.2*  HCT 35.8*  MCV 98.4  PLT 90*   Basic Metabolic Panel Recent Labs    08/18/19 0022 08/18/19 0022 08/18/19 1807 08/19/19 0500  NA 138   < > 137 138  K 3.9   < > 3.6 3.9  CL 103   < > 98 101  CO2  25   < > 24 24  GLUCOSE 133*   < > 242* 144*  BUN 61*   < > 60* 59*  CREATININE 2.14*   < > 2.12* 2.09*  CALCIUM 9.0   < > 8.8* 8.8*  MG 2.1  --   --  1.9   < > = values in this interval not displayed.   Liver Function Tests Recent Labs    08/16/19 1740  AST 40  ALT 37  ALKPHOS 188*  BILITOT 2.2*  PROT 6.9  ALBUMIN 3.7   No results for input(s): LIPASE, AMYLASE in the last 72 hours. Cardiac Enzymes No results for input(s): CKTOTAL, CKMB, CKMBINDEX, TROPONINI in the last 72 hours.  BNP: BNP (last 3 results) Recent Labs    08/13/19 1437 08/16/19 1659 08/17/19 0714  BNP 1,224.9* 1,081.1* 1,056.0*    ProBNP (last 3 results) Recent Labs    05/21/19 1535  PROBNP 6,191*     D-Dimer No results for input(s): DDIMER in the last 72 hours. Hemoglobin A1C No results for input(s): HGBA1C in the last 72 hours. Fasting Lipid Panel No results for input(s): CHOL, HDL, LDLCALC, TRIG, CHOLHDL, LDLDIRECT in the last 72 hours. Thyroid Function Tests  No results for input(s): TSH, T4TOTAL, T3FREE, THYROIDAB in the last 72 hours.  Invalid input(s): FREET3  Other results:   Imaging    DG CHEST PORT 1 VIEW  Result Date: 08/18/2019 CLINICAL DATA:  PICC line placement. EXAM: PORTABLE CHEST 1 VIEW COMPARISON:  August 16, 2019. FINDINGS: Stable cardiomegaly. Atherosclerosis of thoracic aorta is noted. Left-sided pacemaker is unchanged in position. Interval placement of right-sided PICC line with distal tip in expected position of the SVC. No pneumothorax or pleural effusion is noted. Lungs are clear. Bony thorax is unremarkable. IMPRESSION: No acute cardiopulmonary abnormality seen. Interval placement of right-sided PICC line with distal tip in expected position of the SVC. Aortic Atherosclerosis (ICD10-I70.0). Electronically Signed   By: Marijo Conception M.D.   On: 08/18/2019 10:31     Medications:     Scheduled Medications: . allopurinol  50 mg Oral QODAY  . apixaban  2.5 mg  Oral BID  . Chlorhexidine Gluconate Cloth  6 each Topical Daily  . folic acid-pyridoxine-cyancobalamin  1 tablet Oral Daily  . metolazone  5 mg Oral BID  . midodrine  10 mg Oral BID WC  . potassium chloride  40 mEq Oral BID  . pramipexole  0.25 mg Oral BID  . rosuvastatin  10 mg Oral q1800  . sodium chloride flush  10-40 mL Intracatheter Q12H  . sodium chloride flush  3 mL Intravenous Q12H  . tamsulosin  0.4 mg Oral Daily    Infusions: . sodium chloride    . amiodarone 30 mg/hr (08/19/19 7939)  . furosemide (LASIX) infusion 12 mg/hr (08/18/19 1629)  . magnesium sulfate bolus IVPB    . milrinone 0.25 mcg/kg/min (08/18/19 2234)    PRN Medications: sodium chloride, acetaminophen, sodium chloride flush, sodium chloride flush   Assessment/Plan   1. Acute on chronic systolic CHF: Ischemic cardiomyopathy.  St Jude ICD.  Echo in 2/21 with EF <20%, mildly dilated RV with moderately decreased systolic function, mild AS, mild-moderate MR, severe TR.  CHF is complicated by cardiorenal syndrome and recurrent atrial fibrillation with RVR.  Suspect low output HF.  He remains volume overloaded on exam.  Empirically started on milrinone 0.25 on 3/19, UOP still not vigorous.  Creatinine stable today at 2.09.  CVP 25 with co-ox 64%. - Continue milrinone 0.25 mcg/kg/min.  - He is on midodrine 10 bid. BP is stable.  - Increase Lasix gtt to 20 mg/hr, will give metolazone 5 mg bid today.  Replace K and Mg.   - Unna boots.  2. CAD: S/p remote CABG (1989).  He does not think he has had a coronary evaluation since that time.  No chest pain. HS-TnI not elevated.  - Continue Crestor.  - No ASA given Eliquis use.  3. Atrial fibrillation: Paroxysmal.  He was admitted with atrial fibrillation with RVR.  This may have triggered his CHF exacerbation (though based on device interrogation, worsening volume status does not seem to necessarily track with atrial fibrillation) or may be a consequence of the  exacerbation.  Currently rate in 100s.   - Continue amiodarone gtt for rate control.  - Continue apixaban.  - If he does not convert back to NSR, would consider DCCV before he goes home.  4. AKI on CKD stage 4: Creatinine stable at 2.09. Suspect cardiorenal syndrome.  Significant volume overload on exam.  - As above, treating empirically with milrinone, hopefully improved output will allow renal stabilization and better diuresis.  5. PAD: Patient reports history of peripheral  intervention though I do not see this is in history.  6. CVA: In past. Likely related to atrial fibrillation.   Length of Stay: 3  Loralie Champagne, MD  08/19/2019, 9:21 AM  Advanced Heart Failure Team Pager [phone] (M-F; [place])  Please contact [facility] for night-coverage after hours (4p -7a ) and weekends on [email or web address]

## 2019-08-19 NOTE — Progress Notes (Signed)
1021 patient report cramping in left wrist and hand, no chest pain, no SOB, no tightness in chest, no left arm pain. VS assessed and stable. A&Ox4. Pt. reported cramping happens often in arms and legs at home. Scheduled K+ supplement given. Upon reassessment at approx 1130 no further cramping.   17 assisted patient OOB to recliner; chair alarm set.

## 2019-08-19 NOTE — Evaluation (Signed)
Physical Therapy Evaluation Patient Details Name: Ricardo Payne MRN: [id number] DOB: [birth year] Today's Date: 08/19/2019   History of Present Illness  Patient is a 86 y/o male who presents with LE swelling and SOB. CXR-pulmonary edema. Admitted with Acute Decompensated Systolic HF. PMH includes CKD, ischemic heart disease, HTN, PAF, HLD, ICD, CAD s/p CABG, two recent admission for ADHF and volume overload (06/2019, 07/2019).  Clinical Impression  Patient presents with decreased activity tolerance, dyspnea on exertion, impaired balance and impaired mobility s/p above. Pt lives alone and reports being Mod I PTA using rollator as needed. Pt reports some days he can walk around the house and other days he cannot breathe or walk. Has supportive son in law that lives close by. Today, pt tolerated ambulation with Min guard assist with use of RW for support. Noted to have 2/4 DOE. Sp02 remained in 90s on RA and HR up to 120s bpm, A-fib. Limited mainly by SOB. Will follow acutely to maximize independence and mobility prior to return home.     Follow Up Recommendations Home health PT;Supervision for mobility/OOB    Equipment Recommendations  None recommended by PT    Recommendations for Other Services       Precautions / Restrictions Precautions Precautions: Fall Precaution Comments: watch HR, A-fib Restrictions Weight Bearing Restrictions: No      Mobility  Bed Mobility               General bed mobility comments: up in chair upon PT arrival.  Transfers Overall transfer level: Needs assistance Equipment used: Rolling walker (2 wheeled) Transfers: Sit to/from Stand Sit to Stand: Min guard         General transfer comment: Min guard for safety. Stood from Youth worker.  Ambulation/Gait Ambulation/Gait assistance: Min guard Gait Distance (Feet): 100 Feet Assistive device: Rolling walker (2 wheeled) Gait Pattern/deviations: Step-through pattern;Decreased stride length;Trunk  flexed   Gait velocity interpretation: 1.31 - 2.62 ft/sec, indicative of limited community ambulator General Gait Details: Slow, mostly steady gait with RW for support; 2/4 DOE. VSS except HR up to 120s bpm. A-fib, Sp02 in 90s on RA.  Stairs            Wheelchair Mobility    Modified Rankin (Stroke Patients Only)       Balance Overall balance assessment: Needs assistance Sitting-balance support: Feet supported;No upper extremity supported Sitting balance-Leahy Scale: Good     Standing balance support: During functional activity Standing balance-Leahy Scale: Poor Standing balance comment: Requires UE support in standing                             Pertinent Vitals/Pain Pain Assessment: No/denies pain    Home Living Family/patient expects to be discharged to:: Private residence Living Arrangements: Alone Available Help at Discharge: Family Type of Home: Apartment Home Access: Level entry     Home Layout: One level Home Equipment: Clinical cytogeneticist - 4 wheels      Prior Function Level of Independence: Independent with assistive device(s)         Comments: Uses rollator, does own ADLs. Recent rehab stay. Has a son in law that lives close by     Hand Dominance   Dominant Hand: Right    Extremity/Trunk Assessment   Upper Extremity Assessment Upper Extremity Assessment: Defer to OT evaluation    Lower Extremity Assessment Lower Extremity Assessment: Generalized weakness(but functional)       Communication  Communication: No difficulties  Cognition Arousal/Alertness: Awake/alert Behavior During Therapy: WFL for tasks assessed/performed Overall Cognitive Status: Within Functional Limits for tasks assessed                                 General Comments: for basic mobility tasks      General Comments General comments (skin integrity, edema, etc.): HR up to 120s bpm, A-fib.    Exercises     Assessment/Plan    PT  Assessment Patient needs continued PT services  PT Problem List Decreased mobility;Decreased balance;Decreased activity tolerance;Cardiopulmonary status limiting activity       PT Treatment Interventions Therapeutic activities;Gait training;Therapeutic exercise;Patient/family education;Balance training;Functional mobility training    PT Goals (Current goals can be found in the Care Plan section)  Acute Rehab PT Goals Patient Stated Goal: to be able to breathe PT Goal Formulation: With patient Time For Goal Achievement: 09/02/19 Potential to Achieve Goals: Good    Frequency Min 3X/week   Barriers to discharge Decreased caregiver support lives alone    Co-evaluation               AM-PAC PT "6 Clicks" Mobility  Outcome Measure Help needed turning from your back to your side while in a flat bed without using bedrails?: None Help needed moving from lying on your back to sitting on the side of a flat bed without using bedrails?: A Little Help needed moving to and from a bed to a chair (including a wheelchair)?: A Little Help needed standing up from a chair using your arms (e.g., wheelchair or bedside chair)?: A Little Help needed to walk in hospital room?: A Little Help needed climbing 3-5 steps with a railing? : A Little 6 Click Score: 19    End of Session Equipment Utilized During Treatment: Gait belt Activity Tolerance: Patient limited by fatigue Patient left: in chair;with call bell/phone within reach;with chair alarm set Nurse Communication: Mobility status PT Visit Diagnosis: Difficulty in walking, not elsewhere classified (R26.2)    Time: 1224-4975 PT Time Calculation (min) (ACUTE ONLY): 27 min   Charges:   PT Evaluation $PT Eval Moderate Complexity: 1 Mod PT Treatments $Gait Training: 8-22 mins        Marisa Severin, PT, DPT Acute Rehabilitation Services Pager [phone] Office [phone]      Marguarite Arbour A Sabra Heck 08/19/2019, 3:26 PM

## 2019-08-20 DIAGNOSIS — Z7189 Other specified counseling: Secondary | ICD-10-CM

## 2019-08-20 DIAGNOSIS — N189 Chronic kidney disease, unspecified: Secondary | ICD-10-CM

## 2019-08-20 DIAGNOSIS — Z515 Encounter for palliative care: Secondary | ICD-10-CM | POA: Diagnosis not present

## 2019-08-20 DIAGNOSIS — I509 Heart failure, unspecified: Secondary | ICD-10-CM | POA: Diagnosis not present

## 2019-08-20 LAB — BASIC METABOLIC PANEL
Anion gap: 12 (ref 5–15)
BUN: 64 mg/dL — ABNORMAL HIGH (ref 8–23)
CO2: 27 mmol/L (ref 22–32)
Calcium: 9 mg/dL (ref 8.9–10.3)
Chloride: 96 mmol/L — ABNORMAL LOW (ref 98–111)
Creatinine, Ser: 2.6 mg/dL — ABNORMAL HIGH (ref 0.61–1.24)
GFR calc Af Amer: 25 mL/min — ABNORMAL LOW (ref >60)
GFR calc non Af Amer: 21 mL/min — ABNORMAL LOW (ref >60)
Glucose, Bld: 154 mg/dL — ABNORMAL HIGH (ref 70–99)
Potassium: 4.4 mmol/L (ref 3.5–5.1)
Sodium: 135 mmol/L (ref 135–145)

## 2019-08-20 LAB — URINALYSIS, ROUTINE W REFLEX MICROSCOPIC
Bilirubin Urine: NEGATIVE
Glucose, UA: NEGATIVE mg/dL
Hgb urine dipstick: NEGATIVE
Ketones, ur: NEGATIVE mg/dL
Leukocytes,Ua: NEGATIVE
Nitrite: NEGATIVE
Protein, ur: NEGATIVE mg/dL
Specific Gravity, Urine: 1.01 (ref 1.005–1.030)
pH: 5 (ref 5.0–8.0)

## 2019-08-20 LAB — COOXEMETRY PANEL
Carboxyhemoglobin: 2.3 % — ABNORMAL HIGH (ref 0.5–1.5)
Methemoglobin: 0.1 % (ref 0.0–1.5)
O2 Saturation: 64.1 %
Total hemoglobin: 11.3 g/dL — ABNORMAL LOW (ref 12.0–16.0)

## 2019-08-20 LAB — MAGNESIUM: Magnesium: 2.3 mg/dL (ref 1.7–2.4)

## 2019-08-20 MED ORDER — FUROSEMIDE 10 MG/ML IJ SOLN
160.0000 mg | Freq: Two times a day (BID) | INTRAVENOUS | Status: DC
  Administered 2019-08-20 – 2019-08-23 (×7): 160 mg via INTRAVENOUS
  Filled 2019-08-20: qty 16
  Filled 2019-08-20: qty 10
  Filled 2019-08-20 (×3): qty 16
  Filled 2019-08-20: qty 10
  Filled 2019-08-20: qty 16
  Filled 2019-08-20: qty 2
  Filled 2019-08-20: qty 16

## 2019-08-20 NOTE — TOC Initial Note (Signed)
Transition of Care [facility]) - Initial/Assessment Note    Patient Details  Name: Ricardo Payne MRN: [id number] Date of Birth: Jun 04, 1930  Transition of Care [facility]) CM/SW Contact:    Trula Ore, [place] Phone Number: 08/20/2019, 12:51 PM  Clinical Narrative:                  CSW spoke with patient at bedside. Patient informed CSW that he is already set up with home health. CSW told patient she would check on which home health agency he is with to continue services prior to discharge. CSW spoke with patients son [name] and he said patient has home health with [facility]. CSW spoke with Malachy Mood from [facility] and confirmed that patient does have services with them. CSW confirmed he will have PT and a nurse for home health.Patient also reports no DME needs at this time. CSW will continue to follow if patient should need oxygen at discharge.   Patient will need Home Health orders for PT, and RN prior to discharge.   Expected Discharge Plan: [place] Barriers to Discharge: Continued Medical Work up   Patient Goals and CMS Choice Patient states their goals for this hospitalization and ongoing recovery are:: home with home health services CMS Medicare.gov Compare Post Acute Care list provided to:: Patient Choice offered to / list presented to : Patient  Expected Discharge Plan and Services Expected Discharge Plan: [place] In-house Referral: Clinical Social Work   Post Acute Care Choice: [place] arrangements for the past 2 months: [place]: PT, RN [place] Agency: [place] Date [place]: 08/20/19 Time HH Agency Contacted: 31 Representative spoke with at [place]: Malachy Mood  Prior Living Arrangements/Services Living arrangements for the past 2 months: [place] with:: Self Patient language and need for interpreter reviewed:: Yes Do you  feel safe going back to the place where you live?: Yes      Need for Family Participation in Patient Care: Yes (Comment) Care giver support system in place?: Yes (comment) Current home services: Home PT, Home RN Criminal Activity/Legal Involvement Pertinent to Current Situation/Hospitalization: No - Comment as needed  Activities of Daily Living Home Assistive Devices/Equipment: Walker (specify type) ADL Screening (condition at time of admission) Patient's cognitive ability adequate to safely complete daily activities?: Yes Is the patient deaf or have difficulty hearing?: No Does the patient have difficulty seeing, even when wearing glasses/contacts?: No Does the patient have difficulty concentrating, remembering, or making decisions?: No Patient able to express need for assistance with ADLs?: Yes Does the patient have difficulty dressing or bathing?: No Independently performs ADLs?: Yes (appropriate for developmental age) Does the patient have difficulty walking or climbing stairs?: Yes Weakness of Legs: Both Weakness of Arms/Hands: Both  Permission Sought/Granted Permission sought to share information with : Case Manager, Customer service manager, Family Supports Permission granted to share information with : Yes, Verbal Permission Granted  Share Information with NAME: [name]  Permission granted to share info w AGENCY: [place] granted to share info w Relationship: Son  Permission granted to share info w Contact Information: [phone]  Emotional Assessment Appearance:: Appears younger than stated age Attitude/Demeanor/Rapport: Gracious Affect (typically observed): Calm Orientation: : Oriented to Self, Oriented to Place, Oriented to  Time,  Oriented to Situation Alcohol / Substance Use: Not Applicable Psych Involvement: No (comment)  Admission diagnosis:  Acute decompensated heart failure (HCC) [I50.9] Acute on chronic congestive heart failure,  unspecified heart failure type [facility]) [I50.9] Patient Active Problem List   Diagnosis Date Noted  . Acute decompensated heart failure ([place]) 08/16/2019  . Heart failure with reduced ejection fraction ([place]) 07/19/2019  . Congestive heart failure ([place]) 07/18/2019  . Palliative care by specialist   . Goals of care, counseling/discussion   . Cirrhosis ([place])   . CKD (chronic kidney disease) 06/25/2019  . Head trauma 06/25/2019  . Blood thinned due to long-term anticoagulant use 06/25/2019  . Anemia 06/25/2019  . Acute kidney injury superimposed on chronic kidney disease ([place]) 05/23/2019  . Chronic combined systolic and diastolic CHF (congestive heart failure) ([place]) 05/23/2019  . Esophageal hypertension 05/23/2019  . Thrombocytopenia ([place]) 05/23/2019  . Acute on chronic HFrEF (heart failure with reduced ejection fraction) ([place]) 05/22/2019  . ICD (implantable cardioverter-defibrillator) in place 04/18/2019   PCP:  [name], FNP Pharmacy:   [id number] - , [place] [address] Phone: [phone] Fax: [place], [place] - [address] [place] [address] Phone: [phone] Fax: [phone]     Social Determinants of Health (SDOH) Interventions    Readmission Risk Interventions No flowsheet data found.

## 2019-08-20 NOTE — Progress Notes (Signed)
CARDIAC REHAB PHASE I   PRE:  Rate/Rhythm: 102 Afib  BP:  Sitting: 119/74      SaO2: 93 RA  MODE:  Ambulation: 150 ft   POST:  Rate/Rhythm: 112 Afib  BP:  Sitting: 125/69    SaO2: 94 RA  Pt agreeable to ambulate, and able to walk 144ft in hallway standby assist with front wheel walker. Pt states ambulation limited due to SOB. Sats maintained >90 throughout the walk. Pt denies pain or dizziness. Pt returned to recliner, call bell and bedside table within reach. Will continue to follow and encourage ambulation.  [id number] Rufina Falco, RN BSN 08/20/2019 11:47 AM

## 2019-08-20 NOTE — Consult Note (Signed)
[place]  Reason for Consultation: AKI, volume overload Requesting Provider: Dr. Aundra Dubin  HPI: Ricardo Payne is an 86 y.o. male with ICM EF 20%, A fib, CAD s/p CABG, s/p ICD, HL, h/o CVA and CKD who is seen for evaluation and management of volume overload in setting of acute decompensated heart failure.   Pt with baseline Cr 1.7-2 it appears.  Follows with heart failure and is currently admitted with vol overload which is not responding to high dose IV loop diuretic + thiazide po + inotrope.  Nephrology is consulted regarding candidacy for dialysis.   Pt states quite independent at baseline -- moved from [facility] in 03/2019 to be close to stepson in midst of increasing health issues but lives alone, shops, drives he tells me.   He has had orthopnea, 20lb weight gain.   He has no dysuria, hematuria, sense of incomplete void.  No NSAIDs  He was admitted in 07/2019 with volume overload and had AKI with Cr peak 4.7 trended down to 2.7 at discharge after diursing.   He had similar admissions without AKI in early 07/2019, 05/2019.   PMH: Past Medical History:  Diagnosis Date  . Acquired thrombophilia ([place])   . Age-related cataract of both eyes   . ASHD (arteriosclerotic heart disease)   . Benign prostatic hyperplasia   . CAD (coronary artery disease)    a. s/p CABG x1 in 1990  . Cerebellar stroke ([place]) 07/02/2016  . Chronic combined systolic and diastolic CHF (congestive heart failure) ([place])   . Chronic gout without tophus   . CKD (chronic kidney disease), stage IV ([place])   . CVA (cerebral vascular accident) ([place]) 2018  . Essential hypertension   . ICD (implantable cardioverter-defibrillator) in place   . Ischemic cardiomyopathy   . Mixed hyperlipidemia   . PAF (paroxysmal atrial fibrillation) ([place])   . Restless legs   . Secondary pulmonary arterial hypertension ([place])   . Thrombocytopenia ([place])   . Vitamin D deficiency    PSH: Past Surgical  History:  Procedure Laterality Date  . CHOLECYSTECTOMY    . CORONARY ARTERY BYPASS GRAFT    . PACEMAKER IMPLANT  10/2018    Past Medical History:  Diagnosis Date  . Acquired thrombophilia ([place])   . Age-related cataract of both eyes   . ASHD (arteriosclerotic heart disease)   . Benign prostatic hyperplasia   . CAD (coronary artery disease)    a. s/p CABG x1 in 1990  . Cerebellar stroke ([place]) 07/02/2016  . Chronic combined systolic and diastolic CHF (congestive heart failure) ([place])   . Chronic gout without tophus   . CKD (chronic kidney disease), stage IV ([place])   . CVA (cerebral vascular accident) ([place]) 2018  . Essential hypertension   . ICD (implantable cardioverter-defibrillator) in place   . Ischemic cardiomyopathy   . Mixed hyperlipidemia   . PAF (paroxysmal atrial fibrillation) ([place])   . Restless legs   . Secondary pulmonary arterial hypertension ([place])   . Thrombocytopenia ([place])   . Vitamin D deficiency     Medications:  I have reviewed the patient's current medications.  Medications Prior to Admission  Medication Sig Dispense Refill  . allopurinol (ZYLOPRIM) 100 MG tablet Take 0.5 tablets (50 mg total) by mouth every other day. 15 tablet 0  . apixaban (ELIQUIS) 2.5 MG TABS tablet Take 2.5 mg by mouth 2 (two) times daily.    . ergocalciferol (VITAMIN D2) 1.25 MG (50000 UT) capsule Take 50,000  Units by mouth every Sunday.     . Folic Acid-Vit V2-NVB T66 (FOLBEE) 2.5-25-1 MG TABS tablet Take 1 tablet by mouth daily.    . midodrine (PROAMATINE) 10 MG tablet Take 1 tablet (10 mg total) by mouth 2 (two) times daily. 90 tablet 0  . pramipexole (MIRAPEX) 0.25 MG tablet Take 0.25 mg by mouth 2 (two) times daily.    . rosuvastatin (CRESTOR) 10 MG tablet Take 1 tablet (10 mg total) by mouth daily. 90 tablet 3  . tamsulosin (FLOMAX) 0.4 MG CAPS capsule Take 1 capsule (0.4 mg total) by mouth daily. 30 capsule 0  . torsemide (DEMADEX) 20 MG tablet Take 2 tablets (40 mg total) by  mouth 2 (two) times daily. 120 tablet 3    ALLERGIES:  No Known Allergies  FAM HX: Family History  Problem Relation Age of Onset  . Hypertension Mother   . Suicidality Mother   . Valvular heart disease Father   . Heart disease Father   . Heart attack Brother     Social History:   reports that he quit smoking about 33 years ago. He has never used smokeless tobacco. He reports current alcohol use. He reports that he does not use drugs.  ROS: 12 system ROS neg except per HPI  Blood pressure (!) 98/59, pulse 98, temperature 97.9 F (36.6 C), temperature source Oral, resp. rate (!) 24, height 5' 6"  (1.676 m), weight 101.7 kg, SpO2 98 %. PHYSICAL EXAM: Gen: elderly man who is alert  Eyes: anicteric, L subconjunctival hemorrage ENT: MMM Neck: supple, elevated JVD upright CV:  irreg irreg in 90s, no rub Abd:  Soft, obese, mildly distended, nontender Lungs: rales to 1/3 lung fields, sl ^ wob moving to bed GU: condom cath with yellow urine in bag Extr: 1+ pitting to knees Neuro: conversant and oriented Skin: ruddy discoloration of the feet but they are warm   Results for orders placed or performed during the hospital encounter of 08/16/19 (from the past 48 hour(s))  Basic metabolic panel     Status: Abnormal   Collection Time: 08/18/19  6:07 PM  Result Value Ref Range   Sodium 137 135 - 145 mmol/L   Potassium 3.6 3.5 - 5.1 mmol/L   Chloride 98 98 - 111 mmol/L   CO2 24 22 - 32 mmol/L   Glucose, Bld 242 (H) 70 - 99 mg/dL    Comment: Glucose reference range applies only to samples taken after fasting for at least 8 hours.   BUN 60 (H) 8 - 23 mg/dL   Creatinine, Ser 2.12 (H) 0.61 - 1.24 mg/dL   Calcium 8.8 (L) 8.9 - 10.3 mg/dL   GFR calc non Af Amer 27 (L) >60 mL/min   GFR calc Af Amer 32 (L) >60 mL/min   Anion gap 15 5 - 15    Comment: Performed at [facility] [address] [place], [place] [address]  Basic metabolic panel     Status: Abnormal   Collection Time:  08/19/19  5:00 AM  Result Value Ref Range   Sodium 138 135 - 145 mmol/L   Potassium 3.9 3.5 - 5.1 mmol/L   Chloride 101 98 - 111 mmol/L   CO2 24 22 - 32 mmol/L   Glucose, Bld 144 (H) 70 - 99 mg/dL    Comment: Glucose reference range applies only to samples taken after fasting for at least 8 hours.   BUN 59 (H) 8 - 23 mg/dL   Creatinine,  Ser 2.09 (H) 0.61 - 1.24 mg/dL   Calcium 8.8 (L) 8.9 - 10.3 mg/dL   GFR calc non Af Amer 28 (L) >60 mL/min   GFR calc Af Amer 32 (L) >60 mL/min   Anion gap 13 5 - 15    Comment: Performed at [facility] [address] [place], [place] [address]  Magnesium     Status: None   Collection Time: 08/19/19  5:00 AM  Result Value Ref Range   Magnesium 1.9 1.7 - 2.4 mg/dL    Comment: Performed at [facility], [place] [address] [place], [place] [address]  .Cooxemetry Panel (carboxy, met, total hgb, O2 sat)     Status: Abnormal   Collection Time: 08/19/19  6:20 AM  Result Value Ref Range   Total hemoglobin 10.6 (L) 12.0 - 16.0 g/dL   O2 Saturation 63.7 %   Carboxyhemoglobin 1.7 (H) 0.5 - 1.5 %   Methemoglobin 1.5 0.0 - 1.5 %    Comment: Performed at [facility] [address], [place], [place] [address]  Basic metabolic panel     Status: Abnormal   Collection Time: 08/20/19  5:00 AM  Result Value Ref Range   Sodium 135 135 - 145 mmol/L   Potassium 4.4 3.5 - 5.1 mmol/L   Chloride 96 (L) 98 - 111 mmol/L   CO2 27 22 - 32 mmol/L   Glucose, Bld 154 (H) 70 - 99 mg/dL    Comment: Glucose reference range applies only to samples taken after fasting for at least 8 hours.   BUN 64 (H) 8 - 23 mg/dL   Creatinine, Ser 2.60 (H) 0.61 - 1.24 mg/dL   Calcium 9.0 8.9 - 10.3 mg/dL   GFR calc non Af Amer 21 (L) >60 mL/min   GFR calc Af Amer 25 (L) >60 mL/min   Anion gap 12 5 - 15    Comment: Performed at [facility] [address], [place], [place] [address]  Magnesium     Status: None   Collection Time: 08/20/19  5:00 AM  Result Value Ref  Range   Magnesium 2.3 1.7 - 2.4 mg/dL    Comment: Performed at [facility], [place] [address] [place], [place] [address]  .Cooxemetry Panel (carboxy, met, total hgb, O2 sat)     Status: Abnormal   Collection Time: 08/20/19  6:15 AM  Result Value Ref Range   Total hemoglobin 11.3 (L) 12.0 - 16.0 g/dL   O2 Saturation 64.1 %   Carboxyhemoglobin 2.3 (H) 0.5 - 1.5 %   Methemoglobin 0.1 0.0 - 1.5 %    Comment: Performed at [facility] [address] [place], [place] [address]    No results found.  Assessment/Plan **AoCHF: CVP 28, not responding to milrinone, high dose diuretics but doses titrated today and possibly will be cardioverted (though decompensations haven't really correlated with A fib).  I discussed a trial of RRT with him should he fail max medication therapies.     I think a trial is ok but given advanced age and quite severe heart failure I would be conservative in what expectations should be.  We discussed both short and long term dialysis and he wishes to proceed should it become necessary.  Perhaps we will see improved renal perfusion with off loading volume as was the case during 07/2019 admission with diuresis.    **CKD 4:  Baseline Cr 2 recently.  RRT discussion per  above. Bladder scan and check UA here.   **Anemia: mild, not in need of ESA.   **CAD h/o CABG, HL: per cardiology  **A fib: may be cardioverted Wed.   Palliative care met with him today -- note pending but what he tells me is do everything to prolong life.    Will follow call with questions.   Justin Mend 08/20/2019, 2:41 PM

## 2019-08-20 NOTE — Progress Notes (Signed)
Patient has been in a-Fib, RR 20-30s with no distress with HR controlled with amio drip.

## 2019-08-20 NOTE — Progress Notes (Addendum)
Patient ID: Ricardo Payne, male   DOB: 10-Nov-1930, 86 y.o.   MRN: [id number]     Advanced Heart Failure Rounding Note  PCP-Cardiologist: Donato Heinz, MD   Subjective:    Yesterday lasix drip increased to 20 mg per hour and metolazone was increased to 5 mg twice a day. Sluggish urine output. Creatinine trending up 2.0>2.6   Remains on milrinone 0.25 mcg + amiodarone 30 mg per hour   Denies SOB. Says he has been drinking lots of fluids.    Objective:   Weight Range: 101.7 kg Body mass index is 36.19 kg/m.   Vital Signs:   Temp:  [97.6 F (36.4 C)-98.3 F (36.8 C)] 98.2 F (36.8 C) (03/22 0350) Pulse Rate:  [92-111] 95 (03/22 0350) Resp:  [19-29] 19 (03/22 0350) BP: (96-128)/(50-82) 102/50 (03/22 0350) SpO2:  [95 %-100 %] 97 % (03/22 0350) Weight:  [101.7 kg] 101.7 kg (03/22 0004) Last BM Date: 08/19/19  Weight change: Filed Weights   08/18/19 0330 08/19/19 0311 08/20/19 0004  Weight: 100.2 kg 100 kg 101.7 kg    Intake/Output:   Intake/Output Summary (Last 24 hours) at 08/20/2019 0727 Last data filed at 08/20/2019 0350 Gross per 24 hour  Intake 1695.85 ml  Output 1250 ml  Net 445.85 ml      Physical Exam   CVP 28-29  General:  In bed  No resp difficulty HEENT: normal Neck: supple. JVP to jaw . Carotids 2+ bilat; no bruits. No lymphadenopathy or thryomegaly appreciated. Cor: PMI nondisplaced. Irregular rate & rhythm. No rubs, gallops or murmurs. Lungs: clear Abdomen: soft, nontender, nondistended. No hepatosplenomegaly. No bruits or masses. Good bowel sounds. Extremities: no cyanosis, clubbing, rash, edema.RUE PICC Neuro: alert & orientedx3, cranial nerves grossly intact. moves all 4 extremities w/o difficulty. Affect pleasant   Telemetry     A fib 100s personally reviewed   Labs    CBC No results for input(s): WBC, NEUTROABS, HGB, HCT, MCV, PLT in the last 72 hours. Basic Metabolic Panel Recent Labs    08/19/19 0500 08/20/19 0500    NA 138 135  K 3.9 4.4  CL 101 96*  CO2 24 27  GLUCOSE 144* 154*  BUN 59* 64*  CREATININE 2.09* 2.60*  CALCIUM 8.8* 9.0  MG 1.9 2.3   Liver Function Tests No results for input(s): AST, ALT, ALKPHOS, BILITOT, PROT, ALBUMIN in the last 72 hours. No results for input(s): LIPASE, AMYLASE in the last 72 hours. Cardiac Enzymes No results for input(s): CKTOTAL, CKMB, CKMBINDEX, TROPONINI in the last 72 hours.  BNP: BNP (last 3 results) Recent Labs    08/13/19 1437 08/16/19 1659 08/17/19 0714  BNP 1,224.9* 1,081.1* 1,056.0*    ProBNP (last 3 results) Recent Labs    05/21/19 1535  PROBNP 6,191*     D-Dimer No results for input(s): DDIMER in the last 72 hours. Hemoglobin A1C No results for input(s): HGBA1C in the last 72 hours. Fasting Lipid Panel No results for input(s): CHOL, HDL, LDLCALC, TRIG, CHOLHDL, LDLDIRECT in the last 72 hours. Thyroid Function Tests No results for input(s): TSH, T4TOTAL, T3FREE, THYROIDAB in the last 72 hours.  Invalid input(s): FREET3  Other results:   Imaging    No results found.   Medications:     Scheduled Medications: . allopurinol  50 mg Oral QODAY  . apixaban  2.5 mg Oral BID  . Chlorhexidine Gluconate Cloth  6 each Topical Daily  . folic acid-pyridoxine-cyancobalamin  1 tablet Oral Daily  .  midodrine  10 mg Oral BID WC  . pramipexole  0.25 mg Oral BID  . rosuvastatin  10 mg Oral q1800  . sodium chloride flush  10-40 mL Intracatheter Q12H  . sodium chloride flush  3 mL Intravenous Q12H  . tamsulosin  0.4 mg Oral Daily    Infusions: . sodium chloride Stopped (08/19/19 1323)  . amiodarone 30 mg/hr (08/19/19 1749)  . furosemide (LASIX) infusion 20 mg/hr (08/20/19 0321)  . milrinone 0.25 mcg/kg/min (08/20/19 0340)    PRN Medications: sodium chloride, acetaminophen, sodium chloride flush, sodium chloride flush   Assessment/Plan   1. Acute on chronic systolic CHF: Ischemic cardiomyopathy.  St Jude ICD.  Echo in  2/21 with EF <20%, mildly dilated RV with moderately decreased systolic function, mild AS, mild-moderate MR, severe TR.  CHF is complicated by cardiorenal syndrome and recurrent atrial fibrillation with RVR.  Suspect low output HF.   CVP 28. Creatinine up to 2.6. Asked him to limit fluid intake. Continue lasix drip at 20 mg per hour.  - Continue milrinone 0.25 mcg/kg/min. CO-OX stable  - He is on midodrine 10 bid. 2. CAD: S/p remote CABG (1989).  He does not think he has had a coronary evaluation since that time.  No chest pain. HS-TnI not elevated.  - Continue Crestor.  - No ASA given Eliquis use.  3. Atrial fibrillation: Paroxysmal.  He was admitted with atrial fibrillation with RVR.  This may have triggered his CHF exacerbation (though based on device interrogation, worsening volume status does not seem to necessarily track with atrial fibrillation) or may be a consequence of the exacerbation.  Currently rate in 100s.   - Continue amiodarone gtt for rate control.  - Continue apixaban.  - If he does not convert back to NSR, would consider DCCV before he goes home.  - Set up DC-CV  4. AKI on CKD stage 4: Creatinine stable trending 2.1>2.6  Suspect cardiorenal syndrome.  - As above, treating empirically with milrinone, hopefully improved output will allow renal stabilization and better diuresis.  5. PAD: Patient reports history of peripheral intervention though I do not see this is in history.  6. CVA: In past. Likely related to atrial fibrillation.   Consult Palliative Care for goals of care. Consult nephrology.  Discussed with Dr Aundra Dubin. Stop lasix drip and start lasix 160 mg twice a day.  - Set up DC-CV TEE for Wednesday.   Length of Stay: 4  Amy Clegg, NP  08/20/2019, 7:27 AM  Advanced Heart Failure Team Pager [phone] (M-F; 7a - 4p)  Please contact [facility] for night-coverage after hours (4p -7a ) and weekends on [email or web address]  Patient seen with NP, agree with the above note.    We have not made much progress.  CVP remains around 28.  I/Os even yesterday despite high dose Lasix gtt + metolazone and milrinone.  HR in 100s, afib.  SBP 90-110s, has been stable.  Co-ox 64%.  Creatinine up to 2.6.   General: NAD Neck: JVP 16 cm, no thyromegaly or thyroid nodule.  Lungs: Clear to auscultation bilaterally with normal respiratory effort. CV: Nondisplaced PMI.  Heart mildly tachy, irregular S1/S2, no S3/S4, 1/6 SEM RUSB.  1+ edema to knees.   Abdomen: Soft, nontender, no hepatosplenomegaly, no distention.  Skin: Intact without lesions or rashes.  Neurologic: Alert and oriented x 3.  Psych: Normal affect. Extremities: No clubbing or cyanosis.  HEENT: Normal.   Poor response so far to milrinone gtt + diuresis.  Co-ox is good at 64% and HR/BP are stable.  Severe cardiorenal syndrome. CVP 28.  - Continue milrinone 0.25  - I will stop Lasix gtt, start Lasix 160 mg IV bid.  Will hold metolazone today.  - If we continue to struggle with volume and creatinine continues to rise, will need to consider HD.  He is 86 yo but very functional and lives alone.  Consulting nephrology today.   He remains in atrial fibrillation with rate in 100s on amiodarone gtt.  Resumption of NSR may help cardiac output, will plan TEE-guided DCCV, scheduled for Wednesday. Will try to titrate down milrinone prior.   Loralie Champagne 08/20/2019 4:02 PM

## 2019-08-20 NOTE — Progress Notes (Signed)
Unna boots removed per patient request, due to loose uncomfortable feeling, upon assessment 3-4 fingers placed in between warps and legs,  no open wound or pressure injury noted

## 2019-08-20 NOTE — Consult Note (Signed)
Consultation Note Date: 08/20/2019   Patient Name: Ricardo Payne  DOB: 1931-05-17  MRN: [id number]  Age / Sex: 86 y.o., male  PCP: [name], FNP Referring Physician: Geralynn Rile, MD  Reason for Consultation: Establishing goals of care  HPI/Patient Profile: 86 y.o. male  with past medical history of chronic combined CHF, ischemic cardiomyopathy EF 20%, CAD s/p CABG, ICD, paroxysmal atrial fibrillation on Eliquis, HTN, HLD, CKD stage IV, multiple admissions for CHF exacerbations admitted on 08/16/2019 with worsening SOB and lower extremity edema. Heart failure team following. Patient with poor progression despite aggressive medical management with high dose lasix gtt, metolazone, milrinone, and amio gtt. Worsening creatinine. Nephrology consulted. Palliative medicine consultation for goals of care.   Clinical Assessment and Goals of Care:  I have reviewed medical records, discussed with care team and met with patient at bedside to discuss goals of care. Patient known to this PMT provider from previous admission. Ricardo Payne is awake, alert, oriented and able to participate in discussion. He is sitting up in recliner.    I introduced Palliative Medicine as specialized medical care for people living with serious illness. It focuses on providing relief from the symptoms and stress of a serious illness. The goal is to improve quality of life for both the patient and the family.  We discussed a brief life review of the patient. Patient is originally from [place]. 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 He lived in [place] for 30+ years but recently moved to [place] in Fall of 2020 to be closer to his stepson, Dwayne. Widowed. Patient lives independently and son checks in on him a few times per week.  This is patient's third hospital admission this year for CHF exacerbation. He was discharged x2 to rehab. He  eventually returned home from rehab but reports he was only home for ~1 week prior to re-admission.   Discussed course of hospitalization including diagnoses, interventions, plan of care. Discussed challenges with managing heart failure with worsening kidney function. Patient acknowledges he is in a 'bad place.' Discussed disease trajectory of heart failure. We discussed nephrology consultation and possible need for dialysis, but concern with candidacy for dialysis with underlying end-stage heart failure and soft blood pressures.   I attempted to elicit values and goals of care important to the patient. He does speak of wanting everything done ("try everything") even if it caused him to die sooner, further explaining that he does not believe there is a difference if he died in the next few weeks versus 3 months. He also explains that he does not wish to be a burden on his son and does not wish to be "handicapped." Quality of life is important to Ricardo Payne. He is hopeful to be able to walk and drive again but also states "I have lived a good life."   Advanced directives and concepts specific to code status were discussed. Patient does not have a documented living will. He would wish for stepson, Dwayne to be documented HCPOA. We discussed  code status and medical recommendation against heroic intervention such as resuscitation/life support with underlying age, end-stage heart failure, and concern that cpr would cause more harm than good at the end of his life. Discussed concept of a natural death and continue medical management but allowing nature to take course if his condition took a turn for the worst. Patient considering DNR code status and shares thoughts of 'let me go' if it came to that. Also sharing he would not wish to put his family through a decision of having to take him off life support.   Ricardo Payne and I then discussed importance of involving son in goals of care discussion. Patient agrees  with f/u with son present tomorrow afternoon.   Questions and concerns were addressed. Therapeutic listening and emotional support provided. Reassured of ongoing support from palliative.   **Spoke with son Dwayne. Dwayne plans to be present for Ricardo Payne meeting tomorrow, 3/23 at 3:30pm.   SUMMARY OF RECOMMENDATIONS    Continue full code/full scope treatment.   Ongoing palliative discussions. F/u meeting planned with patient and son for 3/23 at 3:30pm.  Patient does wish for "everything" to be done including ongoing medical management and consideration of dialysis but also considering limitation of DNR/DNI code status. No decisions made today. Will further discuss code status, AD packet, and MOST form with patient/son tomorrow.   Code Status/Advance Care Planning:  Full code: Ongoing discussions. Patient considering limitations to care including DNR/DNI code status.  Symptom Management:   Per attending  Palliative Prophylaxis:   Bowel Regimen and Frequent Pain Assessment  Additional Recommendations (Limitations, Scope, Preferences):  Full Scope Treatment  Psycho-social/Spiritual:   Desire for further Chaplaincy support:yes  Additional Recommendations: Caregiving  Support/Resources, Compassionate Wean Education and Education on Hospice  Prognosis:   Poor long-term prognosis with end-stage heart failure  Discharge Planning: To Be Determined      Primary Diagnoses: Present on Admission: **None**   I have reviewed the medical record, interviewed the patient and family, and examined the patient. The following aspects are pertinent.  Past Medical History:  Diagnosis Date  . Acquired thrombophilia ([place])   . Age-related cataract of both eyes   . ASHD (arteriosclerotic heart disease)   . Benign prostatic hyperplasia   . CAD (coronary artery disease)    a. s/p CABG x1 in 1990  . Cerebellar stroke ([place]) 07/02/2016  . Chronic combined systolic and diastolic CHF (congestive  heart failure) ([place])   . Chronic gout without tophus   . CKD (chronic kidney disease), stage IV ([place])   . CVA (cerebral vascular accident) ([place]) 2018  . Essential hypertension   . ICD (implantable cardioverter-defibrillator) in place   . Ischemic cardiomyopathy   . Mixed hyperlipidemia   . PAF (paroxysmal atrial fibrillation) ([place])   . Restless legs   . Secondary pulmonary arterial hypertension ([place])   . Thrombocytopenia ([place])   . Vitamin D deficiency    Social History   Socioeconomic History  . Marital status: Widowed    Spouse name: Not on file  . Number of children: 2  . Years of education: Not on file  . Highest education level: Not on file  Occupational History  . Not on file  Tobacco Use  . Smoking status: Former Smoker    Quit date: 1988    Years since quitting: 33.2  . Smokeless tobacco: Never Used  Substance and Sexual Activity  . Alcohol use: Yes    Comment: rare  . Drug use: Never  .  Sexual activity: Not on file  Other Topics Concern  . Not on file  Social History Narrative  . Not on file   Social Determinants of Health   Financial Resource Strain:   . Difficulty of Paying Living Expenses:   Food Insecurity:   . Worried About Charity fundraiser in the Last Year:   . Arboriculturist in the Last Year:   Transportation Needs:   . Film/video editor (Medical):   Marland Kitchen Lack of Transportation (Non-Medical):   Physical Activity:   . Days of Exercise per Week:   . Minutes of Exercise per Session:   Stress:   . Feeling of Stress :   Social Connections:   . Frequency of Communication with Friends and Family:   . Frequency of Social Gatherings with Friends and Family:   . Attends Religious Services:   . Active Member of Clubs or Organizations:   . Attends Archivist Meetings:   Marland Kitchen Marital Status:    Family History  Problem Relation Age of Onset  . Hypertension Mother   . Suicidality Mother   . Valvular heart disease Father   . Heart disease  Father   . Heart attack Brother    Scheduled Meds: . allopurinol  50 mg Oral QODAY  . apixaban  2.5 mg Oral BID  . Chlorhexidine Gluconate Cloth  6 each Topical Daily  . folic acid-pyridoxine-cyancobalamin  1 tablet Oral Daily  . midodrine  10 mg Oral BID WC  . pramipexole  0.25 mg Oral BID  . rosuvastatin  10 mg Oral q1800  . sodium chloride flush  10-40 mL Intracatheter Q12H  . sodium chloride flush  3 mL Intravenous Q12H  . tamsulosin  0.4 mg Oral Daily   Continuous Infusions: . sodium chloride Stopped (08/19/19 1323)  . amiodarone 30 mg/hr (08/19/19 1749)  . furosemide 160 mg (08/20/19 1131)  . milrinone 0.25 mcg/kg/min (08/20/19 1301)   PRN Meds:.sodium chloride, acetaminophen, sodium chloride flush, sodium chloride flush Medications Prior to Admission:  Prior to Admission medications   Medication Sig Start Date End Date Taking? Authorizing Provider  allopurinol (ZYLOPRIM) 100 MG tablet Take 0.5 tablets (50 mg total) by mouth every other day. 07/12/19  Yes [name], MD  apixaban (ELIQUIS) 2.5 MG TABS tablet Take 2.5 mg by mouth 2 (two) times daily.   Yes [provider]  ergocalciferol (VITAMIN D2) 1.25 MG (50000 UT) capsule Take 50,000 Units by mouth every Sunday.    Yes [provider]  Folic Acid-Vit K8-JGO T15 (FOLBEE) 2.5-25-1 MG TABS tablet Take 1 tablet by mouth daily.   Yes [provider]  midodrine (PROAMATINE) 10 MG tablet Take 1 tablet (10 mg total) by mouth 2 (two) times daily. 08/03/19  Yes Donato Heinz, MD  pramipexole (MIRAPEX) 0.25 MG tablet Take 0.25 mg by mouth 2 (two) times daily.   Yes [provider]  rosuvastatin (CRESTOR) 10 MG tablet Take 1 tablet (10 mg total) by mouth daily. 08/04/19 07/29/20 Yes Weaver, Scott T, PA-C  tamsulosin (FLOMAX) 0.4 MG CAPS capsule Take 1 capsule (0.4 mg total) by mouth daily. 07/21/19  Yes [name], MD  torsemide (DEMADEX) 20 MG tablet Take 2 tablets (40 mg total) by  mouth 2 (two) times daily. 08/13/19  Yes Donato Heinz, MD   No Known Allergies Review of Systems  Constitutional: Positive for activity change.  Respiratory: Positive for shortness of breath.    Physical Exam Vitals and  nursing note reviewed.  Constitutional:      General: He is awake.  HENT:     Head: Normocephalic and atraumatic.  Cardiovascular:     Rate and Rhythm: Rhythm irregularly irregular.     Comments: afib on amio gtt Pulmonary:     Effort: No tachypnea, accessory muscle usage or respiratory distress.  Skin:    General: Skin is warm and dry.  Neurological:     Mental Status: He is alert and oriented to person, place, and time.  Psychiatric:        Attention and Perception: Attention normal.        Mood and Affect: Mood normal.        Speech: Speech normal.        Cognition and Memory: Cognition normal.    Vital Signs: BP (!) 98/59   Pulse 98   Temp 97.9 F (36.6 C) (Oral)   Resp (!) 24   Ht 5' 6"  (1.676 m)   Wt 101.7 kg   SpO2 98%   BMI 36.19 kg/m  Pain Scale: 0-10   Pain Score: 0-No pain   SpO2: SpO2: 98 % O2 Device:SpO2: 98 % O2 Flow Rate: .O2 Flow Rate (L/min): 2 L/min  IO: Intake/output summary:   Intake/Output Summary (Last 24 hours) at 08/20/2019 1622 Last data filed at 08/20/2019 1544 Gross per 24 hour  Intake 1929.65 ml  Output 1690 ml  Net 239.65 ml    LBM: Last BM Date: 08/19/19 Baseline Weight: Weight: 100.2 kg Most recent weight: Weight: 101.7 kg     Palliative Assessment/Data: PPS 50%   Flowsheet Rows     Most Recent Value  Intake Tab  Referral Department  Cardiology  Unit at Time of Referral  Cardiac/Telemetry Unit  Palliative Care Primary Diagnosis  Cardiac  Palliative Care Type  Return patient Palliative Care  Reason for referral  Clarify Goals of Care  Date first seen by Palliative Care  08/20/19  Clinical Assessment  Palliative Performance Scale Score  50%  Psychosocial & Spiritual Assessment  Palliative  Care Outcomes  Patient/Family meeting held?  Yes  Who was at the meeting?  patient  Palliative Care Outcomes  Clarified goals of care, Provided psychosocial or spiritual support, ACP counseling assistance      Time In: 1410 Time Out: 1520 Time Total: 70 Greater than 50%  of this time was spent counseling and coordinating care related to the above assessment and plan.  Signed by:  Ihor Dow, DNP, FNP-C Palliative Medicine Team  Phone: [phone] Fax: [phone]   Please contact Palliative Medicine Team phone at [phone] for questions and concerns.  For individual provider: See Shea Evans

## 2019-08-20 NOTE — Plan of Care (Signed)
  Problem: Activity: Goal: Capacity to carry out activities will improve Outcome: Progressing   Problem: Clinical Measurements: Goal: Diagnostic test results will improve Outcome: Progressing   Problem: Activity: Goal: Risk for activity intolerance will decrease Outcome: Progressing   Problem: Safety: Goal: Ability to remain free from injury will improve Outcome: Progressing

## 2019-08-21 DIAGNOSIS — I509 Heart failure, unspecified: Secondary | ICD-10-CM | POA: Diagnosis not present

## 2019-08-21 DIAGNOSIS — Z7189 Other specified counseling: Secondary | ICD-10-CM | POA: Diagnosis not present

## 2019-08-21 DIAGNOSIS — Z515 Encounter for palliative care: Secondary | ICD-10-CM | POA: Diagnosis not present

## 2019-08-21 LAB — COOXEMETRY PANEL
Carboxyhemoglobin: 1.8 % — ABNORMAL HIGH (ref 0.5–1.5)
Methemoglobin: 1 % (ref 0.0–1.5)
O2 Saturation: 61.6 %
Total hemoglobin: 10.5 g/dL — ABNORMAL LOW (ref 12.0–16.0)

## 2019-08-21 LAB — PROTIME-INR
INR: 1.8 — ABNORMAL HIGH (ref 0.8–1.2)
Prothrombin Time: 20.8 seconds — ABNORMAL HIGH (ref 11.4–15.2)

## 2019-08-21 LAB — BASIC METABOLIC PANEL
Anion gap: 13 (ref 5–15)
BUN: 72 mg/dL — ABNORMAL HIGH (ref 8–23)
CO2: 26 mmol/L (ref 22–32)
Calcium: 8.8 mg/dL — ABNORMAL LOW (ref 8.9–10.3)
Chloride: 96 mmol/L — ABNORMAL LOW (ref 98–111)
Creatinine, Ser: 3.09 mg/dL — ABNORMAL HIGH (ref 0.61–1.24)
GFR calc Af Amer: 20 mL/min — ABNORMAL LOW (ref >60)
GFR calc non Af Amer: 17 mL/min — ABNORMAL LOW (ref >60)
Glucose, Bld: 175 mg/dL — ABNORMAL HIGH (ref 70–99)
Potassium: 3.8 mmol/L (ref 3.5–5.1)
Sodium: 135 mmol/L (ref 135–145)

## 2019-08-21 LAB — MAGNESIUM: Magnesium: 2.2 mg/dL (ref 1.7–2.4)

## 2019-08-21 MED ORDER — METOLAZONE 5 MG PO TABS
5.0000 mg | ORAL_TABLET | Freq: Every day | ORAL | Status: DC
  Administered 2019-08-21: 5 mg via ORAL
  Filled 2019-08-21: qty 1

## 2019-08-21 MED ORDER — SORBITOL 70 % SOLN
30.0000 mL | Freq: Once | Status: AC
  Administered 2019-08-21: 30 mL via ORAL
  Filled 2019-08-21: qty 30

## 2019-08-21 MED ORDER — POLYETHYLENE GLYCOL 3350 17 G PO PACK
17.0000 g | PACK | Freq: Every day | ORAL | Status: DC
  Administered 2019-08-21 – 2019-09-11 (×9): 17 g via ORAL
  Filled 2019-08-21 (×16): qty 1

## 2019-08-21 MED ORDER — HEPARIN (PORCINE) 25000 UT/250ML-% IV SOLN
950.0000 [IU]/h | INTRAVENOUS | Status: DC
  Administered 2019-08-21: 1100 [IU]/h via INTRAVENOUS
  Administered 2019-08-22: 850 [IU]/h via INTRAVENOUS
  Administered 2019-08-24 – 2019-08-26 (×3): 900 [IU]/h via INTRAVENOUS
  Administered 2019-08-27: 950 [IU]/h via INTRAVENOUS
  Filled 2019-08-21 (×7): qty 250

## 2019-08-21 MED ORDER — MIDODRINE HCL 5 MG PO TABS
10.0000 mg | ORAL_TABLET | Freq: Three times a day (TID) | ORAL | Status: DC
  Administered 2019-08-21 – 2019-08-31 (×29): 10 mg via ORAL
  Filled 2019-08-21 (×29): qty 2

## 2019-08-21 MED ORDER — METOLAZONE 5 MG PO TABS
5.0000 mg | ORAL_TABLET | Freq: Two times a day (BID) | ORAL | Status: DC
  Administered 2019-08-22: 5 mg via ORAL
  Filled 2019-08-21: qty 1

## 2019-08-21 NOTE — Progress Notes (Signed)
Offered ambulation however pt sts he has just gotten comfortable in bed and declined. Encouraged him to atleast get to recliner later today. [place] CES, ACSM 2:27 PM 08/21/2019

## 2019-08-21 NOTE — Progress Notes (Addendum)
MD paged regarding order for milrinone on the [facility] but no milrinone currently running and previous shift thought it had been discontinued.   [id number] returned page and stated to restart milrinone after giving additional dose of midodrine.

## 2019-08-21 NOTE — Progress Notes (Signed)
Physical Therapy Treatment Patient Details Name: Ricardo Payne MRN: [id number] DOB: [birth year] Today's Date: 08/21/2019    History of Present Illness Patient is a 86 y/o male who presents with LE swelling and SOB. CXR-pulmonary edema. Admitted with Acute Decompensated Systolic HF. PMH includes CKD, ischemic heart disease, HTN, PAF, HLD, ICD, CAD s/p CABG, two recent admission for ADHF and volume overload (06/2019, 07/2019).    PT Comments    Patient seen for mobility progression. Pt tolerated gait distance of 150 ft with RW. VSS on RA and pt reports no SOB with mobility. Pt will continue to benefit from further skilled PT services to maximize independence and safety with mobility.     Follow Up Recommendations  Home health PT;Supervision for mobility/OOB     Equipment Recommendations  None recommended by PT    Recommendations for Other Services       Precautions / Restrictions Precautions Precautions: Fall Precaution Comments: watch HR, A-fib Restrictions Weight Bearing Restrictions: No    Mobility  Bed Mobility               General bed mobility comments: up in chair upon PT arrival.  Transfers Overall transfer level: Needs assistance Equipment used: Rolling walker (2 wheeled) Transfers: Sit to/from Stand Sit to Stand: Min guard         General transfer comment: min guard for safety  Ambulation/Gait Ambulation/Gait assistance: Min guard Gait Distance (Feet): 150 Feet Assistive device: Rolling walker (2 wheeled) Gait Pattern/deviations: Step-through pattern;Decreased stride length;Trunk flexed Gait velocity: decreased   General Gait Details: cues for upright posture and proximity to RW; SpO2 >90% on RA and denies feeling SOB   Stairs             Wheelchair Mobility    Modified Rankin (Stroke Patients Only)       Balance Overall balance assessment: Needs assistance Sitting-balance support: Feet supported;No upper extremity  supported Sitting balance-Leahy Scale: Good     Standing balance support: During functional activity Standing balance-Leahy Scale: Poor                              Cognition Arousal/Alertness: Awake/alert Behavior During Therapy: WFL for tasks assessed/performed Overall Cognitive Status: Within Functional Limits for tasks assessed                                 General Comments: for basic mobility tasks      Exercises      General Comments        Pertinent Vitals/Pain Pain Assessment: No/denies pain    Home Living                      Prior Function            PT Goals (current goals can now be found in the care plan section) Progress towards PT goals: Progressing toward goals    Frequency    Min 3X/week      PT Plan Current plan remains appropriate    Co-evaluation              AM-PAC PT "6 Clicks" Mobility   Outcome Measure  Help needed turning from your back to your side while in a flat bed without using bedrails?: None Help needed moving from lying on your back to sitting on the side of a flat  bed without using bedrails?: A Little Help needed moving to and from a bed to a chair (including a wheelchair)?: A Little Help needed standing up from a chair using your arms (e.g., wheelchair or bedside chair)?: A Little Help needed to walk in hospital room?: A Little Help needed climbing 3-5 steps with a railing? : A Little 6 Click Score: 19    End of Session Equipment Utilized During Treatment: Gait belt Activity Tolerance: Patient tolerated treatment well Patient left: with call bell/phone within reach;in bed;Other (comment)(pt sitting EOB eating lunch) Nurse Communication: Mobility status PT Visit Diagnosis: Difficulty in walking, not elsewhere classified (R26.2)     Time: 5397-6734 PT Time Calculation (min) (ACUTE ONLY): 40 min  Charges:  $Gait Training: 23-37 mins                     Ricardo Payne,  PTA Acute Rehabilitation Services Pager: [phone] Office: [phone]     Darliss Cheney 08/21/2019, 4:37 PM

## 2019-08-21 NOTE — Progress Notes (Addendum)
Patient ID: Ricardo Payne, male   DOB: 12/19/1930, 86 y.o.   MRN: [id number]     Advanced Heart Failure Rounding Note  PCP-Cardiologist: Donato Heinz, MD   Subjective:    Yesterday switched to lasix 160 mg twice a day  Creatinine trending up 2.0>2.6>3.1    Remains on milrinone 0.25 mcg + amiodarone 30 mg per hour    Had trouble breathing last night    Objective:   Weight Range: 101.4 kg Body mass index is 36.07 kg/m.   Vital Signs:   Temp:  [97.6 F (36.4 C)-98 F (36.7 C)] 98 F (36.7 C) (03/23 0434) Pulse Rate:  [89-106] 102 (03/23 0715) Resp:  [12-31] 18 (03/23 0715) BP: (75-128)/(49-96) 128/68 (03/23 0700) SpO2:  [92 %-100 %] 97 % (03/23 0715) Weight:  [101.4 kg] 101.4 kg (03/23 0034) Last BM Date: 08/19/19  Weight change: Filed Weights   08/19/19 0311 08/20/19 0004 08/21/19 0034  Weight: 100 kg 101.7 kg 101.4 kg    Intake/Output:   Intake/Output Summary (Last 24 hours) at 08/21/2019 0738 Last data filed at 08/21/2019 0430 Gross per 24 hour  Intake 1442.59 ml  Output 975 ml  Net 467.59 ml      Physical Exam   CVP 26-27 General:  No resp difficulty HEENT: normal Neck: supple. JVP to jaw . Carotids 2+ bilat; no bruits. No lymphadenopathy or thryomegaly appreciated. Cor: PMI nondisplaced. Irregular rate & rhythm. No rubs, gallops or murmurs. Lungs: clear Abdomen: soft, nontender, nondistended. No hepatosplenomegaly. No bruits or masses. Good bowel sounds. Extremities: no cyanosis, clubbing, rash,  R and LLE 1-2+edema  RUE PICC Neuro: alert & orientedx3, cranial nerves grossly intact. moves all 4 extremities w/o difficulty. Affect pleasant  Telemetry     A Fib 90-100s personally reviewed   Labs    CBC No results for input(s): WBC, NEUTROABS, HGB, HCT, MCV, PLT in the last 72 hours. Basic Metabolic Panel Recent Labs    08/20/19 0500 08/21/19 0546  NA 135 135  K 4.4 3.8  CL 96* 96*  CO2 27 26  GLUCOSE 154* 175*  BUN 64* 72*    CREATININE 2.60* 3.09*  CALCIUM 9.0 8.8*  MG 2.3 2.2   Liver Function Tests No results for input(s): AST, ALT, ALKPHOS, BILITOT, PROT, ALBUMIN in the last 72 hours. No results for input(s): LIPASE, AMYLASE in the last 72 hours. Cardiac Enzymes No results for input(s): CKTOTAL, CKMB, CKMBINDEX, TROPONINI in the last 72 hours.  BNP: BNP (last 3 results) Recent Labs    08/13/19 1437 08/16/19 1659 08/17/19 0714  BNP 1,224.9* 1,081.1* 1,056.0*    ProBNP (last 3 results) Recent Labs    05/21/19 1535  PROBNP 6,191*     D-Dimer No results for input(s): DDIMER in the last 72 hours. Hemoglobin A1C No results for input(s): HGBA1C in the last 72 hours. Fasting Lipid Panel No results for input(s): CHOL, HDL, LDLCALC, TRIG, CHOLHDL, LDLDIRECT in the last 72 hours. Thyroid Function Tests No results for input(s): TSH, T4TOTAL, T3FREE, THYROIDAB in the last 72 hours.  Invalid input(s): FREET3  Other results:   Imaging    No results found.   Medications:     Scheduled Medications: . allopurinol  50 mg Oral QODAY  . apixaban  2.5 mg Oral BID  . Chlorhexidine Gluconate Cloth  6 each Topical Daily  . folic acid-pyridoxine-cyancobalamin  1 tablet Oral Daily  . midodrine  10 mg Oral BID WC  . pramipexole  0.25 mg Oral  BID  . rosuvastatin  10 mg Oral q1800  . sodium chloride flush  10-40 mL Intracatheter Q12H  . sodium chloride flush  3 mL Intravenous Q12H  . tamsulosin  0.4 mg Oral Daily    Infusions: . sodium chloride Stopped (08/19/19 1323)  . amiodarone 30 mg/hr (08/20/19 1848)  . furosemide 160 mg (08/20/19 1751)  . milrinone 0.25 mcg/kg/min (08/20/19 1301)    PRN Medications: sodium chloride, acetaminophen, sodium chloride flush, sodium chloride flush   Assessment/Plan   1. Acute on chronic systolic CHF: Ischemic cardiomyopathy.  St Jude ICD.  Echo in 2/21 with EF <20%, mildly dilated RV with moderately decreased systolic function, mild AS, mild-moderate  MR, severe TR.  CHF is complicated by cardiorenal syndrome and recurrent atrial fibrillation with RVR.  Suspect low output HF.   CVP 26-27. Creatinine up to 31 . Asked him to limit fluid intake.  - Continue lasix 160 mg twice a day and will add metolazone 5 mg bid.   - Continue milrinone 0.25 mcg/kg/min. CO-OX 62%  - He is on midodrine 10 bid. 2. CAD: S/p remote CABG (1989).  He does not think he has had a coronary evaluation since that time.  No chest pain. HS-TnI not elevated.  - Continue Crestor.  - No ASA given Eliquis use.  3. Atrial fibrillation: Paroxysmal.  He was admitted with atrial fibrillation with RVR.  This may have triggered his CHF exacerbation (though based on device interrogation, worsening volume status does not seem to necessarily track with atrial fibrillation) or may be a consequence of the exacerbation.  Currently rate in 100s.   - Continue amiodarone gtt for rate control.  - Continue apixaban => transition to heparin to allow pause for HD catheter.  - TEE DC/CV set up for tomorrow at 1200 4. AKI on CKD stage 4: Creatinine stable trending 2.1>2.6 >3.1  Suspect cardiorenal syndrome.  Nephrology consulted- May need HD. He would like to pursue if needed  5. PAD: Patient reports history of peripheral intervention though I do not see this is in history.  6. CVA: In past. Likely related to atrial fibrillation.   Consult Palliative Care for goals of care.  Nephrology appreciated Set up DC-CV TEE for Wednesday.   Length of Stay: 5  Amy Clegg, NP  08/21/2019, 7:38 AM  Advanced Heart Failure Team Pager [phone] (M-F; 7a - 4p)  Please contact [facility] for night-coverage after hours (4p -7a ) and weekends on [email or web address]  Patient seen with NP, agree with the above note.    Not making much headways.  Co-ox remains adequate at 62% and he is on milrinone 0.25.  HR in 100s with stable BP on midodrine 10 mg bid. Still with CVP in upper 20s and some UOP but not enough to make  him net negative.  He remains comfortable. Creatinine continues to rise.   General: NAD Neck: JVP 16+ cm, no thyromegaly or thyroid nodule.  Lungs: Clear to auscultation bilaterally with normal respiratory effort. CV: Nondisplaced PMI.  Heart irregular S1/S2, no S3/S4, 2/6 SEM RUSB.  1+ edema to knees.   Abdomen: Soft, nontender, no hepatosplenomegaly, no distention.  Skin: Intact without lesions or rashes.  Neurologic: Alert and oriented x 3.  Psych: Normal affect. Extremities: No clubbing or cyanosis.  HEENT: Normal.   Low output HF with biventricular failure and cardiorenal syndrome.  Minimal diuresis so far with high dose Lasix.  - As above, continue Lasix and add metolazone 5 mg bid.  -  However, I think that he is going to end up requiring HD for volume management.  He is willing to try HD to see if he tolerates.   - He remains in atrial fibrillation/mild RVR with plan for TEE-guided DCCV tomorrow, will lower milrinone dose prior.  He will need to continue amiodarone gtt.  Will stop Eliquis and use heparin gtt for anticoagulation as he will likely need HD catheter in the next day or two.  - After TEE-guided DCCV, if not response to diuretics, will need HD catheter placement by IR and will need to try HD.   - Maintain MAP with midodrine as needed, BP currently stable.   Loralie Champagne 08/21/2019 4:19 PM

## 2019-08-21 NOTE — H&P (View-Only) (Signed)
Patient ID: Ricardo Payne, male   DOB: [birth year], 86 y.o.   MRN: [id number]     Advanced Heart Failure Rounding Note  PCP-Cardiologist: Donato Heinz, MD   Subjective:    Yesterday switched to lasix 160 mg twice a day  Creatinine trending up 2.0>2.6>3.1    Remains on milrinone 0.25 mcg + amiodarone 30 mg per hour    Had trouble breathing last night    Objective:   Weight Range: 101.4 kg Body mass index is 36.07 kg/m.   Vital Signs:   Temp:  [97.6 F (36.4 C)-98 F (36.7 C)] 98 F (36.7 C) (03/23 0434) Pulse Rate:  [89-106] 102 (03/23 0715) Resp:  [12-31] 18 (03/23 0715) BP: (75-128)/(49-96) 128/68 (03/23 0700) SpO2:  [92 %-100 %] 97 % (03/23 0715) Weight:  [101.4 kg] 101.4 kg (03/23 0034) Last BM Date: 08/19/19  Weight change: Filed Weights   08/19/19 0311 08/20/19 0004 08/21/19 0034  Weight: 100 kg 101.7 kg 101.4 kg    Intake/Output:   Intake/Output Summary (Last 24 hours) at 08/21/2019 0738 Last data filed at 08/21/2019 0430 Gross per 24 hour  Intake 1442.59 ml  Output 975 ml  Net 467.59 ml      Physical Exam   CVP 26-27 General:  No resp difficulty HEENT: normal Neck: supple. JVP to jaw . Carotids 2+ bilat; no bruits. No lymphadenopathy or thryomegaly appreciated. Cor: PMI nondisplaced. Irregular rate & rhythm. No rubs, gallops or murmurs. Lungs: clear Abdomen: soft, nontender, nondistended. No hepatosplenomegaly. No bruits or masses. Good bowel sounds. Extremities: no cyanosis, clubbing, rash,  R and LLE 1-2+edema  RUE PICC Neuro: alert & orientedx3, cranial nerves grossly intact. moves all 4 extremities w/o difficulty. Affect pleasant  Telemetry     A Fib 90-100s personally reviewed   Labs    CBC No results for input(s): WBC, NEUTROABS, HGB, HCT, MCV, PLT in the last 72 hours. Basic Metabolic Panel Recent Labs    08/20/19 0500 08/21/19 0546  NA 135 135  K 4.4 3.8  CL 96* 96*  CO2 27 26  GLUCOSE 154* 175*  BUN 64* 72*    CREATININE 2.60* 3.09*  CALCIUM 9.0 8.8*  MG 2.3 2.2   Liver Function Tests No results for input(s): AST, ALT, ALKPHOS, BILITOT, PROT, ALBUMIN in the last 72 hours. No results for input(s): LIPASE, AMYLASE in the last 72 hours. Cardiac Enzymes No results for input(s): CKTOTAL, CKMB, CKMBINDEX, TROPONINI in the last 72 hours.  BNP: BNP (last 3 results) Recent Labs    08/13/19 1437 08/16/19 1659 08/17/19 0714  BNP 1,224.9* 1,081.1* 1,056.0*    ProBNP (last 3 results) Recent Labs    05/21/19 1535  PROBNP 6,191*     D-Dimer No results for input(s): DDIMER in the last 72 hours. Hemoglobin A1C No results for input(s): HGBA1C in the last 72 hours. Fasting Lipid Panel No results for input(s): CHOL, HDL, LDLCALC, TRIG, CHOLHDL, LDLDIRECT in the last 72 hours. Thyroid Function Tests No results for input(s): TSH, T4TOTAL, T3FREE, THYROIDAB in the last 72 hours.  Invalid input(s): FREET3  Other results:   Imaging    No results found.   Medications:     Scheduled Medications: . allopurinol  50 mg Oral QODAY  . apixaban  2.5 mg Oral BID  . Chlorhexidine Gluconate Cloth  6 each Topical Daily  . folic acid-pyridoxine-cyancobalamin  1 tablet Oral Daily  . midodrine  10 mg Oral BID WC  . pramipexole  0.25 mg Oral  BID  . rosuvastatin  10 mg Oral q1800  . sodium chloride flush  10-40 mL Intracatheter Q12H  . sodium chloride flush  3 mL Intravenous Q12H  . tamsulosin  0.4 mg Oral Daily    Infusions: . sodium chloride Stopped (08/19/19 1323)  . amiodarone 30 mg/hr (08/20/19 1848)  . furosemide 160 mg (08/20/19 1751)  . milrinone 0.25 mcg/kg/min (08/20/19 1301)    PRN Medications: sodium chloride, acetaminophen, sodium chloride flush, sodium chloride flush   Assessment/Plan   1. Acute on chronic systolic CHF: Ischemic cardiomyopathy.  St Jude ICD.  Echo in 2/21 with EF <20%, mildly dilated RV with moderately decreased systolic function, mild AS, mild-moderate  MR, severe TR.  CHF is complicated by cardiorenal syndrome and recurrent atrial fibrillation with RVR.  Suspect low output HF.   CVP 26-27. Creatinine up to 31 . Asked him to limit fluid intake.  - Continue lasix 160 mg twice a day and will add metolazone 5 mg bid.   - Continue milrinone 0.25 mcg/kg/min. CO-OX 62%  - He is on midodrine 10 bid. 2. CAD: S/p remote CABG (1989).  He does not think he has had a coronary evaluation since that time.  No chest pain. HS-TnI not elevated.  - Continue Crestor.  - No ASA given Eliquis use.  3. Atrial fibrillation: Paroxysmal.  He was admitted with atrial fibrillation with RVR.  This may have triggered his CHF exacerbation (though based on device interrogation, worsening volume status does not seem to necessarily track with atrial fibrillation) or may be a consequence of the exacerbation.  Currently rate in 100s.   - Continue amiodarone gtt for rate control.  - Continue apixaban => transition to heparin to allow pause for HD catheter.  - TEE DC/CV set up for tomorrow at 1200 4. AKI on CKD stage 4: Creatinine stable trending 2.1>2.6 >3.1  Suspect cardiorenal syndrome.  Nephrology consulted- May need HD. He would like to pursue if needed  5. PAD: Patient reports history of peripheral intervention though I do not see this is in history.  6. CVA: In past. Likely related to atrial fibrillation.   Consult Palliative Care for goals of care.  Nephrology appreciated Set up DC-CV TEE for Wednesday.   Length of Stay: 5  Amy Clegg, NP  08/21/2019, 7:38 AM  Advanced Heart Failure Team Pager [phone] (M-F; 7a - 4p)  Please contact [facility] for night-coverage after hours (4p -7a ) and weekends on [email or web address]  Patient seen with NP, agree with the above note.    Not making much headways.  Co-ox remains adequate at 62% and he is on milrinone 0.25.  HR in 100s with stable BP on midodrine 10 mg bid. Still with CVP in upper 20s and some UOP but not enough to make  him net negative.  He remains comfortable. Creatinine continues to rise.   General: NAD Neck: JVP 16+ cm, no thyromegaly or thyroid nodule.  Lungs: Clear to auscultation bilaterally with normal respiratory effort. CV: Nondisplaced PMI.  Heart irregular S1/S2, no S3/S4, 2/6 SEM RUSB.  1+ edema to knees.   Abdomen: Soft, nontender, no hepatosplenomegaly, no distention.  Skin: Intact without lesions or rashes.  Neurologic: Alert and oriented x 3.  Psych: Normal affect. Extremities: No clubbing or cyanosis.  HEENT: Normal.   Low output HF with biventricular failure and cardiorenal syndrome.  Minimal diuresis so far with high dose Lasix.  - As above, continue Lasix and add metolazone 5 mg bid.  -  However, I think that he is going to end up requiring HD for volume management.  He is willing to try HD to see if he tolerates.   - He remains in atrial fibrillation/mild RVR with plan for TEE-guided DCCV tomorrow, will lower milrinone dose prior.  He will need to continue amiodarone gtt.  Will stop Eliquis and use heparin gtt for anticoagulation as he will likely need HD catheter in the next day or two.  - After TEE-guided DCCV, if not response to diuretics, will need HD catheter placement by IR and will need to try HD.   - Maintain MAP with midodrine as needed, BP currently stable.   Loralie Champagne 08/21/2019 4:19 PM

## 2019-08-21 NOTE — Progress Notes (Signed)
ANTICOAGULATION CONSULT NOTE - Initial Consult  Pharmacy Consult for Hold Eliquis > start IV heparin Indication: atrial fibrillation  No Known Allergies  Patient Measurements: Height: 5\' 6"  (167.6 cm) Weight: 223 lb 8 oz (101.4 kg) IBW/kg (Calculated) : 63.8 Heparin Dosing Weight: 85.7 kg  Vital Signs: Temp: 97.6 F (36.4 C) (03/23 0800) Temp Source: Oral (03/23 0800) BP: 128/68 (03/23 0700) Pulse Rate: 102 (03/23 0715)  Labs: Recent Labs    08/19/19 0500 08/20/19 0500 08/21/19 0546  CREATININE 2.09* 2.60* 3.09*    Estimated Creatinine Clearance: 19.1 mL/min (A) (by C-G formula based on SCr of 3.09 mg/dL (H)).   Medical History: Past Medical History:  Diagnosis Date  . Acquired thrombophilia ([place])   . Age-related cataract of both eyes   . ASHD (arteriosclerotic heart disease)   . Benign prostatic hyperplasia   . CAD (coronary artery disease)    a. s/p CABG x1 in 1990  . Cerebellar stroke ([place]) 07/02/2016  . Chronic combined systolic and diastolic CHF (congestive heart failure) ([place])   . Chronic gout without tophus   . CKD (chronic kidney disease), stage IV ([place])   . CVA (cerebral vascular accident) ([place]) 2018  . Essential hypertension   . ICD (implantable cardioverter-defibrillator) in place   . Ischemic cardiomyopathy   . Mixed hyperlipidemia   . PAF (paroxysmal atrial fibrillation) ([place])   . Restless legs   . Secondary pulmonary arterial hypertension ([place])   . Thrombocytopenia ([place])   . Vitamin D deficiency     Medications:  Infusions:  . sodium chloride Stopped (08/19/19 1323)  . amiodarone 30 mg/hr (08/20/19 1848)  . furosemide 160 mg (08/21/19 0804)  . milrinone 0.25 mcg/kg/min (08/20/19 1301)    Assessment: 86 yo male on chronic Eliquis for afib.  Pharmacy asked to start IV heparin while Eliquis on hold for HD catheter placement.  Last dose this AM ~ 815.    Will need to monitor based on aPTTs as heparin levels will be falsely elevated by  Eliquis.  Goal of Therapy:  Heparin level 0.3-0.7 units/ml aPTT 66-102 seconds Monitor platelets by anticoagulation protocol: Yes   Plan:  1. Start IV heparin at 8 pm tonight at rate of 1100 units/hr. 2. Check aPTT 8 hrs after gtt starts. 3. Daily heparin level, aPTT, and CBC.  Marguerite Olea, [facility] Clinical Pharmacist Phone [phone]  08/21/2019 2:22 PM

## 2019-08-21 NOTE — Plan of Care (Signed)
Per RN patient is not on milrinone and per signout from day nurse has not been infusing all day today. However, Dr. Claris Gladden documentation from today states that the patient should be on milrinone 0.25 mcg/kg/min due to low output heart failure with cardiorenal syndrome and the milrinone order is still active. The patients renal function has continued to decline and SvO2 has not improved. It seems this may have inadvertently been stopped as there is no documentation that this has or should have occurred.   Due to borderline hypotension, I have increased his midodrine to 10mg  TID (From BID) with first dose now and will restart milrinone.

## 2019-08-21 NOTE — Plan of Care (Signed)

## 2019-08-21 NOTE — Progress Notes (Signed)
  Will need HD cath 24-48 hours.   Stop eliquis and start heparin drip.   Shadara Lopez NP-C  2:33 PM

## 2019-08-21 NOTE — Progress Notes (Signed)
CARDIAC REHAB PHASE I   Went to offer to walk with pt, pt currently working with PT. Will f/u later as time allows.  Rufina Falco, RN BSN 08/21/2019 11:49 AM

## 2019-08-21 NOTE — Progress Notes (Signed)
Daily Progress Note   Patient Name: Ricardo Payne       Date: 08/21/2019 DOB: 1931/03/17  Age: 86 y.o. MRN#: [id number] Attending Physician: Geralynn Rile, MD Primary Care Physician: [name], FNP Admit Date: 08/16/2019  Reason for Consultation/Follow-up: Establishing goals of care  Subjective: Patient awake, alert, oriented and able to participate in discussion.   GOC:  Ricardo Payne at bedside. Introduced role of palliative medicine. Ricardo Payne familiar with palliative medicine from previous admission.   Discussed events leading up to admission and course of hospitalization including diagnoses, interventions, plan of care, and tenuous clinical condition. Patient is agreeable to cardioversion and trial of dialysis, with hopes that he will have clinical improvement. Discussed that dialysis may be challenging with underlying end-stage heart failure and soft blood pressures.   Discussed MOST form with patient and son. They completed MOST form previously but unfortunately this is not on file or in paper chart. MOST form and electronic [facility] MOST form completed with patient and son. Discussed medical recommendation for DNR with underlying age, co-morbidities, and poor chance of survival or meaningful survival following CPR attempt. Discussed code blue scenario. Patient does not want his ribs broken. He states "let me go." Discussed DNR allowing nature to take course, a natural death. Patient does not wish for life support machine either. Patient and son agree with DNR/DNI code status.   MOST form decisions include: DNR/DNI, limited additional interventions, ABX if indicated, trial of IVF if indicated, NO feeding tube. Durable DNR completed. Copies placed in chart and given to son.    Reiterated that code status does not change current plan of care for aggressive medical management for heart failure, cardioversion, or trial of dialysis as per his wishes.   Reassured of ongoing support from palliative team pending his clinical course. Answered all questions and concerns. PMT contact information given to son.    Length of Stay: 5  Current Medications: Scheduled Meds:  . allopurinol  50 mg Oral QODAY  . Chlorhexidine Gluconate Cloth  6 each Topical Daily  . folic acid-pyridoxine-cyancobalamin  1 tablet Oral Daily  . [START ON 08/22/2019] metolazone  5 mg Oral BID  . midodrine  10 mg Oral BID WC  . polyethylene glycol  17 g Oral Daily  . pramipexole  0.25 mg Oral BID  .  rosuvastatin  10 mg Oral q1800  . sodium chloride flush  10-40 mL Intracatheter Q12H  . sodium chloride flush  3 mL Intravenous Q12H  . tamsulosin  0.4 mg Oral Daily    Continuous Infusions: . sodium chloride Stopped (08/19/19 1323)  . amiodarone 30 mg/hr (08/20/19 1848)  . furosemide 160 mg (08/21/19 0804)  . heparin    . milrinone 0.25 mcg/kg/min (08/20/19 1301)    PRN Meds: sodium chloride, acetaminophen, sodium chloride flush, sodium chloride flush  Physical Exam Vitals and nursing note reviewed.  Constitutional:      General: He is awake.     Appearance: He is ill-appearing.  Cardiovascular:     Rate and Rhythm: Rhythm irregularly irregular.  Pulmonary:     Effort: No tachypnea, accessory muscle usage or respiratory distress.  Neurological:     Mental Status: He is alert and oriented to person, place, and time.            Vital Signs: BP 128/68   Pulse (!) 102   Temp 97.6 F (36.4 C) (Oral)   Resp 18   Ht 5\' 6"  (1.676 m)   Wt 101.4 kg   SpO2 97%   BMI 36.07 kg/m  SpO2: SpO2: 97 % O2 Device: O2 Device: Room Air O2 Flow Rate: O2 Flow Rate (L/min): 2 L/min  Intake/output summary:   Intake/Output Summary (Last 24 hours) at 08/21/2019 1535 Last data filed at 08/21/2019  0430 Gross per 24 hour  Intake 666.57 ml  Output 775 ml  Net -108.43 ml   LBM: Last BM Date: 08/19/19 Baseline Weight: Weight: 100.2 kg Most recent weight: Weight: 101.4 kg       Palliative Assessment/Data: PPS 50%    Flowsheet Rows     Most Recent Value  Intake Tab  Referral Department  Cardiology  Unit at Time of Referral  Cardiac/Telemetry Unit  Palliative Care Primary Diagnosis  Cardiac  Palliative Care Type  Return patient Palliative Care  Reason for referral  Clarify Goals of Care  Date first seen by Palliative Care  08/20/19  Clinical Assessment  Palliative Performance Scale Score  50%  Psychosocial & Spiritual Assessment  Palliative Care Outcomes  Patient/Family meeting held?  Yes  Who was at the meeting?  patient  Palliative Care Outcomes  Clarified goals of care, Provided psychosocial or spiritual support, ACP counseling assistance      Patient Active Problem List   Diagnosis Date Noted  . Acute decompensated heart failure ([place]) 08/16/2019  . Heart failure with reduced ejection fraction ([place]) 07/19/2019  . Congestive heart failure ([place]) 07/18/2019  . Palliative care by specialist   . Goals of care, counseling/discussion   . Cirrhosis ([place])   . CKD (chronic kidney disease) 06/25/2019  . Head trauma 06/25/2019  . Blood thinned due to long-term anticoagulant use 06/25/2019  . Anemia 06/25/2019  . Acute kidney injury superimposed on chronic kidney disease ([place]) 05/23/2019  . Chronic combined systolic and diastolic CHF (congestive heart failure) ([place]) 05/23/2019  . Esophageal hypertension 05/23/2019  . Thrombocytopenia ([place]) 05/23/2019  . Acute on chronic HFrEF (heart failure with reduced ejection fraction) ([place]) 05/22/2019  . ICD (implantable cardioverter-defibrillator) in place 04/18/2019    Palliative Care Assessment & Plan   Patient Profile: 86 y.o. male  with past medical history of chronic combined CHF, ischemic cardiomyopathy EF 20%, CAD s/p CABG,  ICD, paroxysmal atrial fibrillation on Eliquis, HTN, HLD, CKD stage IV, multiple admissions for CHF exacerbations admitted on 08/16/2019 with  worsening SOB and lower extremity edema. Heart failure team following. Patient with poor progression despite aggressive medical management with high dose lasix gtt, metolazone, milrinone, and amio gtt. Worsening creatinine. Nephrology consulted. Palliative medicine consultation for goals of care.   Assessment: Acute on chronic systolic CHF Ischemic cardiomyopathy EF <20% Atrial fibrillation with RVR CAD s/p CABG AKI on CKD stage 4, likely cardiorenal Hx CVA  Recommendations/Plan:  GOC discussion with patient and son.   MOST form completed. Patient's decisions include: DNR/DNI, otherwise FULL scope treatment, ABX if indicated, trial of IVF if indicated, NO feeding tube. Durable DNR completed. Copies placed in chart and given to son.   Continue FULL SCOPE treatment including aggressive medical management per heart failure team, cardioversion, and trial of hemodialysis.   Chaplain consult. Patient interested in completing AD packet. Requesting stepson (Ricardo Payne) to be documented HCPOA.  PMT will continue to follow inpatient.   Code Status: DNR/DNI   Code Status Orders  (From admission, onward)         Start     Ordered   08/16/19 2215  Full code  Continuous     08/16/19 2216        Code Status History    Date Active Date Inactive Code Status Order ID Comments User Context   07/18/2019 1744 07/20/2019 1654 Full Code [id number]  Asencion Noble, MD ED   07/06/2019 0830 07/12/2019 1801 Full Code [id number]  Ina Homes, MD ED   05/22/2019 1141 05/23/2019 1722 Full Code [id number]  Leanor Kail, [place] [facility]   Advance Care Planning Activity       Prognosis:   Poor long-term prognosis with end-stage heart failure EF <20%, cardiorenal syndrome  Discharge Planning:  To Be Determined  Care plan was discussed with patient, son, RN, updated  Amy Clegg and Dr. Johnney Ou via secure epic chat  Thank you for allowing the Palliative Medicine Team to assist in the care of this patient.   Time In: 1530 Time Out: 1645 Total Time 75 Prolonged Time Billed yes      Greater than 50%  of this time was spent counseling and coordinating care related to the above assessment and plan.  Ihor Dow, DNP, FNP-C Palliative Medicine Team  Phone: [phone] Fax: [phone]  Please contact Palliative Medicine Team phone at [phone] for questions and concerns.

## 2019-08-21 NOTE — Progress Notes (Signed)
[facility] Progress Note   Subjective:   No improvement in volume status.  Didn't sleep well.  Feels weak today.   Objective Vitals:   08/21/19 0705 08/21/19 0710 08/21/19 0715 08/21/19 0800  BP:      Pulse: 99 100 (!) 102   Resp: (!) 21 (!) 24 18   Temp:    97.6 F (36.4 C)  TempSrc:    Oral  SpO2: 99% 98% 97%   Weight:      Height:       Physical Exam General: sitting in bedside chair comfortably Heart: no rub Lungs: rales to mid lung fields Abdomen: soft. Mildly distended Extremities: 1+ edema Neuro:  No asterixis  Additional Objective Labs: Basic Metabolic Panel: Recent Labs  Lab 08/19/19 0500 08/20/19 0500 08/21/19 0546  NA 138 135 135  K 3.9 4.4 3.8  CL 101 96* 96*  CO2 24 27 26   GLUCOSE 144* 154* 175*  BUN 59* 64* 72*  CREATININE 2.09* 2.60* 3.09*  CALCIUM 8.8* 9.0 8.8*   Liver Function Tests: Recent Labs  Lab 08/16/19 1740  AST 40  ALT 37  ALKPHOS 188*  BILITOT 2.2*  PROT 6.9  ALBUMIN 3.7   No results for input(s): LIPASE, AMYLASE in the last 168 hours. CBC: Recent Labs  Lab 08/16/19 1659  WBC 5.6  HGB 11.2*  HCT 35.8*  MCV 98.4  PLT 90*   Blood Culture    Component Value Date/Time   SDES BLOOD LEFT ANTECUBITAL 07/07/2019 1502   SPECREQUEST  07/07/2019 1502    AEROBIC BOTTLE ONLY Blood Culture results may not be optimal due to an inadequate volume of blood received in culture bottles   CULT  07/07/2019 1502    NO GROWTH 5 DAYS Performed at [facility], [place] [address], [place], [place] [address]    REPTSTATUS 07/12/2019 FINAL 07/07/2019 1502    Cardiac Enzymes: No results for input(s): CKTOTAL, CKMB, CKMBINDEX, TROPONINI in the last 168 hours. CBG: No results for input(s): GLUCAP in the last 168 hours. Iron Studies: No results for input(s): IRON, TIBC, TRANSFERRIN, FERRITIN in the last 72 hours. @lablastinr3 @ Studies/Results: No results found. Medications: . sodium chloride Stopped (08/19/19 1323)   . amiodarone 30 mg/hr (08/20/19 1848)  . furosemide 160 mg (08/21/19 0804)  . milrinone 0.25 mcg/kg/min (08/20/19 1301)   . allopurinol  50 mg Oral QODAY  . apixaban  2.5 mg Oral BID  . Chlorhexidine Gluconate Cloth  6 each Topical Daily  . folic acid-pyridoxine-cyancobalamin  1 tablet Oral Daily  . metolazone  5 mg Oral Daily  . midodrine  10 mg Oral BID WC  . pramipexole  0.25 mg Oral BID  . rosuvastatin  10 mg Oral q1800  . sodium chloride flush  10-40 mL Intracatheter Q12H  . sodium chloride flush  3 mL Intravenous Q12H  . tamsulosin  0.4 mg Oral Daily      Assessment/Plan **AoCHF: CVP 28, not responding to milrinone, high dose diuretics.  To be cardioverted tomorrow as well.   I discussed a trial of RRT with him as he's failing max medication therapies.     I think a trial is ok but given advanced age and quite severe heart failure I would be conservative in what expectations should be.  We discussed both short and long term dialysis and he wishes to proceed should it become necessary.  Perhaps we will see improved renal perfusion with off loading volume as was the case  during 07/2019 admission with diuresis.    Ok to proceed with placement of dialysis catheter - defer timing to cardiology.  If they think cardioversion may significantly help ok to wait but if not, would place.    **CKD 4:  Baseline Cr 2 recently now 3.09 in setting of CHF.  RRT discussion per above. Bladder scan and check UA here ok.    **Anemia: mild, not in need of ESA.   **CAD h/o CABG, HL: per cardiology  **A fib: may be cardioverted Wed.   Palliative care following which is appropriate.    Will follow call with questions.   Jannifer Hick MD 08/21/2019, 11:53 AM  [facility] Pager: [phone]

## 2019-08-22 ENCOUNTER — Inpatient Hospital Stay (HOSPITAL_COMMUNITY): Payer: Medicare Other | Admitting: Anesthesiology

## 2019-08-22 ENCOUNTER — Ambulatory Visit: Payer: Medicare Other | Admitting: Cardiology

## 2019-08-22 ENCOUNTER — Encounter (HOSPITAL_COMMUNITY): Payer: Self-pay | Admitting: Cardiology

## 2019-08-22 ENCOUNTER — Inpatient Hospital Stay (HOSPITAL_COMMUNITY): Payer: Medicare Other

## 2019-08-22 ENCOUNTER — Encounter (HOSPITAL_COMMUNITY)
Admission: EM | Disposition: A | Discharge status: Home Health | Payer: Self-pay | Source: Home / Self Care | Attending: Cardiovascular Disease

## 2019-08-22 DIAGNOSIS — I361 Nonrheumatic tricuspid (valve) insufficiency: Secondary | ICD-10-CM

## 2019-08-22 DIAGNOSIS — I340 Nonrheumatic mitral (valve) insufficiency: Secondary | ICD-10-CM

## 2019-08-22 HISTORY — PX: CARDIOVERSION: SHX1299

## 2019-08-22 HISTORY — PX: TEE WITHOUT CARDIOVERSION: SHX5443

## 2019-08-22 LAB — CBC
HCT: 31.7 % — ABNORMAL LOW (ref 39.0–52.0)
Hemoglobin: 10.4 g/dL — ABNORMAL LOW (ref 13.0–17.0)
MCH: 31.2 pg (ref 26.0–34.0)
MCHC: 32.8 g/dL (ref 30.0–36.0)
MCV: 95.2 fL (ref 80.0–100.0)
Platelets: 75 10*3/uL — ABNORMAL LOW (ref 150–400)
RBC: 3.33 MIL/uL — ABNORMAL LOW (ref 4.22–5.81)
RDW: 17.2 % — ABNORMAL HIGH (ref 11.5–15.5)
WBC: 5.5 10*3/uL (ref 4.0–10.5)
nRBC: 0 % (ref 0.0–0.2)

## 2019-08-22 LAB — BASIC METABOLIC PANEL
Anion gap: 14 (ref 5–15)
BUN: 78 mg/dL — ABNORMAL HIGH (ref 8–23)
CO2: 25 mmol/L (ref 22–32)
Calcium: 8.7 mg/dL — ABNORMAL LOW (ref 8.9–10.3)
Chloride: 93 mmol/L — ABNORMAL LOW (ref 98–111)
Creatinine, Ser: 3.71 mg/dL — ABNORMAL HIGH (ref 0.61–1.24)
GFR calc Af Amer: 16 mL/min — ABNORMAL LOW (ref >60)
GFR calc non Af Amer: 14 mL/min — ABNORMAL LOW (ref >60)
Glucose, Bld: 128 mg/dL — ABNORMAL HIGH (ref 70–99)
Potassium: 3.9 mmol/L (ref 3.5–5.1)
Sodium: 132 mmol/L — ABNORMAL LOW (ref 135–145)

## 2019-08-22 LAB — COOXEMETRY PANEL
Carboxyhemoglobin: 1.5 % (ref 0.5–1.5)
Methemoglobin: 1.1 % (ref 0.0–1.5)
O2 Saturation: 63.7 %
Total hemoglobin: 13.1 g/dL (ref 12.0–16.0)

## 2019-08-22 LAB — APTT
aPTT: 155 seconds — ABNORMAL HIGH (ref 24–36)
aPTT: 111 seconds — ABNORMAL HIGH (ref 24–36)

## 2019-08-22 LAB — HEPARIN LEVEL (UNFRACTIONATED): Heparin Unfractionated: >2.2 IU/mL — ABNORMAL HIGH (ref 0.30–0.70)

## 2019-08-22 LAB — MAGNESIUM: Magnesium: 2.3 mg/dL (ref 1.7–2.4)

## 2019-08-22 SURGERY — ECHOCARDIOGRAM, TRANSESOPHAGEAL
Anesthesia: Monitor Anesthesia Care

## 2019-08-22 MED ORDER — MILRINONE LACTATE IN DEXTROSE 20-5 MG/100ML-% IV SOLN
0.1250 ug/kg/min | INTRAVENOUS | Status: DC
  Administered 2019-08-22: 0.125 ug/kg/min via INTRAVENOUS

## 2019-08-22 MED ORDER — SODIUM CHLORIDE 0.9 % IV SOLN: INTRAVENOUS | Status: DC

## 2019-08-22 MED ORDER — MILRINONE LACTATE IN DEXTROSE 20-5 MG/100ML-% IV SOLN: 0.1250 ug/kg/min | INTRAVENOUS | Status: DC

## 2019-08-22 MED ORDER — PROPOFOL 500 MG/50ML IV EMUL
INTRAVENOUS | Status: DC | PRN
  Administered 2019-08-22: 25 ug/kg/min via INTRAVENOUS

## 2019-08-22 MED ORDER — METOLAZONE 5 MG PO TABS
5.0000 mg | ORAL_TABLET | Freq: Two times a day (BID) | ORAL | Status: DC
  Administered 2019-08-22 – 2019-08-23 (×2): 5 mg via ORAL
  Filled 2019-08-22 (×2): qty 1

## 2019-08-22 MED ORDER — DEXMEDETOMIDINE HCL 200 MCG/2ML IV SOLN
INTRAVENOUS | Status: DC | PRN
  Administered 2019-08-22: 8 ug via INTRAVENOUS

## 2019-08-22 MED ORDER — PHENYLEPHRINE HCL-NACL 10-0.9 MG/250ML-% IV SOLN
INTRAVENOUS | Status: DC | PRN
  Administered 2019-08-22: 25 ug/min via INTRAVENOUS

## 2019-08-22 NOTE — Progress Notes (Signed)
  Echocardiogram Echocardiogram Transesophageal has been performed.  Ricardo Payne 08/22/2019, 1:47 PM

## 2019-08-22 NOTE — Consult Note (Signed)
Chief Complaint: Patient was seen in consultation today for renal failure  Referring Physician(s): Dr. Johnney Ou  Supervising Physician: Arne Cleveland  Patient Status: [facility] - In-pt  History of Present Illness: Ricardo Payne is a 86 y.o. male with history of ischemic cardiomyopathy s/p St. Jude ICD, cardiorenal syndrome, recurrent a fib with RVR who underwent cardioversion earlier today. He has a history of CKD stage 4 with persistent elevation in his SCr since admission- now 3.7 with evidence of volume overload.  He is in need of initiation of dialysis. IR consulted for tunneled dialysis catheter placement.   Patient assessed at bedside.  He is accompanied by his son.  They are hopeful for return of renal function, but understand that symptoms and lab work and indicative of renal failure with volume overload.  They are in agreement for trial of dialysis.   Past Medical History:  Diagnosis Date  . Acquired thrombophilia ([place])   . Age-related cataract of both eyes   . ASHD (arteriosclerotic heart disease)   . Benign prostatic hyperplasia   . CAD (coronary artery disease)    a. s/p CABG x1 in 1990  . Cerebellar stroke ([place]) 07/02/2016  . Chronic combined systolic and diastolic CHF (congestive heart failure) ([place])   . Chronic gout without tophus   . CKD (chronic kidney disease), stage IV ([place])   . CVA (cerebral vascular accident) ([place]) 2018  . Essential hypertension   . ICD (implantable cardioverter-defibrillator) in place   . Ischemic cardiomyopathy   . Mixed hyperlipidemia   . PAF (paroxysmal atrial fibrillation) ([place])   . Restless legs   . Secondary pulmonary arterial hypertension ([place])   . Thrombocytopenia ([place])   . Vitamin D deficiency     Past Surgical History:  Procedure Laterality Date  . CHOLECYSTECTOMY    . CORONARY ARTERY BYPASS GRAFT    . PACEMAKER IMPLANT  10/2018    Allergies: Patient has no known allergies.  Medications: Prior to Admission  medications   Medication Sig Start Date End Date Taking? Authorizing Provider  allopurinol (ZYLOPRIM) 100 MG tablet Take 0.5 tablets (50 mg total) by mouth every other day. 07/12/19  Yes [name], MD  apixaban (ELIQUIS) 2.5 MG TABS tablet Take 2.5 mg by mouth 2 (two) times daily.   Yes [provider]  ergocalciferol (VITAMIN D2) 1.25 MG (50000 UT) capsule Take 50,000 Units by mouth every Sunday.    Yes [provider]  Folic Acid-Vit E1-DEY C14 (FOLBEE) 2.5-25-1 MG TABS tablet Take 1 tablet by mouth daily.   Yes [provider]  midodrine (PROAMATINE) 10 MG tablet Take 1 tablet (10 mg total) by mouth 2 (two) times daily. 08/03/19  Yes Donato Heinz, MD  pramipexole (MIRAPEX) 0.25 MG tablet Take 0.25 mg by mouth 2 (two) times daily.   Yes [provider]  rosuvastatin (CRESTOR) 10 MG tablet Take 1 tablet (10 mg total) by mouth daily. 08/04/19 07/29/20 Yes Weaver, Scott T, PA-C  tamsulosin (FLOMAX) 0.4 MG CAPS capsule Take 1 capsule (0.4 mg total) by mouth daily. 07/21/19  Yes [name], MD  torsemide (DEMADEX) 20 MG tablet Take 2 tablets (40 mg total) by mouth 2 (two) times daily. 08/13/19  Yes Donato Heinz, MD     Family History  Problem Relation Age of Onset  . Hypertension Mother   . Suicidality Mother   . Valvular heart disease Father   . Heart disease Father   . Heart attack Brother  Social History   Socioeconomic History  . Marital status: Widowed    Spouse name: Not on file  . Number of children: 2  . Years of education: Not on file  . Highest education level: Not on file  Occupational History  . Not on file  Tobacco Use  . Smoking status: Former Smoker    Quit date: 1988    Years since quitting: 33.2  . Smokeless tobacco: Never Used  Substance and Sexual Activity  . Alcohol use: Yes    Comment: rare  . Drug use: Never  . Sexual activity: Not on file  Other Topics Concern  . Not on file  Social  History Narrative  . Not on file   Social Determinants of Health   Financial Resource Strain:   . Difficulty of Paying Living Expenses:   Food Insecurity:   . Worried About Charity fundraiser in the Last Year:   . Arboriculturist in the Last Year:   Transportation Needs:   . Film/video editor (Medical):   Marland Kitchen Lack of Transportation (Non-Medical):   Physical Activity:   . Days of Exercise per Week:   . Minutes of Exercise per Session:   Stress:   . Feeling of Stress :   Social Connections:   . Frequency of Communication with Friends and Family:   . Frequency of Social Gatherings with Friends and Family:   . Attends Religious Services:   . Active Member of Clubs or Organizations:   . Attends Archivist Meetings:   Marland Kitchen Marital Status:      Review of Systems: A 12 point ROS discussed and pertinent positives are indicated in the HPI above.  All other systems are negative.  Review of Systems  Constitutional: Positive for fatigue. Negative for fever.  Respiratory: Negative for cough and shortness of breath.   Cardiovascular: Negative for chest pain.  Gastrointestinal: Negative for abdominal pain, nausea and vomiting.  Genitourinary: Positive for decreased urine volume.  Musculoskeletal: Negative for back pain.  Psychiatric/Behavioral: Negative for behavioral problems and confusion.    Vital Signs: BP (!) 103/50   Pulse 80   Temp 97.7 F (36.5 C) (Oral)   Resp 17   Ht 5\' 6"  (1.676 m)   Wt 223 lb 15.8 oz (101.6 kg)   SpO2 96%   BMI 36.15 kg/m   Physical Exam Vitals and nursing note reviewed.  Constitutional:      Appearance: Normal appearance.  HENT:     Mouth/Throat:     Mouth: Mucous membranes are moist.     Pharynx: Oropharynx is clear.  Cardiovascular:     Rate and Rhythm: Normal rate and regular rhythm.  Pulmonary:     Effort: Pulmonary effort is normal. No respiratory distress.     Breath sounds: Normal breath sounds.  Abdominal:     General:  Abdomen is flat.     Palpations: Abdomen is soft.  Skin:    General: Skin is warm and dry.  Neurological:     General: No focal deficit present.     Mental Status: He is alert and oriented to person, place, and time. Mental status is at baseline.  Psychiatric:        Mood and Affect: Mood normal.        Behavior: Behavior normal.        Thought Content: Thought content normal.        Judgment: Judgment normal.      MD  Evaluation Airway: WNL Heart: Other (comments) Heart  comments: irregular Abdomen: WNL Chest/ Lungs: WNL ASA  Classification: 3 Mallampati/Airway Score: Three   Imaging: DG Chest 2 View  Result Date: 08/16/2019 CLINICAL DATA:  Dyspnea. Fluid retention. Congestive heart failure with bilateral lower extremity and scrotal edema. EXAM: CHEST - 2 VIEW COMPARISON:  July 18, 2019 FINDINGS: Stable mild cardiomegaly with central vascular congestion and mild thickening of peripheral interlobular septa. No obvious pleural effusion. Redemonstration of mediastinal and upper abdominal surgical clips, intact median sternotomy wires, and single left ventricular lead cardiac defibrillator, similarly positioned with left-sided battery pack. Aortic knob calcified atherosclerosis. Streaky consolidation in the bilateral infrahilar regions, likely atelectatic. No pneumonia or pneumothorax. Skeletal degenerative changes and bony demineralization. IMPRESSION: Stable mild cardiomegaly with mild pulmonary edema and chronic thoracic postoperative changes. No pneumonia or obvious pleural effusion. Aortic calcified atherosclerosis. Electronically Signed   By: Revonda Humphrey   On: 08/16/2019 18:00   DG CHEST PORT 1 VIEW  Result Date: 08/18/2019 CLINICAL DATA:  PICC line placement. EXAM: PORTABLE CHEST 1 VIEW COMPARISON:  August 16, 2019. FINDINGS: Stable cardiomegaly. Atherosclerosis of thoracic aorta is noted. Left-sided pacemaker is unchanged in position. Interval placement of right-sided PICC  line with distal tip in expected position of the SVC. No pneumothorax or pleural effusion is noted. Lungs are clear. Bony thorax is unremarkable. IMPRESSION: No acute cardiopulmonary abnormality seen. Interval placement of right-sided PICC line with distal tip in expected position of the SVC. Aortic Atherosclerosis (ICD10-I70.0). Electronically Signed   By: Marijo Conception M.D.   On: 08/18/2019 10:31   [place] EKG SITE RITE  Result Date: 08/17/2019 If Site Rite image not attached, placement could not be confirmed due to current cardiac rhythm.   Labs:  CBC: Recent Labs    07/20/19 0008 08/13/19 1437 08/16/19 1659 08/22/19 0438  WBC 7.5 5.4 5.6 5.5  HGB 12.5* 11.5* 11.2* 10.4*  HCT 38.3* 35.5* 35.8* 31.7*  PLT 103* 95* 90* 75*    COAGS: Recent Labs    07/07/19 1438 08/21/19 1944 08/22/19 0438  INR 2.7* 1.8*  --   APTT  --   --  155*    BMP: Recent Labs    08/19/19 0500 08/20/19 0500 08/21/19 0546 08/22/19 0438  NA 138 135 135 132*  K 3.9 4.4 3.8 3.9  CL 101 96* 96* 93*  CO2 24 27 26 25   GLUCOSE 144* 154* 175* 128*  BUN 59* 64* 72* 78*  CALCIUM 8.8* 9.0 8.8* 8.7*  CREATININE 2.09* 2.60* 3.09* 3.71*  GFRNONAA 28* 21* 17* 14*  GFRAA 32* 25* 20* 16*    LIVER FUNCTION TESTS: Recent Labs    07/09/19 0255 07/10/19 0157 07/19/19 0449 07/20/19 0008 08/13/19 1437 08/16/19 1740  BILITOT 3.7*  --  3.0*  --  1.9* 2.2*  AST 154*  --  136*  --  40 40  ALT 96*  --  74*  --  38 37  ALKPHOS 103  --  180*  --  221* 188*  PROT 5.3*  --  6.3*  --  6.7 6.9  ALBUMIN 2.8*   < > 3.4* 3.7 4.1 3.7   < > = values in this interval not displayed.    TUMOR MARKERS: No results for input(s): AFPTM, CEA, CA199, CHROMGRNA in the last 8760 hours.  Assessment and Plan: Renal failure, volume overload Patient with history cardiorenal syndrome, acute-on-chronic kidney failure in need of initiation of dialysis.  He is on Eliquis for a  fib.  This has been held today-last dose 3/23.  INR  1.8 Plan for tunneled dialysis catheter placement in IR as schedule allows.  NPO after MN.  Risks and benefits discussed with the patient including, but not limited to bleeding, infection, vascular injury, pneumothorax which may require chest tube placement, air embolism or even death  All of the patient's questions were answered, patient is agreeable to proceed. Consent signed and in chart.  Thank you for this interesting consult.  I greatly enjoyed meeting TYSHAN ENDERLE and look forward to participating in their care.  A copy of this report was sent to the requesting provider on this date.  Electronically Signed: Docia Barrier, PA 08/22/2019, 4:24 PM   I spent a total of 20 Minutes    in face to face in clinical consultation, greater than 50% of which was counseling/coordinating care for renal failure.

## 2019-08-22 NOTE — Anesthesia Procedure Notes (Signed)
Procedure Name: MAC Date/Time: 08/22/2019 1:15 PM Performed by: Kyung Rudd, CRNA Pre-anesthesia Checklist: Patient identified, Emergency Drugs available, Suction available and Patient being monitored Patient Re-evaluated:Patient Re-evaluated prior to induction Oxygen Delivery Method: Circle system utilized Preoxygenation: Pre-oxygenation with 100% oxygen Induction Type: IV induction Placement Confirmation: positive ETCO2 Dental Injury: Teeth and Oropharynx as per pre-operative assessment

## 2019-08-22 NOTE — Transfer of Care (Signed)
Immediate Anesthesia Transfer of Care Note  Patient: Ricardo Payne  Procedure(s) Performed: TRANSESOPHAGEAL ECHOCARDIOGRAM (TEE) (N/A ) CARDIOVERSION (N/A )  Patient Location: Endoscopy Unit  Anesthesia Type:MAC  Level of Consciousness: drowsy  Airway & Oxygen Therapy: Patient Spontanous Breathing and Patient connected to nasal cannula oxygen  Post-op Assessment: Report given to RN, Post -op Vital signs reviewed and stable and Patient moving all extremities X 4  Post vital signs: Reviewed and stable  Last Vitals:  Vitals Value Taken Time  BP 119/56 08/22/19 1350  Temp 36.2 C 08/22/19 1350  Pulse 78 08/22/19 1353  Resp 20 08/22/19 1353  SpO2 96 % 08/22/19 1353  Vitals shown include unvalidated device data.  Last Pain:  Vitals:   08/22/19 1350  TempSrc: Axillary  PainSc: 0-No pain         Complications: No apparent anesthesia complications

## 2019-08-22 NOTE — Anesthesia Preprocedure Evaluation (Signed)
Anesthesia Evaluation  Patient identified by MRN, date of birth, ID band Patient awake    Reviewed: Allergy & Precautions, NPO status , Patient's Chart, lab work & pertinent test results  History of Anesthesia Complications Negative for: history of anesthetic complications  Airway Mallampati: II  TM Distance: >3 FB Neck ROM: Full    Dental  (+) Edentulous Lower, Edentulous Upper   Pulmonary former smoker,    Pulmonary exam normal        Cardiovascular hypertension, Pt. on medications + CAD, + CABG (1990) and +CHF  Normal cardiovascular exam+ dysrhythmias (on Eliquis) Atrial Fibrillation + Cardiac Defibrillator   TTE 2/21: EF <82%, RV systolic function moderately reduced, moderately elevated PASP (46.8 mmHg), mild to moderate MR, severe TR, mild AS, mild dilatation of the aortic root measuring 41 mm, dilated IVC suggesting RA pressure 15 mmHg    Neuro/Psych CVA (2018), No Residual Symptoms negative psych ROS   GI/Hepatic negative GI ROS, Neg liver ROS,   Endo/Other  negative endocrine ROS  Renal/GU CRFRenal disease     Musculoskeletal negative musculoskeletal ROS (+)   Abdominal   Peds  Hematology  (+) anemia , Hgb 10.4, plt 75   Anesthesia Other Findings   Reproductive/Obstetrics negative OB ROS                             Anesthesia Physical Anesthesia Plan  ASA: IV  Anesthesia Plan: MAC   Post-op Pain Management:    Induction:   PONV Risk Score and Plan: 1 and Treatment may vary due to age or medical condition and Propofol infusion  Airway Management Planned: Natural Airway and Nasal Cannula  Additional Equipment: None  Intra-op Plan:   Post-operative Plan:   Informed Consent: I have reviewed the patients History and Physical, chart, labs and discussed the procedure including the risks, benefits and alternatives for the proposed anesthesia with the patient or authorized  representative who has indicated his/her understanding and acceptance.   Patient has DNR.  Discussed DNR with patient and Continue DNR.     Plan Discussed with: CRNA  Anesthesia Plan Comments:         Anesthesia Quick Evaluation

## 2019-08-22 NOTE — CV Procedure (Signed)
Procedure: TEE  Indication: Atrial fibrillation  Sedation: Per anesthesiology  Findings: Please see echo section for full report.  Mildly dilated LV with normal wall thickness.  Diffuse hypokinesis with EF < 20%, diffuse hypokinesis.  The RV was moderately dilated with moderately decreased systolic function.  ICD noted in RV.  Moderate right atrial enlargement.  Moderate left atrial enlargement, no LA appendage thrombus. Severe tricuspid regurgitation, peak RV-RA gradient 36 mmHg.  Moderately calcified aortic valve with trivial regurgitation.  There was no significant aortic stenosis, mean gradient 8 mmHg.  Moderate mitral regurgitation with restricted posterior mitral leaflet. No ASD or PFO by color doppler.  Normal caliber thoracic aorta with grade 3 plaque.   Impression: May proceed to DCCV.   Ricardo Payne 08/22/2019 1:43 PM

## 2019-08-22 NOTE — Progress Notes (Signed)
[facility] Progress Note   Subjective: s/p cardioversion today, remains in NSR thus far.  Feels ok.   Ambulating with PT   Objective Vitals:   08/22/19 1355 08/22/19 1405 08/22/19 1415 08/22/19 1441  BP: (!) 112/57 (!) 116/58 (!) 103/50   Pulse: 78 79 80   Resp: 16 16 17    Temp:    97.7 F (36.5 C)  TempSrc:    Oral  SpO2: 95% 96% 96%   Weight:      Height:       Physical Exam General: lying at 30 degrees in bed comfortably Heart: nsr on monitor Lungs: normal WOB on RA lying 30 degrees Extremities: 1+ edema Neuro:  No asterixis  Additional Objective Labs: Basic Metabolic Panel: Recent Labs  Lab 08/20/19 0500 08/21/19 0546 08/22/19 0438  NA 135 135 132*  K 4.4 3.8 3.9  CL 96* 96* 93*  CO2 27 26 25   GLUCOSE 154* 175* 128*  BUN 64* 72* 78*  CREATININE 2.60* 3.09* 3.71*  CALCIUM 9.0 8.8* 8.7*   Liver Function Tests: Recent Labs  Lab 08/16/19 1740  AST 40  ALT 37  ALKPHOS 188*  BILITOT 2.2*  PROT 6.9  ALBUMIN 3.7   No results for input(s): LIPASE, AMYLASE in the last 168 hours. CBC: Recent Labs  Lab 08/16/19 1659 08/22/19 0438  WBC 5.6 5.5  HGB 11.2* 10.4*  HCT 35.8* 31.7*  MCV 98.4 95.2  PLT 90* 75*   Blood Culture    Component Value Date/Time   SDES BLOOD LEFT ANTECUBITAL 07/07/2019 1502   SPECREQUEST  07/07/2019 1502    AEROBIC BOTTLE ONLY Blood Culture results may not be optimal due to an inadequate volume of blood received in culture bottles   CULT  07/07/2019 1502    NO GROWTH 5 DAYS Performed at [facility], [place] [address], [place], [place] [address]    REPTSTATUS 07/12/2019 FINAL 07/07/2019 1502    Cardiac Enzymes: No results for input(s): CKTOTAL, CKMB, CKMBINDEX, TROPONINI in the last 168 hours. CBG: No results for input(s): GLUCAP in the last 168 hours. Iron Studies: No results for input(s): IRON, TIBC, TRANSFERRIN, FERRITIN in the last 72 hours. @lablastinr3 @ Studies/Results: No results  found. Medications: . sodium chloride Stopped (08/19/19 1323)  . amiodarone 30 mg/hr (08/22/19 0800)  . furosemide 160 mg (08/22/19 0851)  . heparin 950 Units/hr (08/22/19 0720)  . milrinone 0.125 mcg/kg/min (08/22/19 1445)   . allopurinol  50 mg Oral QODAY  . Chlorhexidine Gluconate Cloth  6 each Topical Daily  . folic acid-pyridoxine-cyancobalamin  1 tablet Oral Daily  . metolazone  5 mg Oral BID  . midodrine  10 mg Oral TID WC  . polyethylene glycol  17 g Oral Daily  . pramipexole  0.25 mg Oral BID  . rosuvastatin  10 mg Oral q1800  . sodium chloride flush  10-40 mL Intracatheter Q12H  . sodium chloride flush  3 mL Intravenous Q12H  . tamsulosin  0.4 mg Oral Daily      Assessment/Plan **AoCHF: CVP 28, not responding to milrinone, high dose diuretics.  S/p cardioversion today.  If no significant diuresis noted will plan to initiate RRT tomorrow after HD catheter placed by IR.  Main goal for tomorrow is UF so will plan for DUF 2.5 hr tx with 2.5L off as tolerated.   Over the past few days we've discussed a trial of RRT with him as he's failing max medication therapies.     I  think a trial is ok as his function status is pretty good but given advanced age and quite severe heart failure I would be conservative in what expectations should be.  We discussed both short and long term dialysis and he wishes to proceed.  Perhaps we will see improved renal perfusion with off loading volume as was the case during 07/2019 admission with diuresis.    **CKD 4:  Baseline Cr 2 recently now 3.7 in setting of CHF.  RRT discussion per above. Bladder scan and check UA here ok.    **Anemia: mild, not in need of ESA.   **CAD h/o CABG, HL: per cardiology  **A fib: s/p cardioversion 3/24  Palliative care following which is appropriate.    Will follow call with questions.   Jannifer Hick MD 08/22/2019, 3:27 PM  [facility] Pager: [phone]

## 2019-08-22 NOTE — Progress Notes (Signed)
ANTICOAGULATION CONSULT NOTE - Follow Up Consult  Pharmacy Consult for Hold Eliquis > start IV heparin Indication: atrial fibrillation  No Known Allergies  Patient Measurements: Height: 5\' 6"  (167.6 cm) Weight: 223 lb 15.8 oz (101.6 kg) IBW/kg (Calculated) : 63.8 Heparin Dosing Weight: 85.7 kg  Vital Signs: Temp: 97.7 F (36.5 C) (03/24 1441) Temp Source: Oral (03/24 1441) BP: 103/50 (03/24 1415) Pulse Rate: 80 (03/24 1415)  Labs: Recent Labs    08/20/19 0500 08/21/19 0546 08/21/19 1944 08/22/19 0438 08/22/19 1623  HGB  --   --   --  10.4*  --   HCT  --   --   --  31.7*  --   PLT  --   --   --  75*  --   APTT  --   --   --  155* 111*  LABPROT  --   --  20.8*  --   --   INR  --   --  1.8*  --   --   HEPARINUNFRC  --   --   --  >2.20*  --   CREATININE 2.60* 3.09*  --  3.71*  --     Estimated Creatinine Clearance: 16 mL/min (A) (by C-G formula based on SCr of 3.71 mg/dL (H)).   Medical History: Past Medical History:  Diagnosis Date  . Acquired thrombophilia ([place])   . Age-related cataract of both eyes   . ASHD (arteriosclerotic heart disease)   . Benign prostatic hyperplasia   . CAD (coronary artery disease)    a. s/p CABG x1 in 1990  . Cerebellar stroke ([place]) 07/02/2016  . Chronic combined systolic and diastolic CHF (congestive heart failure) ([place])   . Chronic gout without tophus   . CKD (chronic kidney disease), stage IV ([place])   . CVA (cerebral vascular accident) ([place]) 2018  . Essential hypertension   . ICD (implantable cardioverter-defibrillator) in place   . Ischemic cardiomyopathy   . Mixed hyperlipidemia   . PAF (paroxysmal atrial fibrillation) ([place])   . Restless legs   . Secondary pulmonary arterial hypertension ([place])   . Thrombocytopenia ([place])   . Vitamin D deficiency     Assessment: 86 yo male on chronic apixaban for afib.  Pharmacy asked to start IV heparin while apixaban on hold for HD catheter placement; last dose ~8 AM on 08/21/19.  We will  need to monitor anticoagulation based on aPTTs at this point, as heparin levels will be falsely elevated by apixaban.  aPTT ~9 hrs after heparin infusion was decreased to 950 units/hr was 111 sec, which is above the goal range for this pt. H/H 10.4/31.7, platelets 75 (CBC trending down). Per RN, no issue with IV or bleeding observed.   Of note, pt is scheduled for tunneled dialysis catheter placement by IR on 08/23/19 (as IR schedule allows, time TBD).  Goal of Therapy:  Heparin level 0.3-0.7 units/ml aPTT 66-102 seconds Monitor platelets by anticoagulation protocol: Yes   Plan:  Reduce heparin infusion to 850 units/hr Check 8-hr aPTT Monitor daily aPTT, heparin level, CBC Monitor for signs/symptoms of bleeding  Gillermina Hu, PharmD, BCPS, [facility] Clinical Pharmacist  08/22/2019 5:32 PM

## 2019-08-22 NOTE — Interval H&P Note (Signed)
History and Physical Interval Note:  08/22/2019 1:20 PM  Ricardo Payne  has presented today for surgery, with the diagnosis of Afib.  The various methods of treatment have been discussed with the patient and family. After consideration of risks, benefits and other options for treatment, the patient has consented to  Procedure(s): TRANSESOPHAGEAL ECHOCARDIOGRAM (TEE) (N/A) CARDIOVERSION (N/A) as a surgical intervention.  The patient's history has been reviewed, patient examined, no change in status, stable for surgery.  I have reviewed the patient's chart and labs.  Questions were answered to the patient's satisfaction.     Javeion Cannedy [employer]

## 2019-08-22 NOTE — Anesthesia Postprocedure Evaluation (Signed)
Anesthesia Post Note  Patient: Ricardo Payne  Procedure(s) Performed: TRANSESOPHAGEAL ECHOCARDIOGRAM (TEE) (N/A ) CARDIOVERSION (N/A )     Patient location during evaluation: PACU Anesthesia Type: MAC Level of consciousness: awake and alert and oriented Pain management: pain level controlled Vital Signs Assessment: post-procedure vital signs reviewed and stable Respiratory status: spontaneous breathing, nonlabored ventilation and respiratory function stable Cardiovascular status: blood pressure returned to baseline Postop Assessment: no apparent nausea or vomiting Anesthetic complications: no    Last Vitals:  Vitals:   08/22/19 1350 08/22/19 1355  BP: (!) 119/56 (!) 112/57  Pulse: 80 78  Resp: 18 16  Temp: (!) 36.2 C   SpO2: 97% 95%    Last Pain:  Vitals:   08/22/19 1350  TempSrc: Axillary  PainSc: 0-No pain                 Brennan Bailey

## 2019-08-22 NOTE — Progress Notes (Signed)
Patient ID: Ricardo Payne, male   DOB: 1930/07/24, 86 y.o.   MRN: [id number]     Advanced Heart Failure Rounding Note  PCP-Cardiologist: Donato Heinz, MD   Subjective:    Poor diuresis again with Lasix 160 mg IV bid + metolazone 5 mg bid.   Creatinine trending up 2.0>2.6>3.1>3.7.  CVP still > 20.   Remains on milrinone 0.25 mcg + amiodarone 30 mg per hour   TEE-guided DCCV today back to NSR.   TEE: Mildly dilated LV with EF < 20%, moderately dilated and moderately dysfunctional RV, severe TR, moderate MR, aortic sclerosis without significant stenosis.   Objective:   Weight Range: 101.6 kg Body mass index is 36.15 kg/m.   Vital Signs:   Temp:  [97.1 F (36.2 C)-97.9 F (36.6 C)] 97.1 F (36.2 C) (03/24 1350) Pulse Rate:  [80-96] 80 (03/24 1350) Resp:  [17-22] 18 (03/24 1350) BP: (94-119)/(56-75) 119/56 (03/24 1350) SpO2:  [97 %-100 %] 97 % (03/24 1350) Weight:  [101.6 kg] 101.6 kg (03/24 1200) Last BM Date: 08/21/19  Weight change: Filed Weights   08/21/19 0034 08/22/19 0458 08/22/19 1200  Weight: 101.4 kg 101.6 kg 101.6 kg    Intake/Output:   Intake/Output Summary (Last 24 hours) at 08/22/2019 1354 Last data filed at 08/22/2019 1353 Gross per 24 hour  Intake 1944.85 ml  Output 1050 ml  Net 894.85 ml      Physical Exam   CVP >20 General: NAD Neck: JVP 16 cm, no thyromegaly or thyroid nodule.  Lungs: Decreased at bases.  CV: Lateral PMI.  Heart irregular => regular S1/S2, no S3/S4, no murmur.  2+ edema to knees.   Abdomen: Soft, nontender, no hepatosplenomegaly, no distention.  Skin: Intact without lesions or rashes.  Neurologic: Alert and oriented x 3.  Psych: Normal affect. Extremities: No clubbing or cyanosis.  HEENT: Normal.    Telemetry     A Fib 90-100s => NSR 80s with DCCV personally reviewed   Labs    CBC Recent Labs    08/22/19 0438  WBC 5.5  HGB 10.4*  HCT 31.7*  MCV 95.2  PLT 75*   Basic Metabolic Panel Recent  Labs    08/21/19 0546 08/22/19 0438  NA 135 132*  K 3.8 3.9  CL 96* 93*  CO2 26 25  GLUCOSE 175* 128*  BUN 72* 78*  CREATININE 3.09* 3.71*  CALCIUM 8.8* 8.7*  MG 2.2 2.3   Liver Function Tests No results for input(s): AST, ALT, ALKPHOS, BILITOT, PROT, ALBUMIN in the last 72 hours. No results for input(s): LIPASE, AMYLASE in the last 72 hours. Cardiac Enzymes No results for input(s): CKTOTAL, CKMB, CKMBINDEX, TROPONINI in the last 72 hours.  BNP: BNP (last 3 results) Recent Labs    08/13/19 1437 08/16/19 1659 08/17/19 0714  BNP 1,224.9* 1,081.1* 1,056.0*    ProBNP (last 3 results) Recent Labs    05/21/19 1535  PROBNP 6,191*     D-Dimer No results for input(s): DDIMER in the last 72 hours. Hemoglobin A1C No results for input(s): HGBA1C in the last 72 hours. Fasting Lipid Panel No results for input(s): CHOL, HDL, LDLCALC, TRIG, CHOLHDL, LDLDIRECT in the last 72 hours. Thyroid Function Tests No results for input(s): TSH, T4TOTAL, T3FREE, THYROIDAB in the last 72 hours.  Invalid input(s): FREET3  Other results:   Imaging    No results found.   Medications:     Scheduled Medications: . [MAR Hold] allopurinol  50 mg Oral QODAY  . [  MAR Hold] Chlorhexidine Gluconate Cloth  6 each Topical Daily  . [MAR Hold] folic acid-pyridoxine-cyancobalamin  1 tablet Oral Daily  . [MAR Hold] midodrine  10 mg Oral TID WC  . [MAR Hold] polyethylene glycol  17 g Oral Daily  . [MAR Hold] pramipexole  0.25 mg Oral BID  . [MAR Hold] rosuvastatin  10 mg Oral q1800  . [MAR Hold] sodium chloride flush  10-40 mL Intracatheter Q12H  . [MAR Hold] sodium chloride flush  3 mL Intravenous Q12H  . [MAR Hold] tamsulosin  0.4 mg Oral Daily    Infusions: . [MAR Hold] sodium chloride Stopped (08/19/19 1323)  . sodium chloride 20 mL/hr at 08/22/19 1012  . amiodarone 30 mg/hr (08/22/19 0800)  . [MAR Hold] furosemide 160 mg (08/22/19 0851)  . heparin 950 Units/hr (08/22/19 0720)     PRN Medications: [MAR Hold] sodium chloride, [MAR Hold] acetaminophen, [MAR Hold] sodium chloride flush, [MAR Hold] sodium chloride flush   Assessment/Plan   1. Acute on chronic systolic CHF: Ischemic cardiomyopathy.  St Jude ICD.  Echo in 2/21 with EF <20%, mildly dilated RV with moderately decreased systolic function, mild AS, mild-moderate MR, severe TR.  CHF is complicated by cardiorenal syndrome and recurrent atrial fibrillation with RVR.  Suspect low output HF.  He is now on milrinone 0.25 with creatinine up to 3.7.  Poor diuresis on high dose diuretics.  Now back in NSR after DCCV.  CVP remains >20, co-ox good at 64%.   - Continue lasix 160 mg twice a day and metolazone 5 mg bid for today.   - Now that he is back in NSR, will decreased milrinone to 0.125.   - He is on midodrine 10 tid. - Nephrology is following, think he is going to end up needing HD for volume removal.  Have discussed this with him.  2. CAD: S/p remote CABG (1989).  He does not think he has had a coronary evaluation since that time.  No chest pain. HS-TnI not elevated.  - Continue Crestor.  - No ASA given anticoagulation.  3. Atrial fibrillation: Paroxysmal.  He was admitted with atrial fibrillation with RVR.  This may have triggered his CHF exacerbation (though based on device interrogation, worsening volume status does not seem to necessarily track with atrial fibrillation) or may be a consequence of the exacerbation.  TEE-guided DCCV done today, he is in NSR now.   - Continue amiodarone gtt for rate control while on milrinone.  - Decrease milrinone to 0.125.  - Continue heparin gtt to allow pause for HD catheter.  4. AKI on CKD stage 4: Creatinine trending 2.1>2.6 >3.1>3.7  Suspect cardiorenal syndrome.  Markedly elevated CVP but not able to get fluid removed effectively with milrinone/diuretics.  - I suspect that he will need HD for volume management.  He is agreeable to trying this.  Will see how he does today  in NSR, but will tentatively plan to place HD catheter tomorrow.  5. PAD: Patient reports history of peripheral intervention though I do not see this is in history.  6. CVA: In past. Likely related to atrial fibrillation.   Length of Stay: 6  Loralie Champagne, MD  08/22/2019, 1:54 PM  Advanced Heart Failure Team Pager [phone] (M-F; 7a - 4p)  Please contact [facility] for night-coverage after hours (4p -7a ) and weekends on [email or web address]

## 2019-08-22 NOTE — Progress Notes (Signed)
Physical Therapy Treatment Patient Details Name: Ricardo Payne MRN: [id number] DOB: 10/12/1930 Today's Date: 08/22/2019    History of Present Illness Patient is a 86 y/o male who presents with LE swelling and SOB. CXR-pulmonary edema. Admitted with Acute Decompensated Systolic HF. PMH includes CKD, ischemic heart disease, HTN, PAF, HLD, ICD, CAD s/p CABG, two recent admission for ADHF and volume overload (06/2019, 07/2019).    PT Comments    Pt progressing steadily towards his physical therapy goals, as evidenced by improved activity tolerance and increased ambulation distance today. Able to participate in seated warm up exercises and ambulation 220 feet with a walker at a min guard assist level. HR stable, SpO2 93-94% on RA, BP 126/62 post mobility. D/c plan remains appropriate.    Follow Up Recommendations  Home health PT;Supervision for mobility/OOB     Equipment Recommendations  None recommended by PT    Recommendations for Other Services       Precautions / Restrictions Precautions Precautions: Fall Restrictions Weight Bearing Restrictions: No    Mobility  Bed Mobility Overal bed mobility: Needs Assistance Bed Mobility: Supine to Sit     Supine to sit: Min guard        Transfers Overall transfer level: Needs assistance Equipment used: Rolling walker (2 wheeled) Transfers: Sit to/from Stand Sit to Stand: Min guard            Ambulation/Gait Ambulation/Gait assistance: Min guard Gait Distance (Feet): 220 Feet Assistive device: Rolling walker (2 wheeled) Gait Pattern/deviations: Step-through pattern;Decreased stride length Gait velocity: decreased   General Gait Details: Steady pace, one mild lateral LOB, min guard for stability   Stairs             Wheelchair Mobility    Modified Rankin (Stroke Patients Only)       Balance Overall balance assessment: Needs assistance Sitting-balance support: Feet supported;No upper extremity  supported Sitting balance-Leahy Scale: Good     Standing balance support: Bilateral upper extremity supported;During functional activity Standing balance-Leahy Scale: Fair                              Cognition Arousal/Alertness: Awake/alert Behavior During Therapy: WFL for tasks assessed/performed Overall Cognitive Status: Within Functional Limits for tasks assessed                                        Exercises General Exercises - Lower Extremity Long Arc Quad: Both;10 reps;Seated Hip Flexion/Marching: Both;10 reps;Seated    General Comments        Pertinent Vitals/Pain Pain Assessment: No/denies pain    Home Living                      Prior Function            PT Goals (current goals can now be found in the care plan section) Acute Rehab PT Goals Patient Stated Goal: none stated; agreeable to therapy Potential to Achieve Goals: Good Progress towards PT goals: Progressing toward goals    Frequency    Min 3X/week      PT Plan Current plan remains appropriate    Co-evaluation              AM-PAC PT "6 Clicks" Mobility   Outcome Measure  Help needed turning from your back to your side while  in a flat bed without using bedrails?: None Help needed moving from lying on your back to sitting on the side of a flat bed without using bedrails?: A Little Help needed moving to and from a bed to a chair (including a wheelchair)?: A Little Help needed standing up from a chair using your arms (e.g., wheelchair or bedside chair)?: A Little Help needed to walk in hospital room?: A Little Help needed climbing 3-5 steps with a railing? : A Little 6 Click Score: 19    End of Session Equipment Utilized During Treatment: Gait belt Activity Tolerance: Patient tolerated treatment well Patient left: in chair;with call bell/phone within reach;with chair alarm set;with family/visitor present Nurse Communication: Mobility status PT  Visit Diagnosis: Difficulty in walking, not elsewhere classified (R26.2)     Time: 3968-8648 PT Time Calculation (min) (ACUTE ONLY): 22 min  Charges:  $Therapeutic Activity: 8-22 mins                       Ricardo Payne, PT, DPT [place] Pager [phone] Office [phone]    Ricardo Payne 08/22/2019, 4:53 PM

## 2019-08-22 NOTE — Progress Notes (Signed)
PT Cancellation Note  Patient Details Name: Ricardo Payne MRN: [id number] DOB: 1930/08/26   Cancelled Treatment:    Reason Eval/Treat Not Completed: Patient at procedure or test/unavailable (TEE).    Wyona Almas, PT, DPT Acute Rehabilitation Services Pager [phone] Office [phone]'    [name] 08/22/2019, 1:32 PM

## 2019-08-22 NOTE — Progress Notes (Signed)
[facility] for Hold Eliquis > start IV heparin Indication: atrial fibrillation  Assessment: 86 yo male on chronic Eliquis for afib.  Pharmacy asked to start IV heparin while Eliquis on hold for HD catheter placement.  Last dose this AM ~ 815.    Will need to monitor based on aPTTs as heparin levels will be falsely elevated by Eliquis. Initial aPTT 155 sec, HL >2.20.  Spoke with RN regarding blood draw.  I feel technique she used was good but could be falsely elavted  Goal of Therapy:  Heparin level 0.3-0.7 units/ml aPTT 66-102 seconds Monitor platelets by anticoagulation protocol: Yes   Plan:  1. Decrease heparin to 950 units/hr 2. Check aPTT 8 hrs  Thanks for allowing pharmacy to be a part of this patient's care.  Excell Seltzer, PharmD Clinical Pharmacist  08/22/2019 7:01 AM

## 2019-08-22 NOTE — Care Management Important Message (Signed)
Important Message  Patient Details  Name: KENZO OZMENT MRN: [id number] Date of Birth: 02/01/1931   Medicare Important Message Given:  Yes     Shelda Altes 08/22/2019, 10:10 AM

## 2019-08-23 ENCOUNTER — Inpatient Hospital Stay (HOSPITAL_COMMUNITY): Payer: Medicare Other

## 2019-08-23 DIAGNOSIS — N189 Chronic kidney disease, unspecified: Secondary | ICD-10-CM | POA: Diagnosis not present

## 2019-08-23 DIAGNOSIS — Z515 Encounter for palliative care: Secondary | ICD-10-CM | POA: Diagnosis not present

## 2019-08-23 HISTORY — PX: IR US GUIDE VASC ACCESS RIGHT: IMG2390

## 2019-08-23 HISTORY — PX: IR FLUORO GUIDE CV LINE RIGHT: IMG2283

## 2019-08-23 LAB — BASIC METABOLIC PANEL
Anion gap: 16 — ABNORMAL HIGH (ref 5–15)
BUN: 88 mg/dL — ABNORMAL HIGH (ref 8–23)
CO2: 24 mmol/L (ref 22–32)
Calcium: 8.7 mg/dL — ABNORMAL LOW (ref 8.9–10.3)
Chloride: 92 mmol/L — ABNORMAL LOW (ref 98–111)
Creatinine, Ser: 4.21 mg/dL — ABNORMAL HIGH (ref 0.61–1.24)
GFR calc Af Amer: 14 mL/min — ABNORMAL LOW (ref >60)
GFR calc non Af Amer: 12 mL/min — ABNORMAL LOW (ref >60)
Glucose, Bld: 127 mg/dL — ABNORMAL HIGH (ref 70–99)
Potassium: 4.2 mmol/L (ref 3.5–5.1)
Sodium: 132 mmol/L — ABNORMAL LOW (ref 135–145)

## 2019-08-23 LAB — CBC
HCT: 31.6 % — ABNORMAL LOW (ref 39.0–52.0)
Hemoglobin: 10.2 g/dL — ABNORMAL LOW (ref 13.0–17.0)
MCH: 30.7 pg (ref 26.0–34.0)
MCHC: 32.3 g/dL (ref 30.0–36.0)
MCV: 95.2 fL (ref 80.0–100.0)
Platelets: 80 10*3/uL — ABNORMAL LOW (ref 150–400)
RBC: 3.32 MIL/uL — ABNORMAL LOW (ref 4.22–5.81)
RDW: 16.9 % — ABNORMAL HIGH (ref 11.5–15.5)
WBC: 5.9 10*3/uL (ref 4.0–10.5)
nRBC: 0 % (ref 0.0–0.2)

## 2019-08-23 LAB — APTT: aPTT: 56 seconds — ABNORMAL HIGH (ref 24–36)

## 2019-08-23 LAB — COOXEMETRY PANEL
Carboxyhemoglobin: 1.5 % (ref 0.5–1.5)
Methemoglobin: 0.9 % (ref 0.0–1.5)
O2 Saturation: 59.3 %
Total hemoglobin: 14.7 g/dL (ref 12.0–16.0)

## 2019-08-23 LAB — MAGNESIUM: Magnesium: 2.3 mg/dL (ref 1.7–2.4)

## 2019-08-23 LAB — HEPARIN LEVEL (UNFRACTIONATED): Heparin Unfractionated: 1.72 IU/mL — ABNORMAL HIGH (ref 0.30–0.70)

## 2019-08-23 LAB — HEPATITIS B SURFACE ANTIGEN: Hepatitis B Surface Ag: NONREACTIVE

## 2019-08-23 MED ORDER — LIDOCAINE HCL (PF) 1 % IJ SOLN
INTRAMUSCULAR | Status: AC | PRN
  Administered 2019-08-23: 5 mL

## 2019-08-23 MED ORDER — CEFAZOLIN SODIUM-DEXTROSE 2-4 GM/100ML-% IV SOLN
INTRAVENOUS | Status: AC
  Filled 2019-08-23: qty 100

## 2019-08-23 MED ORDER — CHLORHEXIDINE GLUCONATE CLOTH 2 % EX PADS
6.0000 | MEDICATED_PAD | Freq: Every day | CUTANEOUS | Status: DC
  Administered 2019-08-23 – 2019-08-28 (×3): 6 via TOPICAL

## 2019-08-23 MED ORDER — HEPARIN SODIUM (PORCINE) 1000 UNIT/ML IJ SOLN
INTRAMUSCULAR | Status: AC
  Administered 2019-08-23: 3200 [IU]
  Filled 2019-08-23: qty 4

## 2019-08-23 MED ORDER — HEPARIN SODIUM (PORCINE) 1000 UNIT/ML IJ SOLN
INTRAMUSCULAR | Status: AC
  Administered 2019-08-23: 3.2 mL
  Filled 2019-08-23: qty 1

## 2019-08-23 MED ORDER — ACETAMINOPHEN 325 MG PO TABS
ORAL_TABLET | ORAL | Status: AC
  Administered 2019-08-23: 650 mg via ORAL
  Filled 2019-08-23: qty 2

## 2019-08-23 MED ORDER — FENTANYL CITRATE (PF) 100 MCG/2ML IJ SOLN
INTRAMUSCULAR | Status: AC
  Filled 2019-08-23: qty 2

## 2019-08-23 MED ORDER — CEFAZOLIN (ANCEF) 1 G IV SOLR
INTRAVENOUS | Status: AC | PRN
  Administered 2019-08-23: 1 g

## 2019-08-23 MED ORDER — FENTANYL CITRATE (PF) 100 MCG/2ML IJ SOLN
INTRAMUSCULAR | Status: AC | PRN
  Administered 2019-08-23: 25 ug via INTRAVENOUS

## 2019-08-23 MED ORDER — LIDOCAINE HCL 1 % IJ SOLN
INTRAMUSCULAR | Status: AC
  Filled 2019-08-23: qty 20

## 2019-08-23 MED ORDER — MIDAZOLAM HCL 2 MG/2ML IJ SOLN
INTRAMUSCULAR | Status: AC
  Filled 2019-08-23: qty 2

## 2019-08-23 MED ORDER — MIDAZOLAM HCL 2 MG/2ML IJ SOLN
INTRAMUSCULAR | Status: AC | PRN
  Administered 2019-08-23: 0.5 mg via INTRAVENOUS

## 2019-08-23 MED ORDER — DOCUSATE SODIUM 100 MG PO CAPS
200.0000 mg | ORAL_CAPSULE | Freq: Every day | ORAL | Status: DC
  Administered 2019-08-23 – 2019-09-11 (×20): 200 mg via ORAL
  Filled 2019-08-23 (×20): qty 2

## 2019-08-23 NOTE — Progress Notes (Signed)
Chaplain came to discuss Advanced Directive but patient was out of room.  Chaplain left paperwork on table.  A chaplain will follow-up.

## 2019-08-23 NOTE — Progress Notes (Signed)
ANTICOAGULATION CONSULT NOTE - Follow Up Consult  Pharmacy Consult for Hold Eliquis > start IV heparin Indication: atrial fibrillation  No Known Allergies  Patient Measurements: Height: 5\' 6"  (167.6 cm) Weight: 226 lb 3.2 oz (102.6 kg) IBW/kg (Calculated) : 63.8 Heparin Dosing Weight: 85.7 kg  Vital Signs: Temp: 97.8 F (36.6 C) (03/25 0345) Temp Source: Oral (03/25 0345) BP: 113/65 (03/25 0345) Pulse Rate: 82 (03/25 0345)  Labs: Recent Labs    08/21/19 0546 08/21/19 1944 08/22/19 0438 08/22/19 1623 08/23/19 0258  HGB  --   --  10.4*  --  10.2*  HCT  --   --  31.7*  --  31.6*  PLT  --   --  75*  --  80*  APTT  --   --  155* 111* 56*  LABPROT  --  20.8*  --   --   --   INR  --  1.8*  --   --   --   HEPARINUNFRC  --   --  >2.20*  --  1.72*  CREATININE 3.09*  --  3.71*  --  4.21*    Estimated Creatinine Clearance: 14.1 mL/min (A) (by C-G formula based on SCr of 4.21 mg/dL (H)).   Medical History: Past Medical History:  Diagnosis Date  . Acquired thrombophilia ([place])   . Age-related cataract of both eyes   . ASHD (arteriosclerotic heart disease)   . Benign prostatic hyperplasia   . CAD (coronary artery disease)    a. s/p CABG x1 in 1990  . Cerebellar stroke ([place]) 07/02/2016  . Chronic combined systolic and diastolic CHF (congestive heart failure) ([place])   . Chronic gout without tophus   . CKD (chronic kidney disease), stage IV ([place])   . CVA (cerebral vascular accident) ([place]) 2018  . Essential hypertension   . ICD (implantable cardioverter-defibrillator) in place   . Ischemic cardiomyopathy   . Mixed hyperlipidemia   . PAF (paroxysmal atrial fibrillation) ([place])   . Restless legs   . Secondary pulmonary arterial hypertension ([place])   . Thrombocytopenia ([place])   . Vitamin D deficiency     Assessment: 86 yo male on chronic apixaban for afib.  Pharmacy asked to start IV heparin while apixaban on hold for HD catheter placement; last dose ~8 AM on 08/21/19.  We  will need to monitor anticoagulation based on aPTTs at this point, as heparin levels will be falsely elevated by apixaban.  The patient's aPTT early this morning is SUBtherapeutic after a rate decrease yesterday afternoon (aPTT 56 << 111, goal of 66-102). Heparin level remains falsely elevated due to recent Apixaban (HL 1.72) Hgb/Hct stable, plts up to 80 << 75. The RN reports the heparin drip was off for ~1h due to loss of IV access from 2200-2245 which is likely causing the level to be slightly subtherapeutic, will increase slightly.   Of note, pt is scheduled for tunneled dialysis catheter placement by IR on 08/23/19 (as IR schedule allows, time TBD).  Goal of Therapy:  Heparin level 0.3-0.7 units/ml aPTT 66-102 seconds Monitor platelets by anticoagulation protocol: Yes   Plan:  - Increase Heparin to 900 units/hr (9 ml/hr) - Will continue to monitor for any signs/symptoms of bleeding and will follow up with aPTT level in 8 hours   Thank you for allowing pharmacy to be a part of this patient's care.  Alycia Rossetti, PharmD, BCPS Clinical Pharmacist 08/23/2019 4:21 AM   **Pharmacist phone directory can now be found on [email or web address] ([email or web address]).  Listed under [place].

## 2019-08-23 NOTE — Progress Notes (Addendum)
Patient ID: Ricardo Payne, male   DOB: [birth year], 86 y.o.   MRN: [id number]     Advanced Heart Failure Rounding Note  PCP-Cardiologist: Donato Heinz, MD   Subjective:    Yesterday milrinone was cut back to 0.125 mcg.   CVP 27 despite high dose diuretics.Creatinine continues to rise.   Denies SOB/chest pain.  TEE-guided DCCV today back to NSR.   TEE: Mildly dilated LV with EF < 20%, moderately dilated and moderately dysfunctional RV, severe TR, moderate MR, aortic sclerosis without significant stenosis.   Objective:   Weight Range: 102.6 kg Body mass index is 36.51 kg/m.   Vital Signs:   Temp:  [97.1 F (36.2 C)-97.9 F (36.6 C)] 97.8 F (36.6 C) (03/25 0345) Pulse Rate:  [78-94] 82 (03/25 0345) Resp:  [16-20] 20 (03/25 0345) BP: (95-127)/(49-70) 113/65 (03/25 0345) SpO2:  [95 %-100 %] 99 % (03/25 0345) Weight:  [101.6 kg-102.6 kg] 102.6 kg (03/25 0314) Last BM Date: 08/21/19  Weight change: Filed Weights   08/22/19 0458 08/22/19 1200 08/23/19 0314  Weight: 101.6 kg 101.6 kg 102.6 kg    Intake/Output:   Intake/Output Summary (Last 24 hours) at 08/23/2019 0717 Last data filed at 08/23/2019 0400 Gross per 24 hour  Intake 1068.87 ml  Output 275 ml  Net 793.87 ml      Physical Exam   CVP 27 General:  Well appearing. No resp difficulty HEENT: normal Neck: supple. JVP to jaw . Carotids 2+ bilat; no bruits. No lymphadenopathy or thryomegaly appreciated. Cor: PMI nondisplaced. Regular rate & rhythm. No rubs, gallops or murmurs. Lungs: clear Abdomen: soft, nontender, nondistended. No hepatosplenomegaly. No bruits or masses. Good bowel sounds. Extremities: no cyanosis, clubbing, rash, R and LLE 2+  Edema. RUE PICC  Neuro: alert & orientedx3, cranial nerves grossly intact. moves all 4 extremities w/o difficulty. Affect pleasant    Telemetry     NSR 80s with occasional PVCs   Labs    CBC Recent Labs    08/22/19 0438 08/23/19 0258  WBC 5.5  5.9  HGB 10.4* 10.2*  HCT 31.7* 31.6*  MCV 95.2 95.2  PLT 75* 80*   Basic Metabolic Panel Recent Labs    08/22/19 0438 08/23/19 0258  NA 132* 132*  K 3.9 4.2  CL 93* 92*  CO2 25 24  GLUCOSE 128* 127*  BUN 78* 88*  CREATININE 3.71* 4.21*  CALCIUM 8.7* 8.7*  MG 2.3 2.3   Liver Function Tests No results for input(s): AST, ALT, ALKPHOS, BILITOT, PROT, ALBUMIN in the last 72 hours. No results for input(s): LIPASE, AMYLASE in the last 72 hours. Cardiac Enzymes No results for input(s): CKTOTAL, CKMB, CKMBINDEX, TROPONINI in the last 72 hours.  BNP: BNP (last 3 results) Recent Labs    08/13/19 1437 08/16/19 1659 08/17/19 0714  BNP 1,224.9* 1,081.1* 1,056.0*    ProBNP (last 3 results) Recent Labs    05/21/19 1535  PROBNP 6,191*     D-Dimer No results for input(s): DDIMER in the last 72 hours. Hemoglobin A1C No results for input(s): HGBA1C in the last 72 hours. Fasting Lipid Panel No results for input(s): CHOL, HDL, LDLCALC, TRIG, CHOLHDL, LDLDIRECT in the last 72 hours. Thyroid Function Tests No results for input(s): TSH, T4TOTAL, T3FREE, THYROIDAB in the last 72 hours.  Invalid input(s): FREET3  Other results:   Imaging    No results found.   Medications:     Scheduled Medications: . allopurinol  50 mg Oral QODAY  . Chlorhexidine  Gluconate Cloth  6 each Topical Daily  . folic acid-pyridoxine-cyancobalamin  1 tablet Oral Daily  . metolazone  5 mg Oral BID  . midodrine  10 mg Oral TID WC  . polyethylene glycol  17 g Oral Daily  . pramipexole  0.25 mg Oral BID  . rosuvastatin  10 mg Oral q1800  . sodium chloride flush  10-40 mL Intracatheter Q12H  . sodium chloride flush  3 mL Intravenous Q12H  . tamsulosin  0.4 mg Oral Daily    Infusions: . sodium chloride Stopped (08/19/19 1323)  . amiodarone 30 mg/hr (08/22/19 2045)  . furosemide 160 mg (08/22/19 1759)  . heparin 900 Units/hr (08/23/19 0441)  . milrinone 0.125 mcg/kg/min (08/22/19 1445)      PRN Medications: sodium chloride, acetaminophen, sodium chloride flush, sodium chloride flush   Assessment/Plan   1. Acute on chronic systolic CHF: Ischemic cardiomyopathy.  St Jude ICD.  Echo in 2/21 with EF <20%, mildly dilated RV with moderately decreased systolic function, mild AS, mild-moderate MR, severe TR.  CHF is complicated by cardiorenal syndrome and recurrent atrial fibrillation with RVR.  Suspect low output HF.  He is now on milrinone 0.25 with creatinine up to 3.7.  Poor diuresis on high dose diuretics.  Maintaining NSR after DCCV.  - CVP 27 despite high dose diuretics.Continue lasix 160 mg twice a day and metolazone 5 mg bid for today.   - CO-OX 59%. Stop milrinone. - He is on midodrine 10 tid. - Nephrology is following, plan for HD catheter today.   2. CAD: S/p remote CABG (1989).  He does not think he has had a coronary evaluation since that time.  No chest pain. HS-TnI not elevated.  - Continue Crestor.  - No ASA given anticoagulation.  3. Atrial fibrillation: Paroxysmal.  He was admitted with atrial fibrillation with RVR.  This may have triggered his CHF exacerbation (though based on device interrogation, worsening volume status does not seem to necessarily track with atrial fibrillation) or may be a consequence of the exacerbation.  TEE-guided DCCV done 3/24, maintaining NSR.    - Continue amiodarone gtt today. Stop tomorrow. - Continue heparin gtt to allow pause for HD catheter.  4. AKI on CKD stage 4: Creatinine trending 2.1>2.6 >3.1>3.7 > 4.2  Suspect cardiorenal syndrome.  Markedly elevated CVP but not able to get fluid removed effectively with milrinone/diuretics.  - HD catheter today per IR.  -He is agreeable to trying this.  Will see how he does today in NSR, but will tentatively plan to place HD catheter tomorrow.  5. PAD: Patient reports history of peripheral intervention though I do not see this is in history.  6. CVA: In past. Likely related to atrial  fibrillation.   Length of Stay: 7  Amy Clegg, NP  08/23/2019, 7:17 AM  Advanced Heart Failure Team Pager [phone] (M-F; [place])  Please contact [facility] for night-coverage after hours (4p -7a ) and weekends on [email or web address]  Milrinone stopped today.  Minimal UOP with CVP still elevated at 27.  He remains in NSR on amiodarone gtt. HD catheter placed and he had 1st HD session successfully.  - Will plan ongoing HD for volume removal per nephrology .  - Continue midodrine but will stop milrinone completely.  Check co-ox in am.  - Continue amiodarone gtt today, probably to po tomorrow if he remains in NSR.  - Continue heparin gtt for now, when no more procedures and convert to Eliquis.   So  far, tolerating HD. I think that he is going to need this chronically for volume management.   Loralie Champagne 08/23/2019

## 2019-08-23 NOTE — Progress Notes (Signed)
Patient with minimal output on shift-approx 155ml.  Multiple bladder scans show 16ml

## 2019-08-23 NOTE — Progress Notes (Signed)
Daily Progress Note   Patient Name: Ricardo Payne       Date: 08/23/2019 DOB: 05/23/1931  Age: 86 y.o. MRN#: [id number] Attending Physician: Geralynn Rile, MD Primary Care Physician: [name], FNP Admit Date: 08/16/2019  Reason for Consultation/Follow-up: Establishing goals of care  Subjective/GOC: Patient awake, alert, oriented and able to participate in discussion. No complaints this morning. Cardioversion yesterday and remains in NSR. HD cath placed this AM with plans for hemodialysis this afternoon. Support given to patient. Answered questions. Reassured of ongoing support from PMT this admission. PMT contact information left at bedside. Also, patient's son has PMT contact information.   Length of Stay: 7  Current Medications: Scheduled Meds:  . allopurinol  50 mg Oral QODAY  . Chlorhexidine Gluconate Cloth  6 each Topical Daily  . Chlorhexidine Gluconate Cloth  6 each Topical Q0600  . docusate sodium  200 mg Oral Daily  . fentaNYL      . folic acid-pyridoxine-cyancobalamin  1 tablet Oral Daily  . lidocaine      . metolazone  5 mg Oral BID  . midazolam      . midodrine  10 mg Oral TID WC  . polyethylene glycol  17 g Oral Daily  . pramipexole  0.25 mg Oral BID  . rosuvastatin  10 mg Oral q1800  . sodium chloride flush  10-40 mL Intracatheter Q12H  . sodium chloride flush  3 mL Intravenous Q12H  . tamsulosin  0.4 mg Oral Daily    Continuous Infusions: . sodium chloride Stopped (08/19/19 1323)  . amiodarone 30 mg/hr (08/23/19 0803)  . ceFAZolin    . furosemide 160 mg (08/23/19 0805)  . heparin 900 Units/hr (08/23/19 0441)    PRN Meds: sodium chloride, acetaminophen, sodium chloride flush, sodium chloride flush  Physical Exam Vitals and nursing note  reviewed.  Constitutional:      General: He is awake.     Appearance: He is ill-appearing.  Cardiovascular:     Rate and Rhythm: Rhythm irregularly irregular.  Pulmonary:     Effort: No tachypnea, accessory muscle usage or respiratory distress.  Neurological:     Mental Status: He is alert and oriented to person, place, and time.            Vital Signs: BP 126/69  Pulse 80   Temp 97.8 F (36.6 C)   Resp (!) 23   Ht 5\' 6"  (1.676 m)   Wt 102.6 kg   SpO2 95%   BMI 36.51 kg/m  SpO2: SpO2: 95 % O2 Device: O2 Device: Nasal Cannula O2 Flow Rate: O2 Flow Rate (L/min): 4 L/min  Intake/output summary:   Intake/Output Summary (Last 24 hours) at 08/23/2019 0272 Last data filed at 08/23/2019 0400 Gross per 24 hour  Intake 1002.87 ml  Output 225 ml  Net 777.87 ml   LBM: Last BM Date: 08/22/19 Baseline Weight: Weight: 100.2 kg Most recent weight: Weight: 102.6 kg       Palliative Assessment/Data: PPS 50%    Flowsheet Rows     Most Recent Value  Intake Tab  Referral Department  Cardiology  Unit at Time of Referral  Cardiac/Telemetry Unit  Palliative Care Primary Diagnosis  Cardiac  Palliative Care Type  Return patient Palliative Care  Reason for referral  Clarify Goals of Care  Date first seen by Palliative Care  08/20/19  Clinical Assessment  Palliative Performance Scale Score  50%  Psychosocial & Spiritual Assessment  Palliative Care Outcomes  Patient/Family meeting held?  Yes  Who was at the meeting?  patient and son  Palliative Care Outcomes  Clarified goals of care, Provided end of life care assistance, Provided psychosocial or spiritual support, Changed CPR status, Completed durable DNR, ACP counseling assistance      Patient Active Problem List   Diagnosis Date Noted  . Acute decompensated heart failure ([place]) 08/16/2019  . Heart failure with reduced ejection fraction ([place]) 07/19/2019  . Congestive heart failure ([place]) 07/18/2019  . Palliative care by  specialist   . Goals of care, counseling/discussion   . Cirrhosis ([place])   . CKD (chronic kidney disease) 06/25/2019  . Head trauma 06/25/2019  . Blood thinned due to long-term anticoagulant use 06/25/2019  . Anemia 06/25/2019  . Acute kidney injury superimposed on chronic kidney disease ([place]) 05/23/2019  . Chronic combined systolic and diastolic CHF (congestive heart failure) ([place]) 05/23/2019  . Esophageal hypertension 05/23/2019  . Thrombocytopenia ([place]) 05/23/2019  . Acute on chronic HFrEF (heart failure with reduced ejection fraction) ([place]) 05/22/2019  . ICD (implantable cardioverter-defibrillator) in place 04/18/2019    Palliative Care Assessment & Plan   Patient Profile: 86 y.o. male  with past medical history of chronic combined CHF, ischemic cardiomyopathy EF 20%, CAD s/p CABG, ICD, paroxysmal atrial fibrillation on Eliquis, HTN, HLD, CKD stage IV, multiple admissions for CHF exacerbations admitted on 08/16/2019 with worsening SOB and lower extremity edema. Heart failure team following. Patient with poor progression despite aggressive medical management with high dose lasix gtt, metolazone, milrinone, and amio gtt. Worsening creatinine. Nephrology consulted. Palliative medicine consultation for goals of care.   Assessment: Acute on chronic systolic CHF Ischemic cardiomyopathy EF <20% Atrial fibrillation with RVR CAD s/p CABG AKI on CKD stage 4, likely cardiorenal Hx CVA  Recommendations/Plan:  GOC discussion with patient and son 08/21/19.  MOST form completed. Patient's decisions include: DNR/DNI, otherwise FULL scope treatment, ABX if indicated, trial of IVF if indicated, NO feeding tube. Durable DNR completed. Copies placed in chart and given to son.   Continue FULL SCOPE treatment including aggressive medical management per heart failure team, cardioversion, and trial of hemodialysis. HD cath placed and plans for first HD session today.  Chaplain consult. Patient  interested in completing AD packet. Requesting stepson (Dwayne) to be documented HCPOA.  PMT will continue to  follow inpatient.   Code Status: DNR/DNI   Code Status Orders  (From admission, onward)         Start     Ordered   08/16/19 2215  Full code  Continuous     08/16/19 2216        Code Status History    Date Active Date Inactive Code Status Order ID Comments User Context   07/18/2019 1744 07/20/2019 1654 Full Code [id number]  Asencion Noble, MD ED   07/06/2019 0830 07/12/2019 1801 Full Code [id number]  Ina Homes, MD ED   05/22/2019 1141 05/23/2019 1722 Full Code [id number]  Leanor Kail, [place]   Advance Care Planning Activity       Prognosis:   Poor long-term prognosis with end-stage heart failure EF <20%, cardiorenal syndrome  Discharge Planning:  To Be Determined  Care plan was discussed with patient, RN at bedside  Thank you for allowing the Palliative Medicine Team to assist in the care of this patient.   Time In: 1000 Time Out: 1015 Total Time 15 Prolonged Time Billed no      Greater than 50%  of this time was spent counseling and coordinating care related to the above assessment and plan.  Ihor Dow, DNP, FNP-C Palliative Medicine Team  Phone: [phone] Fax: [phone]  Please contact Palliative Medicine Team phone at [phone] for questions and concerns.

## 2019-08-23 NOTE — Progress Notes (Addendum)
[facility] Progress Note   Subjective: s/p cardioversion yesterday, remains in NSR thus far.  Feels ok.   Ambulating with PT.  UOP < 324mL yesterday.  CVP 27. Had TDC placed this AM.  No uremic symptoms. Eating full lunch.   Objective Vitals:   08/23/19 0910 08/23/19 0915 08/23/19 0920 08/23/19 1010  BP: (!) 109/59 117/63 126/69 (!) 114/56  Pulse: 77  80   Resp: 18  (!) 23   Temp:      TempSrc:    Oral  SpO2: 96%  95%   Weight:      Height:       Physical Exam General: lying at 30 degrees in bed comfortably Heart: nsr on monitor Lungs: rales to mid fields, normal WOB Extremities: 1+ edema Neuro:  No asterixis  Additional Objective Labs: Basic Metabolic Panel: Recent Labs  Lab 08/21/19 0546 08/22/19 0438 08/23/19 0258  NA 135 132* 132*  K 3.8 3.9 4.2  CL 96* 93* 92*  CO2 26 25 24   GLUCOSE 175* 128* 127*  BUN 72* 78* 88*  CREATININE 3.09* 3.71* 4.21*  CALCIUM 8.8* 8.7* 8.7*   Liver Function Tests: Recent Labs  Lab 08/16/19 1740  AST 40  ALT 37  ALKPHOS 188*  BILITOT 2.2*  PROT 6.9  ALBUMIN 3.7   No results for input(s): LIPASE, AMYLASE in the last 168 hours. CBC: Recent Labs  Lab 08/16/19 1659 08/22/19 0438 08/23/19 0258  WBC 5.6 5.5 5.9  HGB 11.2* 10.4* 10.2*  HCT 35.8* 31.7* 31.6*  MCV 98.4 95.2 95.2  PLT 90* 75* 80*   Blood Culture    Component Value Date/Time   SDES BLOOD LEFT ANTECUBITAL 07/07/2019 1502   SPECREQUEST  07/07/2019 1502    AEROBIC BOTTLE ONLY Blood Culture results may not be optimal due to an inadequate volume of blood received in culture bottles   CULT  07/07/2019 1502    NO GROWTH 5 DAYS Performed at [facility] [address], [place], [place] [address]    REPTSTATUS 07/12/2019 FINAL 07/07/2019 1502    Cardiac Enzymes: No results for input(s): CKTOTAL, CKMB, CKMBINDEX, TROPONINI in the last 168 hours. CBG: No results for input(s): GLUCAP in the last 168 hours. Iron Studies: No results for  input(s): IRON, TIBC, TRANSFERRIN, FERRITIN in the last 72 hours. @lablastinr3 @ Studies/Results: No results found. Medications: . sodium chloride Stopped (08/19/19 1323)  . amiodarone 30 mg/hr (08/23/19 0803)  . ceFAZolin    . furosemide 160 mg (08/23/19 0805)  . heparin 900 Units/hr (08/23/19 0441)   . allopurinol  50 mg Oral QODAY  . Chlorhexidine Gluconate Cloth  6 each Topical Daily  . Chlorhexidine Gluconate Cloth  6 each Topical Q0600  . docusate sodium  200 mg Oral Daily  . fentaNYL      . folic acid-pyridoxine-cyancobalamin  1 tablet Oral Daily  . metolazone  5 mg Oral BID  . midazolam      . midodrine  10 mg Oral TID WC  . polyethylene glycol  17 g Oral Daily  . pramipexole  0.25 mg Oral BID  . rosuvastatin  10 mg Oral q1800  . sodium chloride flush  10-40 mL Intracatheter Q12H  . sodium chloride flush  3 mL Intravenous Q12H  . tamsulosin  0.4 mg Oral Daily      Assessment/Plan **AoCHF: CVP 28, not responding to milrinone, high dose diuretics.  S/p cardioversion. Despite max therapies no significant diuresis noted and will plan to  initiate RRT today.  Appreciate IR catheter placement.  Main goal for tomorrow is UF so will plan for DUF 2 hr tx with 2.5L off as tolerated.  Next treatment tomorrow with DUF but may need some HD based on labs.   Over the past few days we've discussed a trial of RRT with him as he's failing max medication therapies.     I think a trial is ok as his functional status is pretty good (lives alone, drives) but given advanced age and quite severe heart failure I would be conservative in what expectations should be.  We discussed both short and long term dialysis and he wishes to proceed.  Perhaps we will see improved renal perfusion with off loading volume as was the case during 07/2019 admission with diuresis but I think unlikely.   **CKD 4:  Baseline Cr 2 recently now 4.2 in setting of CHF.  RRT discussion per above. Bladder scan and check UA here  ok.    **Anemia: mild, not in need of ESA.   **CAD h/o CABG, HL: per cardiology  **A fib: s/p cardioversion 3/24  Palliative care following which is appropriate.    Will follow call with questions.   Jannifer Hick MD 08/23/2019, 12:11 PM  [facility] Pager: [phone]

## 2019-08-23 NOTE — Progress Notes (Signed)
Called to the room patient HD cath site was bleeding, Heparin drip stopped, hold pressure, Radiology and Cardiology  MD notified. Bleeding has stopped pt is in dialysis now, will restart heparin drip after one hour and continue to monitor the patient per order.

## 2019-08-23 NOTE — Procedures (Signed)
  Procedure: R IJ tunneled HD catheter placement   EBL:   minimal Complications:  none immediate  See full dictation in [facility].  Dillard Cannon MD Main # [phone] Pager  [phone]

## 2019-08-24 LAB — APTT
aPTT: 71 seconds — ABNORMAL HIGH (ref 24–36)
aPTT: 73 seconds — ABNORMAL HIGH (ref 24–36)

## 2019-08-24 LAB — COOXEMETRY PANEL
Carboxyhemoglobin: 1.3 % (ref 0.5–1.5)
Methemoglobin: 0.9 % (ref 0.0–1.5)
O2 Saturation: 52.4 %
Total hemoglobin: 13.9 g/dL (ref 12.0–16.0)

## 2019-08-24 LAB — BASIC METABOLIC PANEL
Anion gap: 19 — ABNORMAL HIGH (ref 5–15)
BUN: 91 mg/dL — ABNORMAL HIGH (ref 8–23)
CO2: 23 mmol/L (ref 22–32)
Calcium: 9.1 mg/dL (ref 8.9–10.3)
Chloride: 91 mmol/L — ABNORMAL LOW (ref 98–111)
Creatinine, Ser: 4.97 mg/dL — ABNORMAL HIGH (ref 0.61–1.24)
GFR calc Af Amer: 11 mL/min — ABNORMAL LOW (ref >60)
GFR calc non Af Amer: 10 mL/min — ABNORMAL LOW (ref >60)
Glucose, Bld: 120 mg/dL — ABNORMAL HIGH (ref 70–99)
Potassium: 4.3 mmol/L (ref 3.5–5.1)
Sodium: 133 mmol/L — ABNORMAL LOW (ref 135–145)

## 2019-08-24 LAB — CBC
HCT: 32.7 % — ABNORMAL LOW (ref 39.0–52.0)
Hemoglobin: 10.6 g/dL — ABNORMAL LOW (ref 13.0–17.0)
MCH: 30.2 pg (ref 26.0–34.0)
MCHC: 32.4 g/dL (ref 30.0–36.0)
MCV: 93.2 fL (ref 80.0–100.0)
Platelets: 67 10*3/uL — ABNORMAL LOW (ref 150–400)
RBC: 3.51 MIL/uL — ABNORMAL LOW (ref 4.22–5.81)
RDW: 17 % — ABNORMAL HIGH (ref 11.5–15.5)
WBC: 5.6 10*3/uL (ref 4.0–10.5)
nRBC: 0 % (ref 0.0–0.2)

## 2019-08-24 LAB — MAGNESIUM: Magnesium: 2.4 mg/dL (ref 1.7–2.4)

## 2019-08-24 LAB — HEPARIN LEVEL (UNFRACTIONATED): Heparin Unfractionated: 1.14 IU/mL — ABNORMAL HIGH (ref 0.30–0.70)

## 2019-08-24 MED ORDER — HEPARIN SODIUM (PORCINE) 1000 UNIT/ML IJ SOLN
INTRAMUSCULAR | Status: AC
  Filled 2019-08-24: qty 4

## 2019-08-24 MED ORDER — MELATONIN 3 MG PO TABS
3.0000 mg | ORAL_TABLET | Freq: Every day | ORAL | Status: DC
  Administered 2019-08-24: 3 mg via ORAL
  Filled 2019-08-24: qty 1

## 2019-08-24 MED ORDER — ACETAMINOPHEN 325 MG PO TABS
ORAL_TABLET | ORAL | Status: AC
  Filled 2019-08-24: qty 2

## 2019-08-24 MED ORDER — AMIODARONE HCL 200 MG PO TABS
200.0000 mg | ORAL_TABLET | Freq: Two times a day (BID) | ORAL | Status: DC
  Administered 2019-08-24 – 2019-09-03 (×21): 200 mg via ORAL
  Filled 2019-08-24 (×18): qty 1

## 2019-08-24 NOTE — Progress Notes (Signed)
Patient ID: CALBERT HULSEBUS, male   DOB: [birth year], 86 y.o.   MRN: [id number] [facility] Progress Note   Assessment/ Plan:   1. Acute kidney Injury on chronic kidney disease stage IV: Hemodynamically mediated secondary to CHF exacerbation and unfortunately failed maximal medical therapy for which hemodialysis/extracorporeal volume unloading started yesterday.  I suspect that this will be long-term dialysis dependency based on underlying renal function persistent anuric state.  He is on midodrine and I will change today's orders to hemodialysis with ultrafiltration. 2.  Acute exacerbation of congestive heart failure: Did not respond to inotropic support and diuretics and no changes to treatment response following cardioversion.  Started on hemodialysis beginning yesterday. 3.  Anemia: Hemoglobin and hematocrit currently at goal: No overt losses.  Monitor for ESA. 4.  History of atrial fibrillation status post cardioversion: Currently on amiodarone and heparin.  Subjective:   Reports to be feeling fair, no significant change following dialysis yesterday.   Objective:   BP 116/68   Pulse 75   Temp 97.8 F (36.6 C)   Resp (!) 24   Ht 5\' 6"  (1.676 m)   Wt 102.1 kg   SpO2 99%   BMI 36.33 kg/m   Intake/Output Summary (Last 24 hours) at 08/24/2019 5038 Last data filed at 08/24/2019 0500 Gross per 24 hour  Intake 1023.51 ml  Output 1414 ml  Net -390.49 ml   Weight change: 3.2 kg  Physical Exam: Gen: Comfortably resting in bed with head propped up, nurse at bedside CVS: Pulse regular rhythm, normal rate, S1 and S2 normal Resp: Diminished breath sounds over bases, no rhonchi Abd: Soft, obese, nontender Ext: 1+ lower extremity edema  Imaging: IR Fluoro Guide CV Line Right  Result Date: 08/23/2019 CLINICAL DATA:  Renal failure. Needs durable venous access for hemodialysis. EXAM: TUNNELED HEMODIALYSIS CATHETER PLACEMENT WITH ULTRASOUND AND FLUOROSCOPIC GUIDANCE  TECHNIQUE: The procedure, risks, benefits, and alternatives were explained to the patient. Questions regarding the procedure were encouraged and answered. The patient understands and consents to the procedure. As antibiotic prophylaxis, cefazolin 2 g was ordered pre-procedure and administered intravenously within one hour of incision. Patency of the right IJ vein was confirmed with ultrasound with image documentation. An appropriate skin site was determined. Region was prepped using maximum barrier technique including cap and mask, sterile gown, sterile gloves, large sterile sheet, and Chlorhexidine as cutaneous antisepsis. The region was infiltrated locally with 1% lidocaine. Intravenous Fentanyl 55mcg and Versed 0.5mg  were administered as conscious sedation during continuous monitoring of the patient's level of consciousness and physiological / cardiorespiratory status by the radiology RN, with a total moderate sedation time of 13 minutes. Under real-time ultrasound guidance, the right IJ vein was accessed with a 21 gauge micropuncture needle; the needle tip within the vein was confirmed with ultrasound image documentation. Needle exchanged over the 018 guidewire for transitional dilator, which allowed advancement of a Benson wire into the IVC. Over this, an MPA catheter was advanced. A Palindrome 19 hemodialysis catheter was tunneled from the right anterior chest wall approach to the right IJ dermatotomy site. The MPA catheter was exchanged over an Amplatz wire for serial vascular dilators which allow placement of a peel-away sheath, through which the catheter was advanced under intermittent fluoroscopy, positioned with its tips in the proximal and midright atrium. Spot chest radiograph confirms good catheter position. No pneumothorax. Catheter was flushed and primed per protocol. Catheter secured externally with O Prolene sutures. The right IJ dermatotomy site was closed with Dermabond.  COMPLICATIONS:  COMPLICATIONS None immediate FLUOROSCOPY TIME:  0.2 minutes; 29 uGym2 DAP COMPARISON:  None IMPRESSION: 1. Technically successful placement of tunneled right IJ hemodialysis catheter with ultrasound and fluoroscopic guidance. Ready for routine use. ACCESS: Remains approachable for percutaneous intervention as needed. Electronically Signed   By: Lucrezia Europe M.D.   On: 08/23/2019 15:18   IR US Guide Vasc Access Right  Result Date: 08/23/2019 CLINICAL DATA:  Renal failure. Needs durable venous access for hemodialysis. EXAM: TUNNELED HEMODIALYSIS CATHETER PLACEMENT WITH ULTRASOUND AND FLUOROSCOPIC GUIDANCE TECHNIQUE: The procedure, risks, benefits, and alternatives were explained to the patient. Questions regarding the procedure were encouraged and answered. The patient understands and consents to the procedure. As antibiotic prophylaxis, cefazolin 2 g was ordered pre-procedure and administered intravenously within one hour of incision. Patency of the right IJ vein was confirmed with ultrasound with image documentation. An appropriate skin site was determined. Region was prepped using maximum barrier technique including cap and mask, sterile gown, sterile gloves, large sterile sheet, and Chlorhexidine as cutaneous antisepsis. The region was infiltrated locally with 1% lidocaine. Intravenous Fentanyl 60mcg and Versed 0.5mg  were administered as conscious sedation during continuous monitoring of the patient's level of consciousness and physiological / cardiorespiratory status by the radiology RN, with a total moderate sedation time of 13 minutes. Under real-time ultrasound guidance, the right IJ vein was accessed with a 21 gauge micropuncture needle; the needle tip within the vein was confirmed with ultrasound image documentation. Needle exchanged over the 018 guidewire for transitional dilator, which allowed advancement of a Benson wire into the IVC. Over this, an MPA catheter was advanced. A Palindrome 19 hemodialysis  catheter was tunneled from the right anterior chest wall approach to the right IJ dermatotomy site. The MPA catheter was exchanged over an Amplatz wire for serial vascular dilators which allow placement of a peel-away sheath, through which the catheter was advanced under intermittent fluoroscopy, positioned with its tips in the proximal and midright atrium. Spot chest radiograph confirms good catheter position. No pneumothorax. Catheter was flushed and primed per protocol. Catheter secured externally with O Prolene sutures. The right IJ dermatotomy site was closed with Dermabond. COMPLICATIONS: COMPLICATIONS None immediate FLUOROSCOPY TIME:  0.2 minutes; 29 uGym2 DAP COMPARISON:  None IMPRESSION: 1. Technically successful placement of tunneled right IJ hemodialysis catheter with ultrasound and fluoroscopic guidance. Ready for routine use. ACCESS: Remains approachable for percutaneous intervention as needed. Electronically Signed   By: Lucrezia Europe M.D.   On: 08/23/2019 15:18    Labs: BMET Recent Labs  Lab 08/18/19 1807 08/19/19 0500 08/20/19 0500 08/21/19 0546 08/22/19 0438 08/23/19 0258 08/24/19 0335  NA 137 138 135 135 132* 132* 133*  K 3.6 3.9 4.4 3.8 3.9 4.2 4.3  CL 98 101 96* 96* 93* 92* 91*  CO2 24 24 27 26 25 24 23   GLUCOSE 242* 144* 154* 175* 128* 127* 120*  BUN 60* 59* 64* 72* 78* 88* 91*  CREATININE 2.12* 2.09* 2.60* 3.09* 3.71* 4.21* 4.97*  CALCIUM 8.8* 8.8* 9.0 8.8* 8.7* 8.7* 9.1   CBC Recent Labs  Lab 08/22/19 0438 08/23/19 0258 08/24/19 0335  WBC 5.5 5.9 5.6  HGB 10.4* 10.2* 10.6*  HCT 31.7* 31.6* 32.7*  MCV 95.2 95.2 93.2  PLT 75* 80* 67*   Medications:    . allopurinol  50 mg Oral QODAY  . Chlorhexidine Gluconate Cloth  6 each Topical Daily  . Chlorhexidine Gluconate Cloth  6 each Topical Q0600  . docusate sodium  200 mg  Oral Daily  . folic acid-pyridoxine-cyancobalamin  1 tablet Oral Daily  . midodrine  10 mg Oral TID WC  . polyethylene glycol  17 g Oral  Daily  . pramipexole  0.25 mg Oral BID  . rosuvastatin  10 mg Oral q1800  . sodium chloride flush  10-40 mL Intracatheter Q12H  . sodium chloride flush  3 mL Intravenous Q12H  . tamsulosin  0.4 mg Oral Daily   Elmarie Shiley, MD 08/24/2019, 8:37 AM

## 2019-08-24 NOTE — Progress Notes (Signed)
ANTICOAGULATION CONSULT NOTE - Follow Up Consult  Pharmacy Consult for Hold Eliquis > Heparin  Indication: atrial fibrillation  No Known Allergies  Patient Measurements: Height: 5\' 6"  (167.6 cm) Weight: 223 lb 15.8 oz (101.6 kg) IBW/kg (Calculated) : 63.8 Heparin Dosing Weight: 85.7 kg  Vital Signs: Temp: 97.7 F (36.5 C) (03/26 0017) Temp Source: Oral (03/26 0017) BP: 112/61 (03/25 2204) Pulse Rate: 79 (03/25 2204)  Labs: Recent Labs    08/21/19 0546 08/21/19 1944 08/22/19 0438 08/22/19 0438 08/22/19 1623 08/23/19 0258 08/24/19 0015  HGB  --   --  10.4*  --   --  10.2*  --   HCT  --   --  31.7*  --   --  31.6*  --   PLT  --   --  75*  --   --  80*  --   APTT  --   --  155*   < > 111* 56* 71*  LABPROT  --  20.8*  --   --   --   --   --   INR  --  1.8*  --   --   --   --   --   HEPARINUNFRC  --   --  >2.20*  --   --  1.72*  --   CREATININE 3.09*  --  3.71*  --   --  4.21*  --    < > = values in this interval not displayed.    Estimated Creatinine Clearance: 14.1 mL/min (A) (by C-G formula based on SCr of 4.21 mg/dL (H)).   Medical History: Past Medical History:  Diagnosis Date  . Acquired thrombophilia ([place])   . Age-related cataract of both eyes   . ASHD (arteriosclerotic heart disease)   . Benign prostatic hyperplasia   . CAD (coronary artery disease)    a. s/p CABG x1 in 1990  . Cerebellar stroke ([place]) 07/02/2016  . Chronic combined systolic and diastolic CHF (congestive heart failure) ([place])   . Chronic gout without tophus   . CKD (chronic kidney disease), stage IV ([place])   . CVA (cerebral vascular accident) ([place]) 2018  . Essential hypertension   . ICD (implantable cardioverter-defibrillator) in place   . Ischemic cardiomyopathy   . Mixed hyperlipidemia   . PAF (paroxysmal atrial fibrillation) ([place])   . Restless legs   . Secondary pulmonary arterial hypertension ([place])   . Thrombocytopenia ([place])   . Vitamin D deficiency     Assessment: 86 yo male  on chronic apixaban for afib.  Pharmacy asked to start IV heparin while apixaban on hold for HD catheter placement; last dose ~8 AM on 08/21/19.  We will need to monitor anticoagulation based on aPTTs at this point, as heparin levels will be falsely elevated by apixaban.  The patient's aPTT early this morning is SUBtherapeutic after a rate decrease yesterday afternoon (aPTT 56 << 111, goal of 66-102). Heparin level remains falsely elevated due to recent Apixaban (HL 1.72) Hgb/Hct stable, plts up to 80 << 75. The RN reports the heparin drip was off for ~1h due to loss of IV access from 2200-2245 which is likely causing the level to be slightly subtherapeutic, will increase slightly.   Of note, pt is scheduled for tunneled dialysis catheter placement by IR on 08/23/19 (as IR schedule allows, time TBD).  3/26 AM update:  APTT therapeutic x 1   Goal of Therapy:  Heparin level 0.3-0.7 units/ml aPTT 66-102 seconds Monitor platelets by anticoagulation  protocol: Yes   Plan:  -Cont heparin at 900 units/hr -Confirmatory aPTT with AM labs  Narda Bonds, PharmD, [place] Pharmacist Phone: [phone]

## 2019-08-24 NOTE — Progress Notes (Signed)
Patient off of unit to hemodialysis.

## 2019-08-24 NOTE — Progress Notes (Signed)
ANTICOAGULATION CONSULT NOTE - Follow Up Consult  Pharmacy Consult for Hold Eliquis > Heparin  Indication: atrial fibrillation  No Known Allergies  Patient Measurements: Height: 5\' 6"  (167.6 cm) Weight: 225 lb 1.6 oz (102.1 kg) IBW/kg (Calculated) : 63.8 Heparin Dosing Weight: 85.7 kg  Vital Signs: Temp: 97.4 F (36.3 C) (03/26 1415) Temp Source: Oral (03/26 1415) BP: 130/64 (03/26 1415) Pulse Rate: 74 (03/26 1415)  Labs: Recent Labs     0000 08/21/19 1944 08/22/19 0438 08/22/19 1623 08/23/19 0258 08/24/19 0015 08/24/19 0335  HGB   < >  --  10.4*  --  10.2*  --  10.6*  HCT  --   --  31.7*  --  31.6*  --  32.7*  PLT  --   --  75*  --  80*  --  67*  APTT  --   --  155*   < > 56* 71* 73*  LABPROT  --  20.8*  --   --   --   --   --   INR  --  1.8*  --   --   --   --   --   HEPARINUNFRC  --   --  >2.20*  --  1.72*  --  1.14*  CREATININE  --   --  3.71*  --  4.21*  --  4.97*   < > = values in this interval not displayed.    Estimated Creatinine Clearance: 11.9 mL/min (A) (by C-G formula based on SCr of 4.97 mg/dL (H)).   Medical History: Past Medical History:  Diagnosis Date  . Acquired thrombophilia ([place])   . Age-related cataract of both eyes   . ASHD (arteriosclerotic heart disease)   . Benign prostatic hyperplasia   . CAD (coronary artery disease)    a. s/p CABG x1 in 1990  . Cerebellar stroke ([place]) 07/02/2016  . Chronic combined systolic and diastolic CHF (congestive heart failure) ([place])   . Chronic gout without tophus   . CKD (chronic kidney disease), stage IV ([place])   . CVA (cerebral vascular accident) ([place]) 2018  . Essential hypertension   . ICD (implantable cardioverter-defibrillator) in place   . Ischemic cardiomyopathy   . Mixed hyperlipidemia   . PAF (paroxysmal atrial fibrillation) ([place])   . Restless legs   . Secondary pulmonary arterial hypertension ([place])   . Thrombocytopenia ([place])   . Vitamin D deficiency     Assessment: 86 yo male on  chronic apixaban for afib.  Pharmacy asked to start IV heparin while apixaban on hold for HD catheter placement; last dose ~8 AM on 08/21/19.  We will need to monitor anticoagulation based on aPTTs at this point, as heparin levels will be falsely elevated by apixaban.  The patient's aPTT 73sec on heparin drip 900 uts/hr. Heparin level remains falsely elevated due to recent Apixaban (HL 1.14) Hgb/Hct stable, plts low 67 .     Goal of Therapy:  Heparin level 0.3-0.7 units/ml aPTT 66-102 seconds Monitor platelets by anticoagulation protocol: Yes   Plan:  -Cont heparin at 900 units/hr   [facility] Pharm.D. CPP, BCPS Clinical Pharmacist [phone] 08/24/2019 2:20 PM

## 2019-08-24 NOTE — Progress Notes (Signed)
Patient returned to unit after hemodialysis. 2.5L removed.

## 2019-08-24 NOTE — Progress Notes (Addendum)
Patient ID: Ricardo Payne, male   DOB: 1931-02-09, 86 y.o.   MRN: [id number]     Advanced Heart Failure Rounding Note  PCP-Cardiologist: Donato Heinz, MD   Subjective:    Yesterday milrinone was stopped and HD started. CO-OX 52%.   On amio drip. Maintaining NSR.   Denies SOB.    Objective:   Weight Range: 102.1 kg Body mass index is 36.33 kg/m.   Vital Signs:   Temp:  [97.4 F (36.3 C)-98.5 F (36.9 C)] 97.5 F (36.4 C) (03/26 0900) Pulse Rate:  [73-98] 73 (03/26 1109) Resp:  [15-24] 15 (03/26 1109) BP: (76-133)/(49-86) 110/86 (03/26 1109) SpO2:  [96 %-100 %] 99 % (03/26 1109) Weight:  [101.6 kg-104.8 kg] 102.1 kg (03/26 0149) Last BM Date: 08/23/19  Weight change: Filed Weights   08/23/19 1410 08/23/19 1617 08/24/19 0149  Weight: 104.8 kg 101.6 kg 102.1 kg    Intake/Output:   Intake/Output Summary (Last 24 hours) at 08/24/2019 1233 Last data filed at 08/24/2019 1213 Gross per 24 hour  Intake 1263.51 ml  Output 1414 ml  Net -150.49 ml      Physical Exam  CVP 27 General:  Well appearing. No resp difficulty HEENT: normal Neck: supple. JVP to jaw. Carotids 2+ bilat; no bruits. No lymphadenopathy or thryomegaly appreciated. Cor: PMI nondisplaced. Regular rate & rhythm. No rubs, gallops or murmurs. R tunneled HD catheter.  Lungs: clear Abdomen: soft, nontender, nondistended. No hepatosplenomegaly. No bruits or masses. Good bowel sounds. Extremities: no cyanosis, clubbing, rash, R and LLE 2+ edema Neuro: alert & orientedx3, cranial nerves grossly intact. moves all 4 extremities w/o difficulty. Affect pleasant   Telemetry   NSR 70s personally reviewed.   Labs    CBC Recent Labs    08/23/19 0258 08/24/19 0335  WBC 5.9 5.6  HGB 10.2* 10.6*  HCT 31.6* 32.7*  MCV 95.2 93.2  PLT 80* 67*   Basic Metabolic Panel Recent Labs    08/23/19 0258 08/24/19 0335  NA 132* 133*  K 4.2 4.3  CL 92* 91*  CO2 24 23  GLUCOSE 127* 120*  BUN 88*  91*  CREATININE 4.21* 4.97*  CALCIUM 8.7* 9.1  MG 2.3 2.4   Liver Function Tests No results for input(s): AST, ALT, ALKPHOS, BILITOT, PROT, ALBUMIN in the last 72 hours. No results for input(s): LIPASE, AMYLASE in the last 72 hours. Cardiac Enzymes No results for input(s): CKTOTAL, CKMB, CKMBINDEX, TROPONINI in the last 72 hours.  BNP: BNP (last 3 results) Recent Labs    08/13/19 1437 08/16/19 1659 08/17/19 0714  BNP 1,224.9* 1,081.1* 1,056.0*    ProBNP (last 3 results) Recent Labs    05/21/19 1535  PROBNP 6,191*     D-Dimer No results for input(s): DDIMER in the last 72 hours. Hemoglobin A1C No results for input(s): HGBA1C in the last 72 hours. Fasting Lipid Panel No results for input(s): CHOL, HDL, LDLCALC, TRIG, CHOLHDL, LDLDIRECT in the last 72 hours. Thyroid Function Tests No results for input(s): TSH, T4TOTAL, T3FREE, THYROIDAB in the last 72 hours.  Invalid input(s): FREET3  Other results:   Imaging    No results found.   Medications:     Scheduled Medications: . allopurinol  50 mg Oral QODAY  . Chlorhexidine Gluconate Cloth  6 each Topical Daily  . Chlorhexidine Gluconate Cloth  6 each Topical Q0600  . docusate sodium  200 mg Oral Daily  . folic acid-pyridoxine-cyancobalamin  1 tablet Oral Daily  . midodrine  10  mg Oral TID WC  . polyethylene glycol  17 g Oral Daily  . pramipexole  0.25 mg Oral BID  . rosuvastatin  10 mg Oral q1800  . sodium chloride flush  10-40 mL Intracatheter Q12H  . sodium chloride flush  3 mL Intravenous Q12H  . tamsulosin  0.4 mg Oral Daily    Infusions: . sodium chloride Stopped (08/19/19 1323)  . amiodarone 30 mg/hr (08/24/19 0851)  . heparin 900 Units/hr (08/24/19 0631)    PRN Medications: sodium chloride, acetaminophen, sodium chloride flush, sodium chloride flush   Assessment/Plan   1. Acute on chronic systolic CHF: Ischemic cardiomyopathy.  St Jude ICD.  Echo in 2/21 with EF <20%, mildly dilated RV  with moderately decreased systolic function, mild AS, mild-moderate MR, severe TR.  CHF is complicated by cardiorenal syndrome and recurrent atrial fibrillation with RVR.  Suspect low output HF.  He is now on milrinone 0.25 with creatinine up to 3.7.  Poor diuresis on high dose diuretics.  Maintaining NSR after DCCV.  - CVP 27. Started iHD 3/25. Plan for iHD today.  - CO-OX 52 % off milrinone.  - He is on midodrine 10 tid. - Nephrology is following, started on HD  2. CAD: S/p remote CABG (1989).  He does not think he has had a coronary evaluation since that time.  No chest pain.  HS-TnI not elevated.  - Continue Crestor.  - No ASA given anticoagulation.  3. Atrial fibrillation: Paroxysmal.  He was admitted with atrial fibrillation with RVR.  This may have triggered his CHF exacerbation (though based on device interrogation, worsening volume status does not seem to necessarily track with atrial fibrillation) or may be a consequence of the exacerbation.  TEE-guided DCCV done 3/24, maintaining NSR. - Stop amio drip. Start amio 200 mg twice a day.  - Continue heparin drip.  Can switch to eliquis 2.5 mg twice a day when we know he is not having additional procedures.  4. AKI on CKD stage 4:  - Started HD 3/25 and will go again today.  -Nephrology appreciated.  5. PAD: Patient reports history of peripheral intervention though I do not see this is in history.  6. CVA: In past. Likely related to atrial fibrillation.   Length of Stay: [place], NP  08/24/2019, 12:33 PM  Advanced Heart Failure Team Pager [phone] (M-F; 7a - 4p)  Please contact [facility] for night-coverage after hours (4p -7a ) and weekends on [email or web address]  Patient seen with NP, agree with the above note.    Patient remains in NSR today on amiodarone.  Continue heparin gtt until all procedures completed then transition to Eliquis.   Stable now off milrinone.  Continue midodrine.  He is now getting dialysis for volume  management.  I think he will need long-term.   Loralie Champagne 08/24/2019 1:50 PM

## 2019-08-24 NOTE — Progress Notes (Signed)
Physical Therapy Treatment Patient Details Name: Ricardo Payne MRN: [id number] DOB: June 12, 1930 Today's Date: 08/24/2019    History of Present Illness Patient is a 86 y/o male who presents with LE swelling and SOB. CXR-pulmonary edema. Admitted with Acute Decompensated Systolic HF. PMH includes CKD, ischemic heart disease, HTN, PAF, HLD, ICD, CAD s/p CABG, two recent admission for ADHF and volume overload (06/2019, 07/2019).    PT Comments    Patient seen for mobility progression. Pt able to ambulate 240 ft with RW and standing rest breaks due to DOE. VSS on 4L O2 via [place]. Pt will continue to benefit from further skilled PT services to maximize independence and safety with mobility.     Follow Up Recommendations  Home health PT;Supervision for mobility/OOB     Equipment Recommendations  None recommended by PT    Recommendations for Other Services       Precautions / Restrictions Precautions Precautions: Fall Precaution Comments: watch HR, A-fib Restrictions Weight Bearing Restrictions: No    Mobility  Bed Mobility Overal bed mobility: Needs Assistance Bed Mobility: Supine to Sit;Sit to Supine     Supine to sit: Supervision Sit to supine: Min assist   General bed mobility comments: assist to bring bilat LE into bed; use of rail   Transfers Overall transfer level: Needs assistance Equipment used: Rolling walker (2 wheeled) Transfers: Sit to/from Stand Sit to Stand: Min guard         General transfer comment: min guard for safety  Ambulation/Gait Ambulation/Gait assistance: Min guard Gait Distance (Feet): 240 Feet Assistive device: Rolling walker (2 wheeled) Gait Pattern/deviations: Step-through pattern;Decreased stride length;Wide base of support Gait velocity: decreased   General Gait Details: decreased cadence and guarded movements; standing rest breaks required due to DOE; VSS on 4L O2 via [place]   Stairs             Wheelchair Mobility     Modified Rankin (Stroke Patients Only)       Balance Overall balance assessment: Needs assistance Sitting-balance support: Feet supported;No upper extremity supported Sitting balance-Leahy Scale: Good     Standing balance support: Bilateral upper extremity supported;During functional activity Standing balance-Leahy Scale: Fair                              Cognition Arousal/Alertness: Awake/alert Behavior During Therapy: WFL for tasks assessed/performed Overall Cognitive Status: Within Functional Limits for tasks assessed                                        Exercises      General Comments        Pertinent Vitals/Pain Pain Assessment: No/denies pain    Home Living                      Prior Function            PT Goals (current goals can now be found in the care plan section) Progress towards PT goals: Progressing toward goals    Frequency    Min 3X/week      PT Plan Current plan remains appropriate    Co-evaluation              AM-PAC PT "6 Clicks" Mobility   Outcome Measure  Help needed turning from your back to your side while in  a flat bed without using bedrails?: None Help needed moving from lying on your back to sitting on the side of a flat bed without using bedrails?: A Little Help needed moving to and from a bed to a chair (including a wheelchair)?: A Little Help needed standing up from a chair using your arms (e.g., wheelchair or bedside chair)?: A Little Help needed to walk in hospital room?: A Little Help needed climbing 3-5 steps with a railing? : A Little 6 Click Score: 19    End of Session Equipment Utilized During Treatment: Gait belt Activity Tolerance: Patient tolerated treatment well Patient left: with call bell/phone within reach;in bed Nurse Communication: Mobility status PT Visit Diagnosis: Difficulty in walking, not elsewhere classified (R26.2)     Time: 2878-6767 PT Time  Calculation (min) (ACUTE ONLY): 42 min  Charges:  $Gait Training: 23-37 mins                     Earney Navy, PTA Acute Rehabilitation Services Pager: [phone] Office: [phone]     Darliss Cheney 08/24/2019, 1:11 PM

## 2019-08-25 ENCOUNTER — Inpatient Hospital Stay: Payer: Self-pay

## 2019-08-25 DIAGNOSIS — D696 Thrombocytopenia, unspecified: Secondary | ICD-10-CM

## 2019-08-25 DIAGNOSIS — N184 Chronic kidney disease, stage 4 (severe): Secondary | ICD-10-CM

## 2019-08-25 DIAGNOSIS — I480 Paroxysmal atrial fibrillation: Secondary | ICD-10-CM

## 2019-08-25 LAB — CBC
HCT: 31.8 % — ABNORMAL LOW (ref 39.0–52.0)
Hemoglobin: 10.6 g/dL — ABNORMAL LOW (ref 13.0–17.0)
MCH: 31.2 pg (ref 26.0–34.0)
MCHC: 33.3 g/dL (ref 30.0–36.0)
MCV: 93.5 fL (ref 80.0–100.0)
Platelets: 55 10*3/uL — ABNORMAL LOW (ref 150–400)
RBC: 3.4 MIL/uL — ABNORMAL LOW (ref 4.22–5.81)
RDW: 17 % — ABNORMAL HIGH (ref 11.5–15.5)
WBC: 5.3 10*3/uL (ref 4.0–10.5)
nRBC: 0 % (ref 0.0–0.2)

## 2019-08-25 LAB — BASIC METABOLIC PANEL
Anion gap: 17 — ABNORMAL HIGH (ref 5–15)
BUN: 100 mg/dL — ABNORMAL HIGH (ref 8–23)
CO2: 22 mmol/L (ref 22–32)
Calcium: 8.6 mg/dL — ABNORMAL LOW (ref 8.9–10.3)
Chloride: 92 mmol/L — ABNORMAL LOW (ref 98–111)
Creatinine, Ser: 6.25 mg/dL — ABNORMAL HIGH (ref 0.61–1.24)
GFR calc Af Amer: 9 mL/min — ABNORMAL LOW (ref >60)
GFR calc non Af Amer: 7 mL/min — ABNORMAL LOW (ref >60)
Glucose, Bld: 118 mg/dL — ABNORMAL HIGH (ref 70–99)
Potassium: 4.4 mmol/L (ref 3.5–5.1)
Sodium: 131 mmol/L — ABNORMAL LOW (ref 135–145)

## 2019-08-25 LAB — COOXEMETRY PANEL
Carboxyhemoglobin: 1.5 % (ref 0.5–1.5)
Methemoglobin: 1.2 % (ref 0.0–1.5)
O2 Saturation: 65.5 %
Total hemoglobin: 11.5 g/dL — ABNORMAL LOW (ref 12.0–16.0)

## 2019-08-25 LAB — APTT: aPTT: 76 seconds — ABNORMAL HIGH (ref 24–36)

## 2019-08-25 LAB — HEPARIN LEVEL (UNFRACTIONATED): Heparin Unfractionated: 0.79 IU/mL — ABNORMAL HIGH (ref 0.30–0.70)

## 2019-08-25 MED ORDER — RENA-VITE PO TABS
1.0000 | ORAL_TABLET | Freq: Every day | ORAL | Status: DC
  Administered 2019-08-25 – 2019-09-10 (×17): 1 via ORAL
  Filled 2019-08-25 (×18): qty 1

## 2019-08-25 NOTE — Progress Notes (Addendum)
Pt received morning medication.  Nurse spoke with HD at 1000 and pt was not in the list for HD today. No HD orders in place for today. Pt received CHG bath, and full linen was changed.  BMP was sent early this morning to lab by nurse and results in result review.

## 2019-08-25 NOTE — Progress Notes (Signed)
[place] collected and sent to lab.  Called lab to inform.

## 2019-08-25 NOTE — Progress Notes (Signed)
ANTICOAGULATION CONSULT NOTE - Follow Up Consult  Pharmacy Consult for Hold Eliquis > Heparin  Indication: atrial fibrillation  No Known Allergies  Patient Measurements: Height: 5\' 6"  (167.6 cm) Weight: 221 lb 8 oz (100.5 kg) IBW/kg (Calculated) : 63.8 Heparin Dosing Weight: 85.7 kg  Vital Signs: Temp: 98.2 F (36.8 C) (03/27 0809) Temp Source: Oral (03/27 0809) BP: 133/118 (03/27 0809) Pulse Rate: 73 (03/27 0809)  Labs: Recent Labs    08/23/19 0258 08/23/19 0258 08/24/19 0015 08/24/19 0335 08/25/19 0602  HGB 10.2*   < >  --  10.6* 10.6*  HCT 31.6*  --   --  32.7* 31.8*  PLT 80*  --   --  67* 55*  APTT 56*   < > 71* 73* 76*  HEPARINUNFRC 1.72*  --   --  1.14* 0.79*  CREATININE 4.21*  --   --  4.97*  --    < > = values in this interval not displayed.    Estimated Creatinine Clearance: 11.8 mL/min (A) (by C-G formula based on SCr of 4.97 mg/dL (H)).   Medical History: Past Medical History:  Diagnosis Date  . Acquired thrombophilia ([place])   . Age-related cataract of both eyes   . ASHD (arteriosclerotic heart disease)   . Benign prostatic hyperplasia   . CAD (coronary artery disease)    a. s/p CABG x1 in 1990  . Cerebellar stroke ([place]) 07/02/2016  . Chronic combined systolic and diastolic CHF (congestive heart failure) ([place])   . Chronic gout without tophus   . CKD (chronic kidney disease), stage IV ([place])   . CVA (cerebral vascular accident) ([place]) 2018  . Essential hypertension   . ICD (implantable cardioverter-defibrillator) in place   . Ischemic cardiomyopathy   . Mixed hyperlipidemia   . PAF (paroxysmal atrial fibrillation) ([place])   . Restless legs   . Secondary pulmonary arterial hypertension ([place])   . Thrombocytopenia ([place])   . Vitamin D deficiency     Assessment: 86 yo male on chronic apixaban for afib.  Pharmacy asked to start IV heparin while apixaban on hold for HD catheter placement; last dose ~8 AM on 08/21/19.  We will need to monitor  anticoagulation based on aPTTs at this point, as heparin levels will be falsely elevated by apixaban.   This morning's aPTT is therapeutic at 76 seconds on 900 units/hr. Heparin level remains falsely elevated due to recent apixaban at 0.79. Hgb/Hct stable at 10.6/31.8, plts continuing to decrease, now at 55 which seems to be a chronic problem as he had a nadir of 42 in February of this year.    Goal of Therapy:  Heparin level 0.3-0.7 units/ml aPTT 66-102 seconds Monitor platelets by anticoagulation protocol: Yes   Plan:  Continue heparin at 900 units/hr Daily heparin level and aPTT until levels correlate  Daily CBC and monitor platelets closely  Monitor for bleeding  Follow up to resume PTA apixaban    Thank you,   Eddie Candle, PharmD PGY-1 Pharmacy Resident   Please check amion for clinical pharmacist contact number

## 2019-08-25 NOTE — Progress Notes (Signed)
Spoke with Harrell Gave MD regarding orders for PICC removal and new PICC line placement on left.

## 2019-08-25 NOTE — Progress Notes (Signed)
Patient ID: Ricardo Payne, male   DOB: 07-25-1930, 86 y.o.   MRN: [id number] [facility] Progress Note   Assessment/ Plan:   1. Acute kidney Injury on chronic kidney disease stage IV: Hemodynamically mediated secondary to CHF exacerbation and unfortunately failed maximal medical therapy for which hemodialysis/extracorporeal volume unloading started yesterday.  Remains anuric and likely now on chronic hemodialysis-we will undertake a third dialysis treatment today.  Will order vein mapping and consult with vascular surgery for permanent access while he is on heparin drip. 2.  Acute exacerbation of congestive heart failure: Did not respond to inotropic support and diuretics and no changes to treatment response following cardioversion.  Started on hemodialysis 2 days ago and seemingly feeling better. 3.  Anemia: Hemoglobin and hematocrit currently at goal: No overt losses.  Monitor for ESA. 4.  History of atrial fibrillation status post cardioversion: Currently on amiodarone and heparin drip-hopefully we can have vascular access placed before conversion to oral anticoagulation.  Subjective:   Reports to be feeling better today with ultrafiltration/hemodialysis yesterday   Objective:   BP (!) 133/118   Pulse 73   Temp 98.2 F (36.8 C) (Oral)   Resp (!) 24   Ht 5\' 6"  (1.676 m)   Wt 100.5 kg   SpO2 96%   BMI 35.75 kg/m   Intake/Output Summary (Last 24 hours) at 08/25/2019 0849 Last data filed at 08/25/2019 0300 Gross per 24 hour  Intake 601.7 ml  Output 2500 ml  Net -1898.3 ml   Weight change: -1.6 kg  Physical Exam: Gen: Resting comfortably in bed CVS: Pulse regular rhythm, normal rate, S1 and S2 normal Resp: Diminished breath sounds over bases, no rhonchi.  Right IJ TDC Abd: Soft, obese, nontender Ext: 1+ lower extremity edema  Imaging: IR Fluoro Guide CV Line Right  Result Date: 08/23/2019 CLINICAL DATA:  Renal failure. Needs durable venous access for  hemodialysis. EXAM: TUNNELED HEMODIALYSIS CATHETER PLACEMENT WITH ULTRASOUND AND FLUOROSCOPIC GUIDANCE TECHNIQUE: The procedure, risks, benefits, and alternatives were explained to the patient. Questions regarding the procedure were encouraged and answered. The patient understands and consents to the procedure. As antibiotic prophylaxis, cefazolin 2 g was ordered pre-procedure and administered intravenously within one hour of incision. Patency of the right IJ vein was confirmed with ultrasound with image documentation. An appropriate skin site was determined. Region was prepped using maximum barrier technique including cap and mask, sterile gown, sterile gloves, large sterile sheet, and Chlorhexidine as cutaneous antisepsis. The region was infiltrated locally with 1% lidocaine. Intravenous Fentanyl 19mcg and Versed 0.5mg  were administered as conscious sedation during continuous monitoring of the patient's level of consciousness and physiological / cardiorespiratory status by the radiology RN, with a total moderate sedation time of 13 minutes. Under real-time ultrasound guidance, the right IJ vein was accessed with a 21 gauge micropuncture needle; the needle tip within the vein was confirmed with ultrasound image documentation. Needle exchanged over the 018 guidewire for transitional dilator, which allowed advancement of a Benson wire into the IVC. Over this, an MPA catheter was advanced. A Palindrome 19 hemodialysis catheter was tunneled from the right anterior chest wall approach to the right IJ dermatotomy site. The MPA catheter was exchanged over an Amplatz wire for serial vascular dilators which allow placement of a peel-away sheath, through which the catheter was advanced under intermittent fluoroscopy, positioned with its tips in the proximal and midright atrium. Spot chest radiograph confirms good catheter position. No pneumothorax. Catheter was flushed and primed per protocol.  Catheter secured externally  with O Prolene sutures. The right IJ dermatotomy site was closed with Dermabond. COMPLICATIONS: COMPLICATIONS None immediate FLUOROSCOPY TIME:  0.2 minutes; 29 uGym2 DAP COMPARISON:  None IMPRESSION: 1. Technically successful placement of tunneled right IJ hemodialysis catheter with ultrasound and fluoroscopic guidance. Ready for routine use. ACCESS: Remains approachable for percutaneous intervention as needed. Electronically Signed   By: Lucrezia Europe M.D.   On: 08/23/2019 15:18   IR US Guide Vasc Access Right  Result Date: 08/23/2019 CLINICAL DATA:  Renal failure. Needs durable venous access for hemodialysis. EXAM: TUNNELED HEMODIALYSIS CATHETER PLACEMENT WITH ULTRASOUND AND FLUOROSCOPIC GUIDANCE TECHNIQUE: The procedure, risks, benefits, and alternatives were explained to the patient. Questions regarding the procedure were encouraged and answered. The patient understands and consents to the procedure. As antibiotic prophylaxis, cefazolin 2 g was ordered pre-procedure and administered intravenously within one hour of incision. Patency of the right IJ vein was confirmed with ultrasound with image documentation. An appropriate skin site was determined. Region was prepped using maximum barrier technique including cap and mask, sterile gown, sterile gloves, large sterile sheet, and Chlorhexidine as cutaneous antisepsis. The region was infiltrated locally with 1% lidocaine. Intravenous Fentanyl 95mcg and Versed 0.5mg  were administered as conscious sedation during continuous monitoring of the patient's level of consciousness and physiological / cardiorespiratory status by the radiology RN, with a total moderate sedation time of 13 minutes. Under real-time ultrasound guidance, the right IJ vein was accessed with a 21 gauge micropuncture needle; the needle tip within the vein was confirmed with ultrasound image documentation. Needle exchanged over the 018 guidewire for transitional dilator, which allowed advancement of a  Benson wire into the IVC. Over this, an MPA catheter was advanced. A Palindrome 19 hemodialysis catheter was tunneled from the right anterior chest wall approach to the right IJ dermatotomy site. The MPA catheter was exchanged over an Amplatz wire for serial vascular dilators which allow placement of a peel-away sheath, through which the catheter was advanced under intermittent fluoroscopy, positioned with its tips in the proximal and midright atrium. Spot chest radiograph confirms good catheter position. No pneumothorax. Catheter was flushed and primed per protocol. Catheter secured externally with O Prolene sutures. The right IJ dermatotomy site was closed with Dermabond. COMPLICATIONS: COMPLICATIONS None immediate FLUOROSCOPY TIME:  0.2 minutes; 29 uGym2 DAP COMPARISON:  None IMPRESSION: 1. Technically successful placement of tunneled right IJ hemodialysis catheter with ultrasound and fluoroscopic guidance. Ready for routine use. ACCESS: Remains approachable for percutaneous intervention as needed. Electronically Signed   By: Lucrezia Europe M.D.   On: 08/23/2019 15:18    Labs: BMET Recent Labs  Lab 08/18/19 1807 08/19/19 0500 08/20/19 0500 08/21/19 0546 08/22/19 0438 08/23/19 0258 08/24/19 0335  NA 137 138 135 135 132* 132* 133*  K 3.6 3.9 4.4 3.8 3.9 4.2 4.3  CL 98 101 96* 96* 93* 92* 91*  CO2 24 24 27 26 25 24 23   GLUCOSE 242* 144* 154* 175* 128* 127* 120*  BUN 60* 59* 64* 72* 78* 88* 91*  CREATININE 2.12* 2.09* 2.60* 3.09* 3.71* 4.21* 4.97*  CALCIUM 8.8* 8.8* 9.0 8.8* 8.7* 8.7* 9.1   CBC Recent Labs  Lab 08/22/19 0438 08/23/19 0258 08/24/19 0335 08/25/19 0602  WBC 5.5 5.9 5.6 5.3  HGB 10.4* 10.2* 10.6* 10.6*  HCT 31.7* 31.6* 32.7* 31.8*  MCV 95.2 95.2 93.2 93.5  PLT 75* 80* 67* 55*   Medications:    . allopurinol  50 mg Oral QODAY  . amiodarone  200 mg Oral BID  . Chlorhexidine Gluconate Cloth  6 each Topical Daily  . Chlorhexidine Gluconate Cloth  6 each Topical Q0600  .  docusate sodium  200 mg Oral Daily  . folic acid-pyridoxine-cyancobalamin  1 tablet Oral Daily  . midodrine  10 mg Oral TID WC  . polyethylene glycol  17 g Oral Daily  . pramipexole  0.25 mg Oral BID  . rosuvastatin  10 mg Oral q1800  . sodium chloride flush  10-40 mL Intracatheter Q12H  . sodium chloride flush  3 mL Intravenous Q12H  . tamsulosin  0.4 mg Oral Daily   Elmarie Shiley, MD 08/25/2019, 8:49 AM

## 2019-08-25 NOTE — Progress Notes (Signed)
Patient ID: Ricardo Payne, male   DOB: [birth year], 86 y.o.   MRN: [id number]     Advanced Heart Failure Rounding Note  PCP-Cardiologist: Donato Heinz, MD   Subjective:    Reports dyspnea and edema have significantly improved since starting HD.  He is off milrinone.  Coox 65%.  2.5 L removed in HD yesterday.  Labs pending this morning.  Remains in normal sinus rhythm, off amiodarone drip now on p.o. amiodarone.   Objective:   Weight Range: 100.5 kg Body mass index is 35.75 kg/m.   Vital Signs:   Temp:  [97.4 F (36.3 C)-98.2 F (36.8 C)] 98.2 F (36.8 C) (03/27 0809) Pulse Rate:  [65-75] 73 (03/27 0809) Resp:  [15-24] 24 (03/27 0809) BP: (98-134)/(50-118) 133/118 (03/27 0809) SpO2:  [96 %-100 %] 96 % (03/27 0809) Weight:  [100.5 kg-103.2 kg] 100.5 kg (03/27 0300) Last BM Date: 08/23/19  Weight change: Filed Weights   08/24/19 1645 08/24/19 1930 08/25/19 0300  Weight: 103.2 kg 100.7 kg 100.5 kg    Intake/Output:   Intake/Output Summary (Last 24 hours) at 08/25/2019 0854 Last data filed at 08/25/2019 0300 Gross per 24 hour  Intake 601.7 ml  Output 2500 ml  Net -1898.3 ml      Physical Exam   General:  Well appearing. No resp difficulty HEENT: normal Neck: supple.+ JVP Cor: Regular rate & rhythm. No rubs, gallops or murmurs. R tunneled HD catheter.  Lungs: clear Abdomen: soft, nontender, nondistended. No hepatosplenomegaly. No bruits or masses. Good bowel sounds. Extremities: no cyanosis, clubbing, rash, R and LLE trace edema Neuro: alert & orientedx3, cranial nerves grossly intact. moves all 4 extremities w/o difficulty. Affect pleasant   Telemetry   NSR 70s personally reviewed.   Labs    CBC Recent Labs    08/24/19 0335 08/25/19 0602  WBC 5.6 5.3  HGB 10.6* 10.6*  HCT 32.7* 31.8*  MCV 93.2 93.5  PLT 67* 55*   Basic Metabolic Panel Recent Labs    08/23/19 0258 08/24/19 0335  NA 132* 133*  K 4.2 4.3  CL 92* 91*  CO2 24 23    GLUCOSE 127* 120*  BUN 88* 91*  CREATININE 4.21* 4.97*  CALCIUM 8.7* 9.1  MG 2.3 2.4   Liver Function Tests No results for input(s): AST, ALT, ALKPHOS, BILITOT, PROT, ALBUMIN in the last 72 hours. No results for input(s): LIPASE, AMYLASE in the last 72 hours. Cardiac Enzymes No results for input(s): CKTOTAL, CKMB, CKMBINDEX, TROPONINI in the last 72 hours.  BNP: BNP (last 3 results) Recent Labs    08/13/19 1437 08/16/19 1659 08/17/19 0714  BNP 1,224.9* 1,081.1* 1,056.0*    ProBNP (last 3 results) Recent Labs    05/21/19 1535  PROBNP 6,191*     D-Dimer No results for input(s): DDIMER in the last 72 hours. Hemoglobin A1C No results for input(s): HGBA1C in the last 72 hours. Fasting Lipid Panel No results for input(s): CHOL, HDL, LDLCALC, TRIG, CHOLHDL, LDLDIRECT in the last 72 hours. Thyroid Function Tests No results for input(s): TSH, T4TOTAL, T3FREE, THYROIDAB in the last 72 hours.  Invalid input(s): FREET3  Other results:   Imaging    No results found.   Medications:     Scheduled Medications: . allopurinol  50 mg Oral QODAY  . amiodarone  200 mg Oral BID  . Chlorhexidine Gluconate Cloth  6 each Topical Daily  . Chlorhexidine Gluconate Cloth  6 each Topical Q0600  . docusate sodium  200 mg  Oral Daily  . folic acid-pyridoxine-cyancobalamin  1 tablet Oral Daily  . midodrine  10 mg Oral TID WC  . polyethylene glycol  17 g Oral Daily  . pramipexole  0.25 mg Oral BID  . rosuvastatin  10 mg Oral q1800  . sodium chloride flush  10-40 mL Intracatheter Q12H  . sodium chloride flush  3 mL Intravenous Q12H  . tamsulosin  0.4 mg Oral Daily    Infusions: . sodium chloride Stopped (08/19/19 1323)  . heparin 900 Units/hr (08/24/19 0631)    PRN Medications: sodium chloride, acetaminophen, sodium chloride flush, sodium chloride flush   Assessment/Plan   Acute on chronic systolic CHF: Ischemic cardiomyopathy.  [employer] Jude ICD.  Echo in 2/21 with EF <20%,  mildly dilated RV with moderately decreased systolic function, mild AS, mild-moderate MR, severe TR.  CHF is complicated by cardiorenal syndrome and recurrent atrial fibrillation with RVR. Poor diuresis on high dose diuretics, required starting HD.  Maintaining NSR after DCCV.   - CO-OX 65% this morning, off milrinone.  - He is on midodrine 10 tid. - Nephrology is following, on HD for volume removal  CAD: S/p remote CABG (1989).  He does not think he has had a coronary evaluation since that time.  No chest pain.  HS-TnI not elevated.  - Continue Crestor.  - No ASA given anticoagulation.   Atrial fibrillation: Paroxysmal.  He was admitted with atrial fibrillation with RVR.  This may have triggered his CHF exacerbation (though based on device interrogation, worsening volume status does not seem to necessarily track with atrial fibrillation) or may be a consequence of the exacerbation.  TEE-guided DCCV done 3/24, maintaining NSR.  - Off amio gtt, now on amio 200 mg twice a day.  - Continue heparin drip.  Can switch to eliquis 2.5 mg twice a day when we know he is not having additional procedures.   AKI on CKD stage 4:  - Started HD 3/25 and will go again today.  - Nephrology appreciated.   PAD: Patient reports history of peripheral intervention though unclear  CVA: In past. Likely related to atrial fibrillation.   Length of Stay: 9  Donato Heinz, MD  08/25/2019, 8:54 AM

## 2019-08-25 NOTE — Progress Notes (Addendum)
Spoke with Dr Posey Pronto (nephrology), Dr Scot Dock (Vascular surgery) and Dr. Audie Box (attending) re PICC order and d/c PICC order.  Pt currently has RA PICC, Left ICD and order to remove current PICC and place LA PICC for mapping for fistula/graft (per Dr Nicole Cella note). Only IV medication on MAR is Heparin, also has CVP monitoring Q4hrs. Dr Posey Pronto  recommends to place PICC by IR as central access or to only have PIV or midline, but not to use left arm for PICC placement.  Dr Scot Dock agrees with Dr Posey Pronto.  Dr Audie Box to d/w other MD's re plan for this pt.  States ok to leave current PICC in place, cancel PICC order and will reorder removal upon further eval.  Otherwise, pt does have PIV for Heparin at this time.  PICC may be necessary for blood draws or CVP monitoring.  [name] RN notified by [employer].

## 2019-08-25 NOTE — Progress Notes (Signed)
Received call about PICC line, as would need to remove RUE PICC line for vein mapping.  He is off milrinone now with Co-ox 65% this am.  Recommend leaving PICC for tonight and rechecking Co-ox in morning, and if stable, can remove PICC line tomorrow.

## 2019-08-25 NOTE — Progress Notes (Signed)
Spoke with Gardiner Rhyme MD regarding PICC line.  Recommendations for leaving PICC tonight.  Secure chat received from VAS/IV team. Will pass on information to night shift nurse.

## 2019-08-25 NOTE — Progress Notes (Signed)
Nurse read note from Harrell Gave MD.  PICC line needs to be moved to the Left arm. There are no orders in place for removal of Right PICC and new PICC line to be place on Left arm.   Nurse communicated with VAS [place] regarding PICC not moved to Left at this time   Nurse spoke with Jonelle Sidle Control and instrumentation engineer) via phone regarding orders to be place by MD.

## 2019-08-25 NOTE — Consult Note (Addendum)
REASON FOR CONSULT:    To evaluate for hemodialysis access.  The consult is requested by Dr. Posey Pronto.  ASSESSMENT & PLAN:   STAGE IV CHRONIC KIDNEY DISEASE: We have been consulted for hemodialysis access.  He has a functioning tunneled dialysis catheter in the right IJ.  He has a defibrillator on the left so therefore I would recommend placement of access in the right arm.  He currently has a PICC line.  His PICC line will have to be moved to the left arm.  It would probably be best to do his vein mapping after the PICC line is moved if that is possible.  His vein map is pending and I have also ordered an upper extremity arterial duplex.  Once his work-up is complete we can try to schedule him next week for a right AV fistula or AV graft.  Of note, given that he is 57 with significant congestive heart failure (EF 20%), thrombocytopenia, and debilitated state, it would not be unreasonable to simply use the catheter for now although I know renal would prefer to have permanent access.  THROMBOCYTOPENIA: It was platelet count is 55,000.  This would have to be corrected prior to surgery.  ANTICOAGULATION: He was on apixaban for paroxysmal atrial fibrillation.  This is on hold and he is now on heparin.     Ricardo Mayo, MD Office: [phone]   HPI:   Ricardo Payne is a pleasant 86 y.o. male, who was admitted on 08/16/2019 with lower extremity swelling and shortness of breath.  He has a complicated medical history.  He has ischemic cardiomyopathy with an ejection fraction of 20%.  He has had 3 admissions for heart failure this year.  In addition he has coronary artery disease and has undergone previous coronary revascularization.  He has paroxysmal atrial fibrillation and was on apixaban which is on hold currently and he is on intravenous heparin.  The patient admits to dyspnea with minimal exertion.  He denies other uremic symptoms.  He is on dialysis here in the hospital and is to be  dialyzed today.  According to the records he does have a history of acquired thrombophilia and also thrombocytopenia.  His platelets are 55,000.  Hemoglobin is 10.6.    Past Medical History:  Diagnosis Date  . Acquired thrombophilia ([place])   . Age-related cataract of both eyes   . ASHD (arteriosclerotic heart disease)   . Benign prostatic hyperplasia   . CAD (coronary artery disease)    a. s/p CABG x1 in 1990  . Cerebellar stroke ([place]) 07/02/2016  . Chronic combined systolic and diastolic CHF (congestive heart failure) ([place])   . Chronic gout without tophus   . CKD (chronic kidney disease), stage IV ([place])   . CVA (cerebral vascular accident) ([place]) 2018  . Essential hypertension   . ICD (implantable cardioverter-defibrillator) in place   . Ischemic cardiomyopathy   . Mixed hyperlipidemia   . PAF (paroxysmal atrial fibrillation) ([place])   . Restless legs   . Secondary pulmonary arterial hypertension ([place])   . Thrombocytopenia ([place])   . Vitamin D deficiency     Family History  Problem Relation Age of Onset  . Hypertension Mother   . Suicidality Mother   . Valvular heart disease Father   . Heart disease Father   . Heart attack Brother     SOCIAL HISTORY: He is not a smoker. Social History   Socioeconomic History  . Marital status: Widowed    Spouse  name: Not on file  . Number of children: 2  . Years of education: Not on file  . Highest education level: Not on file  Occupational History  . Not on file  Tobacco Use  . Smoking status: Former Smoker    Quit date: 1988    Years since quitting: 33.2  . Smokeless tobacco: Never Used  Substance and Sexual Activity  . Alcohol use: Yes    Comment: rare  . Drug use: Never  . Sexual activity: Not on file  Other Topics Concern  . Not on file  Social History Narrative  . Not on file   Social Determinants of Health   Financial Resource Strain:   . Difficulty of Paying Living Expenses:   Food Insecurity:   . Worried  About Charity fundraiser in the Last Year:   . Arboriculturist in the Last Year:   Transportation Needs:   . Film/video editor (Medical):   Marland Kitchen Lack of Transportation (Non-Medical):   Physical Activity:   . Days of Exercise per Week:   . Minutes of Exercise per Session:   Stress:   . Feeling of Stress :   Social Connections:   . Frequency of Communication with Friends and Family:   . Frequency of Social Gatherings with Friends and Family:   . Attends Religious Services:   . Active Member of Clubs or Organizations:   . Attends Archivist Meetings:   Marland Kitchen Marital Status:   Intimate Partner Violence:   . Fear of Current or Ex-Partner:   . Emotionally Abused:   Marland Kitchen Physically Abused:   . Sexually Abused:     No Known Allergies  Current Facility-Administered Medications  Medication Dose Route Frequency Provider Last Rate Last Admin  . 0.9 %  sodium chloride infusion  250 mL Intravenous PRN Milus Banister, MD   Stopped at 08/19/19 1323  . acetaminophen (TYLENOL) tablet 650 mg  650 mg Oral Q4H PRN Milus Banister, MD   650 mg at 08/25/19 0307  . allopurinol (ZYLOPRIM) tablet 50 mg  50 mg Oral William Hamburger, MD   50 mg at 08/23/19 1051  . amiodarone (PACERONE) tablet 200 mg  200 mg Oral BID Clegg, Amy D, NP   200 mg at 08/24/19 2205  . Chlorhexidine Gluconate Cloth 2 % PADS 6 each  6 each Topical Daily O'Neal, Cassie Freer, MD   6 each at 08/24/19 1200  . Chlorhexidine Gluconate Cloth 2 % PADS 6 each  6 each Topical Q0600 Justin Mend, MD   6 each at 08/24/19 0503  . docusate sodium (COLACE) capsule 200 mg  200 mg Oral Daily Clegg, Amy D, NP   200 mg at 08/24/19 0909  . folic acid-pyridoxine-cyancobalamin (FOLTX) 2.5-25-2 MG per tablet 1 tablet  1 tablet Oral Daily O'Neal, Cassie Freer, MD   1 tablet at 08/24/19 0910  . heparin ADULT infusion 100 units/mL (25000 units/256mL sodium chloride 0.45%)  900 Units/hr Intravenous Continuous Rolla Flatten, [facility] 9  mL/hr at 08/24/19 0631 900 Units/hr at 08/24/19 0631  . midodrine (PROAMATINE) tablet 10 mg  10 mg Oral TID WC Marykay Lex, MD   10 mg at 08/25/19 1062  . multivitamin (RENA-VIT) tablet 1 tablet  1 tablet Oral QHS Elmarie Shiley, MD      . polyethylene glycol (MIRALAX / GLYCOLAX) packet 17 g  17 g Oral Daily Lyda Jester M, PA-C   17 g at  08/23/19 1053  . pramipexole (MIRAPEX) tablet 0.25 mg  0.25 mg Oral BID Milus Banister, MD   0.25 mg at 08/24/19 2205  . rosuvastatin (CRESTOR) tablet 10 mg  10 mg Oral q1800 Milus Banister, MD   10 mg at 08/24/19 2204  . sodium chloride flush (NS) 0.9 % injection 10-40 mL  10-40 mL Intracatheter Q12H O'Neal, Cassie Freer, MD   10 mL at 08/24/19 0910  . sodium chloride flush (NS) 0.9 % injection 10-40 mL  10-40 mL Intracatheter PRN O'Neal, Cassie Freer, MD      . sodium chloride flush (NS) 0.9 % injection 3 mL  3 mL Intravenous Q12H Milus Banister, MD   3 mL at 08/24/19 0910  . sodium chloride flush (NS) 0.9 % injection 3 mL  3 mL Intravenous PRN Milus Banister, MD      . tamsulosin [facility]) capsule 0.4 mg  0.4 mg Oral Daily Milus Banister, MD   0.4 mg at 08/24/19 0909    REVIEW OF SYSTEMS:  [X]  denotes positive finding, [ ]  denotes negative finding Cardiac  Comments:  Chest pain or chest pressure:    Shortness of breath upon exertion: x   Short of breath when lying flat: x   Irregular heart rhythm:        Vascular    Pain in calf, thigh, or hip brought on by ambulation:    Pain in feet at night that wakes you up from your sleep:     Blood clot in your veins:    Leg swelling:  x       Pulmonary    Oxygen at home:    Productive cough:     Wheezing:         Neurologic    Sudden weakness in arms or legs:     Sudden numbness in arms or legs:     Sudden onset of difficulty speaking or slurred speech:    Temporary loss of vision in one eye:     Problems with dizziness:         Gastrointestinal    Blood in stool:     Vomited blood:          Genitourinary    Burning when urinating:     Blood in urine:        Psychiatric    Major depression:         Hematologic    Bleeding problems:    Problems with blood clotting too easily:        Skin    Rashes or ulcers:        Constitutional    Fever or chills:     PHYSICAL EXAM:   Vitals:   08/24/19 1930 08/25/19 0100 08/25/19 0300 08/25/19 0809  BP: (!) 107/50 134/74 113/65 (!) 133/118  Pulse: 70 70 72 73  Resp: 16 18 16  (!) 24  Temp: 97.8 F (36.6 C) 98 F (36.7 C) 97.9 F (36.6 C) 98.2 F (36.8 C)  TempSrc: Oral Oral  Oral  SpO2: 100% 97% 97% 96%  Weight: 100.7 kg  100.5 kg   Height:        GENERAL: The patient is a well-nourished male, in no acute distress. The vital signs are documented above. CARDIAC: There is a regular rate and rhythm.  VASCULAR: I do not detect carotid bruits. He has palpable but slightly diminished radial pulses. I cannot palpate pedal pulses however both feet are warm and  well perfused and he has biphasic Doppler signals in the dorsalis pedis and posterior tibial positions bilaterally. He has a PICC line in his right upper arm. He has an IV in his left upper arm. He has a defibrillator on the left side. PULMONARY: There is good air exchange bilaterally without wheezing or rales. ABDOMEN: Soft and non-tender with normal pitched bowel sounds.  I do not palpate an abdominal aortic aneurysm although it is difficult to assess because of his size. MUSCULOSKELETAL: There are no major deformities or cyanosis. NEUROLOGIC: No focal weakness or paresthesias are detected. SKIN: There are no ulcers or rashes noted. PSYCHIATRIC: The patient has a normal affect.  DATA:    VEIN MAP PENDING  UPPER EXTREMITY ARTERIAL DUPLEX PENDING  LABS: Potassium yesterday was 4.3.  GFR is 10.

## 2019-08-26 ENCOUNTER — Encounter (HOSPITAL_COMMUNITY): Payer: Medicare Other

## 2019-08-26 ENCOUNTER — Other Ambulatory Visit (HOSPITAL_COMMUNITY): Payer: Medicare Other

## 2019-08-26 LAB — CBC
HCT: 33.5 % — ABNORMAL LOW (ref 39.0–52.0)
Hemoglobin: 10.9 g/dL — ABNORMAL LOW (ref 13.0–17.0)
MCH: 30 pg (ref 26.0–34.0)
MCHC: 32.5 g/dL (ref 30.0–36.0)
MCV: 92.3 fL (ref 80.0–100.0)
Platelets: 69 10*3/uL — ABNORMAL LOW (ref 150–400)
RBC: 3.63 MIL/uL — ABNORMAL LOW (ref 4.22–5.81)
RDW: 16.9 % — ABNORMAL HIGH (ref 11.5–15.5)
WBC: 6.1 10*3/uL (ref 4.0–10.5)
nRBC: 0 % (ref 0.0–0.2)

## 2019-08-26 LAB — BASIC METABOLIC PANEL
Anion gap: 19 — ABNORMAL HIGH (ref 5–15)
BUN: 109 mg/dL — ABNORMAL HIGH (ref 8–23)
CO2: 21 mmol/L — ABNORMAL LOW (ref 22–32)
Calcium: 9.1 mg/dL (ref 8.9–10.3)
Chloride: 91 mmol/L — ABNORMAL LOW (ref 98–111)
Creatinine, Ser: 6.95 mg/dL — ABNORMAL HIGH (ref 0.61–1.24)
GFR calc Af Amer: 8 mL/min — ABNORMAL LOW (ref >60)
GFR calc non Af Amer: 7 mL/min — ABNORMAL LOW (ref >60)
Glucose, Bld: 136 mg/dL — ABNORMAL HIGH (ref 70–99)
Potassium: 5 mmol/L (ref 3.5–5.1)
Sodium: 131 mmol/L — ABNORMAL LOW (ref 135–145)

## 2019-08-26 LAB — APTT
aPTT: 63 seconds — ABNORMAL HIGH (ref 24–36)
aPTT: >200 seconds (ref 24–36)
aPTT: 78 seconds — ABNORMAL HIGH (ref 24–36)

## 2019-08-26 LAB — COOXEMETRY PANEL
Carboxyhemoglobin: 1.3 % (ref 0.5–1.5)
Methemoglobin: 1.1 % (ref 0.0–1.5)
O2 Saturation: 48 %
Total hemoglobin: 11.5 g/dL — ABNORMAL LOW (ref 12.0–16.0)

## 2019-08-26 LAB — HEPARIN LEVEL (UNFRACTIONATED): Heparin Unfractionated: 0.65 IU/mL (ref 0.30–0.70)

## 2019-08-26 MED ORDER — MELATONIN 3 MG PO TABS
3.0000 mg | ORAL_TABLET | Freq: Once | ORAL | Status: AC
  Administered 2019-08-26: 3 mg via ORAL
  Filled 2019-08-26: qty 1

## 2019-08-26 NOTE — Progress Notes (Signed)
ANTICOAGULATION CONSULT NOTE - Follow Up Consult  Pharmacy Consult for Hold Eliquis > Heparin  Indication: atrial fibrillation  No Known Allergies  Patient Measurements: Height: 5' 6"  (167.6 cm) Weight: 223 lb 1.7 oz (101.2 kg)(scale a) IBW/kg (Calculated) : 63.8 Heparin Dosing Weight: 85.7 kg  Vital Signs: Temp: 97.4 F (36.3 C) (03/28 1957) Temp Source: Oral (03/28 1957) BP: 110/58 (03/28 1957) Pulse Rate: 70 (03/28 1957)  Labs: Recent Labs    08/24/19 0335 08/24/19 0335 08/25/19 0602 08/25/19 0602 08/25/19 1027 08/26/19 0621 08/26/19 2017 08/26/19 2228  HGB 10.6*   < > 10.6*  --   --  10.9*  --   --   HCT 32.7*  --  31.8*  --   --  33.5*  --   --   PLT 67*  --  55*  --   --  69*  --   --   APTT 73*   < > 76*   < >  --  63* >200* 78*  HEPARINUNFRC 1.14*  --  0.79*  --   --  0.65  --   --   CREATININE 4.97*  --   --   --  6.25* 6.95*  --   --    < > = values in this interval not displayed.    Estimated Creatinine Clearance: 8.5 mL/min (A) (by C-G formula based on SCr of 6.95 mg/dL (H)).   Medical History: Past Medical History:  Diagnosis Date  . Acquired thrombophilia ([place])   . Age-related cataract of both eyes   . ASHD (arteriosclerotic heart disease)   . Benign prostatic hyperplasia   . CAD (coronary artery disease)    a. s/p CABG x1 in 1990  . Cerebellar stroke ([place]) 07/02/2016  . Chronic combined systolic and diastolic CHF (congestive heart failure) ([place])   . Chronic gout without tophus   . CKD (chronic kidney disease), stage IV ([place])   . CVA (cerebral vascular accident) ([place]) 2018  . Essential hypertension   . ICD (implantable cardioverter-defibrillator) in place   . Ischemic cardiomyopathy   . Mixed hyperlipidemia   . PAF (paroxysmal atrial fibrillation) ([place])   . Restless legs   . Secondary pulmonary arterial hypertension ([place])   . Thrombocytopenia ([place])   . Vitamin D deficiency     Assessment: 86 yo male on chronic apixaban for afib.   Pharmacy asked to start IV heparin while apixaban on hold for HD catheter placement; last dose ~8 AM on 08/21/19. We will need to monitor anticoagulation based on aPTTs at this point, as heparin levels will be falsely elevated by apixaban.   VVS met with patient yesterday and decided to use temporary access for HD, with plans to decide permanent access in the future and was okay with resuming PTA Eliquis. Cardiology note states to continue heparin and restart PTA Eliquis when all procedures are finalized. Will follow up conversations/plans for procedures to know when able to resume PTA Eliquis.    PM PTT therapeutic   Goal of Therapy:  Heparin level 0.3-0.7 units/ml aPTT 66-102 seconds Monitor platelets by anticoagulation protocol: Yes   Plan:  Continue heparin at 950 units / hr Daily heparin level and aPTT until levels correlate  Daily CBC and monitor platelets closely  Monitor for bleeding  Follow up to resume PTA apixaban    Thank you,  Anette Guarneri, PharmD  Please check amion for clinical pharmacist contact number

## 2019-08-26 NOTE — Progress Notes (Signed)
   VASCULAR SURGERY ASSESSMENT & PLAN:   STAGE IV CHRONIC KIDNEY DISEASE: This patient has multiple complicating issues including poor IV access, thrombocytopenia, significant congestive heart failure with an ejection fraction of 20%, and multiple medical comorbidities.  In order to proceed with access we would need to remove the PICC line from his right arm.  I discussed this with the IV team and they would not recommend placing a PICC line on the left side given that he has a defibrillator on that side.  The same reason I would not place access in his left arm.  His vein map is pending.  I had a long discussion with the patient this morning about the options.  All things considered I would favor continuing to use his hemodialysis catheter for now and holding off on permanent access.  The patient is in agreement with that plan.  If he shows significant clinical improvement in the future then certainly we could proceed with placement of a right AV fistula or AV graft.  Therefore for now I think he could be restarted on his Eliquis.  THROMBOCYTOPENIA: His thrombocytopenia is improved today.  Platelets are up to 69,000.  SUBJECTIVE:   No specific complaints this morning.  PHYSICAL EXAM:   Vitals:   08/25/19 1942 08/26/19 0014 08/26/19 0016 08/26/19 0400  BP: 107/69 (!) 110/59  92/77  Pulse: 72 68 72 72  Resp: (!) 24 20 17 19   Temp: 98.6 F (37 C) 98.7 F (37.1 C)  98.2 F (36.8 C)  TempSrc: Oral     SpO2: 99% 98% 95% 100%  Weight:    101.2 kg  Height:       Palpable right radial pulse. PICC line present in right upper arm.  LABS:   Lab Results  Component Value Date   WBC 6.1 08/26/2019   HGB 10.9 (L) 08/26/2019   HCT 33.5 (L) 08/26/2019   MCV 92.3 08/26/2019   PLT 69 (L) 08/26/2019   Lab Results  Component Value Date   CREATININE 6.95 (H) 08/26/2019   Lab Results  Component Value Date   INR 1.8 (H) 08/21/2019   CBG (last 3)  No results for input(s): GLUCAP in the last  72 hours.  PROBLEM LIST:    Active Problems:   AKI (acute kidney injury) ([place])   Acute decompensated heart failure (HCC)   CURRENT MEDS:   . allopurinol  50 mg Oral QODAY  . amiodarone  200 mg Oral BID  . Chlorhexidine Gluconate Cloth  6 each Topical Daily  . Chlorhexidine Gluconate Cloth  6 each Topical Q0600  . docusate sodium  200 mg Oral Daily  . folic acid-pyridoxine-cyancobalamin  1 tablet Oral Daily  . midodrine  10 mg Oral TID WC  . multivitamin  1 tablet Oral QHS  . polyethylene glycol  17 g Oral Daily  . pramipexole  0.25 mg Oral BID  . rosuvastatin  10 mg Oral q1800  . sodium chloride flush  10-40 mL Intracatheter Q12H  . sodium chloride flush  3 mL Intravenous Q12H  . tamsulosin  0.4 mg Oral Daily    [name] Office: [phone] 08/26/2019

## 2019-08-26 NOTE — Progress Notes (Signed)
Patient one episode of emesis (consistency of meal eaten) approx 1300. No distress, no further GI complaints. Upon inquiry, likely attributed to eating quickly.

## 2019-08-26 NOTE — Progress Notes (Signed)
Patient ID: Ricardo Payne, male   DOB: Jun 28, 1930, 86 y.o.   MRN: [id number] [facility] Progress Note   Assessment/ Plan:   1. Acute kidney Injury on chronic kidney disease stage IV: Hemodynamically mediated secondary to CHF exacerbation and unfortunately failed maximal medical therapy for which he was started on dialysis 3 days ago.  I will order his next hemodialysis treatment tomorrow (his third) and maintain him on a MWF schedule in the hospital as we await outpatient dialysis unit placement.  Evaluated yesterday by Dr. Scot Dock and the plan is to continue dialysis via [facility] given technical difficulties with getting permanent access. 2.  Acute exacerbation of congestive heart failure: Did not respond to inotropic support and diuretics and no changes to treatment response following cardioversion.  Clinically improving following dialysis. 3.  Anemia: Hemoglobin and hematocrit currently at goal: No overt losses.  Monitor for ESA. 4.  History of atrial fibrillation status post cardioversion: Currently on amiodarone and heparin drip-hopefully we can have vascular access placed before conversion to oral anticoagulation.  Subjective:   Feeling somewhat tired this morning.  Inquires about removal of PICC line.   Objective:   BP 92/77   Pulse 72   Temp 97.6 F (36.4 C) (Oral)   Resp 19   Ht 5\' 6"  (1.676 m)   Wt 101.2 kg Comment: scale a  SpO2 100%   BMI 36.01 kg/m   Intake/Output Summary (Last 24 hours) at 08/26/2019 0904 Last data filed at 08/26/2019 0017 Gross per 24 hour  Intake 240 ml  Output 150 ml  Net 90 ml   Weight change: -2 kg  Physical Exam: Gen: Resting comfortably in bed CVS: Pulse regular rhythm, normal rate, S1 and S2 normal Resp: Diminished breath sounds over bases, no rhonchi.  Right IJ TDC Abd: Soft, obese, nontender Ext: 1+ lower extremity edema  Imaging: [place] EKG SITE RITE  Result Date: 08/25/2019 If Site Rite image not attached, placement could  not be confirmed due to current cardiac rhythm.   Labs: BMET Recent Labs  Lab 08/20/19 0500 08/21/19 0546 08/22/19 0438 08/23/19 0258 08/24/19 0335 08/25/19 1027 08/26/19 0621  NA 135 135 132* 132* 133* 131* 131*  K 4.4 3.8 3.9 4.2 4.3 4.4 5.0  CL 96* 96* 93* 92* 91* 92* 91*  CO2 27 26 25 24 23 22  21*  GLUCOSE 154* 175* 128* 127* 120* 118* 136*  BUN 64* 72* 78* 88* 91* 100* 109*  CREATININE 2.60* 3.09* 3.71* 4.21* 4.97* 6.25* 6.95*  CALCIUM 9.0 8.8* 8.7* 8.7* 9.1 8.6* 9.1   CBC Recent Labs  Lab 08/23/19 0258 08/24/19 0335 08/25/19 0602 08/26/19 0621  WBC 5.9 5.6 5.3 6.1  HGB 10.2* 10.6* 10.6* 10.9*  HCT 31.6* 32.7* 31.8* 33.5*  MCV 95.2 93.2 93.5 92.3  PLT 80* 67* 55* 69*   Medications:    . allopurinol  50 mg Oral QODAY  . amiodarone  200 mg Oral BID  . Chlorhexidine Gluconate Cloth  6 each Topical Daily  . Chlorhexidine Gluconate Cloth  6 each Topical Q0600  . docusate sodium  200 mg Oral Daily  . folic acid-pyridoxine-cyancobalamin  1 tablet Oral Daily  . midodrine  10 mg Oral TID WC  . multivitamin  1 tablet Oral QHS  . polyethylene glycol  17 g Oral Daily  . pramipexole  0.25 mg Oral BID  . rosuvastatin  10 mg Oral q1800  . sodium chloride flush  10-40 mL Intracatheter Q12H  . sodium chloride  flush  3 mL Intravenous Q12H  . tamsulosin  0.4 mg Oral Daily   Elmarie Shiley, MD 08/26/2019, 9:04 AM

## 2019-08-26 NOTE — Progress Notes (Signed)
ANTICOAGULATION CONSULT NOTE - Follow Up Consult  Pharmacy Consult for Hold Eliquis > Heparin  Indication: atrial fibrillation  No Known Allergies  Patient Measurements: Height: 5' 6" (167.6 cm) Weight: 223 lb 1.7 oz (101.2 kg)(scale a) IBW/kg (Calculated) : 63.8 Heparin Dosing Weight: 85.7 kg  Vital Signs: Temp: 97.6 F (36.4 C) (03/28 0700) Temp Source: Oral (03/28 0700) BP: 98/59 (03/28 1035) Pulse Rate: 74 (03/28 1035)  Labs: Recent Labs    08/24/19 0335 08/24/19 0335 08/25/19 0602 08/25/19 1027 08/26/19 0621  HGB 10.6*   < > 10.6*  --  10.9*  HCT 32.7*  --  31.8*  --  33.5*  PLT 67*  --  55*  --  69*  APTT 73*  --  76*  --  63*  HEPARINUNFRC 1.14*  --  0.79*  --  0.65  CREATININE 4.97*  --   --  6.25* 6.95*   < > = values in this interval not displayed.    Estimated Creatinine Clearance: 8.5 mL/min (A) (by C-G formula based on SCr of 6.95 mg/dL (H)).   Medical History: Past Medical History:  Diagnosis Date  . Acquired thrombophilia (HCC)   . Age-related cataract of both eyes   . ASHD (arteriosclerotic heart disease)   . Benign prostatic hyperplasia   . CAD (coronary artery disease)    a. s/p CABG x1 in 1990  . Cerebellar stroke (HCC) 07/02/2016  . Chronic combined systolic and diastolic CHF (congestive heart failure) (HCC)   . Chronic gout without tophus   . CKD (chronic kidney disease), stage IV (HCC)   . CVA (cerebral vascular accident) (HCC) 2018  . Essential hypertension   . ICD (implantable cardioverter-defibrillator) in place   . Ischemic cardiomyopathy   . Mixed hyperlipidemia   . PAF (paroxysmal atrial fibrillation) (HCC)   . Restless legs   . Secondary pulmonary arterial hypertension (HCC)   . Thrombocytopenia (HCC)   . Vitamin D deficiency     Assessment: 86 yo male on chronic apixaban for afib.  Pharmacy asked to start IV heparin while apixaban on hold for HD catheter placement; last dose ~8 AM on 08/21/19. We will need to monitor  anticoagulation based on aPTTs at this point, as heparin levels will be falsely elevated by apixaban.   This mornings aPTT is slightly subtherapeutic at 63 seconds, heparin level remains falsely elevated at 0.65 as expected given his renal function. H&H is low but stable at 10.9/33.5, plts are low but up from yesterday at 69.   VVS met with patient yesterday and decided to use temporary access for HD, with plans to decide permanent access in the future and was okay with resuming PTA Eliquis. Cardiology note states to continue heparin and restart PTA Eliquis when all procedures are finalized. Will follow up conversations/plans for procedures to know when able to resume PTA Eliquis.    Goal of Therapy:  Heparin level 0.3-0.7 units/ml aPTT 66-102 seconds Monitor platelets by anticoagulation protocol: Yes   Plan:  Slightly increase heparin to 950 units/hr 8 hour aPTT  Daily heparin level and aPTT until levels correlate  Daily CBC and monitor platelets closely  Monitor for bleeding  Follow up to resume PTA apixaban    Thank you,   Andrea Lippucci, PharmD PGY-1 Pharmacy Resident   Please check amion for clinical pharmacist contact number    

## 2019-08-26 NOTE — Progress Notes (Signed)
Patient ID: Ricardo Payne, male   DOB: 1930-06-09, 86 y.o.   MRN: [id number]     Advanced Heart Failure Rounding Note  PCP-Cardiologist: Donato Heinz, MD   Subjective:    Reports dyspnea improving    Objective:   Weight Range: 101.2 kg Body mass index is 36.01 kg/m.   Vital Signs:   Temp:  [97.6 F (36.4 C)-98.7 F (37.1 C)] 97.6 F (36.4 C) (03/28 0700) Pulse Rate:  [68-74] 72 (03/28 0400) Resp:  [16-24] 19 (03/28 0400) BP: (92-121)/(59-77) 92/77 (03/28 0400) SpO2:  [95 %-100 %] 100 % (03/28 0400) Weight:  [101.2 kg] 101.2 kg (03/28 0400) Last BM Date: 08/23/19  Weight change: Filed Weights   08/24/19 1930 08/25/19 0300 08/26/19 0400  Weight: 100.7 kg 100.5 kg 101.2 kg    Intake/Output:   Intake/Output Summary (Last 24 hours) at 08/26/2019 0940 Last data filed at 08/26/2019 0017 Gross per 24 hour  Intake 240 ml  Output 150 ml  Net 90 ml      Physical Exam   General:  Well appearing. No resp difficulty HEENT: normal Neck: supple.+ JVP Cor: Regular rate & rhythm. No rubs, gallops or murmurs. R tunneled HD catheter.  Lungs: clear Abdomen: soft, nontender, nondistended. No hepatosplenomegaly. No bruits or masses. Good bowel sounds. Extremities: no cyanosis, clubbing, rash, R and LLE trace edema Neuro: alert & orientedx3, cranial nerves grossly intact. moves all 4 extremities w/o difficulty. Affect pleasant   Telemetry   NSR 70s personally reviewed.   Labs    CBC Recent Labs    08/25/19 0602 08/26/19 0621  WBC 5.3 6.1  HGB 10.6* 10.9*  HCT 31.8* 33.5*  MCV 93.5 92.3  PLT 55* 69*   Basic Metabolic Panel Recent Labs    08/24/19 0335 08/24/19 0335 08/25/19 1027 08/26/19 0621  NA 133*   < > 131* 131*  K 4.3   < > 4.4 5.0  CL 91*   < > 92* 91*  CO2 23   < > 22 21*  GLUCOSE 120*   < > 118* 136*  BUN 91*   < > 100* 109*  CREATININE 4.97*   < > 6.25* 6.95*  CALCIUM 9.1   < > 8.6* 9.1  MG 2.4  --   --   --    < > = values in  this interval not displayed.   Liver Function Tests No results for input(s): AST, ALT, ALKPHOS, BILITOT, PROT, ALBUMIN in the last 72 hours. No results for input(s): LIPASE, AMYLASE in the last 72 hours. Cardiac Enzymes No results for input(s): CKTOTAL, CKMB, CKMBINDEX, TROPONINI in the last 72 hours.  BNP: BNP (last 3 results) Recent Labs    08/13/19 1437 08/16/19 1659 08/17/19 0714  BNP 1,224.9* 1,081.1* 1,056.0*    ProBNP (last 3 results) Recent Labs    05/21/19 1535  PROBNP 6,191*     D-Dimer No results for input(s): DDIMER in the last 72 hours. Hemoglobin A1C No results for input(s): HGBA1C in the last 72 hours. Fasting Lipid Panel No results for input(s): CHOL, HDL, LDLCALC, TRIG, CHOLHDL, LDLDIRECT in the last 72 hours. Thyroid Function Tests No results for input(s): TSH, T4TOTAL, T3FREE, THYROIDAB in the last 72 hours.  Invalid input(s): FREET3  Other results:   Imaging    [place] EKG SITE RITE  Result Date: 08/25/2019 If Site Rite image not attached, placement could not be confirmed due to current cardiac rhythm.    Medications:  Scheduled Medications: . allopurinol  50 mg Oral QODAY  . amiodarone  200 mg Oral BID  . Chlorhexidine Gluconate Cloth  6 each Topical Daily  . Chlorhexidine Gluconate Cloth  6 each Topical Q0600  . docusate sodium  200 mg Oral Daily  . folic acid-pyridoxine-cyancobalamin  1 tablet Oral Daily  . midodrine  10 mg Oral TID WC  . multivitamin  1 tablet Oral QHS  . polyethylene glycol  17 g Oral Daily  . pramipexole  0.25 mg Oral BID  . rosuvastatin  10 mg Oral q1800  . sodium chloride flush  10-40 mL Intracatheter Q12H  . sodium chloride flush  3 mL Intravenous Q12H  . tamsulosin  0.4 mg Oral Daily    Infusions: . sodium chloride Stopped (08/19/19 1323)  . heparin 900 Units/hr (08/25/19 1028)    PRN Medications: sodium chloride, acetaminophen, sodium chloride flush, sodium chloride flush   Assessment/Plan    Acute on chronic systolic CHF: Ischemic cardiomyopathy.  St Jude ICD.  Echo in 2/21 with EF <20%, mildly dilated RV with moderately decreased systolic function, mild AS, mild-moderate MR, severe TR.  CHF is complicated by cardiorenal syndrome and recurrent atrial fibrillation with RVR. Poor diuresis on high dose diuretics, required starting HD.  Maintaining NSR after DCCV.   - He is on midodrine 10 tid.  Continues to have soft BP, will continue midodrine - Nephrology is following, on HD for volume removal.  Planning HD today  CAD: S/p remote CABG (1989).  He does not think he has had a coronary evaluation since that time.  No chest pain.  HS-TnI not elevated.  - Continue Crestor.  - No ASA given anticoagulation.   Atrial fibrillation: Paroxysmal.  He was admitted with atrial fibrillation with RVR.  This may have triggered his CHF exacerbation (though based on device interrogation, worsening volume status does not seem to necessarily track with atrial fibrillation) or may be a consequence of the exacerbation.  TEE-guided DCCV done 3/24, maintaining NSR.  - Off amio gtt, now on amio 200 mg twice a day.  - Continue heparin drip.  Can switch to eliquis 2.5 mg twice a day when we know he is not having additional procedures.   AKI on CKD stage 4:  - Started HD 3/25 - Nephrology appreciated.  - Vascular surgery evaluated for AV graft/fistula, recommended holding off for now and continuing HD access through catheter  PAD: Patient reports history of peripheral intervention though unclear  CVA: In past. Likely related to atrial fibrillation.   Length of Stay: [place], MD  08/26/2019, 9:40 AM

## 2019-08-27 LAB — CBC
HCT: 32.2 % — ABNORMAL LOW (ref 39.0–52.0)
Hemoglobin: 10.6 g/dL — ABNORMAL LOW (ref 13.0–17.0)
MCH: 30.3 pg (ref 26.0–34.0)
MCHC: 32.9 g/dL (ref 30.0–36.0)
MCV: 92 fL (ref 80.0–100.0)
Platelets: 66 10*3/uL — ABNORMAL LOW (ref 150–400)
RBC: 3.5 MIL/uL — ABNORMAL LOW (ref 4.22–5.81)
RDW: 16.9 % — ABNORMAL HIGH (ref 11.5–15.5)
WBC: 5.5 10*3/uL (ref 4.0–10.5)
nRBC: 0 % (ref 0.0–0.2)

## 2019-08-27 LAB — BASIC METABOLIC PANEL
Anion gap: 20 — ABNORMAL HIGH (ref 5–15)
BUN: 115 mg/dL — ABNORMAL HIGH (ref 8–23)
CO2: 22 mmol/L (ref 22–32)
Calcium: 9 mg/dL (ref 8.9–10.3)
Chloride: 91 mmol/L — ABNORMAL LOW (ref 98–111)
Creatinine, Ser: 7.54 mg/dL — ABNORMAL HIGH (ref 0.61–1.24)
GFR calc Af Amer: 7 mL/min — ABNORMAL LOW (ref >60)
GFR calc non Af Amer: 6 mL/min — ABNORMAL LOW (ref >60)
Glucose, Bld: 98 mg/dL (ref 70–99)
Potassium: 4.9 mmol/L (ref 3.5–5.1)
Sodium: 133 mmol/L — ABNORMAL LOW (ref 135–145)

## 2019-08-27 LAB — COOXEMETRY PANEL
Carboxyhemoglobin: 1.5 % (ref 0.5–1.5)
Methemoglobin: 1.2 % (ref 0.0–1.5)
O2 Saturation: 61.5 %
Total hemoglobin: 10.9 g/dL — ABNORMAL LOW (ref 12.0–16.0)

## 2019-08-27 LAB — HEPARIN LEVEL (UNFRACTIONATED): Heparin Unfractionated: 0.52 IU/mL (ref 0.30–0.70)

## 2019-08-27 LAB — APTT: aPTT: 79 seconds — ABNORMAL HIGH (ref 24–36)

## 2019-08-27 MED ORDER — NON FORMULARY: 5.0000 mg | Freq: Every day | Status: DC

## 2019-08-27 MED ORDER — HEPARIN SODIUM (PORCINE) 1000 UNIT/ML IJ SOLN
INTRAMUSCULAR | Status: AC
  Filled 2019-08-27: qty 4

## 2019-08-27 MED ORDER — MELATONIN 3 MG PO TABS
6.0000 mg | ORAL_TABLET | Freq: Every day | ORAL | Status: DC
  Administered 2019-08-27 – 2019-09-10 (×15): 6 mg via ORAL
  Filled 2019-08-27 (×15): qty 2

## 2019-08-27 NOTE — Progress Notes (Signed)
ANTICOAGULATION CONSULT NOTE - Follow Up Consult  Pharmacy Consult for Hold Eliquis > Heparin  Indication: atrial fibrillation  No Known Allergies  Patient Measurements: Height: 5' 6"  (167.6 cm) Weight: 228 lb 2.8 oz (103.5 kg) IBW/kg (Calculated) : 63.8 Heparin Dosing Weight: 85.7 kg  Vital Signs: Temp: 98 F (36.7 C) (03/29 1230) Temp Source: Oral (03/29 1230) BP: 106/66 (03/29 1430) Pulse Rate: 77 (03/29 1430)  Labs: Recent Labs    08/25/19 0602 08/25/19 0602 08/25/19 1027 08/26/19 0621 08/26/19 0621 08/26/19 2017 08/26/19 2228 08/27/19 0437  HGB 10.6*   < >  --  10.9*  --   --   --  10.6*  HCT 31.8*  --   --  33.5*  --   --   --  32.2*  PLT 55*  --   --  69*  --   --   --  66*  APTT 76*   < >  --  63*   < > >200* 78* 79*  HEPARINUNFRC 0.79*  --   --  0.65  --   --   --  0.52  CREATININE  --   --  6.25* 6.95*  --   --   --  7.54*   < > = values in this interval not displayed.    Estimated Creatinine Clearance: 7.9 mL/min (A) (by C-G formula based on SCr of 7.54 mg/dL (H)).   Medical History: Past Medical History:  Diagnosis Date  . Acquired thrombophilia ([place])   . Age-related cataract of both eyes   . ASHD (arteriosclerotic heart disease)   . Benign prostatic hyperplasia   . CAD (coronary artery disease)    a. s/p CABG x1 in 1990  . Cerebellar stroke ([place]) 07/02/2016  . Chronic combined systolic and diastolic CHF (congestive heart failure) ([place])   . Chronic gout without tophus   . CKD (chronic kidney disease), stage IV ([place])   . CVA (cerebral vascular accident) ([place]) 2018  . Essential hypertension   . ICD (implantable cardioverter-defibrillator) in place   . Ischemic cardiomyopathy   . Mixed hyperlipidemia   . PAF (paroxysmal atrial fibrillation) ([place])   . Restless legs   . Secondary pulmonary arterial hypertension ([place])   . Thrombocytopenia ([place])   . Vitamin D deficiency     Assessment: 86 yo male on chronic apixaban for afib.  Pharmacy asked  to start IV heparin while apixaban on hold for HD catheter placement; last dose ~8 AM on 08/21/19. We will need to monitor anticoagulation based on aPTTs at this point, as heparin levels will be falsely elevated by apixaban.   VVS met with patient yesterday and decided to use temporary access for HD, with plans to decide permanent access in the future and was okay with resuming PTA Eliquis. Cardiology note states to continue heparin and restart PTA Eliquis when all procedures are finalized. Will follow up conversations/plans for procedures to know when able to resume PTA Eliquis.   Heparin level is therapeutic at 0.52, aPTT is also therapeutic at 79 - correlating. Hgb 10.6, plt 66. No s/sx of bleeding or infusion issues.  Goal of Therapy:  Heparin level 0.3-0.7 units/ml aPTT 66-102 seconds Monitor platelets by anticoagulation protocol: Yes   Plan:  Continue heparin at 950 units / hr Daily heparin level since aPTT now correlating with HL Daily CBC and monitor platelets closely  Monitor for bleeding  Follow up to resume PTA apixaban    Thank you,  Antonietta Jewel, PharmD,  BCCCP Clinical Pharmacist  Phone: [phone]  Please check AMION for all [place] phone numbers After 10:00 PM, call [place] [phone]

## 2019-08-27 NOTE — Plan of Care (Signed)
  Problem: Education: Goal: Ability to demonstrate management of disease process will improve Outcome: Progressing   Problem: Education: Goal: Ability to verbalize understanding of medication therapies will improve Outcome: Progressing   Problem: Education: Goal: Individualized Educational Video(s) Outcome: Progressing   Problem: Cardiac: Goal: Ability to achieve and maintain adequate cardiopulmonary perfusion will improve Outcome: Progressing

## 2019-08-27 NOTE — Progress Notes (Signed)
Physical Therapy Cancellation Note   08/27/19 1350  PT Visit Information  Last PT Received On 08/27/19  Reason Eval/Treat Not Completed Patient at procedure or test/unavailable. Pt off unit for HD. PT will continue to follow acutely.   Earney Navy, PTA Acute Rehabilitation Services Pager: [phone] Office: [phone]

## 2019-08-27 NOTE — Progress Notes (Addendum)
Patient ID: Ricardo Payne, male   DOB: 1930/12/03, 86 y.o.   MRN: [id number]     Advanced Heart Failure Rounding Note  PCP-Cardiologist: Donato Heinz, MD   Subjective:    Remains SOB with exertion.   Had iHD 3/25 and 3/26   Objective:   Weight Range: 101.4 kg Body mass index is 36.09 kg/m.   Vital Signs:   Temp:  [97.4 F (36.3 C)-97.8 F (36.6 C)] 97.8 F (36.6 C) (03/29 0828) Pulse Rate:  [66-77] 76 (03/29 0828) Resp:  [16-25] 17 (03/29 0828) BP: (92-123)/(41-80) 117/73 (03/29 0828) SpO2:  [96 %-100 %] 98 % (03/29 0828) Weight:  [101.4 kg] 101.4 kg (03/29 0336) Last BM Date: 08/23/19  Weight change: Filed Weights   08/25/19 0300 08/26/19 0400 08/27/19 0336  Weight: 100.5 kg 101.2 kg 101.4 kg    Intake/Output:   Intake/Output Summary (Last 24 hours) at 08/27/2019 0940 Last data filed at 08/27/2019 0430 Gross per 24 hour  Intake 925 ml  Output 170 ml  Net 755 ml      Physical Exam  CVP 24  General:  Sitting in the chair.  No resp difficulty HEENT: normal Neck: supple. no JVD. Carotids 2+ bilat; no bruits. No lymphadenopathy or thryomegaly appreciated. Cor: PMI nondisplaced. Regular rate & rhythm. No rubs, gallops or murmurs. R tunneled HD cath.  Lungs: clear Abdomen: edema 1+ t, nontender, distended. No hepatosplenomegaly. No bruits or masses. Good bowel sounds. Extremities: no cyanosis, clubbing, rash, RUE and LUE ecchymotics. RLE LLE 1+ edema Neuro: alert & orientedx3, cranial nerves grossly intact. moves all 4 extremities w/o difficulty. Affect pleasant    Telemetry   NSR 80s personally reviewed.    Labs    CBC Recent Labs    08/26/19 0621 08/27/19 0437  WBC 6.1 5.5  HGB 10.9* 10.6*  HCT 33.5* 32.2*  MCV 92.3 92.0  PLT 69* 66*   Basic Metabolic Panel Recent Labs    08/26/19 0621 08/27/19 0437  NA 131* 133*  K 5.0 4.9  CL 91* 91*  CO2 21* 22  GLUCOSE 136* 98  BUN 109* 115*  CREATININE 6.95* 7.54*  CALCIUM 9.1 9.0    Liver Function Tests No results for input(s): AST, ALT, ALKPHOS, BILITOT, PROT, ALBUMIN in the last 72 hours. No results for input(s): LIPASE, AMYLASE in the last 72 hours. Cardiac Enzymes No results for input(s): CKTOTAL, CKMB, CKMBINDEX, TROPONINI in the last 72 hours.  BNP: BNP (last 3 results) Recent Labs    08/13/19 1437 08/16/19 1659 08/17/19 0714  BNP 1,224.9* 1,081.1* 1,056.0*    ProBNP (last 3 results) Recent Labs    05/21/19 1535  PROBNP 6,191*     D-Dimer No results for input(s): DDIMER in the last 72 hours. Hemoglobin A1C No results for input(s): HGBA1C in the last 72 hours. Fasting Lipid Panel No results for input(s): CHOL, HDL, LDLCALC, TRIG, CHOLHDL, LDLDIRECT in the last 72 hours. Thyroid Function Tests No results for input(s): TSH, T4TOTAL, T3FREE, THYROIDAB in the last 72 hours.  Invalid input(s): FREET3  Other results:   Imaging    No results found.   Medications:     Scheduled Medications: . allopurinol  50 mg Oral QODAY  . amiodarone  200 mg Oral BID  . Chlorhexidine Gluconate Cloth  6 each Topical Daily  . Chlorhexidine Gluconate Cloth  6 each Topical Q0600  . docusate sodium  200 mg Oral Daily  . folic acid-pyridoxine-cyancobalamin  1 tablet Oral Daily  .  midodrine  10 mg Oral TID WC  . multivitamin  1 tablet Oral QHS  . polyethylene glycol  17 g Oral Daily  . pramipexole  0.25 mg Oral BID  . rosuvastatin  10 mg Oral q1800  . sodium chloride flush  10-40 mL Intracatheter Q12H  . sodium chloride flush  3 mL Intravenous Q12H  . tamsulosin  0.4 mg Oral Daily    Infusions: . sodium chloride Stopped (08/19/19 1323)  . heparin 950 Units/hr (08/26/19 1302)    PRN Medications: sodium chloride, acetaminophen, sodium chloride flush, sodium chloride flush   Assessment/Plan   1. Acute on chronic systolic CHF: Ischemic cardiomyopathy.  St Jude ICD.  Echo in 2/21 with EF <20%, mildly dilated RV with moderately decreased systolic  function, mild AS, mild-moderate MR, severe TR.  CHF is complicated by cardiorenal syndrome and recurrent atrial fibrillation with RVR.  Suspect low output HF.  He was on milrinone but now off.  -   Maintaining NSR after DCCV.  Started iHD 3/25. Plan for iHD today.  - CO-OX 61.5%.  - He is on midodrine 10 tid. - Nephrology is following, started on HD  2. CAD: S/p remote CABG (1989).  He does not think he has had a coronary evaluation since that time.  No chest pain.  HS-TnI not elevated.  - No chest pain.  - Continue Crestor.  - No ASA given anticoagulation.  3. Atrial fibrillation: Paroxysmal.  He was admitted with atrial fibrillation with RVR.  This may have triggered his CHF exacerbation (though based on device interrogation, worsening volume status does not seem to necessarily track with atrial fibrillation) or may be a consequence of the exacerbation.  TEE-guided DCCV done 3/24, maintaining NSR. - Stop amio drip. Start amio 200 mg twice a day.  - Continue heparin drip.  Can switch to eliquis 2.5 mg twice a day when we know he is not having additional procedures.  4. AKI on CKD stage 4:  - Started HD 3/25 - Tolerating iHD so far.  - Nephrology appreciated.  5. PAD: Patient reports history of peripheral intervention though I do not see this is in history.  6. CVA: In past. Likely related to atrial fibrillation.   Ambulate today.   Length of Stay: [place], NP  08/27/2019, 9:40 AM  Advanced Heart Failure Team Pager [phone] (M-F; 7a - 4p)  Please contact [facility] for night-coverage after hours (4p -7a ) and weekends on [email or web address]  Patient seen and examined with the above-signed Advanced Practice Provider and/or Housestaff. I personally reviewed laboratory data, imaging studies and relevant notes. I independently examined the patient and formulated the important aspects of the plan. I have edited the note to reflect any of my changes or salient points. I have personally  discussed the plan with the patient and/or family.  Feels OK but still weak and dyspneic with minimal exertion. CVP way up. Volume managed by HD. Tolerating HD this evening on midodrine. May need an extra session this week. Remains in NSR. Co-ox ok. Still on heparin. Need to switch to Eliquis soon once decision made on access. He is otherwise ready for d/c from a cardiac perspective once fluid off.   Glori Bickers, MD  6:29 PM

## 2019-08-27 NOTE — Progress Notes (Signed)
[facility] ROUNDING NOTE   Subjective:   This is a 86 year old gentleman with ischemic cardiomyopathy St. Jude's ICD echo with a EF of less than 20% mildly dilated RV moderate decrease systolic function mild AI is mild to moderate MR severe TR.  He has congestive heart failure complicated by cardiorenal syndrome and recurrent atrial fibrillation with rapid ventricular response.  He had a poor response to diuretics and started on maintenance hemodialysis.  Midodrine 10 mg 3 times daily has been used in order to support blood pressure.  He has a tunneled dialysis cath and has been evaluated by Dr. [name] for placement of permanent access.  I agree with Dr. Scot Dock placement of a permanent access would not necessarily be indicated in view of the multiple comorbidities affecting this gentleman as well as technical difficulties in placement..  Blood pressure 117/73 pulse 72 temperature 97.8 O2 sats 98% nasal cannula  Last dialysis 08/24/2019 removal of 2.5 L  Sodium 133 potassium 4.9 chloride 91 CO2 22 BUN 115 creatinine 7.54 calcium 9.0 glucose 98 hemoglobin 10.6 platelets 66  Allopurinol 50 mg every other day amiodarone 200 mg twice daily, midodrine 10 mg 3 times daily multivitamins 1 daily, Mirapex 0.25 mg twice daily Crestor 10 mg daily Flomax 0.4 mg daily  IV heparin    Objective:  Vital signs in last 24 hours:  Temp:  [97.4 F (36.3 C)-97.8 F (36.6 C)] 97.8 F (36.6 C) (03/29 0828) Pulse Rate:  [66-77] 76 (03/29 0828) Resp:  [16-25] 17 (03/29 0828) BP: (92-123)/(41-80) 117/73 (03/29 0828) SpO2:  [96 %-100 %] 98 % (03/29 0828) Weight:  [101.4 kg] 101.4 kg (03/29 0336)  Weight change: 0.224 kg Filed Weights   08/25/19 0300 08/26/19 0400 08/27/19 0336  Weight: 100.5 kg 101.2 kg 101.4 kg    Intake/Output: I/O last 3 completed shifts: In: 1610 [P.O.:1150; I.V.:15] Out: 220 [Urine:220]   Intake/Output this shift:  No intake/output data  recorded.  Gen: Resting comfortably in bed CVS: Pulse regular rhythm, normal rate, S1 and S2 normal Resp: Diminished breath sounds over bases, no rhonchi.  Right IJ TDC Abd: Soft, obese, nontender Ext: 1+ lower extremity edema   Basic Metabolic Panel: Recent Labs  Lab 08/21/19 0546 08/21/19 0546 08/22/19 0438 08/22/19 0438 08/23/19 0258 08/23/19 0258 08/24/19 0335 08/24/19 0335 08/25/19 1027 08/26/19 0621 08/27/19 0437  NA 135   < > 132*   < > 132*  --  133*  --  131* 131* 133*  K 3.8   < > 3.9   < > 4.2  --  4.3  --  4.4 5.0 4.9  CL 96*   < > 93*   < > 92*  --  91*  --  92* 91* 91*  CO2 26   < > 25   < > 24  --  23  --  22 21* 22  GLUCOSE 175*   < > 128*   < > 127*  --  120*  --  118* 136* 98  BUN 72*   < > 78*   < > 88*  --  91*  --  100* 109* 115*  CREATININE 3.09*   < > 3.71*   < > 4.21*  --  4.97*  --  6.25* 6.95* 7.54*  CALCIUM 8.8*   < > 8.7*   < > 8.7*   < > 9.1   < > 8.6* 9.1 9.0  MG 2.2  --  2.3  --  2.3  --  2.4  --   --   --   --    < > = values in this interval not displayed.    Liver Function Tests: No results for input(s): AST, ALT, ALKPHOS, BILITOT, PROT, ALBUMIN in the last 168 hours. No results for input(s): LIPASE, AMYLASE in the last 168 hours. No results for input(s): AMMONIA in the last 168 hours.  CBC: Recent Labs  Lab 08/23/19 0258 08/24/19 0335 08/25/19 0602 08/26/19 0621 08/27/19 0437  WBC 5.9 5.6 5.3 6.1 5.5  HGB 10.2* 10.6* 10.6* 10.9* 10.6*  HCT 31.6* 32.7* 31.8* 33.5* 32.2*  MCV 95.2 93.2 93.5 92.3 92.0  PLT 80* 67* 55* 69* 66*    Cardiac Enzymes: No results for input(s): CKTOTAL, CKMB, CKMBINDEX, TROPONINI in the last 168 hours.  BNP: Invalid input(s): POCBNP  CBG: No results for input(s): GLUCAP in the last 168 hours.  Microbiology: Results for orders placed or performed during the hospital encounter of 08/16/19  SARS CORONAVIRUS 2 (TAT 6-24 HRS) Nasopharyngeal Nasopharyngeal Swab     Status: None   Collection Time:  08/16/19  9:43 PM   Specimen: Nasopharyngeal Swab  Result Value Ref Range Status   SARS Coronavirus 2 NEGATIVE NEGATIVE Final    Comment: (NOTE) SARS-CoV-2 target nucleic acids are NOT DETECTED. The SARS-CoV-2 RNA is generally detectable in upper and lower respiratory specimens during the acute phase of infection. Negative results do not preclude SARS-CoV-2 infection, do not rule out co-infections with other pathogens, and should not be used as the sole basis for treatment or other patient management decisions. Negative results must be combined with clinical observations, patient history, and epidemiological information. The expected result is Negative. Fact Sheet for Patients: SugarRoll.be Fact Sheet for Healthcare Providers: https://www.woods-mathews.com/ This test is not yet approved or cleared by the [place] FDA and  has been authorized for detection and/or diagnosis of SARS-CoV-2 by FDA under an Emergency Use Authorization (EUA). This EUA will remain  in effect (meaning this test can be used) for the duration of the COVID-19 declaration under Section 56 4(b)(1) of the Act, 21 U.S.C. section 360bbb-3(b)(1), unless the authorization is terminated or revoked sooner. Performed at [facility], [place] [address], [place], [place] [address]     Coagulation Studies: No results for input(s): LABPROT, INR in the last 72 hours.  Urinalysis: No results for input(s): COLORURINE, LABSPEC, PHURINE, GLUCOSEU, HGBUR, BILIRUBINUR, KETONESUR, PROTEINUR, UROBILINOGEN, NITRITE, LEUKOCYTESUR in the last 72 hours.  Invalid input(s): APPERANCEUR    Imaging: [place] EKG SITE RITE  Result Date: 08/25/2019 If Site Rite image not attached, placement could not be confirmed due to current cardiac rhythm.    Medications:   . sodium chloride Stopped (08/19/19 1323)  . heparin 950 Units/hr (08/26/19 1302)   . allopurinol  50 mg Oral QODAY  .  amiodarone  200 mg Oral BID  . Chlorhexidine Gluconate Cloth  6 each Topical Daily  . Chlorhexidine Gluconate Cloth  6 each Topical Q0600  . docusate sodium  200 mg Oral Daily  . folic acid-pyridoxine-cyancobalamin  1 tablet Oral Daily  . midodrine  10 mg Oral TID WC  . multivitamin  1 tablet Oral QHS  . polyethylene glycol  17 g Oral Daily  . pramipexole  0.25 mg Oral BID  . rosuvastatin  10 mg Oral q1800  . sodium chloride flush  10-40 mL Intracatheter Q12H  . sodium chloride flush  3 mL Intravenous Q12H  . tamsulosin  0.4 mg Oral Daily  sodium chloride, acetaminophen, sodium chloride flush, sodium chloride flush  Assessment/ Plan:  1. Acute kidney Injury on chronic kidney disease stage IV: Hemodynamically mediated secondary to CHF exacerbation and unfortunately failed maximal medical therapy for which he was started on dialysis 08/24/2019.  next hemodialysis treatment 08/27/2019 (his third) and maintain him on a MWF schedule in the hospital as we await outpatient dialysis unit placement.  Evaluated yesterday by Dr. Scot Dock and the plan is to continue dialysis via [facility] given technical difficulties with getting permanent access. 2.  Acute exacerbation of congestive heart failure: Did not respond to inotropic support and diuretics and no changes to treatment response following cardioversion.  Clinically improving following dialysis.  Ejection fraction less than 20%. 3.  Anemia: Hemoglobin and hematocrit currently at goal: No overt losses.  Monitor for ESA. 4.  History of atrial fibrillation status post cardioversion: Currently on amiodarone and heparin drip-hopefully we can have vascular access placed before conversion to oral anticoagulation. 5.  DNR status.  Appropriate 6.  Chronic thrombocytopenia 7.  Hepatic cirrhosis    LOS: [place] @TODAY @8 :39 AM

## 2019-08-28 LAB — BASIC METABOLIC PANEL
Anion gap: 14 (ref 5–15)
BUN: 50 mg/dL — ABNORMAL HIGH (ref 8–23)
CO2: 23 mmol/L (ref 22–32)
Calcium: 8.3 mg/dL — ABNORMAL LOW (ref 8.9–10.3)
Chloride: 98 mmol/L (ref 98–111)
Creatinine, Ser: 4.71 mg/dL — ABNORMAL HIGH (ref 0.61–1.24)
GFR calc Af Amer: 12 mL/min — ABNORMAL LOW (ref >60)
GFR calc non Af Amer: 10 mL/min — ABNORMAL LOW (ref >60)
Glucose, Bld: 109 mg/dL — ABNORMAL HIGH (ref 70–99)
Potassium: 4.2 mmol/L (ref 3.5–5.1)
Sodium: 135 mmol/L (ref 135–145)

## 2019-08-28 LAB — CBC
HCT: 32.6 % — ABNORMAL LOW (ref 39.0–52.0)
Hemoglobin: 10.4 g/dL — ABNORMAL LOW (ref 13.0–17.0)
MCH: 30.2 pg (ref 26.0–34.0)
MCHC: 31.9 g/dL (ref 30.0–36.0)
MCV: 94.8 fL (ref 80.0–100.0)
Platelets: 30 10*3/uL — ABNORMAL LOW (ref 150–400)
RBC: 3.44 MIL/uL — ABNORMAL LOW (ref 4.22–5.81)
RDW: 17.1 % — ABNORMAL HIGH (ref 11.5–15.5)
WBC: 6.2 10*3/uL (ref 4.0–10.5)
nRBC: 0 % (ref 0.0–0.2)

## 2019-08-28 LAB — COOXEMETRY PANEL
Carboxyhemoglobin: 1.5 % (ref 0.5–1.5)
Methemoglobin: 1.2 % (ref 0.0–1.5)
O2 Saturation: 54.2 %
Total hemoglobin: 10.9 g/dL — ABNORMAL LOW (ref 12.0–16.0)

## 2019-08-28 LAB — HEPARIN LEVEL (UNFRACTIONATED): Heparin Unfractionated: 0.37 IU/mL (ref 0.30–0.70)

## 2019-08-28 MED ORDER — CHLORHEXIDINE GLUCONATE CLOTH 2 % EX PADS
6.0000 | MEDICATED_PAD | Freq: Every day | CUTANEOUS | Status: DC
  Administered 2019-08-28 – 2019-08-30 (×2): 6 via TOPICAL

## 2019-08-28 MED ORDER — APIXABAN 2.5 MG PO TABS
2.5000 mg | ORAL_TABLET | Freq: Two times a day (BID) | ORAL | Status: DC
  Administered 2019-08-28 – 2019-09-03 (×14): 2.5 mg via ORAL
  Filled 2019-08-28 (×15): qty 1

## 2019-08-28 NOTE — Progress Notes (Signed)
CARDIAC REHAB PHASE I   Went to offer to walk with pt. Pt states he recently finished with Pt. States it went ok, but his legs felt a little weak. Pt states dialysis is going ok, and weight continues to get better. Pt denies needs or concerns at this time. Will continue to follow as able.  [id number] Rufina Falco, RN BSN 08/28/2019 11:29 AM

## 2019-08-28 NOTE — Progress Notes (Signed)
[facility] ROUNDING NOTE   Subjective:   This is a 86 year old gentleman with ischemic cardiomyopathy St. Jude's ICD echo with a EF of less than 20% mildly dilated RV moderate decrease systolic function mild AI is mild to moderate MR severe TR.  He has congestive heart failure complicated by cardiorenal syndrome and recurrent atrial fibrillation with rapid ventricular response.  He had a poor response to diuretics and started on maintenance hemodialysis.  Midodrine 10 mg 3 times daily has been used in order to support blood pressure.  He has a tunneled dialysis cath and has been evaluated by Dr. [name] for placement of permanent access.  I agree with Dr. Scot Dock placement of a permanent access would not necessarily be indicated in view of the multiple comorbidities affecting this gentleman as well as technical difficulties in placement..  Blood pressure 129/67 pulse 674 temperature 97.8 O2 sats 98% room air  Last dialysis 08/27/2019 with 3 L removed  Sodium 133 potassium 4.9 chloride 91 CO2 22 BUN 115 creatinine 7.54 calcium 9.0 glucose 98 hemoglobin 10.6 platelets 66  Allopurinol 50 mg every other day amiodarone 200 mg twice daily, midodrine 10 mg 3 times daily multivitamins 1 daily, Mirapex 0.25 mg twice daily Crestor 10 mg daily Flomax 0.4 mg daily  IV heparin    Objective:  Vital signs in last 24 hours:  Temp:  [97.6 F (36.4 C)-98.1 F (36.7 C)] 97.8 F (36.6 C) (03/30 0759) Pulse Rate:  [60-80] 76 (03/30 0759) Resp:  [14-24] 16 (03/30 0759) BP: (93-129)/(47-97) 129/67 (03/30 0759) SpO2:  [97 %-100 %] 98 % (03/30 0759) Weight:  [99.3 kg-103.5 kg] 99.3 kg (03/30 0118)  Weight change: 2.076 kg Filed Weights   08/27/19 1230 08/27/19 1639 08/28/19 0118  Weight: 103.5 kg 99.7 kg 99.3 kg    Intake/Output: I/O last 3 completed shifts: In: 671 [P.O.:770; I.V.:209] Out: 3048 [Urine:50; IWPYK:9983; Stool:1]   Intake/Output this shift:  No  intake/output data recorded.  Gen: Resting comfortably in bed CVS: Pulse regular rhythm, normal rate, S1 and S2 normal Resp: Diminished breath sounds over bases, no rhonchi.  Right IJ TDC Abd: Soft, obese, nontender Ext: 1+ lower extremity edema   Basic Metabolic Panel: Recent Labs  Lab 08/22/19 0438 08/22/19 0438 08/23/19 0258 08/23/19 0258 08/24/19 0335 08/24/19 0335 08/25/19 1027 08/25/19 1027 08/26/19 0621 08/27/19 0437 08/28/19 0435  NA 132*   < > 132*   < > 133*  --  131*  --  131* 133* 135  K 3.9   < > 4.2   < > 4.3  --  4.4  --  5.0 4.9 4.2  CL 93*   < > 92*   < > 91*  --  92*  --  91* 91* 98  CO2 25   < > 24   < > 23  --  22  --  21* 22 23  GLUCOSE 128*   < > 127*   < > 120*  --  118*  --  136* 98 109*  BUN 78*   < > 88*   < > 91*  --  100*  --  109* 115* 50*  CREATININE 3.71*   < > 4.21*   < > 4.97*  --  6.25*  --  6.95* 7.54* 4.71*  CALCIUM 8.7*   < > 8.7*   < > 9.1   < > 8.6*   < > 9.1 9.0 8.3*  MG 2.3  --  2.3  --  2.4  --   --   --   --   --   --    < > = values in this interval not displayed.    Liver Function Tests: No results for input(s): AST, ALT, ALKPHOS, BILITOT, PROT, ALBUMIN in the last 168 hours. No results for input(s): LIPASE, AMYLASE in the last 168 hours. No results for input(s): AMMONIA in the last 168 hours.  CBC: Recent Labs  Lab 08/24/19 0335 08/25/19 0602 08/26/19 0621 08/27/19 0437 08/28/19 0500  WBC 5.6 5.3 6.1 5.5 6.2  HGB 10.6* 10.6* 10.9* 10.6* 10.4*  HCT 32.7* 31.8* 33.5* 32.2* 32.6*  MCV 93.2 93.5 92.3 92.0 94.8  PLT 67* 55* 69* 66* 30*    Cardiac Enzymes: No results for input(s): CKTOTAL, CKMB, CKMBINDEX, TROPONINI in the last 168 hours.  BNP: Invalid input(s): POCBNP  CBG: No results for input(s): GLUCAP in the last 168 hours.  Microbiology: Results for orders placed or performed during the hospital encounter of 08/16/19  SARS CORONAVIRUS 2 (TAT 6-24 HRS) Nasopharyngeal Nasopharyngeal Swab     Status: None    Collection Time: 08/16/19  9:43 PM   Specimen: Nasopharyngeal Swab  Result Value Ref Range Status   SARS Coronavirus 2 NEGATIVE NEGATIVE Final    Comment: (NOTE) SARS-CoV-2 target nucleic acids are NOT DETECTED. The SARS-CoV-2 RNA is generally detectable in upper and lower respiratory specimens during the acute phase of infection. Negative results do not preclude SARS-CoV-2 infection, do not rule out co-infections with other pathogens, and should not be used as the sole basis for treatment or other patient management decisions. Negative results must be combined with clinical observations, patient history, and epidemiological information. The expected result is Negative. Fact Sheet for Patients: SugarRoll.be Fact Sheet for Healthcare Providers: https://www.woods-mathews.com/ This test is not yet approved or cleared by the [place] FDA and  has been authorized for detection and/or diagnosis of SARS-CoV-2 by FDA under an Emergency Use Authorization (EUA). This EUA will remain  in effect (meaning this test can be used) for the duration of the COVID-19 declaration under Section 56 4(b)(1) of the Act, 21 U.S.C. section 360bbb-3(b)(1), unless the authorization is terminated or revoked sooner. Performed at [facility], [place] [address] [place], [place] [address]     Coagulation Studies: No results for input(s): LABPROT, INR in the last 72 hours.  Urinalysis: No results for input(s): COLORURINE, LABSPEC, PHURINE, GLUCOSEU, HGBUR, BILIRUBINUR, KETONESUR, PROTEINUR, UROBILINOGEN, NITRITE, LEUKOCYTESUR in the last 72 hours.  Invalid input(s): APPERANCEUR    Imaging: No results found.   Medications:   . sodium chloride Stopped (08/19/19 1323)  . heparin 950 Units/hr (08/27/19 1051)   . allopurinol  50 mg Oral QODAY  . amiodarone  200 mg Oral BID  . Chlorhexidine Gluconate Cloth  6 each Topical Daily  . Chlorhexidine Gluconate  Cloth  6 each Topical Q0600  . docusate sodium  200 mg Oral Daily  . folic acid-pyridoxine-cyancobalamin  1 tablet Oral Daily  . melatonin  6 mg Oral QHS  . midodrine  10 mg Oral TID WC  . multivitamin  1 tablet Oral QHS  . polyethylene glycol  17 g Oral Daily  . pramipexole  0.25 mg Oral BID  . rosuvastatin  10 mg Oral q1800  . sodium chloride flush  10-40 mL Intracatheter Q12H  . sodium chloride flush  3 mL Intravenous Q12H  . tamsulosin  0.4 mg Oral Daily   sodium chloride, acetaminophen, sodium chloride flush,  sodium chloride flush  Assessment/ Plan:  1. Acute kidney Injury on chronic kidney disease stage IV: Hemodynamically mediated secondary to CHF exacerbation and unfortunately failed maximal medical therapy for which he was started on dialysis 08/24/2019.  next hemodialysis treatment 08/27/2019 (his third) and maintain him on a MWF schedule in the hospital as we await outpatient dialysis unit placement.  Evaluated yesterday by Dr. Scot Dock and the plan is to continue dialysis via [facility] given technical difficulties with getting permanent access.  We will plan dialysis 08/29/2019 2.  Acute exacerbation of congestive heart failure: Did not respond to inotropic support and diuretics and no changes to treatment response following cardioversion.  Clinically improving following dialysis.  Ejection fraction less than 20%. 3.  Anemia: Hemoglobin and hematocrit currently at goal: No overt losses.  Monitor for ESA. 4.  History of atrial fibrillation status post cardioversion: Currently on amiodarone and heparin drip-hopefully we can have vascular access placed before conversion to oral anticoagulation. 5.  DNR status.  Appropriate 6.  Chronic thrombocytopenia 7.  Hepatic cirrhosis    LOS: [place] @TODAY @8 :46 AM

## 2019-08-28 NOTE — Progress Notes (Signed)
Physical Therapy Treatment Patient Details Name: Ricardo Payne MRN: [id number] DOB: 03/03/1931 Today's Date: 08/28/2019    History of Present Illness Patient is a 86 y/o male who presents with LE swelling and SOB. CXR-pulmonary edema. Admitted with Acute Decompensated Systolic HF. PMH includes CKD, ischemic heart disease, HTN, PAF, HLD, ICD, CAD s/p CABG, two recent admission for ADHF and volume overload (06/2019, 07/2019).    PT Comments    Patient is making progress toward PT goals. Pt tolerated gait training well with no AD. Pt on 3L O2 via [place]. Brief standing rest breaks needed due to SOB with mobility. Pt will continue to benefit from further skilled PT services to maximize independence and safety with mobility.     Follow Up Recommendations  Home health PT;Supervision for mobility/OOB     Equipment Recommendations  None recommended by PT    Recommendations for Other Services       Precautions / Restrictions Precautions Precautions: Fall Restrictions Weight Bearing Restrictions: No    Mobility  Bed Mobility Overal bed mobility: Modified Independent Bed Mobility: Supine to Sit           General bed mobility comments: use of rail and increased time  Transfers Overall transfer level: Needs assistance Equipment used: Rolling walker (2 wheeled);None Transfers: Sit to/from Stand Sit to Stand: Supervision         General transfer comment: supervision for safety  Ambulation/Gait Ambulation/Gait assistance: Min guard;Supervision Gait Distance (Feet): 260 Feet Assistive device: None Gait Pattern/deviations: Step-through pattern;Decreased stride length;Trunk flexed Gait velocity: decreased   General Gait Details: cues for upright posture; no LOB with horizontal head turns, turning, or directional changes; pt with SOB and 3 brief standing rest breaks taken    Stairs             Wheelchair Mobility    Modified Rankin (Stroke Patients Only)        Balance Overall balance assessment: Needs assistance Sitting-balance support: Feet supported;No upper extremity supported Sitting balance-Leahy Scale: Good     Standing balance support: During functional activity;No upper extremity supported Standing balance-Leahy Scale: Fair                              Cognition Arousal/Alertness: Awake/alert Behavior During Therapy: WFL for tasks assessed/performed Overall Cognitive Status: Within Functional Limits for tasks assessed                                        Exercises      General Comments        Pertinent Vitals/Pain Pain Assessment: No/denies pain    Home Living                      Prior Function            PT Goals (current goals can now be found in the care plan section) Progress towards PT goals: Progressing toward goals    Frequency    Min 3X/week      PT Plan Current plan remains appropriate    Co-evaluation              AM-PAC PT "6 Clicks" Mobility   Outcome Measure  Help needed turning from your back to your side while in a flat bed without using bedrails?: None Help needed moving from lying  on your back to sitting on the side of a flat bed without using bedrails?: None Help needed moving to and from a bed to a chair (including a wheelchair)?: A Little Help needed standing up from a chair using your arms (e.g., wheelchair or bedside chair)?: A Little Help needed to walk in hospital room?: A Little Help needed climbing 3-5 steps with a railing? : A Little 6 Click Score: 20    End of Session Equipment Utilized During Treatment: Gait belt Activity Tolerance: Patient tolerated treatment well Patient left: with call bell/phone within reach;in bed;Other (comment)(pt sitting EOB end of session) Nurse Communication: Mobility status PT Visit Diagnosis: Difficulty in walking, not elsewhere classified (R26.2)     Time: 8502-7741 PT Time Calculation (min)  (ACUTE ONLY): 21 min  Charges:  $Gait Training: 8-22 mins                     Earney Navy, PTA Acute Rehabilitation Services Pager: [phone] Office: [phone]     Darliss [place] 08/28/2019, 11:03 AM

## 2019-08-28 NOTE — Progress Notes (Signed)
Renal Navigator notes documentation of patient's need for OP HD treatment for AKI and confirmed with Dr. Webb/Nephrologist.  Renal Navigator met with patient who states he follows with Dr. Justin Mend at [facility]. He moved here "about 6 months ago" from [place] and states that his son is a good support. He plans to drive to OP HD treatment, but agrees with advice from Renal Navigator that a support person should plan on providing his transportation for his first few treatments. He requests first shift if possible, but understands this is based on clinic seat availability. Renal Navigator submitted OP HD referral to [facility] to request treatment at [facility] clinic for AKI, as [facility] is the closest clinic to patient's home.  Navigator will follow closely.  Alphonzo Cruise, [place] Renal Navigator  [phone]

## 2019-08-28 NOTE — Progress Notes (Addendum)
ANTICOAGULATION CONSULT NOTE - Follow Up Consult  Pharmacy Consult for Hold Eliquis > Heparin  Indication: atrial fibrillation  No Known Allergies  Patient Measurements: Height: 5' 6"  (167.6 cm) Weight: 218 lb 14.4 oz (99.3 kg) IBW/kg (Calculated) : 63.8 Heparin Dosing Weight: 85.7 kg  Vital Signs: Temp: 97.8 F (36.6 C) (03/30 0759) Temp Source: Oral (03/30 0759) BP: 129/67 (03/30 0759) Pulse Rate: 76 (03/30 0759)  Labs: Recent Labs    08/26/19 1025 08/26/19 8527 08/26/19 2017 08/26/19 2228 08/27/19 0437 08/28/19 0435 08/28/19 0500 08/28/19 0643  HGB 10.9*   < >  --   --  10.6*  --  10.4*  --   HCT 33.5*  --   --   --  32.2*  --  32.6*  --   PLT 69*  --   --   --  66*  --  30*  --   APTT 63*   < > >200* 78* 79*  --   --   --   HEPARINUNFRC 0.65  --   --   --  0.52  --   --  0.37  CREATININE 6.95*  --   --   --  7.54* 4.71*  --   --    < > = values in this interval not displayed.    Estimated Creatinine Clearance: 12.4 mL/min (A) (by C-G formula based on SCr of 4.71 mg/dL (H)).   Medical History: Past Medical History:  Diagnosis Date  . Acquired thrombophilia ([place])   . Age-related cataract of both eyes   . ASHD (arteriosclerotic heart disease)   . Benign prostatic hyperplasia   . CAD (coronary artery disease)    a. s/p CABG x1 in 1990  . Cerebellar stroke ([place]) 07/02/2016  . Chronic combined systolic and diastolic CHF (congestive heart failure) ([place])   . Chronic gout without tophus   . CKD (chronic kidney disease), stage IV ([place])   . CVA (cerebral vascular accident) ([place]) 2018  . Essential hypertension   . ICD (implantable cardioverter-defibrillator) in place   . Ischemic cardiomyopathy   . Mixed hyperlipidemia   . PAF (paroxysmal atrial fibrillation) ([place])   . Restless legs   . Secondary pulmonary arterial hypertension ([place])   . Thrombocytopenia ([place])   . Vitamin D deficiency     Assessment: 86 yo male on chronic apixaban for afib.  Pharmacy asked  to start IV heparin while apixaban on hold for HD catheter placement; last dose ~8 AM on 08/21/19. We will need to monitor anticoagulation based on aPTTs at this point, as heparin levels will be falsely elevated by apixaban.   VVS met with patient yesterday and decided to use temporary access for HD, with plans to decide permanent access in the future and was okay with resuming PTA Eliquis. Cardiology note states to continue heparin and restart PTA Eliquis when all procedures are finalized. Will follow up conversations/plans for procedures to know when able to resume PTA Eliquis.   Heparin level is therapeutic at 0.37. Hgb 10.4, plt down to 30 (will monitor closely). No s/sx of bleeding or infusion issues.  Goal of Therapy:  Heparin level 0.3-0.7 units/ml aPTT 66-102 seconds Monitor platelets by anticoagulation protocol: Yes   Plan:  Continue heparin at 950 units / hr Daily heparin level since aPTT now correlating with HL Daily CBC and monitor platelets closely  Monitor for bleeding  Follow up to resume PTA apixaban    Thank you,  Antonietta Jewel, PharmD, BCCCP Clinical  Pharmacist  Phone: [phone]  Please check AMION for all [place] phone numbers After 10:00 PM, call [place] [phone]  ADDENDUM Change to apixaban given no plans for permanent access. Will start apixaban 2.5 mg BID and stop heparin infusion. Monitor CBC, esp plt closely.   Antonietta Jewel, PharmD, [place] Clinical Pharmacist

## 2019-08-28 NOTE — Progress Notes (Addendum)
Patient ID: Ricardo Payne, male   DOB: 03-12-1931, 86 y.o.   MRN: [id number]     Advanced Heart Failure Rounding Note  PCP-Cardiologist: Donato Heinz, MD   Subjective:   Had iHD 3/25, 3/26. 3/29   Remains SOB with exertion. Having a hard time sleeping.    Objective:   Weight Range: 99.3 kg Body mass index is 35.33 kg/m.   Vital Signs:   Temp:  [97.6 F (36.4 C)-98.1 F (36.7 C)] 97.8 F (36.6 C) (03/30 0759) Pulse Rate:  [60-80] 76 (03/30 0759) Resp:  [14-24] 16 (03/30 0759) BP: (93-129)/(47-97) 129/67 (03/30 0759) SpO2:  [97 %-100 %] 98 % (03/30 0759) Weight:  [99.3 kg-103.5 kg] 99.3 kg (03/30 0118) Last BM Date: 08/27/19  Weight change: Filed Weights   08/27/19 1230 08/27/19 1639 08/28/19 0118  Weight: 103.5 kg 99.7 kg 99.3 kg    Intake/Output:   Intake/Output Summary (Last 24 hours) at 08/28/2019 1020 Last data filed at 08/28/2019 0752 Gross per 24 hour  Intake 410.99 ml  Output 2997 ml  Net -2586.01 ml      Physical Exam  CVP 24  General:   No resp difficulty HEENT: normal anicteric  Neck: supple. JVP to jaw . Carotids 2+ bilat; no bruits. No lymphadenopathy or thryomegaly appreciated. Cor: PMI nondisplaced. Regular rate & rhythm. No rubs, gallops or murmurs. R upper chest HD tunneled catheter.  Lungs: clear no wheeze Abdomen: soft, nontender, nondistended. No hepatosplenomegaly. No bruits or masses. Good bowel sounds. Extremities: no cyanosis, clubbing, rash, edema. RUE and LUE ecchymotic  Neuro: alert & oriented x 3, cranial nerves grossly intact. moves all 4 extremities w/o difficulty. Affect pleasant   Telemetry  SR 70-80s personally reviewed.    Labs    CBC Recent Labs    08/27/19 0437 08/28/19 0500  WBC 5.5 6.2  HGB 10.6* 10.4*  HCT 32.2* 32.6*  MCV 92.0 94.8  PLT 66* 30*   Basic Metabolic Panel Recent Labs    08/27/19 0437 08/28/19 0435  NA 133* 135  K 4.9 4.2  CL 91* 98  CO2 22 23  GLUCOSE 98 109*  BUN 115*  50*  CREATININE 7.54* 4.71*  CALCIUM 9.0 8.3*   Liver Function Tests No results for input(s): AST, ALT, ALKPHOS, BILITOT, PROT, ALBUMIN in the last 72 hours. No results for input(s): LIPASE, AMYLASE in the last 72 hours. Cardiac Enzymes No results for input(s): CKTOTAL, CKMB, CKMBINDEX, TROPONINI in the last 72 hours.  BNP: BNP (last 3 results) Recent Labs    08/13/19 1437 08/16/19 1659 08/17/19 0714  BNP 1,224.9* 1,081.1* 1,056.0*    ProBNP (last 3 results) Recent Labs    05/21/19 1535  PROBNP 6,191*     D-Dimer No results for input(s): DDIMER in the last 72 hours. Hemoglobin A1C No results for input(s): HGBA1C in the last 72 hours. Fasting Lipid Panel No results for input(s): CHOL, HDL, LDLCALC, TRIG, CHOLHDL, LDLDIRECT in the last 72 hours. Thyroid Function Tests No results for input(s): TSH, T4TOTAL, T3FREE, THYROIDAB in the last 72 hours.  Invalid input(s): FREET3  Other results:   Imaging    No results found.   Medications:     Scheduled Medications: . allopurinol  50 mg Oral QODAY  . amiodarone  200 mg Oral BID  . Chlorhexidine Gluconate Cloth  6 each Topical Daily  . Chlorhexidine Gluconate Cloth  6 each Topical Q0600  . Chlorhexidine Gluconate Cloth  6 each Topical Q0600  . docusate sodium  200 mg Oral Daily  . folic acid-pyridoxine-cyancobalamin  1 tablet Oral Daily  . melatonin  6 mg Oral QHS  . midodrine  10 mg Oral TID WC  . multivitamin  1 tablet Oral QHS  . polyethylene glycol  17 g Oral Daily  . pramipexole  0.25 mg Oral BID  . rosuvastatin  10 mg Oral q1800  . sodium chloride flush  10-40 mL Intracatheter Q12H  . sodium chloride flush  3 mL Intravenous Q12H  . tamsulosin  0.4 mg Oral Daily    Infusions: . sodium chloride Stopped (08/19/19 1323)  . heparin 950 Units/hr (08/27/19 1051)    PRN Medications: sodium chloride, acetaminophen, sodium chloride flush, sodium chloride flush   Assessment/Plan   1. Acute on chronic  systolic CHF: Ischemic cardiomyopathy.  St Jude ICD.  Echo in 2/21 with EF <20%, mildly dilated RV with moderately decreased systolic function, mild AS, mild-moderate MR, severe TR.  CHF is complicated by cardiorenal syndrome and recurrent atrial fibrillation with RVR.  Suspect low output HF.  He was on milrinone but now off.  -   Maintaining NSR after DCCV.  Started iHD 3/25. Tolerating so far.  - He is on midodrine 10 tid. 2. CAD: S/p remote CABG (1989).  He does not think he has had a coronary evaluation since that time.  No chest pain.  HS-TnI not elevated.  - NO chest pain.  - Continue Crestor.  - No ASA given anticoagulation.  3. Atrial fibrillation: Paroxysmal.  He was admitted with atrial fibrillation with RVR.  This may have triggered his CHF exacerbation (though based on device interrogation, worsening volume status does not seem to necessarily track with atrial fibrillation) or may be a consequence of the exacerbation.  TEE-guided DCCV done 3/24, maintaining NSR. - Continue amio 200 mg twice a day.  -Stop heparin drip. NO plan for other procedures.  Switch to eliquis 2.5 mg twice a day.  4. AKI on CKD stage 4:  - Started HD 3/25 - Tolerating iHD so far.  - Nephrology appreciated.  - Vascular wants to hold off on permanent access with multiple-co morbidities.  5. PAD: Patient reports history of peripheral intervention though I do not see this is in history.  6. CVA: In past. Likely related to atrial fibrillation.  7. Thrombocytopenia Plts on admit 75 K. Today down to 30. CBC in am.  - Stop heparin drip. Start eliquis today - No S/S bleeding      Length of Stay: 12  Amy Clegg, NP  08/28/2019, 10:20 AM  Advanced Heart Failure Team Pager [phone] (M-F; [place])  Please contact [facility] for night-coverage after hours (4p -7a ) and weekends on [email or web address]  Patient seen and examined with the above-signed Advanced Practice Provider and/or Housestaff. I personally reviewed  laboratory data, imaging studies and relevant notes. I independently examined the patient and formulated the important aspects of the plan. I have edited the note to reflect any of my changes or salient points. I have personally discussed the plan with the patient and/or family.  Still SOB with any exertion. + orthopnea despite HD yesterday. CVP remains quite high. Will have HD again today. BP stable on midodrine 10 tid. No dizziness. Maintaining NSR on amio 200 daily. No plans for permanent access yet. Will switch heparin to apixaban 2.5 bid. Discussed dosing with PharmD personally.  Platelets 30K today. No bleeding. Follow closely.   Exam as above (with my edits)  .Glori Bickers, MD  1:56 PM

## 2019-08-29 LAB — COOXEMETRY PANEL
Carboxyhemoglobin: 1.3 % (ref 0.5–1.5)
Methemoglobin: 1.1 % (ref 0.0–1.5)
O2 Saturation: 44.6 %
Total hemoglobin: 11.2 g/dL — ABNORMAL LOW (ref 12.0–16.0)

## 2019-08-29 LAB — CBC
HCT: 33.4 % — ABNORMAL LOW (ref 39.0–52.0)
Hemoglobin: 10.8 g/dL — ABNORMAL LOW (ref 13.0–17.0)
MCH: 30.3 pg (ref 26.0–34.0)
MCHC: 32.3 g/dL (ref 30.0–36.0)
MCV: 93.6 fL (ref 80.0–100.0)
Platelets: 35 10*3/uL — ABNORMAL LOW (ref 150–400)
RBC: 3.57 MIL/uL — ABNORMAL LOW (ref 4.22–5.81)
RDW: 17.2 % — ABNORMAL HIGH (ref 11.5–15.5)
WBC: 6.3 10*3/uL (ref 4.0–10.5)
nRBC: 0 % (ref 0.0–0.2)

## 2019-08-29 LAB — BASIC METABOLIC PANEL
Anion gap: 17 — ABNORMAL HIGH (ref 5–15)
BUN: 62 mg/dL — ABNORMAL HIGH (ref 8–23)
CO2: 24 mmol/L (ref 22–32)
Calcium: 9 mg/dL (ref 8.9–10.3)
Chloride: 93 mmol/L — ABNORMAL LOW (ref 98–111)
Creatinine, Ser: 5.72 mg/dL — ABNORMAL HIGH (ref 0.61–1.24)
GFR calc Af Amer: 10 mL/min — ABNORMAL LOW (ref >60)
GFR calc non Af Amer: 8 mL/min — ABNORMAL LOW (ref >60)
Glucose, Bld: 108 mg/dL — ABNORMAL HIGH (ref 70–99)
Potassium: 4.6 mmol/L (ref 3.5–5.1)
Sodium: 134 mmol/L — ABNORMAL LOW (ref 135–145)

## 2019-08-29 MED ORDER — HEPARIN SODIUM (PORCINE) 1000 UNIT/ML IJ SOLN
INTRAMUSCULAR | Status: AC
  Administered 2019-08-29: 1000 [IU]
  Filled 2019-08-29: qty 4

## 2019-08-29 NOTE — Procedures (Signed)
I have seen and examined this patient and agree with the plan of care.  Patient receiving his dialysis treatment without difficulty.  Temporary dialysis cath unable to place permanent access per Dr. [name].  Chronic comorbidities and technical difficulties in placement  Sherril Croon 08/29/2019, 8:54 AM

## 2019-08-29 NOTE — Progress Notes (Signed)
CARDIAC REHAB PHASE I   Offered to walk with pt. Pt states he is trying to rest after HD, but is tangled in the wires. Rearranged pts wires. Pt covered. Call bell within reach. Pt declines bedside table. Will continue to follow and encourage ambulation as able.  [place], RN BSN 08/29/2019 2:19 PM

## 2019-08-29 NOTE — Progress Notes (Signed)
[facility] ROUNDING NOTE   Subjective:   This is a 86 year old gentleman with ischemic cardiomyopathy St. Jude's ICD echo with a EF of less than 20% mildly dilated RV moderate decrease systolic function mild AI is mild to moderate MR severe TR.  He has congestive heart failure complicated by cardiorenal syndrome and recurrent atrial fibrillation with rapid ventricular response.  He had a poor response to diuretics and started on maintenance hemodialysis.  Midodrine 10 mg 3 times daily has been used in order to support blood pressure.  He has a tunneled dialysis cath and has been evaluated by Dr. [name] for placement of permanent access.  I agree with Dr. Scot Dock placement of a permanent access would not necessarily be indicated in view of the multiple comorbidities affecting this gentleman as well as technical difficulties in placement..  Blood pressure 107/86 pulse 78 temperature 97.7 O2 sats 90% 2 L nasal cannula  Last dialysis 08/27/2019 with 3 L removed  Sodium 134 potassium 4.6 chloride 93 CO2 24 BUN 60 creatinine 5.72 glucose 108 calcium 9 hemoglobin 10.8 platelets 35  Allopurinol 50 mg every other day amiodarone 200 mg twice daily, midodrine 10 mg 3 times daily multivitamins 1 daily, Mirapex 0.25 mg twice daily Crestor 10 mg daily Flomax 0.4 mg daily  IV heparin    Objective:  Vital signs in last 24 hours:  Temp:  [97.5 F (36.4 C)-97.7 F (36.5 C)] 97.7 F (36.5 C) (03/31 0718) Pulse Rate:  [69-78] 78 (03/31 0800) Resp:  [14-29] 17 (03/31 0800) BP: (90-124)/(54-86) 107/86 (03/31 0800) SpO2:  [98 %-100 %] 100 % (03/31 0718) Weight:  [100.7 kg-101.1 kg] 101.1 kg (03/31 0718)  Weight change: -2.756 kg Filed Weights   08/28/19 0118 08/29/19 0415 08/29/19 0718  Weight: 99.3 kg 100.7 kg 101.1 kg    Intake/Output: I/O last 3 completed shifts: In: 1217 [P.O.:1122; I.V.:95] Out: 25 [Urine:25]   Intake/Output this shift:  No intake/output data  recorded.  Gen: Resting comfortably in bed CVS: Pulse regular rhythm, normal rate, S1 and S2 normal Resp: Diminished breath sounds over bases, no rhonchi.  Right IJ TDC Abd: Soft, obese, nontender Ext: 1+ lower extremity edema   Basic Metabolic Panel: Recent Labs  Lab 08/23/19 0258 08/23/19 0258 08/24/19 0335 08/24/19 0335 08/25/19 1027 08/25/19 1027 08/26/19 9562 08/26/19 1308 08/27/19 0437 08/28/19 0435 08/29/19 0412  NA 132*   < > 133*   < > 131*  --  131*  --  133* 135 134*  K 4.2   < > 4.3   < > 4.4  --  5.0  --  4.9 4.2 4.6  CL 92*   < > 91*   < > 92*  --  91*  --  91* 98 93*  CO2 24   < > 23   < > 22  --  21*  --  22 23 24   GLUCOSE 127*   < > 120*   < > 118*  --  136*  --  98 109* 108*  BUN 88*   < > 91*   < > 100*  --  109*  --  115* 50* 62*  CREATININE 4.21*   < > 4.97*   < > 6.25*  --  6.95*  --  7.54* 4.71* 5.72*  CALCIUM 8.7*   < > 9.1   < > 8.6*   < > 9.1   < > 9.0 8.3* 9.0  MG 2.3  --  2.4  --   --   --   --   --   --   --   --    < > =  values in this interval not displayed.    Liver Function Tests: No results for input(s): AST, ALT, ALKPHOS, BILITOT, PROT, ALBUMIN in the last 168 hours. No results for input(s): LIPASE, AMYLASE in the last 168 hours. No results for input(s): AMMONIA in the last 168 hours.  CBC: Recent Labs  Lab 08/25/19 0602 08/26/19 0621 08/27/19 0437 08/28/19 0500 08/29/19 0412  WBC 5.3 6.1 5.5 6.2 6.3  HGB 10.6* 10.9* 10.6* 10.4* 10.8*  HCT 31.8* 33.5* 32.2* 32.6* 33.4*  MCV 93.5 92.3 92.0 94.8 93.6  PLT 55* 69* 66* 30* 35*    Cardiac Enzymes: No results for input(s): CKTOTAL, CKMB, CKMBINDEX, TROPONINI in the last 168 hours.  BNP: Invalid input(s): POCBNP  CBG: No results for input(s): GLUCAP in the last 168 hours.  Microbiology: Results for orders placed or performed during the hospital encounter of 08/16/19  SARS CORONAVIRUS 2 (TAT 6-24 HRS) Nasopharyngeal Nasopharyngeal Swab     Status: None   Collection Time:  08/16/19  9:43 PM   Specimen: Nasopharyngeal Swab  Result Value Ref Range Status   SARS Coronavirus 2 NEGATIVE NEGATIVE Final    Comment: (NOTE) SARS-CoV-2 target nucleic acids are NOT DETECTED. The SARS-CoV-2 RNA is generally detectable in upper and lower respiratory specimens during the acute phase of infection. Negative results do not preclude SARS-CoV-2 infection, do not rule out co-infections with other pathogens, and should not be used as the sole basis for treatment or other patient management decisions. Negative results must be combined with clinical observations, patient history, and epidemiological information. The expected result is Negative. Fact Sheet for Patients: SugarRoll.be Fact Sheet for Healthcare Providers: https://www.woods-mathews.com/ This test is not yet approved or cleared by the [place] FDA and  has been authorized for detection and/or diagnosis of SARS-CoV-2 by FDA under an Emergency Use Authorization (EUA). This EUA will remain  in effect (meaning this test can be used) for the duration of the COVID-19 declaration under Section 56 4(b)(1) of the Act, 21 U.S.C. section 360bbb-3(b)(1), unless the authorization is terminated or revoked sooner. Performed at [facility], [place] [address], [place],  [address]     Coagulation Studies: No results for input(s): LABPROT, INR in the last 72 hours.  Urinalysis: No results for input(s): COLORURINE, LABSPEC, PHURINE, GLUCOSEU, HGBUR, BILIRUBINUR, KETONESUR, PROTEINUR, UROBILINOGEN, NITRITE, LEUKOCYTESUR in the last 72 hours.  Invalid input(s): APPERANCEUR    Imaging: No results found.   Medications:   . sodium chloride Stopped (08/19/19 1323)   . allopurinol  50 mg Oral QODAY  . amiodarone  200 mg Oral BID  . apixaban  2.5 mg Oral BID  . Chlorhexidine Gluconate Cloth  6 each Topical Daily  . Chlorhexidine Gluconate Cloth  6 each Topical Q0600   . Chlorhexidine Gluconate Cloth  6 each Topical Q0600  . docusate sodium  200 mg Oral Daily  . folic acid-pyridoxine-cyancobalamin  1 tablet Oral Daily  . melatonin  6 mg Oral QHS  . midodrine  10 mg Oral TID WC  . multivitamin  1 tablet Oral QHS  . polyethylene glycol  17 g Oral Daily  . pramipexole  0.25 mg Oral BID  . rosuvastatin  10 mg Oral q1800  . sodium chloride flush  10-40 mL Intracatheter Q12H  . sodium chloride flush  3 mL Intravenous Q12H  . tamsulosin  0.4 mg Oral Daily   sodium chloride, acetaminophen, sodium chloride flush, sodium chloride flush  Assessment/ Plan:  1. Acute kidney Injury on chronic  kidney disease stage IV: Hemodynamically mediated secondary to CHF exacerbation and unfortunately failed maximal medical therapy for which he was started on dialysis 08/24/2019.  Marland Kitchen  Evaluated by Dr. Scot Dock and the plan is to continue dialysis via [facility] given technical difficulties with getting permanent access.  We will plan dialysis 08/29/2019 patient is currently receiving his dialysis treatment 2.  Acute exacerbation of congestive heart failure: Did not respond to inotropic support and diuretics and no changes to treatment response following cardioversion.  Clinically improving following dialysis.  Ejection fraction less than 20%. 3.  Anemia: Hemoglobin and hematocrit currently at goal: No overt losses.  Monitor for ESA. 4.  History of atrial fibrillation status post cardioversion: Currently on amiodarone and heparin drip-hopefully we can have vascular access placed before conversion to oral anticoagulation. 5.  DNR status.  Appropriate 6.  Chronic thrombocytopenia 7.  Hepatic cirrhosis    LOS: [place] @TODAY @8 :51 AM

## 2019-08-29 NOTE — Progress Notes (Signed)
Patient has been accepted for OP HD treatment at [facility] clinic on a MWF schedule with a seat time of 12:15pm. He needs to arrive 20 minutes early to his appointments. On his first day at the clinic, he needs to arrive at 11:15am to complete intake paperwork.  Navigator notified A. Clegg/PA and Dr. Webb/Nephrologist. Navigator will follow up with patient in the morning to provide him with verbal and written information regarding his clinic information and will follow for discharge in order to let clinic know when he will be starting and help him make a smooth transition from hospital to OP HD clinic.   Alphonzo Cruise, [place] [facility] Navigator [phone]

## 2019-08-29 NOTE — Progress Notes (Signed)
Physical Therapy Cancellation Note   08/29/19 0842  PT Visit Information  Last PT Received On 08/29/19  Reason Eval/Treat Not Completed Patient at procedure or test/unavailable. Pt off unit for HD. PT will continue to follow acutely.   Earney Navy, PTA Acute Rehabilitation Services Pager: [phone] Office: [phone]

## 2019-08-29 NOTE — Progress Notes (Addendum)
Patient ID: [facility], male   DOB: 03-29-1931, 86 y.o.   MRN: [id number]     Advanced Heart Failure Rounding Note  PCP-Cardiologist: Donato Heinz, MD   Subjective:   Started iHD 3/25  Had iHD today. Remains SOB with exertion.     Objective:   Weight Range: 98 kg Body mass index is 34.87 kg/m.   Vital Signs:   Temp:  [97.5 F (36.4 C)-97.9 F (36.6 C)] 97.9 F (36.6 C) (03/31 1200) Pulse Rate:  [68-78] 75 (03/31 1200) Resp:  [14-29] 18 (03/31 1200) BP: (90-124)/(54-86) 118/55 (03/31 1200) SpO2:  [98 %-100 %] 99 % (03/31 1200) Weight:  [98 kg-101.1 kg] 98 kg (03/31 1122) Last BM Date: 08/28/19  Weight change: Filed Weights   08/29/19 0415 08/29/19 0718 08/29/19 1122  Weight: 100.7 kg 101.1 kg 98 kg    Intake/Output:   Intake/Output Summary (Last 24 hours) at 08/29/2019 1204 Last data filed at 08/29/2019 1122 Gross per 24 hour  Intake 1002 ml  Output 3525 ml  Net -2523 ml      Physical Exam  CVP 28 General:  Sitting on the side of the bed.  No resp difficulty HEENT: normal Neck: supple. JVP to jaw . Carotids 2+ bilat; no bruits. No lymphadenopathy or thryomegaly appreciated. Cor: PMI nondisplaced. Regular rate & rhythm. No rubs, gallops or murmurs. Lungs: clear Abdomen: soft, nontender, nondistended. No hepatosplenomegaly. No bruits or masses. Good bowel sounds. Extremities: no cyanosis, clubbing, rash, R and LLE unna boots. R and LUE ecchymotic.  Neuro: alert & orientedx3, cranial nerves grossly intact. moves all 4 extremities w/o difficulty. Affect pleasant    Telemetry  SR 70-80s    Labs    CBC Recent Labs    08/28/19 0500 08/29/19 0412  WBC 6.2 6.3  HGB 10.4* 10.8*  HCT 32.6* 33.4*  MCV 94.8 93.6  PLT 30* 35*   Basic Metabolic Panel Recent Labs    08/28/19 0435 08/29/19 0412  NA 135 134*  K 4.2 4.6  CL 98 93*  CO2 23 24  GLUCOSE 109* 108*  BUN 50* 62*  CREATININE 4.71* 5.72*  CALCIUM 8.3* 9.0   Liver Function  Tests No results for input(s): AST, ALT, ALKPHOS, BILITOT, PROT, ALBUMIN in the last 72 hours. No results for input(s): LIPASE, AMYLASE in the last 72 hours. Cardiac Enzymes No results for input(s): CKTOTAL, CKMB, CKMBINDEX, TROPONINI in the last 72 hours.  BNP: BNP (last 3 results) Recent Labs    08/13/19 1437 08/16/19 1659 08/17/19 0714  BNP 1,224.9* 1,081.1* 1,056.0*    ProBNP (last 3 results) Recent Labs    05/21/19 1535  PROBNP 6,191*     D-Dimer No results for input(s): DDIMER in the last 72 hours. Hemoglobin A1C No results for input(s): HGBA1C in the last 72 hours. Fasting Lipid Panel No results for input(s): CHOL, HDL, LDLCALC, TRIG, CHOLHDL, LDLDIRECT in the last 72 hours. Thyroid Function Tests No results for input(s): TSH, T4TOTAL, T3FREE, THYROIDAB in the last 72 hours.  Invalid input(s): FREET3  Other results:   Imaging    No results found.   Medications:     Scheduled Medications: . allopurinol  50 mg Oral QODAY  . amiodarone  200 mg Oral BID  . apixaban  2.5 mg Oral BID  . Chlorhexidine Gluconate Cloth  6 each Topical Daily  . Chlorhexidine Gluconate Cloth  6 each Topical Q0600  . Chlorhexidine Gluconate Cloth  6 each Topical Q0600  . docusate sodium  200 mg Oral Daily  . folic acid-pyridoxine-cyancobalamin  1 tablet Oral Daily  . melatonin  6 mg Oral QHS  . midodrine  10 mg Oral TID WC  . multivitamin  1 tablet Oral QHS  . polyethylene glycol  17 g Oral Daily  . pramipexole  0.25 mg Oral BID  . rosuvastatin  10 mg Oral q1800  . sodium chloride flush  10-40 mL Intracatheter Q12H  . sodium chloride flush  3 mL Intravenous Q12H  . tamsulosin  0.4 mg Oral Daily    Infusions: . sodium chloride Stopped (08/19/19 1323)    PRN Medications: sodium chloride, acetaminophen, sodium chloride flush, sodium chloride flush   Assessment/Plan   1. Acute on chronic systolic CHF: Ischemic cardiomyopathy.  St Jude ICD.  Echo in 2/21 with EF  <20%, mildly dilated RV with moderately decreased systolic function, mild AS, mild-moderate MR, severe TR.  CHF is complicated by cardiorenal syndrome and recurrent atrial fibrillation with RVR.  Suspect low output HF.  He was on milrinone but now off.  -   Maintaining NSR after DCCV.  Started iHD 3/25. Had HF today. Weight down 6 pounds/ CVP 28.   - He is on midodrine 10 tid. 2. CAD: S/p remote CABG (1989).  He does not think he has had a coronary evaluation since that time.  No chest pain.  HS-TnI not elevated.  - No chest pain.   - Continue Crestor.  - No ASA given anticoagulation.  3. Atrial fibrillation: Paroxysmal.  He was admitted with atrial fibrillation with RVR.  This may have triggered his CHF exacerbation (though based on device interrogation, worsening volume status does not seem to necessarily track with atrial fibrillation) or may be a consequence of the exacerbation.  TEE-guided DCCV done 3/24, maintaining NSR.  - Continue amio po 200 mg twice a day.  - On eliquis 2.5 mg twice a day.  4. AKI on CKD stage 4:  - Started HD 3/25 - Tolerating iHD so far.  - Nephrology appreciated.  - Vascular wants to hold off on permanent access with multiple-co morbidities.  5. PAD: Patient reports history of peripheral intervention though I do not see this is in history.  6. CVA: In past. Likely related to atrial fibrillation.  7. Thrombocytopenia Plts on admit 75 K. PLTs up to 35.  Heparin stopped 08/28/19   - Continue eliquis today - No S/S bleeding  8. Deconditioning PT recommending HHPT.     Length of Stay: [place], NP  08/29/2019, 12:04 PM  Advanced Heart Failure Team Pager [phone] (M-F; 7a - 4p)  Please contact [facility] for night-coverage after hours (4p -7a ) and weekends on [email or web address]  Patient seen and examined with the above-signed Advanced Practice Provider and/or Housestaff. I personally reviewed laboratory data, imaging studies and relevant notes. I  independently examined the patient and formulated the important aspects of the plan. I have edited the note to reflect any of my changes or salient points. I have personally discussed the plan with the patient and/or family.  Tolerated HD again today. Weight down 6 pounds BP stable. CVP still 19-20. Still with some SOB and orthopnea. Remains in NSR. No bleeding on apixaban.   JVP to ear  RRR Lungs CTA Ab obese  Ext 2+ edema   Continue to pull fluid with HD. Watch BP.  Glori Bickers, MD  3:58 PM

## 2019-08-30 LAB — BASIC METABOLIC PANEL
Anion gap: 14 (ref 5–15)
BUN: 33 mg/dL — ABNORMAL HIGH (ref 8–23)
CO2: 25 mmol/L (ref 22–32)
Calcium: 8.9 mg/dL (ref 8.9–10.3)
Chloride: 99 mmol/L (ref 98–111)
Creatinine, Ser: 3.98 mg/dL — ABNORMAL HIGH (ref 0.61–1.24)
GFR calc Af Amer: 15 mL/min — ABNORMAL LOW (ref >60)
GFR calc non Af Amer: 13 mL/min — ABNORMAL LOW (ref >60)
Glucose, Bld: 119 mg/dL — ABNORMAL HIGH (ref 70–99)
Potassium: 4 mmol/L (ref 3.5–5.1)
Sodium: 138 mmol/L (ref 135–145)

## 2019-08-30 LAB — CBC
HCT: 33.4 % — ABNORMAL LOW (ref 39.0–52.0)
Hemoglobin: 10.6 g/dL — ABNORMAL LOW (ref 13.0–17.0)
MCH: 30.1 pg (ref 26.0–34.0)
MCHC: 31.7 g/dL (ref 30.0–36.0)
MCV: 94.9 fL (ref 80.0–100.0)
Platelets: 24 10*3/uL — CL (ref 150–400)
RBC: 3.52 MIL/uL — ABNORMAL LOW (ref 4.22–5.81)
RDW: 17.2 % — ABNORMAL HIGH (ref 11.5–15.5)
WBC: 6 10*3/uL (ref 4.0–10.5)
nRBC: 0 % (ref 0.0–0.2)

## 2019-08-30 LAB — COOXEMETRY PANEL
Carboxyhemoglobin: 1.2 % (ref 0.5–1.5)
Methemoglobin: 0.5 % (ref 0.0–1.5)
O2 Saturation: 58.5 %
Total hemoglobin: 10.6 g/dL — ABNORMAL LOW (ref 12.0–16.0)

## 2019-08-30 MED ORDER — CHLORHEXIDINE GLUCONATE CLOTH 2 % EX PADS
6.0000 | MEDICATED_PAD | Freq: Every day | CUTANEOUS | Status: DC
  Administered 2019-08-31: 07:00:00 6 via TOPICAL

## 2019-08-30 NOTE — Progress Notes (Signed)
[facility] R[facility]ING [facility]   Subjective:   This is a 86 year old gentleman with ischemic cardiomyopathy St. Jude's ICD echo with a EF of less than 20% mildly dilated RV moderate decrease systolic function mild AI is mild to moderate MR severe TR.  He has congestive heart failure complicated by cardiorenal syndrome and recurrent atrial fibrillation with rapid ventricular response.  He had a poor response to diuretics and started on maintenance hemodialysis.  Midodrine 10 mg 3 times daily has been used in order to support blood pressure.  He has a tunneled dialysis cath and has been evaluated by Dr. [name] for placement of permanent access.  I agree with Dr. Scot Dock placement of a permanent access would not necessarily be indicated in view of the multiple comorbidities affecting this gentleman as well as technical difficulties in placement..  Blood pressure 99/59 pulse 72 temperature 97.8  Last dialysis 08/29/2019 with 3.5 L removed  Sodium 138 potassium 4 chloride 109 CO2 25 BUN 33 creatinine 3.98 glucose 119 calcium 8.9 hemoglobin 10.6  Allopurinol 50 mg every other day amiodarone 200 mg twice daily, midodrine 10 mg 3 times daily multivitamins 1 daily, Mirapex 0.25 mg twice daily Crestor 10 mg daily Flomax 0.4 mg daily apixaban 2.5 mg twice daily      Objective:  Vital signs in last 24 hours:  Temp:  [97.6 F (36.4 C)-97.9 F (36.6 C)] 97.8 F (36.6 C) (04/01 0827) Pulse Rate:  [68-78] 72 (04/01 0827) Resp:  [15-20] 18 (04/01 0827) BP: (90-118)/(45-73) 99/59 (04/01 0827) SpO2:  [93 %-100 %] 94 % (04/01 0827) Weight:  [97.6 kg-98 kg] 97.6 kg (04/01 0059)  Weight change: 0.356 kg Filed Weights   08/29/19 0718 08/29/19 1122 08/30/19 0059  Weight: 101.1 kg 98 kg 97.6 kg    Intake/Output: I/O last 3 completed shifts: In: 7893 [P.O.:1284] Out: 8101 [Urine:325; Other:3500]   Intake/Output this shift:  Total I/O In: 420 [P.O.:420] Out: -   Gen: Resting  comfortably in bed CVS: Pulse regular rhythm, normal rate, S1 and S2 normal Resp: Diminished breath sounds over bases, no rhonchi.  Right IJ TDC Abd: Soft, obese, nontender Ext: 1+ lower extremity edema   Basic Metabolic Panel: Recent Labs  Lab 08/24/19 0335 08/25/19 1027 08/26/19 0621 08/26/19 0621 08/27/19 0437 08/27/19 0437 08/28/19 0435 08/29/19 0412 08/30/19 0409  NA 133*   < > 131*  --  133*  --  135 134* 138  K 4.3   < > 5.0  --  4.9  --  4.2 4.6 4.0  CL 91*   < > 91*  --  91*  --  98 93* 99  CO2 23   < > 21*  --  22  --  23 24 25   GLUCOSE 120*   < > 136*  --  98  --  109* 108* 119*  BUN 91*   < > 109*  --  115*  --  50* 62* 33*  CREATININE 4.97*   < > 6.95*  --  7.54*  --  4.71* 5.72* 3.98*  CALCIUM 9.1   < > 9.1   < > 9.0   < > 8.3* 9.0 8.9  MG 2.4  --   --   --   --   --   --   --   --    < > = values in this interval not displayed.    Liver Function Tests: No results for input(s): AST, ALT, ALKPHOS, BILITOT, PROT,  ALBUMIN in the last 168 hours. No results for input(s): LIPASE, AMYLASE in the last 168 hours. No results for input(s): AMMONIA in the last 168 hours.  CBC: Recent Labs  Lab 08/26/19 0621 08/27/19 0437 08/28/19 0500 08/29/19 0412 08/30/19 0409  WBC 6.1 5.5 6.2 6.3 6.0  HGB 10.9* 10.6* 10.4* 10.8* 10.6*  HCT 33.5* 32.2* 32.6* 33.4* 33.4*  MCV 92.3 92.0 94.8 93.6 94.9  PLT 69* 66* 30* 35* 24*    Cardiac Enzymes: No results for input(s): CKTOTAL, CKMB, CKMBINDEX, TROPONINI in the last 168 hours.  BNP: Invalid input(s): POCBNP  CBG: No results for input(s): GLUCAP in the last 168 hours.  Microbiology: Results for orders placed or performed during the hospital encounter of 08/16/19  SARS CORONAVIRUS 2 (TAT 6-24 HRS) Nasopharyngeal Nasopharyngeal Swab     Status: None   Collection Time: 08/16/19  9:43 PM   Specimen: Nasopharyngeal Swab  Result Value Ref Range Status   SARS Coronavirus 2 NEGATIVE NEGATIVE Final    Comment:  (NOTE) SARS-CoV-2 target nucleic acids are NOT DETECTED. The SARS-CoV-2 RNA is generally detectable in upper and lower respiratory specimens during the acute phase of infection. Negative results do not preclude SARS-CoV-2 infection, do not rule out co-infections with other pathogens, and should not be used as the sole basis for treatment or other patient management decisions. Negative results must be combined with clinical observations, patient history, and epidemiological information. The expected result is Negative. Fact Sheet for Patients: SugarRoll.be Fact Sheet for Healthcare Providers: https://www.woods-mathews.com/ This test is not yet approved or cleared by the [place] FDA and  has been authorized for detection and/or diagnosis of SARS-CoV-2 by FDA under an Emergency Use Authorization (EUA). This EUA will remain  in effect (meaning this test can be used) for the duration of the COVID-19 declaration under Section 56 4(b)(1) of the Act, 21 U.S.C. section 360bbb-3(b)(1), unless the authorization is terminated or revoked sooner. Performed at [facility], [place] [address] [place], [place] [address]     Coagulation Studies: No results for input(s): LABPROT, INR in the last 72 hours.  Urinalysis: No results for input(s): COLORURINE, LABSPEC, PHURINE, GLUCOSEU, HGBUR, BILIRUBINUR, KETONESUR, PROTEINUR, UROBILINOGEN, NITRITE, LEUKOCYTESUR in the last 72 hours.  Invalid input(s): APPERANCEUR    Imaging: No results found.   Medications:   . sodium chloride Stopped (08/19/19 1323)   . allopurinol  50 mg Oral QODAY  . amiodarone  200 mg Oral BID  . apixaban  2.5 mg Oral BID  . Chlorhexidine Gluconate Cloth  6 each Topical Daily  . Chlorhexidine Gluconate Cloth  6 each Topical Q0600  . Chlorhexidine Gluconate Cloth  6 each Topical Q0600  . docusate sodium  200 mg Oral Daily  . folic acid-pyridoxine-cyancobalamin  1 tablet  Oral Daily  . melatonin  6 mg Oral QHS  . midodrine  10 mg Oral TID WC  . multivitamin  1 tablet Oral QHS  . polyethylene glycol  17 g Oral Daily  . pramipexole  0.25 mg Oral BID  . rosuvastatin  10 mg Oral q1800  . sodium chloride flush  10-40 mL Intracatheter Q12H  . sodium chloride flush  3 mL Intravenous Q12H  . tamsulosin  0.4 mg Oral Daily   sodium chloride, acetaminophen, sodium chloride flush, sodium chloride flush  Assessment/ Plan:  1. Acute kidney Injury on chronic kidney disease stage IV: Hemodynamically mediated secondary to CHF exacerbation and unfortunately failed maximal medical therapy for which he was started  on dialysis 08/24/2019.  Marland Kitchen  Evaluated by Dr. Scot Dock and the plan is to continue dialysis via [facility] given technical difficulties with getting permanent access.  Next dialysis will be scheduled for 08/31/2019. 2.  Acute exacerbation of congestive heart failure: Did not respond to inotropic support and diuretics and no changes to treatment response following cardioversion.  Clinically improving following dialysis.  Ejection fraction less than 20%. 3.  Anemia: Hemoglobin and hematocrit currently at goal: No overt losses.  Monitor for ESA. 4.  History of atrial fibrillation status post cardioversion: Amiodarone and apixaban 5.  DNR status.  Appropriate 6.  Chronic thrombocytopenia 7.  Hepatic cirrhosis  Patient has a dialysis schedule Monday Wednesday Friday [facility].  From a renal standpoint he is okay to be discharged    LOS: [place] @TODAY @9 :03 AM

## 2019-08-30 NOTE — Progress Notes (Signed)
Physical Therapy Treatment Patient Details Name: Ricardo Payne MRN: [id number] DOB: 1930/10/17 Today's Date: 08/30/2019    History of Present Illness Patient is a 86 y/o male who presents with LE swelling and SOB. CXR-pulmonary edema. Admitted with Acute Decompensated Systolic HF. PMH includes CKD, ischemic heart disease, HTN, PAF, HLD, ICD, CAD s/p CABG, two recent admission for ADHF and volume overload (06/2019, 07/2019).    PT Comments    Patient continues to make progress toward PT goals and tolerated increased gait distance on RA. Pt will continue to benefit from further skilled PT services to maximize independence and safety with mobility.    Follow Up Recommendations  Home health PT;Supervision for mobility/OOB     Equipment Recommendations  None recommended by PT    Recommendations for Other Services       Precautions / Restrictions Precautions Precautions: Fall Precaution Comments: watch HR, A-fib Restrictions Weight Bearing Restrictions: No    Mobility  Bed Mobility Overal bed mobility: Independent Bed Mobility: Sit to Supine              Transfers Overall transfer level: Modified independent   Transfers: Sit to/from Stand              Ambulation/Gait Ambulation/Gait assistance: Supervision Gait Distance (Feet): 350 Feet Assistive device: None Gait Pattern/deviations: Step-through pattern;Decreased stride length     General Gait Details: improved posture and cadence this session; multiple standing rest breaks due to DOE; SpO2 >95% RA   Stairs             Wheelchair Mobility    Modified Rankin (Stroke Patients Only)       Balance Overall balance assessment: Needs assistance Sitting-balance support: Feet supported;No upper extremity supported Sitting balance-Leahy Scale: Good     Standing balance support: During functional activity;No upper extremity supported Standing balance-Leahy Scale: Fair                               Cognition Arousal/Alertness: Awake/alert Behavior During Therapy: WFL for tasks assessed/performed Overall Cognitive Status: Within Functional Limits for tasks assessed                                        Exercises      General Comments        Pertinent Vitals/Pain      Home Living                      Prior Function            PT Goals (current goals can now be found in the care plan section) Progress towards PT goals: Progressing toward goals    Frequency    Min 3X/week      PT Plan Current plan remains appropriate    Co-evaluation              AM-PAC PT "6 Clicks" Mobility   Outcome Measure  Help needed turning from your back to your side while in a flat bed without using bedrails?: None Help needed moving from lying on your back to sitting on the side of a flat bed without using bedrails?: None Help needed moving to and from a bed to a chair (including a wheelchair)?: A Little Help needed standing up from a chair using your arms (e.g., wheelchair or  bedside chair)?: A Little Help needed to walk in hospital room?: A Little Help needed climbing 3-5 steps with a railing? : A Little 6 Click Score: 20    End of Session Equipment Utilized During Treatment: Gait belt Activity Tolerance: Patient tolerated treatment well Patient left: with call bell/phone within reach;in bed Nurse Communication: Mobility status PT Visit Diagnosis: Difficulty in walking, not elsewhere classified (R26.2)     Time: 4129-0475 PT Time Calculation (min) (ACUTE ONLY): 42 min  Charges:  $Gait Training: 23-37 mins $Therapeutic Activity: 8-22 mins                     Earney Navy, PTA Acute Rehabilitation Services Pager: [phone] Office: [phone]     Darliss Cheney 08/30/2019, 3:39 PM

## 2019-08-30 NOTE — Progress Notes (Addendum)
CRITICAL VALUE ALERT  Critical Value:  Platelets 24  Date & Time Notied:  08/30/2019 0442  Provider Notified: Alveta Heimlich, MD  Orders Received/Actions taken: MD aware

## 2019-08-30 NOTE — Progress Notes (Addendum)
Patient ID: Ricardo Payne, male   DOB: [birth year], 86 y.o.   MRN: [id number]     Advanced Heart Failure Rounding Note  PCP-Cardiologist: Donato Heinz, MD   Subjective:    Feels much better today. Remains SOB with moderate exertion. Says his son will take him to HD.   Objective:   Weight Range: 97.6 kg Body mass index is 34.72 kg/m.   Vital Signs:   Temp:  [97.7 F (36.5 C)-97.8 F (36.6 C)] 97.8 F (36.6 C) (04/01 0827) Pulse Rate:  [70-78] 72 (04/01 0827) Resp:  [15-20] 18 (04/01 0827) BP: (96-112)/(45-73) 99/59 (04/01 0827) SpO2:  [93 %-100 %] 94 % (04/01 0827) Weight:  [97.6 kg] 97.6 kg (04/01 0059) Last BM Date: 08/29/19  Weight change: Filed Weights   08/29/19 0718 08/29/19 1122 08/30/19 0059  Weight: 101.1 kg 98 kg 97.6 kg    Intake/Output:   Intake/Output Summary (Last 24 hours) at 08/30/2019 1202 Last data filed at 08/30/2019 0852 Gross per 24 hour  Intake 1482 ml  Output 300 ml  Net 1182 ml      Physical Exam  CVP 26 General: Sitting in chair No resp difficulty HEENT: normal anicteric Neck: supple. JVP to ear  . Carotids 2+ bilat; no bruits. No lymphadenopathy or thryomegaly appreciated. Cor: PMI nondisplaced. Regular rate & rhythm. No rubs, gallops or murmurs. R subclavian HD cath Lungs: clear no wheeze Abdomen: obese soft, nontender, nondistended. No hepatosplenomegaly. No bruits or masses. Good bowel sounds. Extremities: no cyanosis, clubbing, rash, trace edema. R and LUE ecchymotic Neuro: alert & oriented x 3, cranial nerves grossly intact. moves all 4 extremities w/o difficulty. Affect pleasant  Telemetry  SR 70-80s  Personally reviewed   Labs    CBC Recent Labs    08/29/19 0412 08/30/19 0409  WBC 6.3 6.0  HGB 10.8* 10.6*  HCT 33.4* 33.4*  MCV 93.6 94.9  PLT 35* 24*   Basic Metabolic Panel Recent Labs    08/29/19 0412 08/30/19 0409  NA 134* 138  K 4.6 4.0  CL 93* 99  CO2 24 25  GLUCOSE 108* 119*  BUN 62* 33*   CREATININE 5.72* 3.98*  CALCIUM 9.0 8.9   Liver Function Tests No results for input(s): AST, ALT, ALKPHOS, BILITOT, PROT, ALBUMIN in the last 72 hours. No results for input(s): LIPASE, AMYLASE in the last 72 hours. Cardiac Enzymes No results for input(s): CKTOTAL, CKMB, CKMBINDEX, TROPONINI in the last 72 hours.  BNP: BNP (last 3 results) Recent Labs    08/13/19 1437 08/16/19 1659 08/17/19 0714  BNP 1,224.9* 1,081.1* 1,056.0*    ProBNP (last 3 results) Recent Labs    05/21/19 1535  PROBNP 6,191*     D-Dimer No results for input(s): DDIMER in the last 72 hours. Hemoglobin A1C No results for input(s): HGBA1C in the last 72 hours. Fasting Lipid Panel No results for input(s): CHOL, HDL, LDLCALC, TRIG, CHOLHDL, LDLDIRECT in the last 72 hours. Thyroid Function Tests No results for input(s): TSH, T4TOTAL, T3FREE, THYROIDAB in the last 72 hours.  Invalid input(s): FREET3  Other results:   Imaging    No results found.   Medications:     Scheduled Medications: . allopurinol  50 mg Oral QODAY  . amiodarone  200 mg Oral BID  . apixaban  2.5 mg Oral BID  . Chlorhexidine Gluconate Cloth  6 each Topical Daily  . Chlorhexidine Gluconate Cloth  6 each Topical Q0600  . Chlorhexidine Gluconate Cloth  6 each Topical  Q0347  . Chlorhexidine Gluconate Cloth  6 each Topical Q0600  . docusate sodium  200 mg Oral Daily  . folic acid-pyridoxine-cyancobalamin  1 tablet Oral Daily  . melatonin  6 mg Oral QHS  . midodrine  10 mg Oral TID WC  . multivitamin  1 tablet Oral QHS  . polyethylene glycol  17 g Oral Daily  . pramipexole  0.25 mg Oral BID  . rosuvastatin  10 mg Oral q1800  . sodium chloride flush  10-40 mL Intracatheter Q12H  . sodium chloride flush  3 mL Intravenous Q12H  . tamsulosin  0.4 mg Oral Daily    Infusions: . sodium chloride Stopped (08/19/19 1323)    PRN Medications: sodium chloride, acetaminophen, sodium chloride flush, sodium chloride flush    Assessment/Plan   1. Acute on chronic systolic CHF: Ischemic cardiomyopathy.  St Jude ICD.  Echo in 2/21 with EF <20%, mildly dilated RV with moderately decreased systolic function, mild AS, mild-moderate MR, severe TR.  CHF is complicated by cardiorenal syndrome and recurrent atrial fibrillation with RVR.  Suspect low output HF.  He was on milrinone but now off.  -   Maintaining NSR after DCCV.  Started iHD 3/25.  - He is on midodrine 10 tid. 2. CAD: S/p remote CABG (1989).  He does not think he has had a coronary evaluation since that time.  No chest pain.  HS-TnI not elevated.  - NO s/s ischemia  - Continue Crestor.  - No ASA given anticoagulation.  3. Atrial fibrillation: Paroxysmal.  He was admitted with atrial fibrillation with RVR.  This may have triggered his CHF exacerbation (though based on device interrogation, worsening volume status does not seem to necessarily track with atrial fibrillation) or may be a consequence of the exacerbation.  TEE-guided DCCV done 3/24, maintaining NSR.  - Continue amio po 200 mg twice a day.  - On eliquis 2.5 mg twice a day.  4. AKI on CKD stage 4:  - Started HD 3/25 - Tolerating iHD so far.  - Nephrology appreciated.  - Vascular wants to hold off on permanent access with multiple-co morbidities.  - Discussed with Renal Navigator. He has been accepted for OP HD treatment at [facility] on a MWF schedule with a seat time of 12:15pm. He needs to arrive 20 minutes early to his appointments. On his first day at the clinic, he needs to arrive at 11:15am to complete intake paperwork.  5. PAD: Patient reports history of peripheral intervention though I do not see this is in history.  6. CVA: In past. Likely related to atrial fibrillation.  7. Thrombocytopenia Plts on admit 75 K. PLTs trending down 35>24   Heparin stopped 08/28/19   - Continue eliquis today - No S/S bleeding  8. Deconditioning PT recommending HHPT. Referred to [facility]    Length of  Stay: [place], NP  08/30/2019, 12:02 PM  Advanced Heart Failure Team Pager [phone] (M-F; [place])  Please contact [facility] for night-coverage after hours (4p -7a ) and weekends on [email or web address]   Patient seen and examined with the above-signed Advanced Practice Provider and/or Housestaff. I personally reviewed laboratory data, imaging studies and relevant notes. I independently examined the patient and formulated the important aspects of the plan. I have edited the note to reflect any of my changes or salient points. I have personally discussed the plan with the patient and/or family.  Feels ok. Tolerated HD today. Still mild SOB. Weight down  1 pound overnight. However CVP 26. Plan for repeat HD tomorrow. PLTs 24K.   He is improving but CVP still very high on exam (measured personally). Will need further HD prior to d/c. Hopefully BP will tolerate  Glori Bickers, MD  3:54 PM

## 2019-08-31 DIAGNOSIS — N189 Chronic kidney disease, unspecified: Secondary | ICD-10-CM | POA: Diagnosis not present

## 2019-08-31 DIAGNOSIS — Z515 Encounter for palliative care: Secondary | ICD-10-CM | POA: Diagnosis not present

## 2019-08-31 LAB — RENAL FUNCTION PANEL
Albumin: 3.3 g/dL — ABNORMAL LOW (ref 3.5–5.0)
Anion gap: 12 (ref 5–15)
BUN: 49 mg/dL — ABNORMAL HIGH (ref 8–23)
CO2: 25 mmol/L (ref 22–32)
Calcium: 8.8 mg/dL — ABNORMAL LOW (ref 8.9–10.3)
Chloride: 98 mmol/L (ref 98–111)
Creatinine, Ser: 4.92 mg/dL — ABNORMAL HIGH (ref 0.61–1.24)
GFR calc Af Amer: 11 mL/min — ABNORMAL LOW (ref >60)
GFR calc non Af Amer: 10 mL/min — ABNORMAL LOW (ref >60)
Glucose, Bld: 112 mg/dL — ABNORMAL HIGH (ref 70–99)
Phosphorus: 6.2 mg/dL — ABNORMAL HIGH (ref 2.5–4.6)
Potassium: 4.6 mmol/L (ref 3.5–5.1)
Sodium: 135 mmol/L (ref 135–145)

## 2019-08-31 LAB — CBC
HCT: 32.8 % — ABNORMAL LOW (ref 39.0–52.0)
Hemoglobin: 10.3 g/dL — ABNORMAL LOW (ref 13.0–17.0)
MCH: 29.9 pg (ref 26.0–34.0)
MCHC: 31.4 g/dL (ref 30.0–36.0)
MCV: 95.1 fL (ref 80.0–100.0)
Platelets: 32 10*3/uL — ABNORMAL LOW (ref 150–400)
RBC: 3.45 MIL/uL — ABNORMAL LOW (ref 4.22–5.81)
RDW: 16.9 % — ABNORMAL HIGH (ref 11.5–15.5)
WBC: 5.5 10*3/uL (ref 4.0–10.5)
nRBC: 0 % (ref 0.0–0.2)

## 2019-08-31 LAB — COOXEMETRY PANEL
Carboxyhemoglobin: 1.5 % (ref 0.5–1.5)
Methemoglobin: 1.3 % (ref 0.0–1.5)
O2 Saturation: 50.5 %
Total hemoglobin: 10.7 g/dL — ABNORMAL LOW (ref 12.0–16.0)

## 2019-08-31 MED ORDER — ALTEPLASE 2 MG IJ SOLR: 2.0000 mg | Freq: Once | INTRAMUSCULAR | Status: DC | PRN

## 2019-08-31 MED ORDER — LIDOCAINE HCL (PF) 1 % IJ SOLN: 5.0000 mL | INTRAMUSCULAR | Status: DC | PRN

## 2019-08-31 MED ORDER — SODIUM CHLORIDE 0.9 % IV SOLN: 100.0000 mL | INTRAVENOUS | Status: DC | PRN

## 2019-08-31 MED ORDER — LIDOCAINE-PRILOCAINE 2.5-2.5 % EX CREA
1.0000 "application " | TOPICAL_CREAM | CUTANEOUS | Status: DC | PRN
  Filled 2019-08-31: qty 5

## 2019-08-31 MED ORDER — MIDODRINE HCL 5 MG PO TABS
15.0000 mg | ORAL_TABLET | ORAL | Status: DC
  Administered 2019-08-31: 18:00:00 15 mg via ORAL
  Filled 2019-08-31: qty 3

## 2019-08-31 MED ORDER — PENTAFLUOROPROP-TETRAFLUOROETH EX AERO: 1.0000 "application " | INHALATION_SPRAY | CUTANEOUS | Status: DC | PRN

## 2019-08-31 MED ORDER — SEVELAMER CARBONATE 800 MG PO TABS
2400.0000 mg | ORAL_TABLET | Freq: Three times a day (TID) | ORAL | Status: DC
  Administered 2019-08-31 – 2019-09-07 (×21): 2400 mg via ORAL
  Filled 2019-08-31 (×21): qty 3

## 2019-08-31 MED ORDER — MIDODRINE HCL 5 MG PO TABS
15.0000 mg | ORAL_TABLET | Freq: Three times a day (TID) | ORAL | Status: DC
  Administered 2019-09-01 – 2019-09-07 (×20): 15 mg via ORAL
  Filled 2019-08-31 (×20): qty 3

## 2019-08-31 MED ORDER — HEPARIN SODIUM (PORCINE) 1000 UNIT/ML DIALYSIS
1000.0000 [IU] | INTRAMUSCULAR | Status: DC | PRN
  Filled 2019-08-31: qty 1

## 2019-08-31 MED ORDER — MIDODRINE HCL 5 MG PO TABS: 10.0000 mg | ORAL_TABLET | ORAL | Status: DC

## 2019-08-31 NOTE — Progress Notes (Signed)
PT Cancellation Note  Patient Details Name: [name] MRN: [id number] DOB: 06/02/1930   Cancelled Treatment:    Reason Eval/Treat Not Completed: Patient at procedure or test/unavailable Pt off unit for HD. PT will continue to follow acutely.  Earney Navy, PTA Acute Rehabilitation Services Pager: [phone] Office: [phone]   08/31/2019, 8:48 AM

## 2019-08-31 NOTE — Progress Notes (Signed)
CARDIAC REHAB PHASE I   Offered to walk with pt. Pt recently returned from HS. Set-up pts lunch tray. Pt states he had a bad day at dialysis. Encouraged rest and ambulation later as able. Will continue to follow.  [place], RN BSN 08/31/2019 2:19 PM

## 2019-08-31 NOTE — Progress Notes (Signed)
CARDIAC REHAB PHASE I   Went to see pt. Pt off unit for Dialysis. Will f/u as able.  Rufina Falco, RN BSN 08/31/2019 8:52 AM

## 2019-08-31 NOTE — Progress Notes (Signed)
Daily Progress Note   Patient Name: Ricardo Payne       Date: 08/31/2019 DOB: [birth year]  Age: 86 y.o. MRN#: [id number] Attending Physician: Geralynn Rile, MD Primary Care Physician: [name], FNP Admit Date: 08/16/2019  Reason for Consultation/Follow-up: Establishing goals of care  Subjective/GOC: Patient awake, alert, oriented. Sitting up in bed eating lunch.   He had dialysis today. He reports feeling well during dialysis and denies symptoms.   He is hopeful to discharge home soon. Supportive son and DIL. Son plans to take Ricardo Payne to dialysis. Plan is for MWF. Discussed outpatient home health services with palliative care f/u. Patient agreeable. MOST form completed this admission.   Length of Stay: 15  Current Medications: Scheduled Meds:  . allopurinol  50 mg Oral QODAY  . amiodarone  200 mg Oral BID  . apixaban  2.5 mg Oral BID  . Chlorhexidine Gluconate Cloth  6 each Topical Q0600  . docusate sodium  200 mg Oral Daily  . folic acid-pyridoxine-cyancobalamin  1 tablet Oral Daily  . melatonin  6 mg Oral QHS  . midodrine  10 mg Oral TID WC  . multivitamin  1 tablet Oral QHS  . polyethylene glycol  17 g Oral Daily  . pramipexole  0.25 mg Oral BID  . rosuvastatin  10 mg Oral q1800  . sevelamer carbonate  2,400 mg Oral TID WC  . sodium chloride flush  10-40 mL Intracatheter Q12H  . sodium chloride flush  3 mL Intravenous Q12H  . tamsulosin  0.4 mg Oral Daily    Continuous Infusions: . sodium chloride Stopped (08/19/19 1323)  . sodium chloride    . sodium chloride      PRN Meds: sodium chloride, sodium chloride, sodium chloride, acetaminophen, alteplase, heparin, lidocaine (PF), lidocaine-prilocaine, pentafluoroprop-tetrafluoroeth, sodium chloride  flush, sodium chloride flush  Physical Exam Vitals and nursing note reviewed.  Constitutional:      General: He is awake.     Appearance: He is ill-appearing.  Cardiovascular:     Rate and Rhythm: Rhythm irregularly irregular.  Pulmonary:     Effort: No tachypnea, accessory muscle usage or respiratory distress.  Neurological:     Mental Status: He is alert and oriented to person, place, and time.  Vital Signs: BP (!) 102/56 (BP Location: Left Wrist)   Pulse 70   Temp 97.9 F (36.6 C) (Oral)   Resp (!) 22   Ht 5\' 6"  (1.676 m)   Wt 99.9 kg   SpO2 100%   BMI 35.54 kg/m  SpO2: SpO2: 100 % O2 Device: O2 Device: Nasal Cannula O2 Flow Rate: O2 Flow Rate (L/min): 3 L/min  Intake/output summary:   Intake/Output Summary (Last 24 hours) at 08/31/2019 1510 Last data filed at 08/31/2019 0805 Gross per 24 hour  Intake 1440 ml  Output 150 ml  Net 1290 ml   LBM: Last BM Date: 08/30/19 Baseline Weight: Weight: 100.2 kg Most recent weight: Weight: 99.9 kg       Palliative Assessment/Data: PPS 50%    Flowsheet Rows     Most Recent Value  Intake Tab  Referral Department  Cardiology  Unit at Time of Referral  Cardiac/Telemetry Unit  Palliative Care Primary Diagnosis  Cardiac  Date Notified  08/20/19  Palliative Care Type  Return patient Palliative Care  Reason for referral  Clarify Goals of Care  Date of Admission  08/16/19  Date first seen by Palliative Care  08/20/19  # of days Palliative referral response time  0 Day(s)  # of days IP prior to Palliative referral  4  Clinical Assessment  Palliative Performance Scale Score  50%  Psychosocial & Spiritual Assessment  Palliative Care Outcomes  Patient/Family meeting held?  Yes  Who was at the meeting?  patient and son  Palliative Care Outcomes  Clarified goals of care, Provided end of life care assistance, Provided psychosocial or spiritual support, Changed CPR status, Completed durable DNR, ACP counseling assistance        Patient Active Problem List   Diagnosis Date Noted  . Acute decompensated heart failure ([place]) 08/16/2019  . Heart failure with reduced ejection fraction ([place]) 07/19/2019  . Congestive heart failure ([place]) 07/18/2019  . Palliative care by specialist   . Goals of care, counseling/discussion   . Cirrhosis ([place])   . CKD (chronic kidney disease) 06/25/2019  . Head trauma 06/25/2019  . Blood thinned due to long-term anticoagulant use 06/25/2019  . Anemia 06/25/2019  . AKI (acute kidney injury) ([place]) 05/23/2019  . Chronic combined systolic and diastolic CHF (congestive heart failure) ([place]) 05/23/2019  . Esophageal hypertension 05/23/2019  . Thrombocytopenia ([place]) 05/23/2019  . Acute on chronic HFrEF (heart failure with reduced ejection fraction) ([place]) 05/22/2019  . ICD (implantable cardioverter-defibrillator) in place 04/18/2019    Palliative Care Assessment & Plan   Patient Profile: 86 y.o. male  with past medical history of chronic combined CHF, ischemic cardiomyopathy EF 20%, CAD s/p CABG, ICD, paroxysmal atrial fibrillation on Eliquis, HTN, HLD, CKD stage IV, multiple admissions for CHF exacerbations admitted on 08/16/2019 with worsening SOB and lower extremity edema. Heart failure team following. Patient with poor progression despite aggressive medical management with high dose lasix gtt, metolazone, milrinone, and amio gtt. Worsening creatinine. Nephrology consulted. Palliative medicine consultation for goals of care.   Assessment: Acute on chronic systolic CHF Ischemic cardiomyopathy EF <20% Atrial fibrillation with RVR CAD s/p CABG AKI on CKD stage 4, likely cardiorenal Hx CVA  Recommendations/Plan:  GOC discussion with patient and son 08/21/19.  MOST form completed. Patient's decisions include: DNR/DNI, otherwise FULL scope treatment, ABX if indicated, trial of IVF if indicated, NO feeding tube. Durable DNR completed. Copies placed in chart and given to son.   Continue  FULL SCOPE treatment including aggressive  medical management per heart failure team. Continue hemodialysis.  Outpatient palliative referral once discharged home.   Code Status: DNR/DNI   Code Status Orders  (From admission, onward)         Start     Ordered   08/16/19 2215  Full code  Continuous     08/16/19 2216        Code Status History    Date Active Date Inactive Code Status Order ID Comments User Context   07/18/2019 1744 07/20/2019 1654 Full Code [id number]  Asencion Noble, MD ED   07/06/2019 0830 07/12/2019 1801 Full Code [id number]  Ina Homes, MD ED   05/22/2019 1141 05/23/2019 1722 Full Code [id number]  Leanor Kail, [place] [facility]   Advance Care Planning Activity       Prognosis:   Poor long-term prognosis with end-stage heart failure EF <20%, cardiorenal syndrome  Discharge Planning:  Home with Home Health, outpatient palliative  Care plan was discussed with patient  Thank you for allowing the Palliative Medicine Team to assist in the care of this patient.   Time In: 1455- Time Out: 1510 Total Time 15 Prolonged Time Billed no      Greater than 50%  of this time was spent counseling and coordinating care related to the above assessment and plan.  Ihor Dow, DNP, FNP-C Palliative Medicine Team  Phone: [phone] Fax: [phone]  Please contact Palliative Medicine Team phone at [phone] for questions and concerns.

## 2019-08-31 NOTE — Progress Notes (Addendum)
Patient ID: Ricardo Payne, male   DOB: November 11, 1930, 86 y.o.   MRN: [id number]     Advanced Heart Failure Rounding Note  PCP-Cardiologist: Donato Heinz, MD   Subjective:    Less SOB today than yesterday. Had HD earlier this am but he reports session limited by hypotension.   Remains volume overloaded on exam. CVP ~25. Remains on supp O2.   Objective:   Weight Range: 99.9 kg Body mass index is 35.54 kg/m.   Vital Signs:   Temp:  [97.5 F (36.4 C)-98.3 F (36.8 C)] 97.9 F (36.6 C) (04/02 0804) Pulse Rate:  [66-74] 70 (04/02 0804) Resp:  [12-22] 22 (04/02 0804) BP: (100-112)/(52-68) 102/56 (04/02 0804) SpO2:  [99 %-100 %] 100 % (04/02 0804) Weight:  [99.9 kg] 99.9 kg (04/02 0131) Last BM Date: 08/30/19  Weight change: Filed Weights   08/29/19 1122 08/30/19 0059 08/31/19 0131  Weight: 98 kg 97.6 kg 99.9 kg    Intake/Output:   Intake/Output Summary (Last 24 hours) at 08/31/2019 1525 Last data filed at 08/31/2019 1522 Gross per 24 hour  Intake 1620 ml  Output 150 ml  Net 1470 ml      Physical Exam  CVP 25 General: chronically ill appearing elderly WM No resp difficulty HEENT: normal anicteric Neck: supple. Elevated JVP to ear. Carotids 2+ bilat; no bruits. No lymphadenopathy or thryomegaly appreciated. Cor: PMI nondisplaced. Regular rate & rhythm. No rubs, gallops or murmurs. R subclavian HD cath Lungs: CTAB no wheeze Abdomen: obese soft, nontender, nondistended. No hepatosplenomegaly. No bruits or masses. Good bowel sounds. Extremities: no cyanosis, clubbing, rash, no edema.  Neuro: alert & oriented x 3, cranial nerves grossly intact. moves all 4 extremities w/o difficulty. Affect pleasant  Telemetry   NSR 70s-80  Personally reviewed   Labs    CBC Recent Labs    08/30/19 0409 08/31/19 0812  WBC 6.0 5.5  HGB 10.6* 10.3*  HCT 33.4* 32.8*  MCV 94.9 95.1  PLT 24* 32*   Basic Metabolic Panel Recent Labs    08/30/19 0409 08/31/19 0513  NA  138 135  K 4.0 4.6  CL 99 98  CO2 25 25  GLUCOSE 119* 112*  BUN 33* 49*  CREATININE 3.98* 4.92*  CALCIUM 8.9 8.8*  PHOS  --  6.2*   Liver Function Tests Recent Labs    08/31/19 0513  ALBUMIN 3.3*   No results for input(s): LIPASE, AMYLASE in the last 72 hours. Cardiac Enzymes No results for input(s): CKTOTAL, CKMB, CKMBINDEX, TROPONINI in the last 72 hours.  BNP: BNP (last 3 results) Recent Labs    08/13/19 1437 08/16/19 1659 08/17/19 0714  BNP 1,224.9* 1,081.1* 1,056.0*    ProBNP (last 3 results) Recent Labs    05/21/19 1535  PROBNP 6,191*     D-Dimer No results for input(s): DDIMER in the last 72 hours. Hemoglobin A1C No results for input(s): HGBA1C in the last 72 hours. Fasting Lipid Panel No results for input(s): CHOL, HDL, LDLCALC, TRIG, CHOLHDL, LDLDIRECT in the last 72 hours. Thyroid Function Tests No results for input(s): TSH, T4TOTAL, T3FREE, THYROIDAB in the last 72 hours.  Invalid input(s): FREET3  Other results:   Imaging    No results found.   Medications:     Scheduled Medications: . allopurinol  50 mg Oral QODAY  . amiodarone  200 mg Oral BID  . apixaban  2.5 mg Oral BID  . Chlorhexidine Gluconate Cloth  6 each Topical Q0600  . docusate sodium  200 mg Oral Daily  . folic acid-pyridoxine-cyancobalamin  1 tablet Oral Daily  . melatonin  6 mg Oral QHS  . midodrine  10 mg Oral TID WC  . multivitamin  1 tablet Oral QHS  . polyethylene glycol  17 g Oral Daily  . pramipexole  0.25 mg Oral BID  . rosuvastatin  10 mg Oral q1800  . sevelamer carbonate  2,400 mg Oral TID WC  . sodium chloride flush  10-40 mL Intracatheter Q12H  . sodium chloride flush  3 mL Intravenous Q12H  . tamsulosin  0.4 mg Oral Daily    Infusions: . sodium chloride Stopped (08/19/19 1323)  . sodium chloride    . sodium chloride      PRN Medications: sodium chloride, sodium chloride, sodium chloride, acetaminophen, alteplase, heparin, lidocaine (PF),  lidocaine-prilocaine, pentafluoroprop-tetrafluoroeth, sodium chloride flush, sodium chloride flush   Assessment/Plan   1. Acute on chronic systolic CHF: Ischemic cardiomyopathy.  St Jude ICD.  Echo in 2/21 with EF <20%, mildly dilated RV with moderately decreased systolic function, mild AS, mild-moderate MR, severe TR.  CHF is complicated by cardiorenal syndrome and recurrent atrial fibrillation with RVR.  Suspect low output HF.  He was on milrinone but now off.  -   Maintaining NSR after DCCV.  Started iHD 3/25.  - He is on midodrine 10 tid. Will increase to 15 mg tid on HD days to allow more BP room for volume removal.  2. CAD: S/p remote CABG (1989).  He does not think he has had a coronary evaluation since that time.  No chest pain.  HS-TnI not elevated.  - NO s/s ischemia  - Continue Crestor.  - No ASA given anticoagulation.  3. Atrial fibrillation: Paroxysmal.  He was admitted with atrial fibrillation with RVR.  This may have triggered his CHF exacerbation (though based on device interrogation, worsening volume status does not seem to necessarily track with atrial fibrillation) or may be a consequence of the exacerbation.  TEE-guided DCCV done 3/24, maintaining NSR.  - Continue amio po 200 mg twice a day.  - On eliquis 2.5 mg twice a day.  4. AKI on CKD stage 4:  - Started HD 3/25 - Tolerating iHD so far. Volume removal limited some by soft BP. Will increase midodrine to 15 mg tid on HD days for added pressure support  - Nephrology appreciated.  - Vascular wants to hold off on permanent access with multiple-co morbidities.  - Discussed with Renal Navigator. He has been accepted for OP HD treatment at [facility] clinic on a MWF schedule with a seat time of 12:15pm. He needs to arrive 20 minutes early to his appointments. On his first day at the clinic, he needs to arrive at 11:15am to complete intake paperwork.  5. PAD: Patient reports history of peripheral intervention though I do not see  this is in history.  6. CVA: In past. Likely related to atrial fibrillation.  - continue on Eliquis for secondary prevention  7. Thrombocytopenia Plts on admit 75 K. PLTs trending down 35>24>>32  Heparin stopped 08/28/19   - Continue eliquis 2.5 bid  - No S/S bleeding  8. Deconditioning - PT recommending HHPT. Referred to [facility]   Length of Stay: [address], PA-C  08/31/2019, 3:25 PM  Advanced Heart Failure Team Pager [phone] (M-F; 7a - 4p)  Please contact [facility] for night-coverage after hours (4p -7a ) and weekends on [email or web address]

## 2019-08-31 NOTE — Progress Notes (Signed)
Ricardo Payne   Subjective:   This is a 86 year old gentleman with ischemic cardiomyopathy St. Jude's ICD echo with a EF of less than 20% mildly dilated RV moderate decrease systolic function mild AI is mild to moderate MR severe TR.  He has congestive heart failure complicated by cardiorenal syndrome and recurrent atrial fibrillation with rapid ventricular response.  He had a poor response to diuretics and started on maintenance hemodialysis.  Midodrine 10 mg 3 times daily has been used in order to support blood pressure.  He has a tunneled dialysis cath and has been evaluated by Dr. [name] for placement of permanent access.  I agree with Dr. Scot Dock placement of a permanent access would not necessarily be indicated in view of the multiple comorbidities affecting this gentleman as well as technical difficulties in placement..  Blood pressure 102/56 pulse 68 temperature 97.9 O2 sats 100% nasal cannula  Last dialysis 08/29/2019 with 3.5 L removed. Scheduled for dialysis 08/31/2019  Sodium 135 potassium 4.6 chloride 98 CO2 25 BUN 49 creatinine 4.92 glucose 112 phosphorus 6.2 albumin 3.3 calcium 8.8 hemoglobin 10.6 platelets 24  Allopurinol 50 mg every other day amiodarone 200 mg twice daily, midodrine 10 mg 3 times daily multivitamins 1 daily, Mirapex 0.25 mg twice daily Crestor 10 mg daily Flomax 0.4 mg daily apixaban 2.5 mg twice daily      Objective:  Vital signs in last 24 hours:  Temp:  [97.5 F (36.4 C)-98.3 F (36.8 C)] 97.9 F (36.6 C) (04/02 0804) Pulse Rate:  [66-74] 70 (04/02 0804) Resp:  [12-22] 22 (04/02 0804) BP: (100-120)/(52-68) 102/56 (04/02 0804) SpO2:  [99 %-100 %] 100 % (04/02 0804) Weight:  [99.9 kg] 99.9 kg (04/02 0131)  Weight change: -1.218 kg Filed Weights   08/29/19 1122 08/30/19 0059 08/31/19 0131  Weight: 98 kg 97.6 kg 99.9 kg    Intake/Output: I/O last 3 completed shifts: In: 2424 [P.O.:2424] Out: 150  [Urine:150]   Intake/Output this shift:  Total I/O In: 240 [P.O.:240] Out: -   Gen: Resting comfortably in bed CVS: Pulse regular rhythm, normal rate, S1 and S2 normal Resp: Diminished breath sounds over bases, no rhonchi.  Right IJ TDC Abd: Soft, obese, nontender Ext: 1+ lower extremity edema   Basic Metabolic Panel: Recent Labs  Lab 08/27/19 0437 08/27/19 0437 08/28/19 0435 08/28/19 0435 08/29/19 0412 08/30/19 0409 08/31/19 0513  NA 133*  --  135  --  134* 138 135  K 4.9  --  4.2  --  4.6 4.0 4.6  CL 91*  --  98  --  93* 99 98  CO2 22  --  23  --  24 25 25   GLUCOSE 98  --  109*  --  108* 119* 112*  BUN 115*  --  50*  --  62* 33* 49*  CREATININE 7.54*  --  4.71*  --  5.72* 3.98* 4.92*  CALCIUM 9.0   < > 8.3*   < > 9.0 8.9 8.8*  PHOS  --   --   --   --   --   --  6.2*   < > = values in this interval not displayed.    Liver Function Tests: Recent Labs  Lab 08/31/19 0513  ALBUMIN 3.3*   No results for input(s): LIPASE, AMYLASE in the last 168 hours. No results for input(s): AMMONIA in the last 168 hours.  CBC: Recent Labs  Lab 08/26/19 [phone] 08/27/19 0437 08/28/19 0500 08/29/19 5176  08/30/19 0409  WBC 6.1 5.5 6.2 6.3 6.0  HGB 10.9* 10.6* 10.4* 10.8* 10.6*  HCT 33.5* 32.2* 32.6* 33.4* 33.4*  MCV 92.3 92.0 94.8 93.6 94.9  PLT 69* 66* 30* 35* 24*    Cardiac Enzymes: No results for input(s): CKTOTAL, CKMB, CKMBINDEX, TROPONINI in the last 168 hours.  BNP: Invalid input(s): POCBNP  CBG: No results for input(s): GLUCAP in the last 168 hours.  Microbiology: Results for orders placed or performed during the hospital encounter of 08/16/19  SARS CORONAVIRUS 2 (TAT 6-24 HRS) Nasopharyngeal Nasopharyngeal Swab     Status: None   Collection Time: 08/16/19  9:43 PM   Specimen: Nasopharyngeal Swab  Result Value Ref Range Status   SARS Coronavirus 2 NEGATIVE NEGATIVE Final    Comment: (Payne) SARS-CoV-2 target nucleic acids are NOT DETECTED. The SARS-CoV-2  RNA is generally detectable in upper and lower respiratory specimens during the acute phase of infection. Negative results do not preclude SARS-CoV-2 infection, do not rule out co-infections with other pathogens, and should not be used as the sole basis for treatment or other patient management decisions. Negative results must be combined with clinical observations, patient history, and epidemiological information. The expected result is Negative. Fact Sheet for Patients: SugarRoll.be Fact Sheet for Healthcare Providers: https://www.woods-mathews.com/ This test is not yet approved or cleared by the [place] FDA and  has been authorized for detection and/or diagnosis of SARS-CoV-2 by FDA under an Emergency Use Authorization (EUA). This EUA will remain  in effect (meaning this test can be used) for the duration of the COVID-19 declaration under Section 56 4(b)(1) of the Act, 21 U.S.C. section 360bbb-3(b)(1), unless the authorization is terminated or revoked sooner. Performed at [facility], [place] [address], [place], [place] [address]     Coagulation Studies: No results for input(s): LABPROT, INR in the last 72 hours.  Urinalysis: No results for input(s): COLORURINE, LABSPEC, PHURINE, GLUCOSEU, HGBUR, BILIRUBINUR, KETONESUR, PROTEINUR, UROBILINOGEN, NITRITE, LEUKOCYTESUR in the last 72 hours.  Invalid input(s): APPERANCEUR    Imaging: No results found.   Medications:   . sodium chloride Stopped (08/19/19 1323)  . sodium chloride    . sodium chloride     . allopurinol  50 mg Oral QODAY  . amiodarone  200 mg Oral BID  . apixaban  2.5 mg Oral BID  . Chlorhexidine Gluconate Cloth  6 each Topical Q0600  . docusate sodium  200 mg Oral Daily  . folic acid-pyridoxine-cyancobalamin  1 tablet Oral Daily  . melatonin  6 mg Oral QHS  . midodrine  10 mg Oral TID WC  . multivitamin  1 tablet Oral QHS  . polyethylene glycol  17 g  Oral Daily  . pramipexole  0.25 mg Oral BID  . rosuvastatin  10 mg Oral q1800  . sodium chloride flush  10-40 mL Intracatheter Q12H  . sodium chloride flush  3 mL Intravenous Q12H  . tamsulosin  0.4 mg Oral Daily   sodium chloride, sodium chloride, sodium chloride, acetaminophen, alteplase, heparin, lidocaine (PF), lidocaine-prilocaine, pentafluoroprop-tetrafluoroeth, sodium chloride flush, sodium chloride flush  Assessment/ Plan:  1. Acute kidney Injury on chronic kidney disease stage IV: Hemodynamically mediated secondary to CHF exacerbation and unfortunately failed maximal medical therapy for which he was started on dialysis 08/24/2019.  Marland Kitchen  Evaluated by Dr. Scot Dock and the plan is to continue dialysis via [facility] given technical difficulties with getting permanent access.  Next dialysis will be scheduled for 08/31/2019. 2.  Acute exacerbation of  congestive heart failure: Did not respond to inotropic support and diuretics and no changes to treatment response following cardioversion.  Clinically improving following dialysis.  Ejection fraction less than 20%. 3.  Anemia: Hemoglobin and hematocrit currently at goal: No overt losses.  Monitor for ESA. 4.  History of atrial fibrillation status post cardioversion: Amiodarone and apixaban 5.  DNR status.  Appropriate 6.  Chronic thrombocytopenia 7.  Hepatic cirrhosis  8. Hypersphatemia we will add binder will start with Renvela 800 mg 3 with meals  Patient has a dialysis schedule Monday Wednesday Friday [facility] kidney center.  From a renal standpoint he is okay to be discharged    LOS: [place] @TODAY @9 :13 AM

## 2019-09-01 ENCOUNTER — Inpatient Hospital Stay (HOSPITAL_COMMUNITY): Payer: Medicare Other

## 2019-09-01 DIAGNOSIS — Z515 Encounter for palliative care: Secondary | ICD-10-CM | POA: Diagnosis not present

## 2019-09-01 DIAGNOSIS — Z7189 Other specified counseling: Secondary | ICD-10-CM | POA: Diagnosis not present

## 2019-09-01 LAB — RENAL FUNCTION PANEL
Albumin: 3.3 g/dL — ABNORMAL LOW (ref 3.5–5.0)
Anion gap: 12 (ref 5–15)
BUN: 28 mg/dL — ABNORMAL HIGH (ref 8–23)
CO2: 27 mmol/L (ref 22–32)
Calcium: 8.8 mg/dL — ABNORMAL LOW (ref 8.9–10.3)
Chloride: 97 mmol/L — ABNORMAL LOW (ref 98–111)
Creatinine, Ser: 3.41 mg/dL — ABNORMAL HIGH (ref 0.61–1.24)
GFR calc Af Amer: 18 mL/min — ABNORMAL LOW (ref >60)
GFR calc non Af Amer: 15 mL/min — ABNORMAL LOW (ref >60)
Glucose, Bld: 164 mg/dL — ABNORMAL HIGH (ref 70–99)
Phosphorus: 4.3 mg/dL (ref 2.5–4.6)
Potassium: 3.9 mmol/L (ref 3.5–5.1)
Sodium: 136 mmol/L (ref 135–145)

## 2019-09-01 LAB — CBC
HCT: 31.2 % — ABNORMAL LOW (ref 39.0–52.0)
Hemoglobin: 9.7 g/dL — ABNORMAL LOW (ref 13.0–17.0)
MCH: 29.8 pg (ref 26.0–34.0)
MCHC: 31.1 g/dL (ref 30.0–36.0)
MCV: 96 fL (ref 80.0–100.0)
Platelets: 30 10*3/uL — ABNORMAL LOW (ref 150–400)
RBC: 3.25 MIL/uL — ABNORMAL LOW (ref 4.22–5.81)
RDW: 16.8 % — ABNORMAL HIGH (ref 11.5–15.5)
WBC: 5.3 10*3/uL (ref 4.0–10.5)
nRBC: 0 % (ref 0.0–0.2)

## 2019-09-01 LAB — COOXEMETRY PANEL
Carboxyhemoglobin: 1.5 % (ref 0.5–1.5)
Methemoglobin: 1.2 % (ref 0.0–1.5)
O2 Saturation: 47.3 %
Total hemoglobin: 10.4 g/dL — ABNORMAL LOW (ref 12.0–16.0)

## 2019-09-01 MED ORDER — CHLORHEXIDINE GLUCONATE CLOTH 2 % EX PADS: 6.0000 | MEDICATED_PAD | Freq: Every day | CUTANEOUS | Status: DC

## 2019-09-01 MED ORDER — PENTAFLUOROPROP-TETRAFLUOROETH EX AERO: 1.0000 "application " | INHALATION_SPRAY | CUTANEOUS | Status: DC | PRN

## 2019-09-01 MED ORDER — LIDOCAINE HCL (PF) 1 % IJ SOLN: 5.0000 mL | INTRAMUSCULAR | Status: DC | PRN

## 2019-09-01 MED ORDER — HEPARIN SODIUM (PORCINE) 1000 UNIT/ML IJ SOLN
INTRAMUSCULAR | Status: AC
  Filled 2019-09-01: qty 4

## 2019-09-01 MED ORDER — ALTEPLASE 2 MG IJ SOLR: 2.0000 mg | Freq: Once | INTRAMUSCULAR | Status: DC | PRN

## 2019-09-01 MED ORDER — HYDROMORPHONE HCL 1 MG/ML IJ SOLN
0.5000 mg | Freq: Four times a day (QID) | INTRAMUSCULAR | Status: DC | PRN
  Administered 2019-09-04 – 2019-09-10 (×7): 0.5 mg via INTRAVENOUS
  Filled 2019-09-01 (×7): qty 0.5

## 2019-09-01 MED ORDER — HEPARIN SODIUM (PORCINE) 1000 UNIT/ML DIALYSIS: 1000.0000 [IU] | INTRAMUSCULAR | Status: DC | PRN

## 2019-09-01 MED ORDER — LIDOCAINE-PRILOCAINE 2.5-2.5 % EX CREA: 1.0000 "application " | TOPICAL_CREAM | CUTANEOUS | Status: DC | PRN

## 2019-09-01 MED ORDER — FUROSEMIDE 10 MG/ML IJ SOLN
160.0000 mg | Freq: Once | INTRAVENOUS | Status: AC
  Administered 2019-09-01: 11:00:00 160 mg via INTRAVENOUS
  Filled 2019-09-01: qty 16

## 2019-09-01 MED ORDER — SODIUM CHLORIDE 0.9 % IV SOLN: 100.0000 mL | INTRAVENOUS | Status: DC | PRN

## 2019-09-01 MED ORDER — CHLORHEXIDINE GLUCONATE CLOTH 2 % EX PADS
6.0000 | MEDICATED_PAD | Freq: Every day | CUTANEOUS | Status: DC
  Administered 2019-09-01: 6 via TOPICAL

## 2019-09-01 MED ORDER — HEPARIN SODIUM (PORCINE) 1000 UNIT/ML DIALYSIS
1000.0000 [IU] | INTRAMUSCULAR | Status: DC | PRN
  Administered 2019-09-01: 1000 [IU] via INTRAVENOUS_CENTRAL

## 2019-09-01 NOTE — Progress Notes (Addendum)
Ricardo Payne ROUNDING NOTE   Subjective:   This is a 86 year old gentleman with ischemic cardiomyopathy St. Jude's ICD echo with a EF of less than 20% mildly dilated RV moderate decrease systolic function mild AI is mild to moderate MR severe TR.  He has congestive heart failure complicated by cardiorenal syndrome and recurrent atrial fibrillation with rapid ventricular response.  He had a poor response to diuretics and started on maintenance hemodialysis.  Midodrine 10 mg 3 times daily has been used in order to support blood pressure.  He has a tunneled dialysis cath and has been evaluated by Dr. [name] for placement of permanent access.  I agree with Dr. Scot Dock placement of a permanent access would not necessarily be indicated in view of the multiple comorbidities affecting this gentleman as well as technical difficulties in placement..  Blood pressure 120/76 pulse 70 temperature 97.5 O2 sats 99% 2 L nasal cannula  Last dialysis 08/31/2019 removal of 1.5 L.  This is not documented in flowsheet but discussed with dialysis nurse  Sodium 136 potassium 3.9 chloride 97 CO2 27 BUN 28 creatinine 3.48 glucose 164 calcium 8.8 phosphorus 4.3 albumin 3.3 hemoglobin 9.7 platelets 30.  Allopurinol 50 mg every other day amiodarone 200 mg twice daily, midodrine 10 mg 3 times daily multivitamins 1 daily, Mirapex 0.25 mg twice daily Crestor 10 mg daily Flomax 0.4 mg daily apixaban 2.5 mg twice daily      Objective:  Vital signs in last 24 hours:  Temp:  [97.5 F (36.4 C)-98.1 F (36.7 C)] 97.5 F (36.4 C) (04/03 0733) Pulse Rate:  [67-91] 70 (04/03 0715) Resp:  [14-20] 14 (04/03 0715) BP: (97-125)/(59-100) 125/76 (04/03 0715) SpO2:  [96 %-100 %] 100 % (04/03 0715) Weight:  [98.4 kg-98.5 kg] 98.4 kg (04/03 0500)  Weight change: -1.406 kg Filed Weights   08/31/19 0131 09/01/19 0051 09/01/19 0500  Weight: 99.9 kg 98.5 kg 98.4 kg    Intake/Output: I/O last 3 completed  shifts: In: 1980 [P.O.:1980] Out: 250 [Urine:250]   Intake/Output this shift:  Total I/O In: 120 [P.O.:120] Out: -   Gen: Resting comfortably in bed CVS: Pulse regular rhythm, normal rate, S1 and S2 normal Resp: Diminished breath sounds over bases, no rhonchi.  Right IJ TDC Abd: Soft, obese, nontender Ext: 1+ lower extremity edema   Basic Metabolic Panel: Recent Labs  Lab 08/28/19 0435 08/28/19 0435 08/29/19 0412 08/29/19 0412 08/30/19 0409 08/31/19 0513 09/01/19 0500  NA 135  --  134*  --  138 135 136  K 4.2  --  4.6  --  4.0 4.6 3.9  CL 98  --  93*  --  99 98 97*  CO2 23  --  24  --  25 25 27   GLUCOSE 109*  --  108*  --  119* 112* 164*  BUN 50*  --  62*  --  33* 49* 28*  CREATININE 4.71*  --  5.72*  --  3.98* 4.92* 3.41*  CALCIUM 8.3*   < > 9.0   < > 8.9 8.8* 8.8*  PHOS  --   --   --   --   --  6.2* 4.3   < > = values in this interval not displayed.    Liver Function Tests: Recent Labs  Lab 08/31/19 0513 09/01/19 0500  ALBUMIN 3.3* 3.3*   No results for input(s): LIPASE, AMYLASE in the last 168 hours. No results for input(s): AMMONIA in the last 168 hours.  CBC:  Recent Labs  Lab 08/28/19 0500 08/29/19 0412 08/30/19 0409 08/31/19 0812 09/01/19 0500  WBC 6.2 6.3 6.0 5.5 5.3  HGB 10.4* 10.8* 10.6* 10.3* 9.7*  HCT 32.6* 33.4* 33.4* 32.8* 31.2*  MCV 94.8 93.6 94.9 95.1 96.0  PLT 30* 35* 24* 32* 30*    Cardiac Enzymes: No results for input(s): CKTOTAL, CKMB, CKMBINDEX, TROPONINI in the last 168 hours.  BNP: Invalid input(s): POCBNP  CBG: No results for input(s): GLUCAP in the last 168 hours.  Microbiology: Results for orders placed or performed during the hospital encounter of 08/16/19  SARS CORONAVIRUS 2 (TAT 6-24 HRS) Nasopharyngeal Nasopharyngeal Swab     Status: None   Collection Time: 08/16/19  9:43 PM   Specimen: Nasopharyngeal Swab  Result Value Ref Range Status   SARS Coronavirus 2 NEGATIVE NEGATIVE Final    Comment:  (NOTE) SARS-CoV-2 target nucleic acids are NOT DETECTED. The SARS-CoV-2 RNA is generally detectable in upper and lower respiratory specimens during the acute phase of infection. Negative results do not preclude SARS-CoV-2 infection, do not rule out co-infections with other pathogens, and should not be used as the sole basis for treatment or other patient management decisions. Negative results must be combined with clinical observations, patient history, and epidemiological information. The expected result is Negative. Fact Sheet for Patients: SugarRoll.be Fact Sheet for Healthcare Providers: https://www.woods-mathews.com/ This test is not yet approved or cleared by the [place] FDA and  has been authorized for detection and/or diagnosis of SARS-CoV-2 by FDA under an Emergency Use Authorization (EUA). This EUA will remain  in effect (meaning this test can be used) for the duration of the COVID-19 declaration under Section 56 4(b)(1) of the Act, 21 U.S.C. section 360bbb-3(b)(1), unless the authorization is terminated or revoked sooner. Performed at [facility], [place] [address] [place], [place] [address]     Coagulation Studies: No results for input(s): LABPROT, INR in the last 72 hours.  Urinalysis: No results for input(s): COLORURINE, LABSPEC, PHURINE, GLUCOSEU, HGBUR, BILIRUBINUR, KETONESUR, PROTEINUR, UROBILINOGEN, NITRITE, LEUKOCYTESUR in the last 72 hours.  Invalid input(s): APPERANCEUR    Imaging: No results found.   Medications:   . sodium chloride Stopped (08/19/19 1323)  . sodium chloride    . sodium chloride     . allopurinol  50 mg Oral QODAY  . amiodarone  200 mg Oral BID  . apixaban  2.5 mg Oral BID  . Chlorhexidine Gluconate Cloth  6 each Topical Q0600  . docusate sodium  200 mg Oral Daily  . folic acid-pyridoxine-cyancobalamin  1 tablet Oral Daily  . melatonin  6 mg Oral QHS  . midodrine  15 mg Oral  TID with meals  . multivitamin  1 tablet Oral QHS  . polyethylene glycol  17 g Oral Daily  . pramipexole  0.25 mg Oral BID  . rosuvastatin  10 mg Oral q1800  . sevelamer carbonate  2,400 mg Oral TID WC  . sodium chloride flush  10-40 mL Intracatheter Q12H  . sodium chloride flush  3 mL Intravenous Q12H  . tamsulosin  0.4 mg Oral Daily   sodium chloride, sodium chloride, sodium chloride, acetaminophen, alteplase, heparin, lidocaine (PF), lidocaine-prilocaine, pentafluoroprop-tetrafluoroeth, sodium chloride flush, sodium chloride flush  Assessment/ Plan:  1. Acute kidney Injury on chronic kidney disease stage IV: Hemodynamically mediated secondary to CHF exacerbation and unfortunately failed maximal medical therapy for which he was started on dialysis 08/24/2019.  Marland Kitchen  Evaluated by Dr. Scot Dock and the plan is to continue  dialysis via [facility] given technical difficulties with getting permanent access.  Discussed with cardiology.  We will plan dialysis for 09/01/2019 due to orthopnea.  Palliative medicine also involved.  Outcome does not appear to be good 2.  Acute exacerbation of congestive heart failure: Did not respond to inotropic support and diuretics and no changes to treatment response following cardioversion.  Clinically improving following dialysis.  Ejection fraction less than 20%. 3.  Anemia: Hemoglobin and hematocrit currently at goal: No overt losses.  Monitor for ESA. 4.  History of atrial fibrillation status post cardioversion: Amiodarone and apixaban 5.  DNR status.  Appropriate 6.  Chronic thrombocytopenia 7.  Hepatic cirrhosis  8. Hypersphatemia we will add binder will start with Renvela 800 mg 3 with meals  Patient has a dialysis schedule Monday Wednesday Friday [facility].     LOS: [place] @TODAY @9 :08 AM

## 2019-09-01 NOTE — Progress Notes (Addendum)
Daily Progress Note   Patient Name: Ricardo Payne       Date: 09/01/2019 DOB: 03-May-1931  Age: 86 y.o. MRN#: [id number] Attending Physician: Geralynn Rile, MD Primary Care Physician: [name], FNP Admit Date: 08/16/2019  Reason for Consultation/Follow-up: Establishing goals of care  Subjective/GOC:  1000: Received call from Dr. Haroldine Laws this AM requesting palliative provider f/u for Ricardo Payne. Patient is experiencing significant dyspnea and orthopnea this morning. Patient remains fluid overloaded with ineffective iHD due to hypotension. Dr. Haroldine Laws is recommending comfort focused care. Plan for today is lasix now and hemodialysis for symptom management.   Visited with Ricardo Payne at bedside. He is feeling slightly better now that he is sitting up in bed but understands he will receive lasix and hemodialysis today. Frankly and compassionately shared my concerns with his diagnoses and poor prognosis following my discussion with Dr. Haroldine Laws and recommendation for comfort. Patient states 'so that means hospice.' Patient is saddened to hear this. He shares that he would rather die knowing everything was tried and even die during dialysis before going to hospice. He shares his thoughts that he was feeling better and felt he had more time. Emotional support provided.   Discussed follow-up meeting with his son this afternoon and that Dr. Haroldine Laws could possibly join. Spoke with son, Dwayne via telephone to update on clinical condition this morning and recommendation for comfort measures. Dwayne will be at the hospital around 4pm. We plan to further discuss plan with his father.   Discussed with RN. Patient will go to HD at 1230. RN may give prn dilaudid for dyspnea/air hunger.     ADDENDUM 1645: F/u with patient, son (Dwayne) and DIL ([place]) at bedside. Discussed course of hospitalization including diagnoses, interventions, plan of care. Discussed episode of dyspnea/orthopnea this morning and Dr. Clayborne Dana recommendations for comfort/hospice approach to care with intolerance of hemodialysis and poor prognosis. Patient continues to share thoughts that he would rather die during dialysis, knowing everything possible was done over discharging with hospice.   Son would like to speak with Dr. Haroldine Laws and willing to visit hospital any time tomorrow. Spoke with Dr. Haroldine Laws. F/u meeting tomorrow, 4/4 at 10am with patient and family.   DIL does ask questions about outpatient resources and prognosis. Discussed outpatient palliative versus hospice options, explaining that  if dialysis is discontinued, hospice would provide the best support in the home in order to manage symptoms and prevent recurrent hospitalization.   Further discussions tomorrow. Family has PMT contact information.   Length of Stay: 16  Current Medications: Scheduled Meds:  . allopurinol  50 mg Oral QODAY  . amiodarone  200 mg Oral BID  . apixaban  2.5 mg Oral BID  . Chlorhexidine Gluconate Cloth  6 each Topical Q0600  . docusate sodium  200 mg Oral Daily  . folic acid-pyridoxine-cyancobalamin  1 tablet Oral Daily  . melatonin  6 mg Oral QHS  . midodrine  15 mg Oral TID with meals  . multivitamin  1 tablet Oral QHS  . polyethylene glycol  17 g Oral Daily  . pramipexole  0.25 mg Oral BID  . rosuvastatin  10 mg Oral q1800  . sevelamer carbonate  2,400 mg Oral TID WC  . sodium chloride flush  10-40 mL Intracatheter Q12H  . sodium chloride flush  3 mL Intravenous Q12H  . tamsulosin  0.4 mg Oral Daily    Continuous Infusions: . sodium chloride Stopped (08/19/19 1323)  . sodium chloride    . sodium chloride    . furosemide 160 mg (09/01/19 1043)    PRN Meds: sodium chloride, sodium chloride,  sodium chloride, acetaminophen, alteplase, heparin, HYDROmorphone (DILAUDID) injection, lidocaine (PF), lidocaine-prilocaine, pentafluoroprop-tetrafluoroeth, sodium chloride flush, sodium chloride flush  Physical Exam Vitals and nursing note reviewed.  Constitutional:      General: He is awake.     Appearance: He is ill-appearing.  Cardiovascular:     Rate and Rhythm: Rhythm irregularly irregular.  Pulmonary:     Effort: No tachypnea, accessory muscle usage or respiratory distress.  Neurological:     Mental Status: He is alert and oriented to person, place, and time.            Vital Signs: BP 125/76 (BP Location: Left Arm)   Pulse 70   Temp (!) 97.5 F (36.4 C) (Oral)   Resp 14   Ht 5\' 6"  (1.676 m)   Wt 98.4 kg   SpO2 100%   BMI 35.02 kg/m  SpO2: SpO2: 100 % O2 Device: O2 Device: Nasal Cannula O2 Flow Rate: O2 Flow Rate (L/min): 2 L/min  Intake/output summary:   Intake/Output Summary (Last 24 hours) at 09/01/2019 1115 Last data filed at 09/01/2019 0820 Gross per 24 hour  Intake 1020 ml  Output 100 ml  Net 920 ml   LBM: Last BM Date: 08/30/19 Baseline Weight: Weight: 100.2 kg Most recent weight: Weight: 98.4 kg       Palliative Assessment/Data: PPS 50%    Flowsheet Rows     Most Recent Value  Intake Tab  Referral Department  Cardiology  Unit at Time of Referral  Cardiac/Telemetry Unit  Palliative Care Primary Diagnosis  Cardiac  Date Notified  08/20/19  Palliative Care Type  Return patient Palliative Care  Reason for referral  Clarify Goals of Care  Date of Admission  08/16/19  Date first seen by Palliative Care  08/20/19  # of days Palliative referral response time  0 Day(s)  # of days IP prior to Palliative referral  4  Clinical Assessment  Palliative Performance Scale Score  50%  Psychosocial & Spiritual Assessment  Palliative Care Outcomes  Patient/Family meeting held?  Yes  Who was at the meeting?  patient and son  Palliative Care Outcomes  Clarified  goals of care, Provided end of life care  assistance, Provided psychosocial or spiritual support, Changed CPR status, Completed durable DNR, ACP counseling assistance      Patient Active Problem List   Diagnosis Date Noted  . Acute decompensated heart failure ([place]) 08/16/2019  . Heart failure with reduced ejection fraction ([place]) 07/19/2019  . Congestive heart failure ([place]) 07/18/2019  . Palliative care by specialist   . Goals of care, counseling/discussion   . Cirrhosis ([place])   . CKD (chronic kidney disease) 06/25/2019  . Head trauma 06/25/2019  . Blood thinned due to long-term anticoagulant use 06/25/2019  . Anemia 06/25/2019  . Acute renal failure superimposed on chronic kidney disease ([place]) 05/23/2019  . Chronic combined systolic and diastolic CHF (congestive heart failure) ([place]) 05/23/2019  . Esophageal hypertension 05/23/2019  . Thrombocytopenia ([place]) 05/23/2019  . Acute on chronic HFrEF (heart failure with reduced ejection fraction) ([place]) 05/22/2019  . ICD (implantable cardioverter-defibrillator) in place 04/18/2019    Palliative Care Assessment & Plan   Patient Profile: 86 y.o. male  with past medical history of chronic combined CHF, ischemic cardiomyopathy EF 20%, CAD s/p CABG, ICD, paroxysmal atrial fibrillation on Eliquis, HTN, HLD, CKD stage IV, multiple admissions for CHF exacerbations admitted on 08/16/2019 with worsening SOB and lower extremity edema. Heart failure team following. Patient with poor progression despite aggressive medical management with high dose lasix gtt, metolazone, milrinone, and amio gtt. Worsening creatinine. Nephrology consulted. Palliative medicine consultation for goals of care.   Assessment: Acute on chronic systolic CHF Ischemic cardiomyopathy EF <20% Atrial fibrillation with RVR CAD s/p CABG AKI on CKD stage 4, likely cardiorenal Hx CVA  Recommendations/Plan:  MOST form completed this admission. Patient's decisions include: DNR/DNI,  otherwise FULL scope treatment, ABX if indicated, trial of IVF if indicated, NO feeding tube. Durable DNR completed. Copies placed in chart and given to son.   Lasix per HF team today. Plan for dialysis this afternoon.   F/u GOC with patient/family and Dr. Haroldine Laws 4/4 at 10am.    Code Status: DNR/DNI   Code Status Orders  (From admission, onward)         Start     Ordered   08/16/19 2215  Full code  Continuous     08/16/19 2216        Code Status History    Date Active Date Inactive Code Status Order ID Comments User Context   07/18/2019 1744 07/20/2019 1654 Full Code [id number]  Asencion Noble, MD ED   07/06/2019 0830 07/12/2019 1801 Full Code [id number]  Ina Homes, MD ED   05/22/2019 1141 05/23/2019 1722 Full Code [id number]  Leanor Kail, [place]   Advance Care Planning Activity       Prognosis:   Poor prognosis with end-stage heart failure EF <20%, cardiorenal syndrome. Remains significantly fluid overloaded with poor tolerance to hemodialysis due to hypotension.   Discharge Planning:  To Be Determined  Care plan was discussed with patient, son (Dwayne), DIL, Dr. Haroldine Laws, RN  Thank you for allowing the Palliative Medicine Team to assist in the care of this patient.   Time In: 1000- 1645- Time Out: 1025 1715 Total Time 55 Prolonged Time Billed no      Greater than 50%  of this time was spent counseling and coordinating care related to the above assessment and plan.  Ihor Dow, DNP, FNP-C Palliative Medicine Team  Phone: [phone] Fax: [phone]  Please contact Palliative Medicine Team phone at [phone] for questions and concerns.

## 2019-09-01 NOTE — Progress Notes (Signed)
Patient ID: Ricardo Payne, male   DOB: 09-22-1930, 86 y.o.   MRN: [id number]     Advanced Heart Failure Rounding Note  PCP-Cardiologist: Donato Heinz, MD   Subjective:    Had HD yesterday but could only get 1.8L off. (Plan was for 2.5L) due to low BP  Sitting up on side of the bed saying, "I can't breathe". Profoundly orthopneic. CVP 33 on my check. Weight essentially unchanged over past 4 days.   Co-ox 47%   Objective:   Weight Range: 98.4 kg Body mass index is 35.02 kg/m.   Vital Signs:   Temp:  [97.5 F (36.4 C)-98.1 F (36.7 C)] 97.5 F (36.4 C) (04/03 0733) Pulse Rate:  [67-91] 70 (04/03 0715) Resp:  [14-20] 14 (04/03 0715) BP: (97-125)/(59-100) 125/76 (04/03 0715) SpO2:  [96 %-100 %] 100 % (04/03 0715) Weight:  [98.4 kg-98.5 kg] 98.4 kg (04/03 0500) Last BM Date: 08/30/19  Weight change: Filed Weights   08/31/19 0131 09/01/19 0051 09/01/19 0500  Weight: 99.9 kg 98.5 kg 98.4 kg    Intake/Output:   Intake/Output Summary (Last 24 hours) at 09/01/2019 0937 Last data filed at 09/01/2019 0820 Gross per 24 hour  Intake 1020 ml  Output 100 ml  Net 920 ml      Physical Exam  CVP 33 General: chronically ill sitting on side of bed saying he cant breathe HEENT: normal Neck: supple. JVP to ear Carotids 2+ bilat; no bruits. No lymphadenopathy or thryomegaly appreciated. Cor: PMI nondisplaced. Regular rate & rhythm. No rubs, gallops or murmurs. Lungs: minimal basilar crackles Abdomen: obese soft, nontender, markedly distended. No hepatosplenomegaly. No bruits or masses. Good bowel sounds. Extremities: no cyanosis, clubbing, rash, 2+ edema in thighs Neuro: alert & orientedx3, cranial nerves grossly intact. moves all 4 extremities w/o difficulty. Affect pleasant  Telemetry   NSR 70-80s  Personally reviewed   Labs    CBC Recent Labs    08/31/19 0812 09/01/19 0500  WBC 5.5 5.3  HGB 10.3* 9.7*  HCT 32.8* 31.2*  MCV 95.1 96.0  PLT 32* 30*    Basic Metabolic Panel Recent Labs    08/31/19 0513 09/01/19 0500  NA 135 136  K 4.6 3.9  CL 98 97*  CO2 25 27  GLUCOSE 112* 164*  BUN 49* 28*  CREATININE 4.92* 3.41*  CALCIUM 8.8* 8.8*  PHOS 6.2* 4.3   Liver Function Tests Recent Labs    08/31/19 0513 09/01/19 0500  ALBUMIN 3.3* 3.3*   No results for input(s): LIPASE, AMYLASE in the last 72 hours. Cardiac Enzymes No results for input(s): CKTOTAL, CKMB, CKMBINDEX, TROPONINI in the last 72 hours.  BNP: BNP (last 3 results) Recent Labs    08/13/19 1437 08/16/19 1659 08/17/19 0714  BNP 1,224.9* 1,081.1* 1,056.0*    ProBNP (last 3 results) Recent Labs    05/21/19 1535  PROBNP 6,191*     D-Dimer No results for input(s): DDIMER in the last 72 hours. Hemoglobin A1C No results for input(s): HGBA1C in the last 72 hours. Fasting Lipid Panel No results for input(s): CHOL, HDL, LDLCALC, TRIG, CHOLHDL, LDLDIRECT in the last 72 hours. Thyroid Function Tests No results for input(s): TSH, T4TOTAL, T3FREE, THYROIDAB in the last 72 hours.  Invalid input(s): FREET3  Other results:   Imaging    No results found.   Medications:     Scheduled Medications: . allopurinol  50 mg Oral QODAY  . amiodarone  200 mg Oral BID  . apixaban  2.5 mg  Oral BID  . Chlorhexidine Gluconate Cloth  6 each Topical Q0600  . docusate sodium  200 mg Oral Daily  . folic acid-pyridoxine-cyancobalamin  1 tablet Oral Daily  . melatonin  6 mg Oral QHS  . midodrine  15 mg Oral TID with meals  . multivitamin  1 tablet Oral QHS  . polyethylene glycol  17 g Oral Daily  . pramipexole  0.25 mg Oral BID  . rosuvastatin  10 mg Oral q1800  . sevelamer carbonate  2,400 mg Oral TID WC  . sodium chloride flush  10-40 mL Intracatheter Q12H  . sodium chloride flush  3 mL Intravenous Q12H  . tamsulosin  0.4 mg Oral Daily    Infusions: . sodium chloride Stopped (08/19/19 1323)  . sodium chloride    . sodium chloride    . furosemide       PRN Medications: sodium chloride, sodium chloride, sodium chloride, acetaminophen, alteplase, heparin, lidocaine (PF), lidocaine-prilocaine, pentafluoroprop-tetrafluoroeth, sodium chloride flush, sodium chloride flush   Assessment/Plan   1. Acute on chronic systolic CHF: Ischemic cardiomyopathy.  [employer] Jude ICD.  Echo in 2/21 with EF <20%, mildly dilated RV with moderately decreased systolic function, mild AS, mild-moderate MR, severe TR.  CHF is complicated by cardiorenal syndrome and recurrent atrial fibrillation with RVR.  Suspect low output HF.  He was on milrinone but now off.  - Maintaining NSR after DCCV.  - Started iHD 3/25.  - Markedly fluid overload with CVP 33. iHD ineffective at pulling fluid due to low BP despite midodrine 15 tid - Co-ox back down in shock range to 47% - With end-stage HF and inability to tolerate HD for volume removal I think he the only option left is to switch to comfort care. I discussed this with him this am. I have paged the Palliative care team.  - I will give 1 dose IV lasix 160 IV as he is still making some urine and may get a bit of relief from this with decrease in PVR - I reached out to Dr. Justin Mend to see if we could attempt another round of HD for comfort  2. CAD: S/p remote CABG (1989).  He does not think he has had a coronary evaluation since that time.  No chest pain.  HS-TnI not elevated.  - No s/s ischemia - Continue Crestor.  - No ASA given anticoagulation.  3. Atrial fibrillation: Paroxysmal.  He was admitted with atrial fibrillation with RVR.  This may have triggered his CHF exacerbation (though based on device interrogation, worsening volume status does not seem to necessarily track with atrial fibrillation) or may be a consequence of the exacerbation.  TEE-guided DCCV done 3/24,  - Still in NSR - Continue amio for now  - On eliquis 2.5 mg twice a day.  4. AKI on CKD stage 4-> ESRD:  - see above.  5. PAD: Patient reports history of  peripheral intervention though I do not see this is in history.  6. CVA: In past. Likely related to atrial fibrillation.  - continue on Eliquis for secondary prevention  7. Thrombocytopenia Plts on admit 75 K. PLTs trending down 35>24>>32 > 30k Heparin stopped 08/28/19   - Continue eliquis 2.5 bid  - No s/s bleeding   8. Deconditioning - PT recommending HHPT   Length of Stay: [place], MD  09/01/2019, 9:37 AM  Advanced Heart Failure Team Pager [phone] (M-F; 7a - 4p)  Please contact [facility] for night-coverage after hours (4p -  7a ) and weekends on [email or web address]

## 2019-09-02 DIAGNOSIS — Z7189 Other specified counseling: Secondary | ICD-10-CM | POA: Diagnosis not present

## 2019-09-02 DIAGNOSIS — Z515 Encounter for palliative care: Secondary | ICD-10-CM | POA: Diagnosis not present

## 2019-09-02 DIAGNOSIS — R0600 Dyspnea, unspecified: Secondary | ICD-10-CM

## 2019-09-02 LAB — CBC
HCT: 31.6 % — ABNORMAL LOW (ref 39.0–52.0)
Hemoglobin: 9.7 g/dL — ABNORMAL LOW (ref 13.0–17.0)
MCH: 29.8 pg (ref 26.0–34.0)
MCHC: 30.7 g/dL (ref 30.0–36.0)
MCV: 97.2 fL (ref 80.0–100.0)
Platelets: UNDETERMINED 10*3/uL (ref 150–400)
RBC: 3.25 MIL/uL — ABNORMAL LOW (ref 4.22–5.81)
RDW: 17 % — ABNORMAL HIGH (ref 11.5–15.5)
WBC: 5.9 10*3/uL (ref 4.0–10.5)
nRBC: 0 % (ref 0.0–0.2)

## 2019-09-02 LAB — COOXEMETRY PANEL
Carboxyhemoglobin: 1.4 % (ref 0.5–1.5)
Methemoglobin: 0.7 % (ref 0.0–1.5)
O2 Saturation: 50.4 %
Total hemoglobin: 10 g/dL — ABNORMAL LOW (ref 12.0–16.0)

## 2019-09-02 MED ORDER — CHLORHEXIDINE GLUCONATE CLOTH 2 % EX PADS
6.0000 | MEDICATED_PAD | Freq: Every day | CUTANEOUS | Status: DC
  Administered 2019-09-02 – 2019-09-03 (×2): 6 via TOPICAL

## 2019-09-02 MED ORDER — ANTICOAGULANT SODIUM CITRATE 4% (200MG/5ML) IV SOLN: 5.0000 mL | Status: DC | PRN

## 2019-09-02 NOTE — Progress Notes (Signed)
Daily Progress Note   Patient Name: Ricardo Payne       Date: 09/02/2019 DOB: [birth year]  Age: 86 y.o. MRN#: [id number] Attending Physician: Geralynn Rile, MD Primary Care Physician: [name], FNP Admit Date: 08/16/2019  Reason for Consultation/Follow-up: Establishing goals of care  Subjective: Patient awake, alert, oriented and able to participate in discussion. Intermittent dyspnea at rest. Sitting in recliner and reports that he feels better when sitting up in recliner.  GOC:  Greatly appreciate goals of care discussion with Dr. Haroldine Laws this morning. Son and DIL present.   Discussed in detail course of hospitalization including diagnoses, interventions, plan of care, and poor long-term prognosis.   Patient wishes to continue full scope treatment and again tells [place] he would rather die on dialysis than give up/start hospice. He does confirm DNR/DNI code status. Family agrees.  Plan is to push HD in the next 3 days (M,T,W) with hopes of getting CVP <15 with stable blood pressure. Will keep prn dilaudid on MAR for dyspnea/air hunger. Encouraged patient to ask for this medication if he feels he needs it for dyspnea. Also encouraged fluid restriction.   DIL does ask questions about home health versus home hospice and what this would look like. Explained hospice philosophy and options. Explained that regardless of which services he discharges with, he will need additional support from family or possibly caregivers. Son and DIL understand this. We also discussed emphasis on symptom management if he does not tolerate aggressive HD in the next few days and/or the plan is for discharge with hospice. Reassured of ongoing support from PMT this week with these tough decisions.   Son  has PMT contact information.    Length of Stay: 17  Current Medications: Scheduled Meds:  . allopurinol  50 mg Oral QODAY  . amiodarone  200 mg Oral BID  . apixaban  2.5 mg Oral BID  . Chlorhexidine Gluconate Cloth  6 each Topical Q0600  . Chlorhexidine Gluconate Cloth  6 each Topical Q0600  . docusate sodium  200 mg Oral Daily  . folic acid-pyridoxine-cyancobalamin  1 tablet Oral Daily  . melatonin  6 mg Oral QHS  . midodrine  15 mg Oral TID with meals  . multivitamin  1 tablet Oral QHS  . polyethylene glycol  17 g Oral  Daily  . pramipexole  0.25 mg Oral BID  . rosuvastatin  10 mg Oral q1800  . sevelamer carbonate  2,400 mg Oral TID WC  . sodium chloride flush  10-40 mL Intracatheter Q12H  . sodium chloride flush  3 mL Intravenous Q12H  . tamsulosin  0.4 mg Oral Daily    Continuous Infusions: . sodium chloride Stopped (08/19/19 1323)    PRN Meds: sodium chloride, acetaminophen, HYDROmorphone (DILAUDID) injection, sodium chloride flush, sodium chloride flush  Physical Exam Vitals and nursing note reviewed.  Constitutional:      General: He is awake.     Appearance: He is ill-appearing.  Cardiovascular:     Rate and Rhythm: Rhythm irregularly irregular.  Pulmonary:     Effort: No tachypnea, accessory muscle usage or respiratory distress.  Neurological:     Mental Status: He is alert and oriented to person, place, and time.            Vital Signs: BP (!) 102/56   Pulse 87   Temp (!) 97.5 F (36.4 C) (Oral)   Resp 17   Ht 5\' 6"  (1.676 m)   Wt 98 kg   SpO2 100%   BMI 34.88 kg/m  SpO2: SpO2: 100 % O2 Device: O2 Device: Nasal Cannula O2 Flow Rate: O2 Flow Rate (L/min): 2 L/min  Intake/output summary:   Intake/Output Summary (Last 24 hours) at 09/02/2019 0954 Last data filed at 09/02/2019 0901 Gross per 24 hour  Intake 660 ml  Output 2350 ml  Net -1690 ml   LBM: Last BM Date: 09/01/19 Baseline Weight: Weight: 100.2 kg Most recent weight: Weight: 98 kg        Palliative Assessment/Data: PPS 50%    Flowsheet Rows     Most Recent Value  Intake Tab  Referral Department  Cardiology  Unit at Time of Referral  Cardiac/Telemetry Unit  Palliative Care Primary Diagnosis  Cardiac  Date Notified  08/20/19  Palliative Care Type  Return patient Palliative Care  Reason for referral  Clarify Goals of Care  Date of Admission  08/16/19  Date first seen by Palliative Care  08/20/19  # of days Palliative referral response time  0 Day(s)  # of days IP prior to Palliative referral  4  Clinical Assessment  Palliative Performance Scale Score  50%  Psychosocial & Spiritual Assessment  Palliative Care Outcomes  Patient/Family meeting held?  Yes  Who was at the meeting?  patient and son  Palliative Care Outcomes  Clarified goals of care, Provided end of life care assistance, Provided psychosocial or spiritual support, Changed CPR status, Completed durable DNR, ACP counseling assistance      Patient Active Problem List   Diagnosis Date Noted  . Acute decompensated heart failure ([place]) 08/16/2019  . Heart failure with reduced ejection fraction ([place]) 07/19/2019  . Congestive heart failure ([place]) 07/18/2019  . Palliative care by specialist   . Goals of care, counseling/discussion   . Cirrhosis ([place])   . CKD (chronic kidney disease) 06/25/2019  . Head trauma 06/25/2019  . Blood thinned due to long-term anticoagulant use 06/25/2019  . Anemia 06/25/2019  . Acute renal failure superimposed on chronic kidney disease ([place]) 05/23/2019  . Chronic combined systolic and diastolic CHF (congestive heart failure) ([place]) 05/23/2019  . Esophageal hypertension 05/23/2019  . Thrombocytopenia ([place]) 05/23/2019  . Acute on chronic HFrEF (heart failure with reduced ejection fraction) ([place]) 05/22/2019  . ICD (implantable cardioverter-defibrillator) in place 04/18/2019    Palliative Care Assessment &  Plan   Patient Profile: 86 y.o. male  with past medical history of chronic  combined CHF, ischemic cardiomyopathy EF 20%, CAD s/p CABG, ICD, paroxysmal atrial fibrillation on Eliquis, HTN, HLD, CKD stage IV, multiple admissions for CHF exacerbations admitted on 08/16/2019 with worsening SOB and lower extremity edema. Heart failure team following. Patient with poor progression despite aggressive medical management with high dose lasix gtt, metolazone, milrinone, and amio gtt. Worsening creatinine. Nephrology consulted. Palliative medicine consultation for goals of care.   Assessment: Acute on chronic systolic CHF Ischemic cardiomyopathy EF <20% Atrial fibrillation with RVR CAD s/p CABG AKI on CKD stage 4, likely cardiorenal Hx CVA  Recommendations/Plan:  MOST form completed this admission. Patient's decisions include: DNR/DNI, otherwise FULL scope treatment, ABX if indicated, trial of IVF if indicated, NO feeding tube. Durable DNR completed. Copies placed in chart and given to son.   Appreciate GOC discussion with Dr. Haroldine Laws this morning.   Plan is for HD x3 days with hopes of CVP <15. Further discussions regarding comfort/hospice if this goal cannot be achieved.  PRN Dilaudid on MAR for dyspnea/air hunger  Code Status: DNR/DNI   Code Status Orders  (From admission, onward)         Start     Ordered   08/16/19 2215  Full code  Continuous     08/16/19 2216        Code Status History    Date Active Date Inactive Code Status Order ID Comments User Context   07/18/2019 1744 07/20/2019 1654 Full Code [id number]  Asencion Noble, MD ED   07/06/2019 0830 07/12/2019 1801 Full Code [id number]  Ina Homes, MD ED   05/22/2019 1141 05/23/2019 1722 Full Code [id number]  Leanor Kail, [place] [facility]   Advance Care Planning Activity       Prognosis:   Poor prognosis with end-stage heart failure EF <20%, cardiorenal syndrome. Remains significantly fluid overloaded with poor tolerance to hemodialysis due to hypotension.   Discharge Planning:  To Be  Determined  Care plan was discussed with patient, son (Dwayne), DIL, Dr. Haroldine Laws, RN  Thank you for allowing the Palliative Medicine Team to assist in the care of this patient.   Time In: 1000- 1140- Time Out: 1020 1200 Total Time 40 Prolonged Time Billed no      Greater than 50%  of this time was spent counseling and coordinating care related to the above assessment and plan.  Ihor Dow, DNP, FNP-C Palliative Medicine Team  Phone: [phone] Fax: [phone]  Please contact Palliative Medicine Team phone at [phone] for questions and concerns.

## 2019-09-02 NOTE — Progress Notes (Signed)
  Long talk with patient and his family with Palliative Care team present.   Discussed challenges of long-term HD in setting of HF with low output and the fact that it usually is limited by low BP and inability to dialyze effectively.   Plan will be to try to push HD over the next 3 days (M,T,W) and see how effectively he can be dialyzed. If can get CVP under 15 (it is 26-27) with stable BP, I would be more optimistic about outpatient HD (hopefully on 4 day/week schedule).   If unable to get CVP down then would reconsider shift to comfort care.  Plan for now 1) Continue HD with plan for HD next 3 days with goal of getting CVP < 15 2) Limit fluid intake 3) PRN Dilaudid for symptom management 4) DNR/DNI   Additional 40 minutes spent discussing.   Glori Bickers, MD  12:22 PM

## 2019-09-02 NOTE — Progress Notes (Signed)
Patient ID: Ricardo Payne, male   DOB: July 12, 1930, 86 y.o.   MRN: [id number]     Advanced Heart Failure Rounding Note  PCP-Cardiologist: Donato Heinz, MD   Subjective:    Yesterday CVP up to 33 and very orthopneic. Received lasix 160 IV with minimal response. Seen by Palliative Care and given dilaudid with good response. Underwent HD last night with about 2L off. Feels better today. Denies orthopnea or PND. Co-ox 47% yesterday now 50%   CVP 26  Says he wants to try everything he can and would rather die on dialysis machine than give up.    Objective:   Weight Range: 98 kg Body mass index is 34.88 kg/m.   Vital Signs:   Temp:  [97.5 F (36.4 C)-98 F (36.7 C)] 97.5 F (36.4 C) (04/04 0739) Pulse Rate:  [60-87] 87 (04/04 0739) Resp:  [17-26] 17 (04/04 0500) BP: (92-120)/(48-91) 102/56 (04/04 0500) SpO2:  [99 %-100 %] 100 % (04/04 0739) Weight:  [98 kg-101.3 kg] 98 kg (04/04 0500) Last BM Date: 09/01/19  Weight change: Filed Weights   09/01/19 1810 09/01/19 2115 09/02/19 0500  Weight: 101.3 kg 99.3 kg 98 kg    Intake/Output:   Intake/Output Summary (Last 24 hours) at 09/02/2019 1021 Last data filed at 09/02/2019 0901 Gross per 24 hour  Intake 660 ml  Output 2350 ml  Net -1690 ml      Physical Exam  CVP 26 General:  Sitting up in bed No resp difficulty HEENT: normal Neck: supple. JVP to ear Carotids 2+ bilat; no bruits. No lymphadenopathy or thryomegaly appreciated. Cor: PMI nondisplaced. Regular rate & rhythm. No rubs, gallops or murmurs. Lungs: clear Abdomen: obese soft, nontender, + distended. No hepatosplenomegaly. No bruits or masses. Good bowel sounds. Extremities: no cyanosis, clubbing, rash, 2+ edema in  highs Neuro: alert & orientedx3, cranial nerves grossly intact. moves all 4 extremities w/o difficulty. Affect pleasant  Telemetry   NSR 80s  Personally reviewed   Labs    CBC Recent Labs    09/01/19 0500 09/02/19 0452  WBC 5.3  5.9  HGB 9.7* 9.7*  HCT 31.2* 31.6*  MCV 96.0 97.2  PLT 30* PLATELET CLUMPS NOTED ON SMEAR, UNABLE TO ESTIMATE   Basic Metabolic Panel Recent Labs    08/31/19 0513 09/01/19 0500  NA 135 136  K 4.6 3.9  CL 98 97*  CO2 25 27  GLUCOSE 112* 164*  BUN 49* 28*  CREATININE 4.92* 3.41*  CALCIUM 8.8* 8.8*  PHOS 6.2* 4.3   Liver Function Tests Recent Labs    08/31/19 0513 09/01/19 0500  ALBUMIN 3.3* 3.3*   No results for input(s): LIPASE, AMYLASE in the last 72 hours. Cardiac Enzymes No results for input(s): CKTOTAL, CKMB, CKMBINDEX, TROPONINI in the last 72 hours.  BNP: BNP (last 3 results) Recent Labs    08/13/19 1437 08/16/19 1659 08/17/19 0714  BNP 1,224.9* 1,081.1* 1,056.0*    ProBNP (last 3 results) Recent Labs    05/21/19 1535  PROBNP 6,191*     D-Dimer No results for input(s): DDIMER in the last 72 hours. Hemoglobin A1C No results for input(s): HGBA1C in the last 72 hours. Fasting Lipid Panel No results for input(s): CHOL, HDL, LDLCALC, TRIG, CHOLHDL, LDLDIRECT in the last 72 hours. Thyroid Function Tests No results for input(s): TSH, T4TOTAL, T3FREE, THYROIDAB in the last 72 hours.  Invalid input(s): FREET3  Other results:   Imaging    No results found.   Medications:  Scheduled Medications: . allopurinol  50 mg Oral QODAY  . amiodarone  200 mg Oral BID  . apixaban  2.5 mg Oral BID  . Chlorhexidine Gluconate Cloth  6 each Topical Q0600  . Chlorhexidine Gluconate Cloth  6 each Topical Q0600  . docusate sodium  200 mg Oral Daily  . folic acid-pyridoxine-cyancobalamin  1 tablet Oral Daily  . melatonin  6 mg Oral QHS  . midodrine  15 mg Oral TID with meals  . multivitamin  1 tablet Oral QHS  . polyethylene glycol  17 g Oral Daily  . pramipexole  0.25 mg Oral BID  . rosuvastatin  10 mg Oral q1800  . sevelamer carbonate  2,400 mg Oral TID WC  . sodium chloride flush  10-40 mL Intracatheter Q12H  . sodium chloride flush  3 mL  Intravenous Q12H  . tamsulosin  0.4 mg Oral Daily    Infusions: . sodium chloride Stopped (08/19/19 1323)    PRN Medications: sodium chloride, acetaminophen, HYDROmorphone (DILAUDID) injection, sodium chloride flush, sodium chloride flush   Assessment/Plan   1. Acute on chronic systolic CHF: Ischemic cardiomyopathy.  St Jude ICD.  Echo in 2/21 with EF <20%, mildly dilated RV with moderately decreased systolic function, mild AS, mild-moderate MR, severe TR.  CHF is complicated by cardiorenal syndrome and recurrent atrial fibrillation with RVR.  Suspect low output HF.  He was on milrinone but now off.  - Maintaining NSR after DCCV.  - Started iHD 3/25.  - Remains markedly volume overloaded with CVP 26 despite several weeks of HD - Co-ox marginal at 50% - He has end-stage systolic HF with marked volume overload and recent development of ESRD. Co-ox remains. CVP remains consistently > 20 despite several weeks of HD. Volume removal has been limited by low BP despite midodrine 15 tid - Patient is adamant he does not want to give up but I explained to him that his situation is likely to progress and will become increasingly intolerant to volume removal - I d/w Dr. Justin Mend today and will plan HD for 3 days in a row this week (M,Tu, W) to see if we can get CVP < 15 with consistent HD. If not, I think he will have to reconsider his options - Family meeting with son and Palliative Care today  2. CAD: S/p remote CABG (1989).  He does not think he has had a coronary evaluation since that time.  No chest pain.  HS-TnI not elevated.  - No s/s ischemia - Continue Crestor.  - No ASA given anticoagulation.   3. Atrial fibrillation: Paroxysmal.  He was admitted with atrial fibrillation with RVR.  This may have triggered his CHF exacerbation (though based on device interrogation, worsening volume status does not seem to necessarily track with atrial fibrillation) or may be a consequence of the exacerbation.   TEE-guided DCCV done 3/24,  - Remains in [place] for now  - On eliquis 2.5 mg twice a day. No bleeding  4. AKI on CKD stage 4-> ESRD:  - see above.  5. PAD: Patient reports history of peripheral intervention though I do not see this is in history.  6. CVA: In past. Likely related to atrial fibrillation.  - continue on Eliquis for secondary prevention  7. Thrombocytopenia Plts on admit 75 K. PLTs trending down 35>24>>32 > 30k > clumped Heparin stopped 08/28/19   - Continue eliquis 2.5 bid  - No bleeding currently  8. Deconditioning - PT recommending HHPT  Length of Stay: 17  Glori Bickers, MD  09/02/2019, 10:21 AM  Advanced Heart Failure Team Pager [phone] (M-F; 7a - 4p)  Please contact [facility] for night-coverage after hours (4p -7a ) and weekends on [email or web address]

## 2019-09-02 NOTE — Progress Notes (Addendum)
[facility] ROUNDING NOTE   Subjective:   This is a 86 year old gentleman with ischemic cardiomyopathy St. Jude[employer] ICD echo with a EF of less than 20% mildly dilated RV moderate decrease systolic function mild AI is mild to moderate MR severe TR.  He has congestive heart failure complicated by cardiorenal syndrome and recurrent atrial fibrillation with rapid ventricular response.  He had a poor response to diuretics and started on maintenance hemodialysis.  Midodrine 10 mg 3 times daily has been used in order to support blood pressure.  He has a tunneled dialysis cath and has been evaluated by Dr. [name] for placement of permanent access.  I agree with Dr. Scot Dock placement of a permanent access would not necessarily be indicated in view of the multiple comorbidities affecting this gentleman as well as technical difficulties in placement.. Received dialysis 09/01/2019 with 2.1 L removed  Blood pressure 102/56 pulse 87 temperature 97.7 O2 sats 90% 2 L nasal cannula  Last dialysis 09/01/2019 with 2.1 L removed.     Sodium 136 potassium 3.9 chloride 97 CO2 27 BUN 28 creatinine 3.48 glucose 164 calcium 8.8 phosphorus 4.3 albumin 3.3 hemoglobin 9.7 platelets 30.  Allopurinol 50 mg every other day amiodarone 200 mg twice daily, midodrine 10 mg 3 times daily multivitamins 1 daily, Mirapex 0.25 mg twice daily Crestor 10 mg daily Flomax 0.4 mg daily apixaban 2.5 mg twice daily  Patient discussed with Dr. Haroldine Laws.  We will plan dialysis Monday Tuesday and Wednesday in order to achieve adequate ultrafiltration and see if we can decrease his CVP.   Objective:  Vital signs in last 24 hours:  Temp:  [97.5 F (36.4 C)-98 F (36.7 C)] 97.7 F (36.5 C) (04/04 0400) Pulse Rate:  [60-80] 67 (04/04 0500) Resp:  [17-26] 17 (04/04 0500) BP: (92-120)/(48-91) 102/56 (04/04 0500) SpO2:  [99 %-100 %] 100 % (04/04 0500) Weight:  [98 kg-101.3 kg] 98 kg (04/04 0500)  Weight change: 2.824  kg Filed Weights   09/01/19 1810 09/01/19 2115 09/02/19 0500  Weight: 101.3 kg 99.3 kg 98 kg    Intake/Output: I/O last 3 completed shifts: In: 80 [P.O.:860] Out: 2450 [Urine:450; Other:2000]   Intake/Output this shift:  No intake/output data recorded.  Gen: Resting comfortably in bed CVS: Pulse regular rhythm, normal rate, S1 and S2 normal Resp: Diminished breath sounds over bases, no rhonchi.  Right IJ TDC Abd: Soft, obese, nontender Ext: 1+ lower extremity edema   Basic Metabolic Panel: Recent Labs  Lab 08/28/19 0435 08/28/19 0435 08/29/19 0412 08/29/19 0412 08/30/19 0409 08/31/19 0513 09/01/19 0500  NA 135  --  134*  --  138 135 136  K 4.2  --  4.6  --  4.0 4.6 3.9  CL 98  --  93*  --  99 98 97*  CO2 23  --  24  --  25 25 27   GLUCOSE 109*  --  108*  --  119* 112* 164*  BUN 50*  --  62*  --  33* 49* 28*  CREATININE 4.71*  --  5.72*  --  3.98* 4.92* 3.41*  CALCIUM 8.3*   < > 9.0   < > 8.9 8.8* 8.8*  PHOS  --   --   --   --   --  6.2* 4.3   < > = values in this interval not displayed.    Liver Function Tests: Recent Labs  Lab 08/31/19 0513 09/01/19 0500  ALBUMIN 3.3* 3.3*   No results  for input(s): LIPASE, AMYLASE in the last 168 hours. No results for input(s): AMMONIA in the last 168 hours.  CBC: Recent Labs  Lab 08/29/19 0412 08/30/19 0409 08/31/19 0812 09/01/19 0500 09/02/19 0452  WBC 6.3 6.0 5.5 5.3 5.9  HGB 10.8* 10.6* 10.3* 9.7* 9.7*  HCT 33.4* 33.4* 32.8* 31.2* 31.6*  MCV 93.6 94.9 95.1 96.0 97.2  PLT 35* 24* 32* 30* PLATELET CLUMPS NOTED ON SMEAR, UNABLE TO ESTIMATE    Cardiac Enzymes: No results for input(s): CKTOTAL, CKMB, CKMBINDEX, TROPONINI in the last 168 hours.  BNP: Invalid input(s): POCBNP  CBG: No results for input(s): GLUCAP in the last 168 hours.  Microbiology: Results for orders placed or performed during the hospital encounter of 08/16/19  SARS CORONAVIRUS 2 (TAT 6-24 HRS) Nasopharyngeal Nasopharyngeal Swab      Status: None   Collection Time: 08/16/19  9:43 PM   Specimen: Nasopharyngeal Swab  Result Value Ref Range Status   SARS Coronavirus 2 NEGATIVE NEGATIVE Final    Comment: (NOTE) SARS-CoV-2 target nucleic acids are NOT DETECTED. The SARS-CoV-2 RNA is generally detectable in upper and lower respiratory specimens during the acute phase of infection. Negative results do not preclude SARS-CoV-2 infection, do not rule out co-infections with other pathogens, and should not be used as the sole basis for treatment or other patient management decisions. Negative results must be combined with clinical observations, patient history, and epidemiological information. The expected result is Negative. Fact Sheet for Patients: SugarRoll.be Fact Sheet for Healthcare Providers: https://www.woods-mathews.com/ This test is not yet approved or cleared by the [place] FDA and  has been authorized for detection and/or diagnosis of SARS-CoV-2 by FDA under an Emergency Use Authorization (EUA). This EUA will remain  in effect (meaning this test can be used) for the duration of the COVID-19 declaration under Section 56 4(b)(1) of the Act, 21 U.S.C. section 360bbb-3(b)(1), unless the authorization is terminated or revoked sooner. Performed at [facility], [place] [address], [place], [place] [address]     Coagulation Studies: No results for input(s): LABPROT, INR in the last 72 hours.  Urinalysis: No results for input(s): COLORURINE, LABSPEC, PHURINE, GLUCOSEU, HGBUR, BILIRUBINUR, KETONESUR, PROTEINUR, UROBILINOGEN, NITRITE, LEUKOCYTESUR in the last 72 hours.  Invalid input(s): APPERANCEUR    Imaging: DG CHEST PORT 1 VIEW  Result Date: 09/01/2019 CLINICAL DATA:  Shortness of breath EXAM: PORTABLE CHEST 1 VIEW COMPARISON:  August 18, 2019 chest radiograph and chest CT July 07, 2019 FINDINGS: There is scarring in the right base. There is no edema or  airspace opacity. Heart is mildly enlarged with pulmonary vascularity normal. Patient is status post median sternotomy. Pacemaker lead attached to right ventricle, stable. Central catheter tip in superior vena cava. No pneumothorax. No bone lesions. IMPRESSION: Scarring right base. Lungs otherwise clear. Stable cardiac silhouette. Postoperative changes noted. Central catheter tip in superior vena cava. Electronically Signed   By: Lowella Grip III M.D.   On: 09/01/2019 10:03     Medications:   . sodium chloride Stopped (08/19/19 1323)   . allopurinol  50 mg Oral QODAY  . amiodarone  200 mg Oral BID  . apixaban  2.5 mg Oral BID  . Chlorhexidine Gluconate Cloth  6 each Topical Q0600  . docusate sodium  200 mg Oral Daily  . folic acid-pyridoxine-cyancobalamin  1 tablet Oral Daily  . melatonin  6 mg Oral QHS  . midodrine  15 mg Oral TID with meals  . multivitamin  1 tablet Oral QHS  .  polyethylene glycol  17 g Oral Daily  . pramipexole  0.25 mg Oral BID  . rosuvastatin  10 mg Oral q1800  . sevelamer carbonate  2,400 mg Oral TID WC  . sodium chloride flush  10-40 mL Intracatheter Q12H  . sodium chloride flush  3 mL Intravenous Q12H  . tamsulosin  0.4 mg Oral Daily   sodium chloride, acetaminophen, HYDROmorphone (DILAUDID) injection, sodium chloride flush, sodium chloride flush  Assessment/ Plan:  1. Acute kidney Injury on chronic kidney disease stage IV: Hemodynamically mediated secondary to CHF exacerbation and unfortunately failed maximal medical therapy for which he was started on dialysis 08/24/2019.  Marland Kitchen  Evaluated by Dr. Scot Dock and the plan is to continue dialysis via [facility] given technical difficulties with getting permanent access.  Discussed with cardiology.  Tolerated 2.1 L removed on 09/01/2019 palliative medicine also involved.  Outcome does not appear to be good.  We will plan next dialysis treatment 09/03/2019 2.  Acute exacerbation of congestive heart failure: Did not respond to  inotropic support and diuretics and no changes to treatment response following cardioversion.  Clinically improving following dialysis.  Ejection fraction less than 20%. 3.  Anemia: Hemoglobin and hematocrit currently at goal: No overt losses.  Monitor for ESA. 4.  History of atrial fibrillation status post cardioversion: Amiodarone and apixaban 5.  DNR status.  Appropriate 6.  Chronic thrombocytopenia 7.  Hepatic cirrhosis  8. Hypersphatemia we will add binder will start with Renvela 800 mg 3 with meals  Patient has a dialysis schedule Monday Wednesday Friday [facility].     LOS: [place] @TODAY @7 :24 AM

## 2019-09-03 LAB — CBC
HCT: 31.6 % — ABNORMAL LOW (ref 39.0–52.0)
Hemoglobin: 9.7 g/dL — ABNORMAL LOW (ref 13.0–17.0)
MCH: 30.3 pg (ref 26.0–34.0)
MCHC: 30.7 g/dL (ref 30.0–36.0)
MCV: 98.8 fL (ref 80.0–100.0)
Platelets: 31 10*3/uL — ABNORMAL LOW (ref 150–400)
RBC: 3.2 MIL/uL — ABNORMAL LOW (ref 4.22–5.81)
RDW: 16.7 % — ABNORMAL HIGH (ref 11.5–15.5)
WBC: 6.6 10*3/uL (ref 4.0–10.5)
nRBC: 0 % (ref 0.0–0.2)

## 2019-09-03 LAB — BASIC METABOLIC PANEL
Anion gap: 12 (ref 5–15)
BUN: 30 mg/dL — ABNORMAL HIGH (ref 8–23)
CO2: 27 mmol/L (ref 22–32)
Calcium: 8.7 mg/dL — ABNORMAL LOW (ref 8.9–10.3)
Chloride: 101 mmol/L (ref 98–111)
Creatinine, Ser: 3.62 mg/dL — ABNORMAL HIGH (ref 0.61–1.24)
GFR calc Af Amer: 17 mL/min — ABNORMAL LOW (ref >60)
GFR calc non Af Amer: 14 mL/min — ABNORMAL LOW (ref >60)
Glucose, Bld: 166 mg/dL — ABNORMAL HIGH (ref 70–99)
Potassium: 3.9 mmol/L (ref 3.5–5.1)
Sodium: 140 mmol/L (ref 135–145)

## 2019-09-03 LAB — COOXEMETRY PANEL
Carboxyhemoglobin: 0.9 % (ref 0.5–1.5)
Methemoglobin: 0.7 % (ref 0.0–1.5)
O2 Saturation: 53.9 %
Total hemoglobin: 9.1 g/dL — ABNORMAL LOW (ref 12.0–16.0)

## 2019-09-03 MED ORDER — SODIUM CHLORIDE 0.9 % IV SOLN: 100.0000 mL | INTRAVENOUS | Status: DC | PRN

## 2019-09-03 MED ORDER — HEPARIN SODIUM (PORCINE) 1000 UNIT/ML DIALYSIS: 20.0000 [IU]/kg | INTRAMUSCULAR | Status: DC | PRN

## 2019-09-03 MED ORDER — HEPARIN SODIUM (PORCINE) 1000 UNIT/ML DIALYSIS: 1000.0000 [IU] | INTRAMUSCULAR | Status: DC | PRN

## 2019-09-03 MED ORDER — LIDOCAINE-PRILOCAINE 2.5-2.5 % EX CREA: 1.0000 "application " | TOPICAL_CREAM | CUTANEOUS | Status: DC | PRN

## 2019-09-03 MED ORDER — ALTEPLASE 2 MG IJ SOLR: 2.0000 mg | Freq: Once | INTRAMUSCULAR | Status: DC | PRN

## 2019-09-03 MED ORDER — CHLORHEXIDINE GLUCONATE CLOTH 2 % EX PADS
6.0000 | MEDICATED_PAD | Freq: Every day | CUTANEOUS | Status: DC
  Administered 2019-09-03 – 2019-09-07 (×4): 6 via TOPICAL

## 2019-09-03 MED ORDER — LIDOCAINE HCL (PF) 1 % IJ SOLN: 5.0000 mL | INTRAMUSCULAR | Status: DC | PRN

## 2019-09-03 MED ORDER — PENTAFLUOROPROP-TETRAFLUOROETH EX AERO: 1.0000 "application " | INHALATION_SPRAY | CUTANEOUS | Status: DC | PRN

## 2019-09-03 MED ORDER — AMIODARONE HCL 200 MG PO TABS
200.0000 mg | ORAL_TABLET | Freq: Every day | ORAL | Status: DC
  Administered 2019-09-04 – 2019-09-10 (×7): 200 mg via ORAL
  Filled 2019-09-03 (×8): qty 1

## 2019-09-03 NOTE — Progress Notes (Addendum)
Patient ID: Ricardo Payne, male   DOB: 1930-10-16, 86 y.o.   MRN: [id number]     Advanced Heart Failure Rounding Note  PCP-Cardiologist: Donato Heinz, MD   Subjective:    CVP 26.   Denies SOB. Able rest last night  Objective:   Weight Range: 99.5 kg Body mass index is 35.41 kg/m.   Vital Signs:   Temp:  [97.6 F (36.4 C)-98.7 F (37.1 C)] 98.5 F (36.9 C) (04/05 0450) Pulse Rate:  [66-109] 66 (04/05 0700) Resp:  [16-22] 22 (04/05 0700) BP: (99-143)/(45-76) 111/71 (04/05 0700) SpO2:  [98 %-100 %] 100 % (04/05 0700) Weight:  [99.5 kg] 99.5 kg (04/05 0058) Last BM Date: 09/02/19  Weight change: Filed Weights   09/01/19 2115 09/02/19 0500 09/03/19 0058  Weight: 99.3 kg 98 kg 99.5 kg    Intake/Output:   Intake/Output Summary (Last 24 hours) at 09/03/2019 0752 Last data filed at 09/02/2019 2100 Gross per 24 hour  Intake 890 ml  Output 75 ml  Net 815 ml      Physical Exam  CVP 26  General:  Well appearing. No resp difficulty HEENT: normal Neck: supple. JVP to jaw  Carotids 2+ bilat; no bruits. No lymphadenopathy or thryomegaly appreciated. Cor: PMI nondisplaced. Regular rate & rhythm. No rubs, gallops or murmurs. R upper chest HD catheter  Lungs: clear Abdomen: soft, nontender, nondistended. No hepatosplenomegaly. No bruits or masses. Good bowel sounds. Extremities: no cyanosis, clubbing, rash, R and LLE 2+ edema Neuro: alert & orientedx3, cranial nerves grossly intact. moves all 4 extremities w/o difficulty. Affect pleasant   Telemetry  NSR 70-80s    Labs    CBC Recent Labs    09/02/19 0452 09/03/19 0433  WBC 5.9 6.6  HGB 9.7* 9.7*  HCT 31.6* 31.6*  MCV 97.2 98.8  PLT PLATELET CLUMPS NOTED ON SMEAR, UNABLE TO ESTIMATE 31*   Basic Metabolic Panel Recent Labs    09/01/19 0500 09/03/19 0433  NA 136 140  K 3.9 3.9  CL 97* 101  CO2 27 27  GLUCOSE 164* 166*  BUN 28* 30*  CREATININE 3.41* 3.62*  CALCIUM 8.8* 8.7*  PHOS 4.3  --     Liver Function Tests Recent Labs    09/01/19 0500  ALBUMIN 3.3*   No results for input(s): LIPASE, AMYLASE in the last 72 hours. Cardiac Enzymes No results for input(s): CKTOTAL, CKMB, CKMBINDEX, TROPONINI in the last 72 hours.  BNP: BNP (last 3 results) Recent Labs    08/13/19 1437 08/16/19 1659 08/17/19 0714  BNP 1,224.9* 1,081.1* 1,056.0*    ProBNP (last 3 results) Recent Labs    05/21/19 1535  PROBNP 6,191*     D-Dimer No results for input(s): DDIMER in the last 72 hours. Hemoglobin A1C No results for input(s): HGBA1C in the last 72 hours. Fasting Lipid Panel No results for input(s): CHOL, HDL, LDLCALC, TRIG, CHOLHDL, LDLDIRECT in the last 72 hours. Thyroid Function Tests No results for input(s): TSH, T4TOTAL, T3FREE, THYROIDAB in the last 72 hours.  Invalid input(s): FREET3  Other results:   Imaging    No results found.   Medications:     Scheduled Medications: . allopurinol  50 mg Oral QODAY  . amiodarone  200 mg Oral BID  . apixaban  2.5 mg Oral BID  . Chlorhexidine Gluconate Cloth  6 each Topical Q0600  . Chlorhexidine Gluconate Cloth  6 each Topical Q0600  . docusate sodium  200 mg Oral Daily  . folic acid-pyridoxine-cyancobalamin  1 tablet Oral Daily  . melatonin  6 mg Oral QHS  . midodrine  15 mg Oral TID with meals  . multivitamin  1 tablet Oral QHS  . polyethylene glycol  17 g Oral Daily  . pramipexole  0.25 mg Oral BID  . rosuvastatin  10 mg Oral q1800  . sevelamer carbonate  2,400 mg Oral TID WC  . sodium chloride flush  10-40 mL Intracatheter Q12H  . sodium chloride flush  3 mL Intravenous Q12H  . tamsulosin  0.4 mg Oral Daily    Infusions: . sodium chloride Stopped (08/19/19 1323)    PRN Medications: sodium chloride, acetaminophen, HYDROmorphone (DILAUDID) injection, sodium chloride flush, sodium chloride flush   Assessment/Plan   1. Acute on chronic systolic CHF: Ischemic cardiomyopathy.  St Jude ICD.  Echo in 2/21  with EF <20%, mildly dilated RV with moderately decreased systolic function, mild AS, mild-moderate MR, severe TR.  CHF is complicated by cardiorenal syndrome and recurrent atrial fibrillation with RVR.  Suspect low output HF.  He was on milrinone but now off.  - Maintaining NSR after DCCV.  - Started iHD 3/25.  - Remains markedly volume overloaded with CVP 26 despite several weeks of HD - Co-ox marginal at 54%  - He has end-stage systolic HF with marked volume overload and recent development of ESRD. Co-ox remains. CVP 26 today  - Volume removal has been limited by low BP despite midodrine 15 tid - Patient is adamant he does not want to give up but I explained to him that his situation is likely to progress and will become increasingly intolerant to volume removal - Planning HD for 3 days in a row this week (M,Tu, W) to see if we can get CVP < 15 with consistent HD.  2. CAD: S/p remote CABG (1989).  He does not think he has had a coronary evaluation since that time.  No chest pain.  HS-TnI not elevated.  - NO s/s ischemia.  - Continue Crestor.  - No ASA given anticoagulation.  3. Atrial fibrillation: Paroxysmal.  He was admitted with atrial fibrillation with RVR.  This may have triggered his CHF exacerbation (though based on device interrogation, worsening volume status does not seem to necessarily track with atrial fibrillation) or may be a consequence of the exacerbation.  TEE-guided DCCV done 3/24,  - Remains in [place] for now  - On eliquis 2.5 mg twice a day. No bleeding 4. AKI on CKD stage 4-> ESRD:  - see above. Plan for HD today  5. PAD: Patient reports history of peripheral intervention though I do not see this is in history.  6. CVA: In past. Likely related to atrial fibrillation.  - continue on Eliquis for secondary prevention  7. Thrombocytopenia Plts on admit 75 K. PLTs trending down 35>24>>32 > 30k >31K Heparin stopped 08/28/19   - Continue eliquis 2.5 bid  - No  bleeding currently  8. Deconditioning - PT recommending HHPT  He wants to continue HD.  Length of Stay: [place], NP  09/03/2019, 7:52 AM  Advanced Heart Failure Team Pager [phone] (M-F; 7a - 4p)  Please contact [facility] for night-coverage after hours (4p -7a ) and weekends on [email or web address]  Patient seen with NP, agree with the above note.   He remains volume overloaded on exam with CVP > 20.  However, he is walking in hall without much difficulty.  He remains in NSR.  UF limited by  low BP despite midodrine 15 mg tid.  Suspect significant RV dysfunction with severe TR.   Agree with plan to try HD for the next three days, will try to push down CVP to 15 range if possible.  Hopefully, he will be able to manage HD at home, but RV failure/hypotension are complicating this effort.    Can decrease amiodarone to 200 mg daily.   He has low plts (pre-existed admission but now stable in the 30s range).  He is on apixaban, no heparin products.   Loralie Champagne 09/03/2019 1:45 PM

## 2019-09-03 NOTE — Progress Notes (Signed)
[facility] NEPHROLOGY PROGRESS NOTE  Assessment/ Plan: Pt is a 86 y.o. yo male with ICM, EF less than 20%, dilated RV, CAD, AKI on CKD now dialysis dependent.  #Acute kidney injury on CKD stage IV due to cardiorenal syndrome: Started dialysis on 3/26.  Seen by vascular who placed tunneled dialysis catheter, no plan for permanent access because of multiple comorbidities and technical difficulties.  Cardiology note noted.  Plan for serial dialysis to optimize volume status.  #Acute on chronic CHF: Did not respond with inotropic support and diuretics.  EF is very low.  CVP 26 per cardiology.  Now volume status managed with dialysis as discussed above.  Cardiology following.  # Anemia of CKD: Check iron studies.  # Secondary hyperparathyroidism: At goal.  Check PTH level.  Continue Renvela.  # HTN/volume; monitor blood pressure.  UF during HD.  Subjective: Seen and examined at bedside.  Patient denies nausea vomiting chest pain shortness of breath.  Plan for HD today.  No new event. Objective Vital signs in last 24 hours: Vitals:   09/03/19 0450 09/03/19 0500 09/03/19 0700 09/03/19 0755  BP: (!) 103/57 106/76 111/71 110/63  Pulse: 68  66 71  Resp: (!) 21  (!) 22 20  Temp: 98.5 F (36.9 C)     TempSrc: Oral     SpO2: 100%  100% 100%  Weight:      Height:       Weight change: -1.781 kg  Intake/Output Summary (Last 24 hours) at 09/03/2019 1036 Last data filed at 09/03/2019 0900 Gross per 24 hour  Intake 630 ml  Output 75 ml  Net 555 ml       Labs: Basic Metabolic Panel: Recent Labs  Lab 08/31/19 0513 09/01/19 0500 09/03/19 0433  NA 135 136 140  K 4.6 3.9 3.9  CL 98 97* 101  CO2 25 27 27   GLUCOSE 112* 164* 166*  BUN 49* 28* 30*  CREATININE 4.92* 3.41* 3.62*  CALCIUM 8.8* 8.8* 8.7*  PHOS 6.2* 4.3  --    Liver Function Tests: Recent Labs  Lab 08/31/19 0513 09/01/19 0500  ALBUMIN 3.3* 3.3*   No results for input(s): LIPASE, AMYLASE in the last 168  hours. No results for input(s): AMMONIA in the last 168 hours. CBC: Recent Labs  Lab 08/30/19 0409 08/30/19 0409 08/31/19 0812 08/31/19 0812 09/01/19 0500 09/02/19 0452 09/03/19 0433  WBC 6.0   < > 5.5   < > 5.3 5.9 6.6  HGB 10.6*   < > 10.3*   < > 9.7* 9.7* 9.7*  HCT 33.4*   < > 32.8*   < > 31.2* 31.6* 31.6*  MCV 94.9  --  95.1  --  96.0 97.2 98.8  PLT 24*   < > 32*   < > 30* PLATELET CLUMPS NOTED ON SMEAR, UNABLE TO ESTIMATE 31*   < > = values in this interval not displayed.   Cardiac Enzymes: No results for input(s): CKTOTAL, CKMB, CKMBINDEX, TROPONINI in the last 168 hours. CBG: No results for input(s): GLUCAP in the last 168 hours.  Iron Studies: No results for input(s): IRON, TIBC, TRANSFERRIN, FERRITIN in the last 72 hours. Studies/Results: No results found.  Medications: Infusions: . sodium chloride Stopped (08/19/19 1323)    Scheduled Medications: . allopurinol  50 mg Oral QODAY  . amiodarone  200 mg Oral BID  . apixaban  2.5 mg Oral BID  . Chlorhexidine Gluconate Cloth  6 each Topical Q0600  . Chlorhexidine Gluconate  Cloth  6 each Topical Q0600  . docusate sodium  200 mg Oral Daily  . folic acid-pyridoxine-cyancobalamin  1 tablet Oral Daily  . melatonin  6 mg Oral QHS  . midodrine  15 mg Oral TID with meals  . multivitamin  1 tablet Oral QHS  . polyethylene glycol  17 g Oral Daily  . pramipexole  0.25 mg Oral BID  . rosuvastatin  10 mg Oral q1800  . sevelamer carbonate  2,400 mg Oral TID WC  . sodium chloride flush  10-40 mL Intracatheter Q12H  . sodium chloride flush  3 mL Intravenous Q12H  . tamsulosin  0.4 mg Oral Daily    have reviewed scheduled and prn medications.  Physical Exam: General:NAD, comfortable Heart:RRR, s1s2 nl Lungs:clear b/l, no crackle Abdomen:soft, Non-tender, non-distended Extremities:No edema Dialysis Access: Right IJ TDC.  Ronique Simerly Prasad Jori Thrall 09/03/2019,10:36 AM  LOS: 18 days  Pager: [phone]

## 2019-09-04 ENCOUNTER — Encounter (HOSPITAL_COMMUNITY): Payer: Self-pay | Admitting: Cardiology

## 2019-09-04 DIAGNOSIS — N1832 Chronic kidney disease, stage 3b: Secondary | ICD-10-CM

## 2019-09-04 DIAGNOSIS — N170 Acute kidney failure with tubular necrosis: Secondary | ICD-10-CM

## 2019-09-04 LAB — RETICULOCYTES
Immature Retic Fract: 9.7 % (ref 2.3–15.9)
RBC.: 3.17 MIL/uL — ABNORMAL LOW (ref 4.22–5.81)
Retic Count, Absolute: 33.3 10*3/uL (ref 19.0–186.0)
Retic Ct Pct: 1.1 % (ref 0.4–3.1)

## 2019-09-04 LAB — CBC
HCT: 31.6 % — ABNORMAL LOW (ref 39.0–52.0)
Hemoglobin: 9.7 g/dL — ABNORMAL LOW (ref 13.0–17.0)
MCH: 30.2 pg (ref 26.0–34.0)
MCHC: 30.7 g/dL (ref 30.0–36.0)
MCV: 98.4 fL (ref 80.0–100.0)
Platelets: 21 10*3/uL — CL (ref 150–400)
RBC: 3.21 MIL/uL — ABNORMAL LOW (ref 4.22–5.81)
RDW: 16.9 % — ABNORMAL HIGH (ref 11.5–15.5)
WBC: 6.7 10*3/uL (ref 4.0–10.5)
nRBC: 0 % (ref 0.0–0.2)

## 2019-09-04 LAB — COOXEMETRY PANEL
Carboxyhemoglobin: 1.3 % (ref 0.5–1.5)
Methemoglobin: 0.6 % (ref 0.0–1.5)
O2 Saturation: 53.9 %
Total hemoglobin: 9.8 g/dL — ABNORMAL LOW (ref 12.0–16.0)

## 2019-09-04 LAB — RENAL FUNCTION PANEL
Albumin: 3.3 g/dL — ABNORMAL LOW (ref 3.5–5.0)
Anion gap: 11 (ref 5–15)
BUN: 22 mg/dL (ref 8–23)
CO2: 27 mmol/L (ref 22–32)
Calcium: 8.6 mg/dL — ABNORMAL LOW (ref 8.9–10.3)
Chloride: 100 mmol/L (ref 98–111)
Creatinine, Ser: 3.01 mg/dL — ABNORMAL HIGH (ref 0.61–1.24)
GFR calc Af Amer: 21 mL/min — ABNORMAL LOW (ref >60)
GFR calc non Af Amer: 18 mL/min — ABNORMAL LOW (ref >60)
Glucose, Bld: 146 mg/dL — ABNORMAL HIGH (ref 70–99)
Phosphorus: 3.4 mg/dL (ref 2.5–4.6)
Potassium: 4.4 mmol/L (ref 3.5–5.1)
Sodium: 138 mmol/L (ref 135–145)

## 2019-09-04 LAB — LACTATE DEHYDROGENASE: LDH: 267 U/L — ABNORMAL HIGH (ref 98–192)

## 2019-09-04 MED ORDER — SODIUM CHLORIDE 0.9 % IV SOLN: 100.0000 mL | INTRAVENOUS | Status: DC | PRN

## 2019-09-04 MED ORDER — HEPARIN SODIUM (PORCINE) 1000 UNIT/ML DIALYSIS: 1000.0000 [IU] | INTRAMUSCULAR | Status: DC | PRN

## 2019-09-04 MED ORDER — PREDNISONE 50 MG PO TABS
100.0000 mg | ORAL_TABLET | Freq: Every day | ORAL | Status: DC
  Administered 2019-09-05 – 2019-09-09 (×5): 100 mg via ORAL
  Filled 2019-09-04 (×6): qty 2

## 2019-09-04 MED ORDER — LIDOCAINE HCL (PF) 1 % IJ SOLN: 5.0000 mL | INTRAMUSCULAR | Status: DC | PRN

## 2019-09-04 MED ORDER — CHLORHEXIDINE GLUCONATE CLOTH 2 % EX PADS: 6.0000 | MEDICATED_PAD | Freq: Every day | CUTANEOUS | Status: DC

## 2019-09-04 MED ORDER — ALTEPLASE 2 MG IJ SOLR: 2.0000 mg | Freq: Once | INTRAMUSCULAR | Status: DC | PRN

## 2019-09-04 MED ORDER — PENTAFLUOROPROP-TETRAFLUOROETH EX AERO: 1.0000 "application " | INHALATION_SPRAY | CUTANEOUS | Status: DC | PRN

## 2019-09-04 MED ORDER — HEPARIN SODIUM (PORCINE) 1000 UNIT/ML DIALYSIS: 20.0000 [IU]/kg | INTRAMUSCULAR | Status: DC | PRN

## 2019-09-04 MED ORDER — HEPARIN SODIUM (PORCINE) 1000 UNIT/ML IJ SOLN
INTRAMUSCULAR | Status: AC
  Filled 2019-09-04: qty 4

## 2019-09-04 MED ORDER — LIDOCAINE-PRILOCAINE 2.5-2.5 % EX CREA: 1.0000 "application " | TOPICAL_CREAM | CUTANEOUS | Status: DC | PRN

## 2019-09-04 NOTE — Progress Notes (Signed)
Received call from son, Dwayne. Provided update on plan of care and plan for hemodialysis again tomorrow. Reassured of ongoing support from palliative. Will continue to follow.   NO CHARGE  Ihor Dow, FNP-C Palliative Medicine Team  Phone: [phone] Fax: [phone]

## 2019-09-04 NOTE — Progress Notes (Signed)
Patient off of unit to dialysis.

## 2019-09-04 NOTE — Progress Notes (Signed)
[facility] NEPHROLOGY PROGRESS NOTE  Assessment/ Plan: Pt is a 86 y.o. yo male with ICM, EF less than 20%, dilated RV, CAD, AKI on CKD now dialysis dependent.  #Acute kidney injury on CKD stage IV due to cardiorenal syndrome: Started dialysis on 3/26.  Seen by vascular who placed tunneled dialysis catheter, no plan for permanent access because of multiple comorbidities and technical difficulties.  Cardiology note noted.  Plan for serial dialysis to optimize volume status. Had HD yesterday with 2L UF,  HD today and tomorrow.   #Acute on chronic CHF: Did not respond with inotropic support and diuretics.  EF is very low.  CVP 31 per cardiology.  Now volume status managed with dialysis as discussed above.  Cardiology following.  # Anemia of CKD: Check iron studies. Monitor Hb.  # Secondary hyperparathyroidism: Phosphorus at goal.  Check PTH level.  Continue Renvela.  # Hypotension/volume; monitor blood pressure.  UF during HD.  On midodrine to be put HD.  Subjective: Seen and examined .  Receiving dialysis today, blood pressure soft.  Goal UF around 2 to 3 kg if tolerated by BP.  Denies nausea vomiting chest pain shortness of breath.  Objective Vital signs in last 24 hours: Vitals:   09/04/19 1200 09/04/19 1230 09/04/19 1300 09/04/19 1330  BP: (!) 99/56 (!) 100/45 (!) 92/54 (!) 93/38  Pulse: 72 72 83 81  Resp:      Temp:      TempSrc:      SpO2:      Weight:      Height:       Weight change: 1.781 kg  Intake/Output Summary (Last 24 hours) at 09/04/2019 1403 Last data filed at 09/04/2019 0945 Gross per 24 hour  Intake 720 ml  Output 2251 ml  Net -1531 ml       Labs: Basic Metabolic Panel: Recent Labs  Lab 08/31/19 0513 08/31/19 0513 09/01/19 0500 09/03/19 0433 09/04/19 0445  NA 135   < > 136 140 138  K 4.6   < > 3.9 3.9 4.4  CL 98   < > 97* 101 100  CO2 25   < > 27 27 27   GLUCOSE 112*   < > 164* 166* 146*  BUN 49*   < > 28* 30* 22  CREATININE 4.92*    < > 3.41* 3.62* 3.01*  CALCIUM 8.8*   < > 8.8* 8.7* 8.6*  PHOS 6.2*  --  4.3  --  3.4   < > = values in this interval not displayed.   Liver Function Tests: Recent Labs  Lab 08/31/19 0513 09/01/19 0500 09/04/19 0445  ALBUMIN 3.3* 3.3* 3.3*   No results for input(s): LIPASE, AMYLASE in the last 168 hours. No results for input(s): AMMONIA in the last 168 hours. CBC: Recent Labs  Lab 08/31/19 0812 08/31/19 0812 09/01/19 0500 09/01/19 0500 09/02/19 0452 09/03/19 0433 09/04/19 0445  WBC 5.5   < > 5.3   < > 5.9 6.6 6.7  HGB 10.3*   < > 9.7*   < > 9.7* 9.7* 9.7*  HCT 32.8*   < > 31.2*   < > 31.6* 31.6* 31.6*  MCV 95.1  --  96.0  --  97.2 98.8 98.4  PLT 32*   < > 30*   < > PLATELET CLUMPS NOTED ON SMEAR, UNABLE TO ESTIMATE 31* 21*   < > = values in this interval not displayed.   Cardiac Enzymes: No results for input(s):  CKTOTAL, CKMB, CKMBINDEX, TROPONINI in the last 168 hours. CBG: No results for input(s): GLUCAP in the last 168 hours.  Iron Studies: No results for input(s): IRON, TIBC, TRANSFERRIN, FERRITIN in the last 72 hours. Studies/Results: No results found.  Medications: Infusions: . sodium chloride Stopped (08/19/19 1323)    Scheduled Medications: . allopurinol  50 mg Oral QODAY  . amiodarone  200 mg Oral Daily  . Chlorhexidine Gluconate Cloth  6 each Topical Q0600  . docusate sodium  200 mg Oral Daily  . folic acid-pyridoxine-cyancobalamin  1 tablet Oral Daily  . melatonin  6 mg Oral QHS  . midodrine  15 mg Oral TID with meals  . multivitamin  1 tablet Oral QHS  . polyethylene glycol  17 g Oral Daily  . pramipexole  0.25 mg Oral BID  . predniSONE  100 mg Oral Q breakfast  . rosuvastatin  10 mg Oral q1800  . sevelamer carbonate  2,400 mg Oral TID WC  . sodium chloride flush  10-40 mL Intracatheter Q12H  . sodium chloride flush  3 mL Intravenous Q12H  . tamsulosin  0.4 mg Oral Daily    have reviewed scheduled and prn medications.  Physical  Exam: General:NAD, comfortable Heart:RRR, s1s2 nl Lungs:clear b/l, no crackle Abdomen:soft, Non-tender, non-distended Extremities: Trace LE edema Dialysis Access: Right IJ TDC.  Doris Mcgilvery Tanna Furry 09/04/2019,2:03 PM  LOS: 19 days  Pager: [phone]

## 2019-09-04 NOTE — Progress Notes (Signed)
Pt asleep. Agreed to ambulate however after I got him ready, he declined (had immediately fallen back asleep when I left the room). He sts he is so comfortable. I encouraged him to walk later with PT.  [id number] Yves Dill CES, ACSM 11:36 AM 09/04/2019

## 2019-09-04 NOTE — Consult Note (Addendum)
[place]  [phone] Fax:([phone]) [place]  Referring MD:  Dr. Loralie Champagne  Reason for Referral: Thrombocytopenia  HPI: Ricardo Payne is an 86 year old male with a past medical history significant for CAD status post CABG, ischemic cardiomyopathy status post ICD, CKD, paroxysmal atrial fibrillation, hypertension, hyperlipidemia, history of CVA.  The patient was admitted due to worsening shortness of breath and increased edema.  On the day of admission, his platelet count was 90,000.  During his hospitalization, he has had a continued drop in his platelet count down to 21,000 today.  Review of his lab work dating back to December 2020 show that his baseline platelet count is in the 70-90,000 range.  Due to his low platelet count, his Eliquis has been discontinued.  During this hospitalization, he has received a heparin infusion which was started on 08/21/2019 and stopped on 08/28/2019.  I see an order for as needed heparin during dialysis, but I do not see that this is being given.  During his hospitalization, the patient been started on dialysis.  The patient is being followed by hematology as an outpatient due to persistent anemia and ITP.  He has been prescribed iron tablets and has been on observation for his thrombocytopenia.  The patient is seen during hemodialysis today.  He reports ongoing edema with improvement in his orthopnea.  He reports some oozing of blood around his HD catheter, but does not report any additional bleeding.  Denies recent fever or chills.  He denies chest pain.  Denies abdominal pain, nausea, vomiting, constipation, diarrhea.  He has no other complaints today.  Hematology was asked see the patient to make recommendations regarding his thrombocytopenia.  Past Medical History:  Diagnosis Date  . Acquired thrombophilia ([place])   . Age-related cataract of both eyes   . ASHD (arteriosclerotic heart disease)   . Benign  prostatic hyperplasia   . CAD (coronary artery disease)    a. s/p CABG x1 in 1990  . Cerebellar stroke ([place]) 07/02/2016  . Chronic combined systolic and diastolic CHF (congestive heart failure) ([place])   . Chronic gout without tophus   . CKD (chronic kidney disease), stage IV ([place])   . CVA (cerebral vascular accident) ([place]) 2018  . Essential hypertension   . ICD (implantable cardioverter-defibrillator) in place   . Ischemic cardiomyopathy   . Mixed hyperlipidemia   . PAF (paroxysmal atrial fibrillation) ([place])   . Restless legs   . Secondary pulmonary arterial hypertension ([place])   . Thrombocytopenia ([place])   . Vitamin D deficiency   :    Past Surgical History:  Procedure Laterality Date  . CARDIOVERSION N/A 08/22/2019   Procedure: CARDIOVERSION;  Surgeon: Larey Dresser, MD;  Location: [facility] ENDOSCOPY;  Service: Cardiovascular;  Laterality: N/A;  . CHOLECYSTECTOMY    . CORONARY ARTERY BYPASS GRAFT    . IR FLUORO GUIDE CV LINE RIGHT  08/23/2019  . IR US GUIDE VASC ACCESS RIGHT  08/23/2019  . PACEMAKER IMPLANT  10/2018  . TEE WITHOUT CARDIOVERSION N/A 08/22/2019   Procedure: TRANSESOPHAGEAL ECHOCARDIOGRAM (TEE);  Surgeon: Larey Dresser, MD;  Location: [facility] ENDOSCOPY;  Service: Cardiovascular;  Laterality: N/A;  :   CURRENT MEDS: Current Facility-Administered Medications  Medication Dose Route Frequency Provider Last Rate Last Admin  . 0.9 %  sodium chloride infusion  250 mL Intravenous PRN Milus Banister, MD   Stopped at 08/19/19 1323  . acetaminophen (TYLENOL) tablet 650 mg  650 mg  Oral Q4H PRN Milus Banister, MD   650 mg at 09/02/19 0037  . allopurinol (ZYLOPRIM) tablet 50 mg  50 mg Oral William Hamburger, MD   50 mg at 09/04/19 0902  . amiodarone (PACERONE) tablet 200 mg  200 mg Oral Daily Larey Dresser, MD   200 mg at 09/04/19 0902  . Chlorhexidine Gluconate Cloth 2 % PADS 6 each  6 each Topical Q0600 Rosita Fire, MD   6 each at 09/04/19 1035  . docusate sodium  (COLACE) capsule 200 mg  200 mg Oral Daily Clegg, Amy D, NP   200 mg at 09/04/19 0903  . folic acid-pyridoxine-cyancobalamin (FOLTX) 2.5-25-2 MG per tablet 1 tablet  1 tablet Oral Daily O'Neal, Cassie Freer, MD   1 tablet at 09/04/19 0907  . HYDROmorphone (DILAUDID) injection 0.5 mg  0.5 mg Intravenous Q6H PRN Basilio Cairo, NP      . melatonin tablet 6 mg  6 mg Oral QHS Geralynn Rile, MD   6 mg at 09/03/19 2244  . midodrine (PROAMATINE) tablet 15 mg  15 mg Oral TID with meals Bensimhon, Shaune Pascal, MD   15 mg at 09/04/19 1100  . multivitamin (RENA-VIT) tablet 1 tablet  1 tablet Oral QHS Elmarie Shiley, MD   1 tablet at 09/03/19 2243  . polyethylene glycol (MIRALAX / GLYCOLAX) packet 17 g  17 g Oral Daily Lyda Jester M, PA-C   17 g at 08/28/19 4742  . pramipexole (MIRAPEX) tablet 0.25 mg  0.25 mg Oral BID Milus Banister, MD   0.25 mg at 09/04/19 0906  . rosuvastatin (CRESTOR) tablet 10 mg  10 mg Oral q1800 Milus Banister, MD   10 mg at 09/03/19 1852  . sevelamer carbonate (RENVELA) tablet 2,400 mg  2,400 mg Oral TID WC Edrick Oh, MD   2,400 mg at 09/04/19 0904  . sodium chloride flush (NS) 0.9 % injection 10-40 mL  10-40 mL Intracatheter Q12H O'Neal, Cassie Freer, MD   10 mL at 09/04/19 0908  . sodium chloride flush (NS) 0.9 % injection 10-40 mL  10-40 mL Intracatheter PRN O'Neal, Cassie Freer, MD      . sodium chloride flush (NS) 0.9 % injection 3 mL  3 mL Intravenous Q12H Milus Banister, MD   3 mL at 09/03/19 0828  . sodium chloride flush (NS) 0.9 % injection 3 mL  3 mL Intravenous PRN Milus Banister, MD      . tamsulosin [facility]) capsule 0.4 mg  0.4 mg Oral Daily Milus Banister, MD   0.4 mg at 09/04/19 0908      No Known Allergies:  Family History  Problem Relation Age of Onset  . Hypertension Mother   . Suicidality Mother   . Valvular heart disease Father   . Heart disease Father   . Heart attack Brother   :  Social History   Socioeconomic History  . Marital  status: Widowed    Spouse name: Not on file  . Number of children: 2  . Years of education: Not on file  . Highest education level: Not on file  Occupational History  . Not on file  Tobacco Use  . Smoking status: Former Smoker    Quit date: 1988    Years since quitting: 33.2  . Smokeless tobacco: Never Used  Substance and Sexual Activity  . Alcohol use: Yes    Comment: rare  . Drug use: Never  . Sexual activity:  Not on file  Other Topics Concern  . Not on file  Social History Narrative  . Not on file   Social Determinants of Health   Financial Resource Strain:   . Difficulty of Paying Living Expenses:   Food Insecurity:   . Worried About Charity fundraiser in the Last Year:   . Arboriculturist in the Last Year:   Transportation Needs:   . Film/video editor (Medical):   Marland Kitchen Lack of Transportation (Non-Medical):   Physical Activity:   . Days of Exercise per Week:   . Minutes of Exercise per Session:   Stress:   . Feeling of Stress :   Social Connections:   . Frequency of Communication with Friends and Family:   . Frequency of Social Gatherings with Friends and Family:   . Attends Religious Services:   . Active Member of Clubs or Organizations:   . Attends Archivist Meetings:   Marland Kitchen Marital Status:   Intimate Partner Violence:   . Fear of Current or Ex-Partner:   . Emotionally Abused:   Marland Kitchen Physically Abused:   . Sexually Abused:   :  REVIEW OF SYSTEMS: A comprehensive 14 point review of systems was negative except as noted in the HPI.  Exam: Patient Vitals for the past 24 hrs:  BP Temp Temp src Pulse Resp SpO2 Weight  09/04/19 0808 97/65 98.3 F (36.8 C) Oral 71 18 98 % --  09/04/19 0440 (!) 99/51 98.4 F (36.9 C) Oral -- 15 95 % --  09/04/19 0102 -- -- -- -- -- -- 98 kg  09/04/19 0050 101/73 -- -- -- -- 98 % --  09/04/19 0000 -- -- -- 70 -- 100 % --  09/03/19 2200 (!) 105/57 -- -- 73 20 100 % --  09/03/19 2023 (!) 97/59 98.7 F (37.1 C) Oral  79 16 100 % --  09/03/19 1801 (!) 114/54 98.1 F (36.7 C) Oral 80 (!) 25 100 % 99 kg  09/03/19 1730 (!) 106/57 -- -- 72 18 -- --  09/03/19 1700 114/63 -- -- 75 18 -- --  09/03/19 1630 105/62 -- -- 70 20 -- --  09/03/19 1600 (!) 104/53 -- -- 76 18 -- --  09/03/19 1531 (!) 100/52 -- -- 70 17 -- --  09/03/19 1520 (!) 102/55 98.2 F (36.8 C) Oral 71 -- 99 % 101.3 kg  09/03/19 1158 110/72 98.2 F (36.8 C) Oral 70 18 99 % --    General: Awake and alert, no distress Eyes:  no scleral icterus.   ENT:  There were no oropharyngeal lesions.   Lymphatics:  Negative cervical, supraclavicular or axillary adenopathy.   Respiratory: lungs were clear bilaterally without wheezing or crackles.   Cardiovascular:  Regular rate and rhythm, S1/S2, without murmur, rub or gallop.  Trace lower extremity edema.   GI:  abdomen was soft, flat, nontender, nondistended, without organomegaly.   Skin: Scattered ecchymoses over the bilateral upper extremities.  No petechiae noted.  Dried blood noted under his HD catheter dressing. Neuro exam was nonfocal.  The patient was alert and oriented x3.  Language was appropriate.  Mood was normal without depression.  Speech was not pressured.  Thought content was not tangential.    LABS:  Lab Results  Component Value Date   WBC 6.7 09/04/2019   HGB 9.7 (L) 09/04/2019   HCT 31.6 (L) 09/04/2019   PLT 21 (LL) 09/04/2019   GLUCOSE 146 (H) 09/04/2019  CHOL 144 03/26/2019   TRIG 70 03/26/2019   HDL 71 03/26/2019   LDLCALC 59 03/26/2019   ALT 37 08/16/2019   AST 40 08/16/2019   NA 138 09/04/2019   K 4.4 09/04/2019   CL 100 09/04/2019   CREATININE 3.01 (H) 09/04/2019   BUN 22 09/04/2019   CO2 27 09/04/2019   INR 1.8 (H) 08/21/2019    DG Chest 2 View  Result Date: 08/16/2019 CLINICAL DATA:  Dyspnea. Fluid retention. Congestive heart failure with bilateral lower extremity and scrotal edema. EXAM: CHEST - 2 VIEW COMPARISON:  July 18, 2019 FINDINGS: Stable mild  cardiomegaly with central vascular congestion and mild thickening of peripheral interlobular septa. No obvious pleural effusion. Redemonstration of mediastinal and upper abdominal surgical clips, intact median sternotomy wires, and single left ventricular lead cardiac defibrillator, similarly positioned with left-sided battery pack. Aortic knob calcified atherosclerosis. Streaky consolidation in the bilateral infrahilar regions, likely atelectatic. No pneumonia or pneumothorax. Skeletal degenerative changes and bony demineralization. IMPRESSION: Stable mild cardiomegaly with mild pulmonary edema and chronic thoracic postoperative changes. No pneumonia or obvious pleural effusion. Aortic calcified atherosclerosis. Electronically Signed   By: Revonda Humphrey   On: 08/16/2019 18:00   IR Fluoro Guide CV Line Right  Result Date: 08/23/2019 CLINICAL DATA:  Renal failure. Needs durable venous access for hemodialysis. EXAM: TUNNELED HEMODIALYSIS CATHETER PLACEMENT WITH ULTRASOUND AND FLUOROSCOPIC GUIDANCE TECHNIQUE: The procedure, risks, benefits, and alternatives were explained to the patient. Questions regarding the procedure were encouraged and answered. The patient understands and consents to the procedure. As antibiotic prophylaxis, cefazolin 2 g was ordered pre-procedure and administered intravenously within one hour of incision. Patency of the right IJ vein was confirmed with ultrasound with image documentation. An appropriate skin site was determined. Region was prepped using maximum barrier technique including cap and mask, sterile gown, sterile gloves, large sterile sheet, and Chlorhexidine as cutaneous antisepsis. The region was infiltrated locally with 1% lidocaine. Intravenous Fentanyl 58mcg and Versed 0.5mg  were administered as conscious sedation during continuous monitoring of the patient's level of consciousness and physiological / cardiorespiratory status by the radiology RN, with a total moderate  sedation time of 13 minutes. Under real-time ultrasound guidance, the right IJ vein was accessed with a 21 gauge micropuncture needle; the needle tip within the vein was confirmed with ultrasound image documentation. Needle exchanged over the 018 guidewire for transitional dilator, which allowed advancement of a Benson wire into the IVC. Over this, an MPA catheter was advanced. A Palindrome 19 hemodialysis catheter was tunneled from the right anterior chest wall approach to the right IJ dermatotomy site. The MPA catheter was exchanged over an Amplatz wire for serial vascular dilators which allow placement of a peel-away sheath, through which the catheter was advanced under intermittent fluoroscopy, positioned with its tips in the proximal and midright atrium. Spot chest radiograph confirms good catheter position. No pneumothorax. Catheter was flushed and primed per protocol. Catheter secured externally with O Prolene sutures. The right IJ dermatotomy site was closed with Dermabond. COMPLICATIONS: COMPLICATIONS None immediate FLUOROSCOPY TIME:  0.2 minutes; 29 uGym2 DAP COMPARISON:  None IMPRESSION: 1. Technically successful placement of tunneled right IJ hemodialysis catheter with ultrasound and fluoroscopic guidance. Ready for routine use. ACCESS: Remains approachable for percutaneous intervention as needed. Electronically Signed   By: Lucrezia Europe M.D.   On: 08/23/2019 15:18   IR US Guide Vasc Access Right  Result Date: 08/23/2019 CLINICAL DATA:  Renal failure. Needs durable venous access for hemodialysis. EXAM: TUNNELED HEMODIALYSIS  CATHETER PLACEMENT WITH ULTRASOUND AND FLUOROSCOPIC GUIDANCE TECHNIQUE: The procedure, risks, benefits, and alternatives were explained to the patient. Questions regarding the procedure were encouraged and answered. The patient understands and consents to the procedure. As antibiotic prophylaxis, cefazolin 2 g was ordered pre-procedure and administered intravenously within one hour  of incision. Patency of the right IJ vein was confirmed with ultrasound with image documentation. An appropriate skin site was determined. Region was prepped using maximum barrier technique including cap and mask, sterile gown, sterile gloves, large sterile sheet, and Chlorhexidine as cutaneous antisepsis. The region was infiltrated locally with 1% lidocaine. Intravenous Fentanyl 19mcg and Versed 0.5mg  were administered as conscious sedation during continuous monitoring of the patient's level of consciousness and physiological / cardiorespiratory status by the radiology RN, with a total moderate sedation time of 13 minutes. Under real-time ultrasound guidance, the right IJ vein was accessed with a 21 gauge micropuncture needle; the needle tip within the vein was confirmed with ultrasound image documentation. Needle exchanged over the 018 guidewire for transitional dilator, which allowed advancement of a Benson wire into the IVC. Over this, an MPA catheter was advanced. A Palindrome 19 hemodialysis catheter was tunneled from the right anterior chest wall approach to the right IJ dermatotomy site. The MPA catheter was exchanged over an Amplatz wire for serial vascular dilators which allow placement of a peel-away sheath, through which the catheter was advanced under intermittent fluoroscopy, positioned with its tips in the proximal and midright atrium. Spot chest radiograph confirms good catheter position. No pneumothorax. Catheter was flushed and primed per protocol. Catheter secured externally with O Prolene sutures. The right IJ dermatotomy site was closed with Dermabond. COMPLICATIONS: COMPLICATIONS None immediate FLUOROSCOPY TIME:  0.2 minutes; 29 uGym2 DAP COMPARISON:  None IMPRESSION: 1. Technically successful placement of tunneled right IJ hemodialysis catheter with ultrasound and fluoroscopic guidance. Ready for routine use. ACCESS: Remains approachable for percutaneous intervention as needed. Electronically  Signed   By: Lucrezia Europe M.D.   On: 08/23/2019 15:18   DG CHEST PORT 1 VIEW  Result Date: 09/01/2019 CLINICAL DATA:  Shortness of breath EXAM: PORTABLE CHEST 1 VIEW COMPARISON:  August 18, 2019 chest radiograph and chest CT July 07, 2019 FINDINGS: There is scarring in the right base. There is no edema or airspace opacity. Heart is mildly enlarged with pulmonary vascularity normal. Patient is status post median sternotomy. Pacemaker lead attached to right ventricle, stable. Central catheter tip in superior vena cava. No pneumothorax. No bone lesions. IMPRESSION: Scarring right base. Lungs otherwise clear. Stable cardiac silhouette. Postoperative changes noted. Central catheter tip in superior vena cava. Electronically Signed   By: Lowella Grip III M.D.   On: 09/01/2019 10:03   DG CHEST PORT 1 VIEW  Result Date: 08/18/2019 CLINICAL DATA:  PICC line placement. EXAM: PORTABLE CHEST 1 VIEW COMPARISON:  August 16, 2019. FINDINGS: Stable cardiomegaly. Atherosclerosis of thoracic aorta is noted. Left-sided pacemaker is unchanged in position. Interval placement of right-sided PICC line with distal tip in expected position of the SVC. No pneumothorax or pleural effusion is noted. Lungs are clear. Bony thorax is unremarkable. IMPRESSION: No acute cardiopulmonary abnormality seen. Interval placement of right-sided PICC line with distal tip in expected position of the SVC. Aortic Atherosclerosis (ICD10-I70.0). Electronically Signed   By: Marijo Conception M.D.   On: 08/18/2019 10:31   [place] EKG SITE RITE  Result Date: 08/25/2019 If Site Rite image not attached, placement could not be confirmed due to current cardiac rhythm.  [place]  EKG SITE RITE  Result Date: 08/17/2019 If Site Rite image not attached, placement could not be confirmed due to current cardiac rhythm.   ASSESSMENT AND PLAN:  This is a very pleasant 86 year old white male who is followed by hematology for persistent anemia likely due to iron  deficiency in addition to ITP.  The patient has been on anticoagulation initially with Eliquis and then was on heparin for about 1 week and then transition back to Eliquis.  Anticoagulation has now been stopped secondary to thrombocytopenia.  The patient has been on iron supplementation for his anemia.  The patient's platelet count has been slowly dropping during his hospitalization.  This is likely due to his ITP.  Recommend platelet transfusion for active bleeding.  Given the slow oozing of blood from his HD catheter site, it would be reasonable to give him a unit of platelets this evening after dialysis if the bleeding persists. We will start the patient on prednisone 1 mg/kg for ITP.  I have also requested for pathology review of peripheral blood smear, LDH, and reticulocytes with next lab draw.  Monitor CBC with differential daily.  Thank you for this referral.  Mikey Bussing, DNP, AGPCNP-BC, AOCNP Mon/Tues/Thurs/Fri 7am-5pm; Off Wednesdays Cell: [phone]  ADDENDUM: Hematology/Oncology Attending: The patient is seen and examined today.  I agree with the above note.  This is a very pleasant 86 years old white male with multiple medical problems including end-stage renal disease as well as congestive heart failure.  He was seen in the clinic few months ago with iron deficiency anemia as well as thrombocytopenia that was felt at that time to be ITP.  He had extensive blood work at that time including von Willebrand as well as hepatitis and HIV panel that were unremarkable. The patient had several admission to the hospital recently with congestive heart failure and shortness of breath.  During this admission he was noted to have platelets count down to 20,000.  He has some oozing from the dialysis catheter site. He was also on heparin initially but only now for the flush. I would definitely recommend for him to discontinue any heparin product at this point just for the concern of possibility of  heparin-induced thrombocytopenia.  We will order a HIT panel. I recommended for the patient to start prednisone 1 mg/kg for the suspicious ITP.  This may take several days to work. The patient should also be considered for platelet transfusion for any platelet count less than 20,000 or bleeding issues. I will continue to monitor his blood count closely and provide recommendation as needed. Thank you for allowing me to participate in the care of Ricardo Payne.  I will continue to follow up the patient with you and assist in his management as needed.  Disclaimer: This note was dictated with voice recognition software. Similar sounding words can inadvertently be transcribed and may be missed upon review. Eilleen Kempf, MD

## 2019-09-04 NOTE — Progress Notes (Signed)
Patient returned to unit from dialysis Vitals are as follows:   09/04/19 1544  Vitals  Temp 98.3 F (36.8 C)  Temp Source Oral  BP (!) 100/58  MAP (mmHg) 70  BP Location Left Arm  BP Method Automatic  Patient Position (if appropriate) Lying  Pulse Rate 78  Pulse Rate Source Monitor  Resp 16  Level of Consciousness  Level of Consciousness Alert  Oxygen Therapy  SpO2 97 %  O2 Device Nasal Cannula  O2 Flow Rate (L/min) 2 L/min  MEWS Score  MEWS Temp 0  MEWS Systolic 1  MEWS Pulse 0  MEWS RR 0  MEWS LOC 0  MEWS Score 1  MEWS Score Color Green   Patient denies any pain or discomfort, is lying in bed with television on. CVP is 20 upon arrival back to unit

## 2019-09-04 NOTE — Progress Notes (Addendum)
Patient ID: Ricardo Payne, male   DOB: 11-Feb-1931, 86 y.o.   MRN: [id number]     Advanced Heart Failure Rounding Note  PCP-Cardiologist: Donato Heinz, MD   Subjective:    CVP 31  HD 4/5 tolerated.   Platelets trending down 31>21   Denies SOB.   Objective:   Weight Range: 98 kg Body mass index is 34.88 kg/m.   Vital Signs:   Temp:  [98.1 F (36.7 C)-98.7 F (37.1 C)] 98.3 F (36.8 C) (04/06 0808) Pulse Rate:  [70-80] 71 (04/06 0808) Resp:  [15-25] 18 (04/06 0808) BP: (97-114)/(51-73) 97/65 (04/06 0808) SpO2:  [95 %-100 %] 98 % (04/06 0808) Weight:  [98 kg-101.3 kg] 98 kg (04/06 0102) Last BM Date: 09/02/19  Weight change: Filed Weights   09/03/19 1520 09/03/19 1801 09/04/19 0102  Weight: 101.3 kg 99 kg 98 kg    Intake/Output:   Intake/Output Summary (Last 24 hours) at 09/04/2019 0815 Last data filed at 09/04/2019 0433 Gross per 24 hour  Intake 1040 ml  Output 2250 ml  Net -1210 ml      Physical Exam  CVP 31 General: In bed.  No resp difficulty HEENT: normal Neck: supple. JVP to jaw.  Carotids 2+ bilat; no bruits. No lymphadenopathy or thryomegaly appreciated. Cor: PMI nondisplaced. Regular rate & rhythm. No rubs, gallops or murmurs. R upper chest tunneled HD catheter.  Lungs: clear Abdomen: soft, nontender, nondistended. No hepatosplenomegaly. No bruits or masses. Good bowel sounds. Extremities: no cyanosis, clubbing, rash, edema. RUE PICC oozing around insertion site.   Neuro: alert & orientedx3, cranial nerves grossly intact. moves all 4 extremities w/o difficulty. Affect pleasant    Telemetry  NSR 70-80s    Labs    CBC Recent Labs    09/03/19 0433 09/04/19 0445  WBC 6.6 6.7  HGB 9.7* 9.7*  HCT 31.6* 31.6*  MCV 98.8 98.4  PLT 31* 21*   Basic Metabolic Panel Recent Labs    09/03/19 0433 09/04/19 0445  NA 140 138  K 3.9 4.4  CL 101 100  CO2 27 27  GLUCOSE 166* 146*  BUN 30* 22  CREATININE 3.62* 3.01*  CALCIUM 8.7*  8.6*  PHOS  --  3.4   Liver Function Tests Recent Labs    09/04/19 0445  ALBUMIN 3.3*   No results for input(s): LIPASE, AMYLASE in the last 72 hours. Cardiac Enzymes No results for input(s): CKTOTAL, CKMB, CKMBINDEX, TROPONINI in the last 72 hours.  BNP: BNP (last 3 results) Recent Labs    08/13/19 1437 08/16/19 1659 08/17/19 0714  BNP 1,224.9* 1,081.1* 1,056.0*    ProBNP (last 3 results) Recent Labs    05/21/19 1535  PROBNP 6,191*     D-Dimer No results for input(s): DDIMER in the last 72 hours. Hemoglobin A1C No results for input(s): HGBA1C in the last 72 hours. Fasting Lipid Panel No results for input(s): CHOL, HDL, LDLCALC, TRIG, CHOLHDL, LDLDIRECT in the last 72 hours. Thyroid Function Tests No results for input(s): TSH, T4TOTAL, T3FREE, THYROIDAB in the last 72 hours.  Invalid input(s): FREET3  Other results:   Imaging    No results found.   Medications:     Scheduled Medications: . allopurinol  50 mg Oral QODAY  . amiodarone  200 mg Oral Daily  . apixaban  2.5 mg Oral BID  . Chlorhexidine Gluconate Cloth  6 each Topical Q0600  . docusate sodium  200 mg Oral Daily  . folic acid-pyridoxine-cyancobalamin  1 tablet Oral  Daily  . melatonin  6 mg Oral QHS  . midodrine  15 mg Oral TID with meals  . multivitamin  1 tablet Oral QHS  . polyethylene glycol  17 g Oral Daily  . pramipexole  0.25 mg Oral BID  . rosuvastatin  10 mg Oral q1800  . sevelamer carbonate  2,400 mg Oral TID WC  . sodium chloride flush  10-40 mL Intracatheter Q12H  . sodium chloride flush  3 mL Intravenous Q12H  . tamsulosin  0.4 mg Oral Daily    Infusions: . sodium chloride Stopped (08/19/19 1323)    PRN Medications: sodium chloride, acetaminophen, HYDROmorphone (DILAUDID) injection, sodium chloride flush, sodium chloride flush   Assessment/Plan   1. Acute on chronic systolic CHF: Ischemic cardiomyopathy.  St Jude ICD.  Echo in 2/21 with EF <20%, mildly dilated RV  with moderately decreased systolic function, mild AS, mild-moderate MR, severe TR.  CHF is complicated by cardiorenal syndrome and recurrent atrial fibrillation with RVR.  Suspect low output HF.  He was on milrinone but now off.  - Maintaining NSR after DCCV.  - Started iHD 3/25.  - Remains markedly volume overloaded with CVP 31 despite several weeks of HD -CO-OX 54% - He has end-stage systolic HF with marked volume overload and recent development of ESRD.  - Volume removal has been limited by low BP despite midodrine 15 tid - - Planning HD for 3 days in a row this week (M,Tu, W) to see if we can get CVP < 15 with consistent HD.  2. CAD: S/p remote CABG (1989).  He does not think he has had a coronary evaluation since that time.  No chest pain.  HS-TnI not elevated.  - No s/s ischemia.   - Continue Crestor.  - No ASA given anticoagulation.  3. Atrial fibrillation: Paroxysmal.  He was admitted with atrial fibrillation with RVR.  This may have triggered his CHF exacerbation (though based on device interrogation, worsening volume status does not seem to necessarily track with atrial fibrillation) or may be a consequence of the exacerbation.  TEE-guided DCCV done 3/24,  - Remains in NSR  - Continue amio 200 mg daily.   - Hold eliquis.  4. AKI on CKD stage 4-> ESRD:  - see above. Plan for HD today  5. PAD: Patient reports history of peripheral intervention though I do not see this is in history.  6. CVA: In past. Likely related to atrial fibrillation.  - continue on Eliquis for secondary prevention  7. Thrombocytopenia Plts on admit 75 K. PLTs trending down 35>24>>32 > 30k >31>21 K - Consult hematology.  Heparin stopped 08/28/19   - Hold eliquis with low platelets.   - Oozing around PICC.   8. Deconditioning - PT recommending HHPT   Length of Stay: [place], NP  09/04/2019, 8:15 AM  Advanced Heart Failure Team Pager [phone] (M-F; 7a - 4p)   Patient seen with NP, agree with the  above note.   He remains volume overloaded on exam with CVP 31 today.  However, he is walking in hall without much difficulty.  He remains in NSR.  Suspect significant RV dysfunction with severe TR. SBP stable in 100s range.   He has started daily HD with goal to try to push down CVP to 15 range if possible.  Only able to dialyze 2 hrs yesterday due to scheduling issues but weight down 2 lbs.  Discussed with renal, will try to dialyze longer today.  Continue midodrine to maintain BP.   Continue amiodarone 200 mg daily to maintain NSR.   He has low plts (pre-existed admission but now down to 21K from 30s range).  No heparin products.  With oozing at HD line site, will now need to hold apixaban.  - Hold apixaban for now with falling plts and line site oozing.  - Will consult hematology for assistance.    Loralie Champagne 09/04/2019 8:37 AM

## 2019-09-04 NOTE — Progress Notes (Deleted)
This morning the pt complained of bloody sputum x2 last night. Pharmacy informed by this RN when they called this morning. No Heparin bolus order, but rate increased. Will continue to monitor.

## 2019-09-04 NOTE — Progress Notes (Signed)
Physical Therapy Treatment Patient Details Name: Ricardo Payne MRN: [id number] DOB: 1930/09/06 Today's Date: 09/04/2019    History of Present Illness Patient is a 86 y/o male who presents with LE swelling and SOB. CXR-pulmonary edema. Admitted with Acute Decompensated Systolic HF. PMH includes CKD, ischemic heart disease, HTN, PAF, HLD, ICD, CAD s/p CABG, two recent admission for ADHF and volume overload (06/2019, 07/2019).    PT Comments    Pt making good progress with therapy.   His goals have been met and updated.  Pt required min cues for safety, breathing technique, and energy conservation with walking.  Able to ambulate 350' without AD and with 2 rest breaks. Cont to advance as able.    Follow Up Recommendations  Home health PT     Equipment Recommendations  None recommended by PT(has DME)    Recommendations for Other Services       Precautions / Restrictions Precautions Precautions: Fall Precaution Comments: watch HR, A-fib    Mobility  Bed Mobility Overal bed mobility: Modified Independent Bed Mobility: Supine to Sit;Sit to Supine     Supine to sit: Modified independent (Device/Increase time);HOB elevated Sit to supine: Modified independent (Device/Increase time);HOB elevated      Transfers Overall transfer level: Needs assistance Equipment used: None Transfers: Sit to/from Stand Sit to Stand: Modified independent (Device/Increase time)         General transfer comment: min cues for safety with lines/leads  Ambulation/Gait Ambulation/Gait assistance: Supervision Gait Distance (Feet): 350 Feet Assistive device: None Gait Pattern/deviations: Step-through pattern     General Gait Details: Pt ambulated on RA with SpO2 >96%; BP and HR stable; Pt required 2 standing rest breaks and cues for breathing technique and pace management (pt ambulating at fast pace, cued to slow for safety and energy conservation)   Stairs             Wheelchair  Mobility    Modified Rankin (Stroke Patients Only)       Balance Overall balance assessment: Needs assistance Sitting-balance support: Feet supported;No upper extremity supported Sitting balance-Leahy Scale: Normal     Standing balance support: During functional activity;No upper extremity supported Standing balance-Leahy Scale: Good                              Cognition Arousal/Alertness: Awake/alert Behavior During Therapy: WFL for tasks assessed/performed Overall Cognitive Status: Within Functional Limits for tasks assessed                                        Exercises      General Comments        Pertinent Vitals/Pain Pain Assessment: No/denies pain    Home Living                      Prior Function            PT Goals (current goals can now be found in the care plan section) Acute Rehab PT Goals Patient Stated Goal: pt reports either going home or hospice at d/c pending progress PT Goal Formulation: With patient Time For Goal Achievement: 09/18/19 Potential to Achieve Goals: Good Progress towards PT goals: Goals met and updated - see care plan    Frequency    Min 3X/week      PT Plan Current plan remains  appropriate    Co-evaluation              AM-PAC PT "6 Clicks" Mobility   Outcome Measure  Help needed turning from your back to your side while in a flat bed without using bedrails?: None Help needed moving from lying on your back to sitting on the side of a flat bed without using bedrails?: None Help needed moving to and from a bed to a chair (including a wheelchair)?: None Help needed standing up from a chair using your arms (e.g., wheelchair or bedside chair)?: None Help needed to walk in hospital room?: None Help needed climbing 3-5 steps with a railing? : A Little 6 Click Score: 23    End of Session   Activity Tolerance: Patient tolerated treatment well Patient left: in bed;with call  bell/phone within reach(sitting EOB to eat) Nurse Communication: Mobility status PT Visit Diagnosis: Difficulty in walking, not elsewhere classified (R26.2)     Time: 1704-1724 PT Time Calculation (min) (ACUTE ONLY): 20 min  Charges:  $Gait Training: 8-22 mins                     Dacia Benton, PT Acute Rehab Services Pager [phone] [facility] [phone] [facility] [phone]    Dacia H Benton 09/04/2019, 5:44 PM   

## 2019-09-05 DIAGNOSIS — Z79899 Other long term (current) drug therapy: Secondary | ICD-10-CM

## 2019-09-05 DIAGNOSIS — Z7901 Long term (current) use of anticoagulants: Secondary | ICD-10-CM

## 2019-09-05 DIAGNOSIS — D509 Iron deficiency anemia, unspecified: Secondary | ICD-10-CM

## 2019-09-05 DIAGNOSIS — R609 Edema, unspecified: Secondary | ICD-10-CM

## 2019-09-05 DIAGNOSIS — D693 Immune thrombocytopenic purpura: Secondary | ICD-10-CM

## 2019-09-05 DIAGNOSIS — R0602 Shortness of breath: Secondary | ICD-10-CM

## 2019-09-05 LAB — RENAL FUNCTION PANEL
Albumin: 3.1 g/dL — ABNORMAL LOW (ref 3.5–5.0)
Anion gap: 9 (ref 5–15)
BUN: 22 mg/dL (ref 8–23)
CO2: 28 mmol/L (ref 22–32)
Calcium: 8.5 mg/dL — ABNORMAL LOW (ref 8.9–10.3)
Chloride: 99 mmol/L (ref 98–111)
Creatinine, Ser: 2.71 mg/dL — ABNORMAL HIGH (ref 0.61–1.24)
GFR calc Af Amer: 24 mL/min — ABNORMAL LOW (ref >60)
GFR calc non Af Amer: 20 mL/min — ABNORMAL LOW (ref >60)
Glucose, Bld: 145 mg/dL — ABNORMAL HIGH (ref 70–99)
Phosphorus: 3.7 mg/dL (ref 2.5–4.6)
Potassium: 4.4 mmol/L (ref 3.5–5.1)
Sodium: 136 mmol/L (ref 135–145)

## 2019-09-05 LAB — CBC
HCT: 29.8 % — ABNORMAL LOW (ref 39.0–52.0)
Hemoglobin: 9.1 g/dL — ABNORMAL LOW (ref 13.0–17.0)
MCH: 30.3 pg (ref 26.0–34.0)
MCHC: 30.5 g/dL (ref 30.0–36.0)
MCV: 99.3 fL (ref 80.0–100.0)
Platelets: 17 10*3/uL — CL (ref 150–400)
RBC: 3 MIL/uL — ABNORMAL LOW (ref 4.22–5.81)
RDW: 16.7 % — ABNORMAL HIGH (ref 11.5–15.5)
WBC: 5.8 10*3/uL (ref 4.0–10.5)
nRBC: 0 % (ref 0.0–0.2)

## 2019-09-05 LAB — IRON AND TIBC
Iron: 42 ug/dL — ABNORMAL LOW (ref 45–182)
Saturation Ratios: 12 % — ABNORMAL LOW (ref 17.9–39.5)
TIBC: 357 ug/dL (ref 250–450)
UIBC: 315 ug/dL

## 2019-09-05 LAB — COOXEMETRY PANEL
Carboxyhemoglobin: 1.5 % (ref 0.5–1.5)
Methemoglobin: 0.4 % (ref 0.0–1.5)
O2 Saturation: 61.6 %
Total hemoglobin: 9 g/dL — ABNORMAL LOW (ref 12.0–16.0)

## 2019-09-05 LAB — FERRITIN: Ferritin: 73 ng/mL (ref 24–336)

## 2019-09-05 MED ORDER — SODIUM CHLORIDE 0.9 % IV SOLN
125.0000 mg | INTRAVENOUS | Status: DC
  Administered 2019-09-07 – 2019-09-10 (×2): 125 mg via INTRAVENOUS
  Filled 2019-09-05 (×4): qty 10

## 2019-09-05 NOTE — Progress Notes (Addendum)
[facility] NEPHROLOGY PROGRESS NOTE  Assessment/ Plan: Pt is a 86 y.o. yo male with ICM, EF less than 20%, dilated RV, CAD, AKI on CKD now dialysis dependent.  #Acute kidney injury on CKD stage IV due to cardiorenal syndrome: Started dialysis on 3/26.  Seen by vascular who placed tunneled dialysis catheter, no plan for permanent access because of multiple comorbidities and technical difficulties.  Cardiology note noted.  Receiving serial dialysis to optimize volume status. Had HD yesterday with 2L UF, third consecutive HD today.  He has been tolerating well. SW consulted to arrange for OP HD unit.  #Acute on chronic CHF: Did not respond with inotropic support and diuretics.  EF is very low.  CVP 31 per cardiology.  Now volume status managed with dialysis as discussed above.  Cardiology following.  # Anemia of CKD: Saturation 12%, order IV iron.  Monitor Hb.  # Secondary hyperparathyroidism: Phosphorus at goal.  Continue Renvela.  # Hypotension/volume; monitor blood pressure.  UF during HD.  On midodrine during HD.  #Thrombocytopenia likely ITP per oncology.  On prednisone now.  Subjective: Seen and examined.  Feeling good.  He has been tolerating dialysis with UF around 2 kg this time.  He denies nausea vomiting chest pain or shortness of breath.  Objective Vital signs in last 24 hours: Vitals:   09/05/19 0300 09/05/19 0406 09/05/19 0409 09/05/19 0758  BP:  104/63  107/60  Pulse:  67  71  Resp: 20 17  19   Temp:  98.4 F (36.9 C)  98.4 F (36.9 C)  TempSrc:  Oral  Oral  SpO2: 98% 98%  100%  Weight:   98 kg   Height:       Weight change: -0.5 kg  Intake/Output Summary (Last 24 hours) at 09/05/2019 0826 Last data filed at 09/05/2019 0440 Gross per 24 hour  Intake 946 ml  Output 2151 ml  Net -1205 ml       Labs: Basic Metabolic Panel: Recent Labs  Lab 09/01/19 0500 09/01/19 0500 09/03/19 0433 09/04/19 0445 09/05/19 0356  NA 136   < > 140 138 136  K  3.9   < > 3.9 4.4 4.4  CL 97*   < > 101 100 99  CO2 27   < > 27 27 28   GLUCOSE 164*   < > 166* 146* 145*  BUN 28*   < > 30* 22 22  CREATININE 3.41*   < > 3.62* 3.01* 2.71*  CALCIUM 8.8*   < > 8.7* 8.6* 8.5*  PHOS 4.3  --   --  3.4 3.7   < > = values in this interval not displayed.   Liver Function Tests: Recent Labs  Lab 09/01/19 0500 09/04/19 0445 09/05/19 0356  ALBUMIN 3.3* 3.3* 3.1*   No results for input(s): LIPASE, AMYLASE in the last 168 hours. No results for input(s): AMMONIA in the last 168 hours. CBC: Recent Labs  Lab 09/01/19 0500 09/01/19 0500 09/02/19 0452 09/02/19 0452 09/03/19 0433 09/04/19 0445 09/05/19 0356  WBC 5.3   < > 5.9   < > 6.6 6.7 5.8  HGB 9.7*   < > 9.7*   < > 9.7* 9.7* 9.1*  HCT 31.2*   < > 31.6*   < > 31.6* 31.6* 29.8*  MCV 96.0  --  97.2  --  98.8 98.4 99.3  PLT 30*   < > PLATELET CLUMPS NOTED ON SMEAR, UNABLE TO ESTIMATE   < > 31* 21* 17*   < > =  values in this interval not displayed.   Cardiac Enzymes: No results for input(s): CKTOTAL, CKMB, CKMBINDEX, TROPONINI in the last 168 hours. CBG: No results for input(s): GLUCAP in the last 168 hours.  Iron Studies:  Recent Labs    09/05/19 0356  IRON 42*  TIBC 357  FERRITIN 73   Studies/Results: No results found.  Medications: Infusions: . sodium chloride Stopped (08/19/19 1323)    Scheduled Medications: . allopurinol  50 mg Oral QODAY  . amiodarone  200 mg Oral Daily  . Chlorhexidine Gluconate Cloth  6 each Topical Q0600  . docusate sodium  200 mg Oral Daily  . folic acid-pyridoxine-cyancobalamin  1 tablet Oral Daily  . melatonin  6 mg Oral QHS  . midodrine  15 mg Oral TID with meals  . multivitamin  1 tablet Oral QHS  . polyethylene glycol  17 g Oral Daily  . pramipexole  0.25 mg Oral BID  . predniSONE  100 mg Oral Q breakfast  . rosuvastatin  10 mg Oral q1800  . sevelamer carbonate  2,400 mg Oral TID WC  . sodium chloride flush  10-40 mL Intracatheter Q12H  . sodium  chloride flush  3 mL Intravenous Q12H  . tamsulosin  0.4 mg Oral Daily    have reviewed scheduled and prn medications.  Physical Exam: General:NAD, sitting on bed comfortable Heart:RRR, s1s2 nl, no rubs Lungs:clear b/l, no crackle Abdomen:soft, Non-tender, non-distended Extremities: Trace LE edema Dialysis Access: Right IJ TDC.  Dron Tanna Furry 09/05/2019,8:26 AM  LOS: 20 days  Pager: [phone]

## 2019-09-05 NOTE — Progress Notes (Signed)
This RN received a call from hemodialysis RN requesting report on patient. Handoff report was given. Melissa, RN with hemodialysis states to give patient midodrine before patient leaves unit for hemodialysis. Midodrine 15 mg PO was given. Patient currently leaving unit for hemodialysis via transportation services.

## 2019-09-05 NOTE — Progress Notes (Signed)
Physical Therapy Treatment Patient Details Name: Ricardo Payne MRN: [id number] DOB: 09/21/1930 Today's Date: 09/05/2019    History of Present Illness Patient is a 86 y/o male who presents with LE swelling and SOB. CXR-pulmonary edema. Admitted with Acute Decompensated Systolic HF. PMH includes CKD, ischemic heart disease, HTN, PAF, HLD, ICD, CAD s/p CABG, two recent admission for ADHF and volume overload (06/2019, 07/2019).    PT Comments    Pt initially declining therapy, stating he had already been up with mobility tech this AM. Pt agreeable to participate after some encouragement and education. Pt ambulated in hallway with 1 rest brake and cues for pursed lipped breathing. On return to room pt performed exercises in chair and educated on benefits for sitting upright and elevating LEs. Will continue to follow acutely for mobility progression.     Follow Up Recommendations  Home health PT     Equipment Recommendations  None recommended by PT(has DME)    Recommendations for Other Services       Precautions / Restrictions Precautions Precautions: Fall Precaution Comments: watch HR, A-fib Restrictions Weight Bearing Restrictions: No    Mobility  Bed Mobility Overal bed mobility: Modified Independent Bed Mobility: Supine to Sit     Supine to sit: Modified independent (Device/Increase time);HOB elevated     General bed mobility comments: use of rail and increased time  Transfers Overall transfer level: Modified independent Equipment used: None Transfers: Sit to/from Stand Sit to Stand: Modified independent (Device/Increase time)            Ambulation/Gait Ambulation/Gait assistance: Supervision Gait Distance (Feet): 200 Feet Assistive device: None Gait Pattern/deviations: Step-through pattern;Wide base of support;Decreased stride length Gait velocity: decreased   General Gait Details: Pt ambualted on RA with SpO2 dropping briefly to 89% but returned quickly to  >95% with short standing break and cues for pursed lipped breathing. Wide BOS and decreased knee flexion. Pt reports some scrotal swelling attributing to gait deviations.   Stairs             Wheelchair Mobility    Modified Rankin (Stroke Patients Only)       Balance Overall balance assessment: Needs assistance Sitting-balance support: Feet supported;No upper extremity supported Sitting balance-Leahy Scale: Normal     Standing balance support: During functional activity;No upper extremity supported Standing balance-Leahy Scale: Good                              Cognition Arousal/Alertness: Awake/alert Behavior During Therapy: WFL for tasks assessed/performed Overall Cognitive Status: Within Functional Limits for tasks assessed                                 General Comments: for basic mobility tasks      Exercises General Exercises - Lower Extremity Ankle Circles/Pumps: AROM;Both;10 reps;Seated Heel Slides: AROM;Both;10 reps;Seated    General Comments        Pertinent Vitals/Pain Pain Assessment: No/denies pain    Home Living                      Prior Function            PT Goals (current goals can now be found in the care plan section) Acute Rehab PT Goals Patient Stated Goal: pt reports either going home or hospice at d/c pending progress PT Goal Formulation: With patient  Time For Goal Achievement: 09/18/19 Potential to Achieve Goals: Good Progress towards PT goals: Progressing toward goals    Frequency    Min 3X/week      PT Plan Current plan remains appropriate    Co-evaluation              AM-PAC PT "6 Clicks" Mobility   Outcome Measure  Help needed turning from your back to your side while in a flat bed without using bedrails?: None Help needed moving from lying on your back to sitting on the side of a flat bed without using bedrails?: None Help needed moving to and from a bed to a chair  (including a wheelchair)?: None Help needed standing up from a chair using your arms (e.g., wheelchair or bedside chair)?: None Help needed to walk in hospital room?: None Help needed climbing 3-5 steps with a railing? : A Little 6 Click Score: 23    End of Session Equipment Utilized During Treatment: Gait belt Activity Tolerance: Patient tolerated treatment well Patient left: with call bell/phone within reach;in chair(sitting EOB to eat) Nurse Communication: Mobility status PT Visit Diagnosis: Difficulty in walking, not elsewhere classified (R26.2)     Time: 2197-5883 PT Time Calculation (min) (ACUTE ONLY): 29 min  Charges:  $Gait Training: 8-22 mins $Therapeutic Activity: 8-22 mins                    Benjiman Core, [place] Pager [phone] Acute Rehab   Allena Katz 09/05/2019, 10:46 AM

## 2019-09-05 NOTE — Progress Notes (Signed)
Patient ID: Ricardo Payne, male   DOB: [birth year], 86 y.o.   MRN: [id number]     Advanced Heart Failure Rounding Note  PCP-Cardiologist: Donato Heinz, MD   Subjective:    HD yesterday with no reported problems.  Weight trending down.  CVP lower at 25.   Platelets trending down 31>21>17, patient seen by hematology and prednisone started. He is off apixaban, no further oozing at line site.   Denies SOB, has walked in hall.   Objective:   Weight Range: 98 kg Body mass index is 34.86 kg/m.   Vital Signs:   Temp:  [98.3 F (36.8 C)-98.8 F (37.1 C)] 98.4 F (36.9 C) (04/07 0758) Pulse Rate:  [67-89] 71 (04/07 0913) Resp:  [16-22] 18 (04/07 0913) BP: (86-112)/(38-63) 99/63 (04/07 0913) SpO2:  [95 %-100 %] 99 % (04/07 0913) Weight:  [98 kg-100.8 kg] 98 kg (04/07 0409) Last BM Date: 09/04/19  Weight change: Filed Weights   09/04/19 1130 09/04/19 1434 09/05/19 0409  Weight: 100.8 kg 98.5 kg 98 kg    Intake/Output:   Intake/Output Summary (Last 24 hours) at 09/05/2019 1021 Last data filed at 09/05/2019 0900 Gross per 24 hour  Intake 942 ml  Output 2150 ml  Net -1208 ml      Physical Exam  CVP 25 General: NAD Neck: JVP 16+ cm, no thyromegaly or thyroid nodule.  Lungs: Clear to auscultation bilaterally with normal respiratory effort. CV: Nondisplaced PMI.  Heart regular S1/S2, no S3/S4, 2/6 HSM LLSB.  Trace ankle edema.   Abdomen: Soft, nontender, no hepatosplenomegaly, no distention.  Skin: Intact without lesions or rashes.  Neurologic: Alert and oriented x 3.  Psych: Normal affect. Extremities: No clubbing or cyanosis.  HEENT: Normal.    Telemetry   NSR 70-80s (personally reviewed)   Labs    CBC Recent Labs    09/04/19 0445 09/05/19 0356  WBC 6.7 5.8  HGB 9.7* 9.1*  HCT 31.6* 29.8*  MCV 98.4 99.3  PLT 21* 17*   Basic Metabolic Panel Recent Labs    09/04/19 0445 09/05/19 0356  NA 138 136  K 4.4 4.4  CL 100 99  CO2 27 28  GLUCOSE  146* 145*  BUN 22 22  CREATININE 3.01* 2.71*  CALCIUM 8.6* 8.5*  PHOS 3.4 3.7   Liver Function Tests Recent Labs    09/04/19 0445 09/05/19 0356  ALBUMIN 3.3* 3.1*   No results for input(s): LIPASE, AMYLASE in the last 72 hours. Cardiac Enzymes No results for input(s): CKTOTAL, CKMB, CKMBINDEX, TROPONINI in the last 72 hours.  BNP: BNP (last 3 results) Recent Labs    08/13/19 1437 08/16/19 1659 08/17/19 0714  BNP 1,224.9* 1,081.1* 1,056.0*    ProBNP (last 3 results) Recent Labs    05/21/19 1535  PROBNP 6,191*     D-Dimer No results for input(s): DDIMER in the last 72 hours. Hemoglobin A1C No results for input(s): HGBA1C in the last 72 hours. Fasting Lipid Panel No results for input(s): CHOL, HDL, LDLCALC, TRIG, CHOLHDL, LDLDIRECT in the last 72 hours. Thyroid Function Tests No results for input(s): TSH, T4TOTAL, T3FREE, THYROIDAB in the last 72 hours.  Invalid input(s): FREET3  Other results:   Imaging    No results found.   Medications:     Scheduled Medications: . allopurinol  50 mg Oral QODAY  . amiodarone  200 mg Oral Daily  . Chlorhexidine Gluconate Cloth  6 each Topical Q0600  . docusate sodium  200 mg Oral Daily  .  folic acid-pyridoxine-cyancobalamin  1 tablet Oral Daily  . melatonin  6 mg Oral QHS  . midodrine  15 mg Oral TID with meals  . multivitamin  1 tablet Oral QHS  . polyethylene glycol  17 g Oral Daily  . pramipexole  0.25 mg Oral BID  . predniSONE  100 mg Oral Q breakfast  . rosuvastatin  10 mg Oral q1800  . sevelamer carbonate  2,400 mg Oral TID WC  . sodium chloride flush  10-40 mL Intracatheter Q12H  . sodium chloride flush  3 mL Intravenous Q12H  . tamsulosin  0.4 mg Oral Daily    Infusions: . sodium chloride Stopped (08/19/19 1323)  . ferric gluconate (FERRLECIT/NULECIT) IV      PRN Medications: sodium chloride, acetaminophen, HYDROmorphone (DILAUDID) injection, sodium chloride flush, sodium chloride  flush   Assessment/Plan   1. Acute on chronic systolic CHF: Ischemic cardiomyopathy.  St Jude ICD.  Echo in 2/21 with EF <20%, mildly dilated RV with moderately decreased systolic function, mild AS, mild-moderate MR, severe TR.  CHF is complicated by cardiorenal syndrome and recurrent atrial fibrillation with RVR.  Suspect low output HF.  He was on milrinone but now off.  Maintaining NSR. He has started daily HD with goal to try to push down CVP to 15 range if possible, tolerating without problem.  CVP still very high at 25 (but down from yesterday). Continue midodrine to maintain BP. Co-ox 62%.  - Will ask renal to try to push dialysis time to get more fluid off.   2. CAD: S/p remote CABG (1989).  He does not think he has had a coronary evaluation since that time.  No chest pain.  HS-TnI not elevated. No chest pain.   - Continue Crestor.  - No ASA given anticoagulation.  3. Atrial fibrillation: Paroxysmal.  He was admitted with atrial fibrillation with RVR.  This may have triggered his CHF exacerbation (though based on device interrogation, worsening volume status does not seem to necessarily track with atrial fibrillation) or may be a consequence of the exacerbation.  TEE-guided DCCV done 3/24, remains in NSR.  - Continue amio 200 mg daily.   - Hold Eliquis with very low plts (see below).   4. AKI on CKD stage 4-> ESRD.   - see above. Plan for HD again today  5. PAD: Patient reports history of peripheral intervention though I do not see this is in history.  6. CVA: In past. Likely related to atrial fibrillation.  - continue on Eliquis for secondary prevention when plts improved.   7. Thrombocytopenia: Plts on admit 75 K. History of suspected ITP, follows with hematology as outpatient.  PLTs trending down 35>24>>32 > 30k >31>21>17 K. Hematology has seen in consult, prednisone started. No overt bleeding, stable hgb.  - Continue prednisone per hematology.  - Transfuse plts < 10. - Stay off  heparin products, HIT sent.   - Restart Eliquis when able (need to see plts trending up). Will use SCDs for DVT prophylaxis.    8. Deconditioning - PT recommending HHPT  Length of Stay: 20  Loralie Champagne, MD  09/05/2019, 10:21 AM

## 2019-09-05 NOTE — Progress Notes (Addendum)
Patient returned to unit from hemodialysis. Current vitals are as follow:   09/05/19 1818  Vitals  Temp 98.5 F (36.9 C)  Temp Source Oral  BP (!) 108/55  MAP (mmHg) 71  BP Location Left Arm  BP Method Automatic  Patient Position (if appropriate) Lying  Pulse Rate 72  Pulse Rate Source Monitor  Resp 15  Level of Consciousness  Level of Consciousness Alert  Oxygen Therapy  SpO2 98 %  O2 Device Nasal Cannula  O2 Flow Rate (L/min) 2 L/min  MEWS Score  MEWS Temp 0  MEWS Systolic 0  MEWS Pulse 0  MEWS RR 0  MEWS LOC 0  MEWS Score 0  MEWS Score Color Green   Current CVP is 18 upon arrival back to unit.

## 2019-09-05 NOTE — Progress Notes (Signed)
Pt occasionally desats to 86% on room air. 2L [place] applied. O2 = 96%.

## 2019-09-05 NOTE — Progress Notes (Signed)
Pt weaned to room air. O2 sat = 92-98% while resting in bed.  Slow bleeding from HD cath and PICC line has reduced significantly. Will continue to monitor.

## 2019-09-05 NOTE — Progress Notes (Addendum)
Pt's platelets now down to 17. No significant bleeding from HD catheter or PICC line compared to beginning of this shift. MD paged about possible platelet transfusion per Hematology note.   MD paged back and informed this RN that Hematology will see the pt this morning.

## 2019-09-05 NOTE — Progress Notes (Signed)
Received call from nurse about platelet count down to 17k. Chart reviewed. Nurse reports the patient is no longer having evidence of bleeding or oozing (he was familiar with patient from the other day). I paged the on-call physician listed on Amion for heme/onc and spoke with Dr. Alvy Bimler. Heme-onc APP saw patient yesterday with suggestion to consider platelet transfusion. Dr. Alvy Bimler reports Dr. Julien Nordmann with will be staffing consult today and states that in general unless significant clinical bleeding they do not routinely recommend transfusion for platelet count over 10k. With each successive platelet transfusion comes cumulative effect of possibly making autoantibodies. Therefore she recommends to hold off. Appreciate heme's input today. Tanice Petre PA-C

## 2019-09-06 DIAGNOSIS — Z515 Encounter for palliative care: Secondary | ICD-10-CM | POA: Diagnosis not present

## 2019-09-06 DIAGNOSIS — N179 Acute kidney failure, unspecified: Secondary | ICD-10-CM | POA: Diagnosis not present

## 2019-09-06 DIAGNOSIS — D696 Thrombocytopenia, unspecified: Secondary | ICD-10-CM | POA: Diagnosis not present

## 2019-09-06 DIAGNOSIS — I509 Heart failure, unspecified: Secondary | ICD-10-CM | POA: Diagnosis not present

## 2019-09-06 LAB — CBC
HCT: 30.9 % — ABNORMAL LOW (ref 39.0–52.0)
Hemoglobin: 9.6 g/dL — ABNORMAL LOW (ref 13.0–17.0)
MCH: 30.8 pg (ref 26.0–34.0)
MCHC: 31.1 g/dL (ref 30.0–36.0)
MCV: 99 fL (ref 80.0–100.0)
Platelets: 18 10*3/uL — CL (ref 150–400)
RBC: 3.12 MIL/uL — ABNORMAL LOW (ref 4.22–5.81)
RDW: 16.7 % — ABNORMAL HIGH (ref 11.5–15.5)
WBC: 6.3 10*3/uL (ref 4.0–10.5)
nRBC: 0 % (ref 0.0–0.2)

## 2019-09-06 LAB — TYPE AND SCREEN
ABO/RH(D): O POS
Antibody Screen: NEGATIVE

## 2019-09-06 LAB — RENAL FUNCTION PANEL
Albumin: 3.2 g/dL — ABNORMAL LOW (ref 3.5–5.0)
Anion gap: 10 (ref 5–15)
BUN: 25 mg/dL — ABNORMAL HIGH (ref 8–23)
CO2: 26 mmol/L (ref 22–32)
Calcium: 8.6 mg/dL — ABNORMAL LOW (ref 8.9–10.3)
Chloride: 99 mmol/L (ref 98–111)
Creatinine, Ser: 2.79 mg/dL — ABNORMAL HIGH (ref 0.61–1.24)
GFR calc Af Amer: 23 mL/min — ABNORMAL LOW (ref >60)
GFR calc non Af Amer: 20 mL/min — ABNORMAL LOW (ref >60)
Glucose, Bld: 160 mg/dL — ABNORMAL HIGH (ref 70–99)
Phosphorus: 3.6 mg/dL (ref 2.5–4.6)
Potassium: 4.5 mmol/L (ref 3.5–5.1)
Sodium: 135 mmol/L (ref 135–145)

## 2019-09-06 LAB — ABO/RH: ABO/RH(D): O POS

## 2019-09-06 LAB — HEPARIN INDUCED PLATELET AB (HIT ANTIBODY): Heparin Induced Plt Ab: 0.121 OD (ref 0.000–0.400)

## 2019-09-06 LAB — COOXEMETRY PANEL
Carboxyhemoglobin: 1.4 % (ref 0.5–1.5)
Methemoglobin: 0.7 % (ref 0.0–1.5)
O2 Saturation: 54.7 %
Total hemoglobin: 9.5 g/dL — ABNORMAL LOW (ref 12.0–16.0)

## 2019-09-06 LAB — PARATHYROID HORMONE, INTACT (NO CA): PTH: 126 pg/mL — ABNORMAL HIGH (ref 15–65)

## 2019-09-06 MED ORDER — CHLORHEXIDINE GLUCONATE CLOTH 2 % EX PADS
6.0000 | MEDICATED_PAD | Freq: Every day | CUTANEOUS | Status: DC
  Administered 2019-09-09: 10:00:00 6 via TOPICAL

## 2019-09-06 MED ORDER — SODIUM CHLORIDE 0.9% IV SOLUTION: Freq: Once | INTRAVENOUS | Status: DC

## 2019-09-06 NOTE — Progress Notes (Signed)
HEMATOLOGY-ONCOLOGY PROGRESS NOTE  SUBJECTIVE: Feels well today. Eating lunch at the time my visit.  Still has some dried blood at his catheter site.  He has not noticed any epistaxis, hemoptysis, hematemesis, hematuria, melena, hematochezia.  REVIEW OF SYSTEMS:   Noncontributory except as noted in the HPI.  I have reviewed the past medical history, past surgical history, social history and family history with the patient and they are unchanged from previous note.  PHYSICAL EXAMINATION:  Vitals:   09/06/19 0833 09/06/19 1159  BP: (!) 118/56 (!) 99/55  Pulse: 72 73  Resp: 18 19  Temp: 98 F (36.7 C) 98 F (36.7 C)  SpO2: 100% 100%   Filed Weights   09/05/19 1425 09/05/19 1730 09/06/19 0056  Weight: 100.8 kg 96.7 kg 96.3 kg    Intake/Output from previous day: 04/07 0701 - 04/08 0700 In: 54 [P.O.:956] Out: 3200   GENERAL:alert, no distress and comfortable SKIN: Scattered ecchymoses over the bilateral upper extremities.  No petechiae.  Dried blood noted under HD catheter dressing. LUNGS: clear to auscultation and percussion with normal breathing effort HEART: regular rate & rhythm and no murmurs and trace ankle edema ABDOMEN:abdomen soft, non-tender and normal bowel sounds Musculoskeletal:no cyanosis of digits and no clubbing  NEURO: alert & oriented x 3 with fluent speech, no focal motor/sensory deficits  LABORATORY DATA:  I have reviewed the data as listed CMP Latest Ref Rng & Units 09/06/2019 09/05/2019 09/04/2019  Glucose 70 - 99 mg/dL 160(H) 145(H) 146(H)  BUN 8 - 23 mg/dL 25(H) 22 22  Creatinine 0.61 - 1.24 mg/dL 2.79(H) 2.71(H) 3.01(H)  Sodium 135 - 145 mmol/L 135 136 138  Potassium 3.5 - 5.1 mmol/L 4.5 4.4 4.4  Chloride 98 - 111 mmol/L 99 99 100  CO2 22 - 32 mmol/L 26 28 27   Calcium 8.9 - 10.3 mg/dL 8.6(L) 8.5(L) 8.6(L)  Total Protein 6.5 - 8.1 g/dL - - -  Total Bilirubin 0.3 - 1.2 mg/dL - - -  Alkaline Phos 38 - 126 U/L - - -  AST 15 - 41 U/L - - -  ALT 0 - 44  U/L - - -    Lab Results  Component Value Date   WBC 6.3 09/06/2019   HGB 9.6 (L) 09/06/2019   HCT 30.9 (L) 09/06/2019   MCV 99.0 09/06/2019   PLT 18 (LL) 09/06/2019   NEUTROABS 6.5 07/18/2019    DG Chest 2 View  Result Date: 08/16/2019 CLINICAL DATA:  Dyspnea. Fluid retention. Congestive heart failure with bilateral lower extremity and scrotal edema. EXAM: CHEST - 2 VIEW COMPARISON:  July 18, 2019 FINDINGS: Stable mild cardiomegaly with central vascular congestion and mild thickening of peripheral interlobular septa. No obvious pleural effusion. Redemonstration of mediastinal and upper abdominal surgical clips, intact median sternotomy wires, and single left ventricular lead cardiac defibrillator, similarly positioned with left-sided battery pack. Aortic knob calcified atherosclerosis. Streaky consolidation in the bilateral infrahilar regions, likely atelectatic. No pneumonia or pneumothorax. Skeletal degenerative changes and bony demineralization. IMPRESSION: Stable mild cardiomegaly with mild pulmonary edema and chronic thoracic postoperative changes. No pneumonia or obvious pleural effusion. Aortic calcified atherosclerosis. Electronically Signed   By: Revonda Humphrey   On: 08/16/2019 18:00   IR Fluoro Guide CV Line Right  Result Date: 08/23/2019 CLINICAL DATA:  Renal failure. Needs durable venous access for hemodialysis. EXAM: TUNNELED HEMODIALYSIS CATHETER PLACEMENT WITH ULTRASOUND AND FLUOROSCOPIC GUIDANCE TECHNIQUE: The procedure, risks, benefits, and alternatives were explained to the patient. Questions regarding the procedure were  encouraged and answered. The patient understands and consents to the procedure. As antibiotic prophylaxis, cefazolin 2 g was ordered pre-procedure and administered intravenously within one hour of incision. Patency of the right IJ vein was confirmed with ultrasound with image documentation. An appropriate skin site was determined. Region was prepped using  maximum barrier technique including cap and mask, sterile gown, sterile gloves, large sterile sheet, and Chlorhexidine as cutaneous antisepsis. The region was infiltrated locally with 1% lidocaine. Intravenous Fentanyl 41mcg and Versed 0.5mg  were administered as conscious sedation during continuous monitoring of the patient's level of consciousness and physiological / cardiorespiratory status by the radiology RN, with a total moderate sedation time of 13 minutes. Under real-time ultrasound guidance, the right IJ vein was accessed with a 21 gauge micropuncture needle; the needle tip within the vein was confirmed with ultrasound image documentation. Needle exchanged over the 018 guidewire for transitional dilator, which allowed advancement of a Benson wire into the IVC. Over this, an MPA catheter was advanced. A Palindrome 19 hemodialysis catheter was tunneled from the right anterior chest wall approach to the right IJ dermatotomy site. The MPA catheter was exchanged over an Amplatz wire for serial vascular dilators which allow placement of a peel-away sheath, through which the catheter was advanced under intermittent fluoroscopy, positioned with its tips in the proximal and midright atrium. Spot chest radiograph confirms good catheter position. No pneumothorax. Catheter was flushed and primed per protocol. Catheter secured externally with O Prolene sutures. The right IJ dermatotomy site was closed with Dermabond. COMPLICATIONS: COMPLICATIONS None immediate FLUOROSCOPY TIME:  0.2 minutes; 29 uGym2 DAP COMPARISON:  None IMPRESSION: 1. Technically successful placement of tunneled right IJ hemodialysis catheter with ultrasound and fluoroscopic guidance. Ready for routine use. ACCESS: Remains approachable for percutaneous intervention as needed. Electronically Signed   By: Lucrezia Europe M.D.   On: 08/23/2019 15:18   IR US Guide Vasc Access Right  Result Date: 08/23/2019 CLINICAL DATA:  Renal failure. Needs durable venous  access for hemodialysis. EXAM: TUNNELED HEMODIALYSIS CATHETER PLACEMENT WITH ULTRASOUND AND FLUOROSCOPIC GUIDANCE TECHNIQUE: The procedure, risks, benefits, and alternatives were explained to the patient. Questions regarding the procedure were encouraged and answered. The patient understands and consents to the procedure. As antibiotic prophylaxis, cefazolin 2 g was ordered pre-procedure and administered intravenously within one hour of incision. Patency of the right IJ vein was confirmed with ultrasound with image documentation. An appropriate skin site was determined. Region was prepped using maximum barrier technique including cap and mask, sterile gown, sterile gloves, large sterile sheet, and Chlorhexidine as cutaneous antisepsis. The region was infiltrated locally with 1% lidocaine. Intravenous Fentanyl 17mcg and Versed 0.5mg  were administered as conscious sedation during continuous monitoring of the patient's level of consciousness and physiological / cardiorespiratory status by the radiology RN, with a total moderate sedation time of 13 minutes. Under real-time ultrasound guidance, the right IJ vein was accessed with a 21 gauge micropuncture needle; the needle tip within the vein was confirmed with ultrasound image documentation. Needle exchanged over the 018 guidewire for transitional dilator, which allowed advancement of a Benson wire into the IVC. Over this, an MPA catheter was advanced. A Palindrome 19 hemodialysis catheter was tunneled from the right anterior chest wall approach to the right IJ dermatotomy site. The MPA catheter was exchanged over an Amplatz wire for serial vascular dilators which allow placement of a peel-away sheath, through which the catheter was advanced under intermittent fluoroscopy, positioned with its tips in the proximal and midright atrium. Spot  chest radiograph confirms good catheter position. No pneumothorax. Catheter was flushed and primed per protocol. Catheter secured  externally with O Prolene sutures. The right IJ dermatotomy site was closed with Dermabond. COMPLICATIONS: COMPLICATIONS None immediate FLUOROSCOPY TIME:  0.2 minutes; 29 uGym2 DAP COMPARISON:  None IMPRESSION: 1. Technically successful placement of tunneled right IJ hemodialysis catheter with ultrasound and fluoroscopic guidance. Ready for routine use. ACCESS: Remains approachable for percutaneous intervention as needed. Electronically Signed   By: Lucrezia Europe M.D.   On: 08/23/2019 15:18   DG CHEST PORT 1 VIEW  Result Date: 09/01/2019 CLINICAL DATA:  Shortness of breath EXAM: PORTABLE CHEST 1 VIEW COMPARISON:  August 18, 2019 chest radiograph and chest CT July 07, 2019 FINDINGS: There is scarring in the right base. There is no edema or airspace opacity. Heart is mildly enlarged with pulmonary vascularity normal. Patient is status post median sternotomy. Pacemaker lead attached to right ventricle, stable. Central catheter tip in superior vena cava. No pneumothorax. No bone lesions. IMPRESSION: Scarring right base. Lungs otherwise clear. Stable cardiac silhouette. Postoperative changes noted. Central catheter tip in superior vena cava. Electronically Signed   By: Lowella Grip III M.D.   On: 09/01/2019 10:03   DG CHEST PORT 1 VIEW  Result Date: 08/18/2019 CLINICAL DATA:  PICC line placement. EXAM: PORTABLE CHEST 1 VIEW COMPARISON:  August 16, 2019. FINDINGS: Stable cardiomegaly. Atherosclerosis of thoracic aorta is noted. Left-sided pacemaker is unchanged in position. Interval placement of right-sided PICC line with distal tip in expected position of the SVC. No pneumothorax or pleural effusion is noted. Lungs are clear. Bony thorax is unremarkable. IMPRESSION: No acute cardiopulmonary abnormality seen. Interval placement of right-sided PICC line with distal tip in expected position of the SVC. Aortic Atherosclerosis (ICD10-I70.0). Electronically Signed   By: Marijo Conception M.D.   On: 08/18/2019 10:31    ECHO TEE  Result Date: 09/05/2019    TRANSESOPHOGEAL ECHO REPORT   Patient Name:   Ricardo Payne Date of Exam: 08/22/2019 Medical Rec #:  [id number]               Height:       66.0 in Accession #:    [id number]              Weight:       224.0 lb Date of Birth:  December 17, 1930                BSA:          2.098 m Patient Age:    89 years                BP:           110/70 mmHg Patient Gender: M                       HR:           94 bpm. Exam Location:  Inpatient Procedure: Transesophageal Echo, Cardiac Doppler and Color Doppler Indications:     Atrial fibrillation  History:         Patient has prior history of Echocardiogram examinations, most                  recent 07/09/2019. CHF, CAD, Prior CABG, Stroke and PAD;                  Arrythmias:Atrial Fibrillation.  Sonographer:     Dustin Flock Referring Phys:  [id number] AMY D CLEGG Diagnosing Phys: Loralie Champagne MD PROCEDURE: The transesophogeal probe was passed without difficulty through the esophogus of the patient. Sedation performed by performing physician. The patient was monitored while under deep sedation. Anesthestetic sedation was provided intravenously by  Anesthesiology: 93.47mg  of Propofol. The patient developed no complications during the procedure. IMPRESSIONS  1. Left ventricular ejection fraction, by estimation, is <20%. The left ventricle has severely decreased function. The left ventricle demonstrates global hypokinesis. The left ventricular internal cavity size was mildly dilated.  2. Right ventricular systolic function is moderately reduced. The right ventricular size is moderately enlarged. There is moderately elevated pulmonary artery systolic pressure.  3. No ASD or PFO by color doppler. Left atrial size was moderately dilated. No left atrial/left atrial appendage thrombus was detected.  4. Right atrial size was moderately dilated.  5. Peak RV-RA gradient 36 mmHg. Tricuspid valve regurgitation is severe.  6. Moderately calcified  aortic valve with trivial regurgitation. There was no significant aortic stenosis, mean gradient 8 mmHg.  7. Moderate mitral regurgitation with restricted posterior mitral leaflet. . The mitral valve is abnormal. Moderate mitral valve regurgitation.  8. Normal caliber thoracic aorta with grade 3 plaque. FINDINGS  Left Ventricle: Left ventricular ejection fraction, by estimation, is <20%. The left ventricle has severely decreased function. The left ventricle demonstrates global hypokinesis. The left ventricular internal cavity size was mildly dilated. There is no  left ventricular hypertrophy. Right Ventricle: The right ventricular size is moderately enlarged. No increase in right ventricular wall thickness. Right ventricular systolic function is moderately reduced. There is moderately elevated pulmonary artery systolic pressure. The tricuspid  regurgitant velocity is 3.01 m/s, and with an assumed right atrial pressure of 10 mmHg, the estimated right ventricular systolic pressure is 91.4 mmHg. Left Atrium: No ASD or PFO by color doppler. Left atrial size was moderately dilated. No left atrial/left atrial appendage thrombus was detected. Right Atrium: Right atrial size was moderately dilated. Pericardium: There is no evidence of pericardial effusion. Mitral Valve: Moderate mitral regurgitation with restricted posterior mitral leaflet. The mitral valve is abnormal. Moderate mitral valve regurgitation. Tricuspid Valve: Peak RV-RA gradient 36 mmHg. The tricuspid valve is normal in structure. Tricuspid valve regurgitation is severe. Aortic Valve: Moderately calcified aortic valve with trivial regurgitation. There was no significant aortic stenosis, mean gradient 8 mmHg. The aortic valve is tricuspid. Aortic valve regurgitation is trivial. Aortic valve mean gradient measures 7.5 mmHg. Aortic valve peak gradient measures 14.3 mmHg. Pulmonic Valve: The pulmonic valve was normal in structure. Pulmonic valve regurgitation is  not visualized. Aorta: Normal caliber thoracic aorta with grade 3 plaque. The aortic root is normal in size and structure. IAS/Shunts: No atrial level shunt detected by color flow Doppler. Additional Comments: A pacer wire is visualized in the right ventricle.  AORTIC VALVE AV Vmax:      189.00 cm/s AV Vmean:     126.000 cm/s AV VTI:       0.314 m AV Peak Grad: 14.3 mmHg AV Mean Grad: 7.5 mmHg TRICUSPID VALVE TR Peak grad:   36.2 mmHg TR Vmax:        301.00 cm/s Loralie Champagne MD Electronically signed by Loralie Champagne MD Signature Date/Time: 09/05/2019/3:42:08 PM    Final    [place] EKG SITE RITE  Result Date: 08/25/2019 If Site Rite image not attached, placement could not be confirmed due to current cardiac rhythm.  [place] EKG SITE RITE  Result Date: 08/17/2019 If Site Rite image not attached, placement could  not be confirmed due to current cardiac rhythm.   ASSESSMENT AND PLAN: This is a pleasant 86 year old male with anemia secondary to iron deficiency and ITP.  He was previously on anticoagulation with Eliquis which has now been stopped secondary to thrombocytopenia.  Additionally, he received heparin during this hospitalization which has now been stopped.  He is currently on prednisone 1 mg/kg and tolerating this well.  Recommend for him to continue on prednisone the current dose.  We will also give 1 unit of platelets today since his platelet count less than 20,000.  Recommend platelet transfusion when platelet count is below 20,000 or active bleeding.  HIT panel pending. Pathology review of blood smear pending.  We will continue to follow.   LOS: 21 days   Mikey Bussing, DNP, AGPCNP-BC, AOCNP 09/06/19

## 2019-09-06 NOTE — Progress Notes (Signed)
Daily Progress Note   Patient Name: Ricardo Payne       Date: 09/06/2019 DOB: 07-Apr-1931  Age: 86 y.o. MRN#: [id number] Attending Physician: Geralynn Rile, MD Primary Care Physician: [name], FNP Admit Date: 08/16/2019  Reason for Consultation/Follow-up: Establishing goals of care  Subjective: Patient awake, alert, oriented. States "I feel good." Denies pain, dyspnea, or discomfort. He has ambulated in the hall.   GOC:  Discussed care with Dr. Carolin Sicks and renal navigator, [place].  Discussed care plan including diagnoses, interventions with patient. He is glad the last few days have gone well with dialysis and remains hopeful to continue hemodialysis in hopes of prolonging life. We did discuss this will be a bumpy road and may not tolerate for long. He is realistic with poor long-term prognosis and shares that he recently had a high school friend pass away suddenly at home. He shares that Dr. Aundra Dubin and Dr. Carolin Sicks may keep him admitted for another week to continue to pull off as much fluid as possible. He is ok with this. He has used prn dilaudid x2 nights and jokes "best sleep in 10 years." He is appreciative of visit.   Called son, Dwayne at bedside to provide update on diagnoses, interventions, plan of care. Answered questions. Son, Merrilee Seashore at bedside from [place]. Also answered questions. Reassured of ongoing support from palliative.    Length of Stay: 21  Current Medications: Scheduled Meds:  . allopurinol  50 mg Oral QODAY  . amiodarone  200 mg Oral Daily  . Chlorhexidine Gluconate Cloth  6 each Topical Q0600  . Chlorhexidine Gluconate Cloth  6 each Topical Q0600  . docusate sodium  200 mg Oral Daily  . folic acid-pyridoxine-cyancobalamin  1 tablet Oral Daily    . melatonin  6 mg Oral QHS  . midodrine  15 mg Oral TID with meals  . multivitamin  1 tablet Oral QHS  . polyethylene glycol  17 g Oral Daily  . pramipexole  0.25 mg Oral BID  . predniSONE  100 mg Oral Q breakfast  . rosuvastatin  10 mg Oral q1800  . sevelamer carbonate  2,400 mg Oral TID WC  . sodium chloride flush  10-40 mL Intracatheter Q12H  . sodium chloride flush  3 mL Intravenous Q12H  . tamsulosin  0.4  mg Oral Daily    Continuous Infusions: . sodium chloride Stopped (08/19/19 1323)  . ferric gluconate (FERRLECIT/NULECIT) IV      PRN Meds: sodium chloride, acetaminophen, HYDROmorphone (DILAUDID) injection, sodium chloride flush, sodium chloride flush  Physical Exam Vitals and nursing note reviewed.  Constitutional:      General: He is awake.     Appearance: He is ill-appearing.  Cardiovascular:     Rate and Rhythm: Regular rhythm.  Pulmonary:     Effort: No tachypnea, accessory muscle usage or respiratory distress.  Neurological:     Mental Status: He is alert and oriented to person, place, and time.            Vital Signs: BP (!) 118/56 (BP Location: Left Arm)   Pulse 72   Temp 98 F (36.7 C) (Oral)   Resp 18   Ht 5\' 6"  (1.676 m)   Wt 96.3 kg Comment: scale a  SpO2 100%   BMI 34.28 kg/m  SpO2: SpO2: 100 % O2 Device: O2 Device: Nasal Cannula O2 Flow Rate: O2 Flow Rate (L/min): 2 L/min  Intake/output summary:   Intake/Output Summary (Last 24 hours) at 09/06/2019 1000 Last data filed at 09/06/2019 0900 Gross per 24 hour  Intake 600 ml  Output 3250 ml  Net -2650 ml   LBM: Last BM Date: 09/04/19 Baseline Weight: Weight: 100.2 kg Most recent weight: Weight: 96.3 kg(scale a)       Palliative Assessment/Data: PPS 50%    Flowsheet Rows     Most Recent Value  Intake Tab  Referral Department  Cardiology  Unit at Time of Referral  Cardiac/Telemetry Unit  Palliative Care Primary Diagnosis  Cardiac  Date Notified  08/20/19  Palliative Care Type  Return  patient Palliative Care  Reason for referral  Clarify Goals of Care  Date of Admission  08/16/19  Date first seen by Palliative Care  08/20/19  # of days Palliative referral response time  0 Day(s)  # of days IP prior to Palliative referral  4  Clinical Assessment  Palliative Performance Scale Score  50%  Psychosocial & Spiritual Assessment  Palliative Care Outcomes  Patient/Family meeting held?  Yes  Who was at the meeting?  patient and son  Palliative Care Outcomes  Clarified goals of care, Provided end of life care assistance, Provided psychosocial or spiritual support, Changed CPR status, Completed durable DNR, ACP counseling assistance      Patient Active Problem List   Diagnosis Date Noted  . Dyspnea   . Acute decompensated heart failure ([place]) 08/16/2019  . Heart failure with reduced ejection fraction ([place]) 07/19/2019  . Congestive heart failure ([place]) 07/18/2019  . Palliative care by specialist   . Goals of care, counseling/discussion   . Cirrhosis ([place])   . CKD (chronic kidney disease) 06/25/2019  . Head trauma 06/25/2019  . Blood thinned due to long-term anticoagulant use 06/25/2019  . Anemia 06/25/2019  . Acute renal failure superimposed on chronic kidney disease ([place]) 05/23/2019  . Chronic combined systolic and diastolic CHF (congestive heart failure) ([place]) 05/23/2019  . Esophageal hypertension 05/23/2019  . Thrombocytopenia ([place]) 05/23/2019  . Acute on chronic HFrEF (heart failure with reduced ejection fraction) ([place]) 05/22/2019  . ICD (implantable cardioverter-defibrillator) in place 04/18/2019    Palliative Care Assessment & Plan   Patient Profile: 86 y.o. male  with past medical history of chronic combined CHF, ischemic cardiomyopathy EF 20%, CAD s/p CABG, ICD, paroxysmal atrial fibrillation on Eliquis, HTN, HLD, CKD stage  IV, multiple admissions for CHF exacerbations admitted on 08/16/2019 with worsening SOB and lower extremity edema. Heart failure team  following. Patient with poor progression despite aggressive medical management with high dose lasix gtt, metolazone, milrinone, and amio gtt. Worsening creatinine. Nephrology consulted. Palliative medicine consultation for goals of care.   Assessment: Acute on chronic systolic CHF Ischemic cardiomyopathy EF <20% Atrial fibrillation with RVR CAD s/p CABG AKI on CKD stage 4, likely cardiorenal Hx CVA  Recommendations/Plan:  MOST form completed this admission. Patient's decisions include: DNR/DNI, otherwise FULL scope treatment, ABX if indicated, trial of IVF if indicated, NO feeding tube. Durable DNR completed. Copies placed in chart and given to son.   Continue aggressive medical management per HF team and nephrology.   Continue PT efforts.  Appreciate renal navigator support. Can we find an outpatient clinic that will dialyze 4x week?   Recommend outpatient palliative referral.   PMT will continue to follow peripherally inpatient.   Code Status: DNR/DNI   Code Status Orders  (From admission, onward)         Start     Ordered   08/16/19 2215  Full code  Continuous     08/16/19 2216        Code Status History    Date Active Date Inactive Code Status Order ID Comments User Context   07/18/2019 1744 07/20/2019 1654 Full Code [id number]  Asencion Noble, MD ED   07/06/2019 0830 07/12/2019 1801 Full Code [id number]  Ina Homes, MD ED   05/22/2019 1141 05/23/2019 1722 Full Code [id number]  Leanor Kail, [place]   Advance Care Planning Activity       Prognosis:  Tenuous. Poor prognosis with end-stage heart failure EF <20%, cardiorenal syndrome.   Discharge Planning:  To Be Determined  Care plan was discussed with patient, son Karma Greaser), son Merrilee Seashore), Dr. Carolin Sicks, renal navigator  Thank you for allowing the Palliative Medicine Team to assist in the care of this patient.   Time In: 1000 Time Out: 1030 Total Time 30 Prolonged Time Billed no    Greater than 50%  of this time was spent counseling and coordinating care related to the above assessment and plan.   Ihor Dow, DNP, FNP-C Palliative Medicine Team  Phone: [phone] Fax: [phone]  Please contact Palliative Medicine Team phone at [phone] for questions and concerns.

## 2019-09-06 NOTE — Progress Notes (Signed)
Patient ID: Ricardo Payne, male   DOB: 06-01-1930, 86 y.o.   MRN: [id number]     Advanced Heart Failure Rounding Note  PCP-Cardiologist: Donato Heinz, MD   Subjective:    HD yesterday with no reported problems.  Weight down 10 lbs.  CVP still 26.   Platelets 31>21>17>18, patient seen by hematology and prednisone started. He is off apixaban, no further oozing at line site.   Walking in hall, doing quite well symptomatically despite high CVP.   Objective:   Weight Range: 96.3 kg Body mass index is 34.28 kg/m.   Vital Signs:   Temp:  [97.5 F (36.4 C)-98.6 F (37 C)] 98 F (36.7 C) (04/08 0833) Pulse Rate:  [70-80] 72 (04/08 0833) Resp:  [15-24] 18 (04/08 0833) BP: (98-118)/(47-62) 118/56 (04/08 0833) SpO2:  [95 %-100 %] 100 % (04/08 0833) Weight:  [96.3 kg-100.8 kg] 96.3 kg (04/08 0056) Last BM Date: 09/04/19  Weight change: Filed Weights   09/05/19 1425 09/05/19 1730 09/06/19 0056  Weight: 100.8 kg 96.7 kg 96.3 kg    Intake/Output:   Intake/Output Summary (Last 24 hours) at 09/06/2019 0929 Last data filed at 09/06/2019 0900 Gross per 24 hour  Intake 600 ml  Output 3250 ml  Net -2650 ml      Physical Exam  CVP 26 General: NAD Neck: JVP 16 cm, no thyromegaly or thyroid nodule.  Lungs: Clear to auscultation bilaterally with normal respiratory effort. CV: Lateral PMI.  Heart regular S1/S2, no S3/S4, 2/6 HSM LLSB/apex.  Trace ankle edema.   Abdomen: Soft, nontender, no hepatosplenomegaly, no distention.  Skin: Intact without lesions or rashes.  Neurologic: Alert and oriented x 3.  Psych: Normal affect. Extremities: No clubbing or cyanosis.  HEENT: Normal.    Telemetry   NSR 70-80s (personally reviewed)   Labs    CBC Recent Labs    09/05/19 0356 09/06/19 0420  WBC 5.8 6.3  HGB 9.1* 9.6*  HCT 29.8* 30.9*  MCV 99.3 99.0  PLT 17* 18*   Basic Metabolic Panel Recent Labs    09/05/19 0356 09/06/19 0420  NA 136 135  K 4.4 4.5  CL 99  99  CO2 28 26  GLUCOSE 145* 160*  BUN 22 25*  CREATININE 2.71* 2.79*  CALCIUM 8.5* 8.6*  PHOS 3.7 3.6   Liver Function Tests Recent Labs    09/05/19 0356 09/06/19 0420  ALBUMIN 3.1* 3.2*   No results for input(s): LIPASE, AMYLASE in the last 72 hours. Cardiac Enzymes No results for input(s): CKTOTAL, CKMB, CKMBINDEX, TROPONINI in the last 72 hours.  BNP: BNP (last 3 results) Recent Labs    08/13/19 1437 08/16/19 1659 08/17/19 0714  BNP 1,224.9* 1,081.1* 1,056.0*    ProBNP (last 3 results) Recent Labs    05/21/19 1535  PROBNP 6,191*     D-Dimer No results for input(s): DDIMER in the last 72 hours. Hemoglobin A1C No results for input(s): HGBA1C in the last 72 hours. Fasting Lipid Panel No results for input(s): CHOL, HDL, LDLCALC, TRIG, CHOLHDL, LDLDIRECT in the last 72 hours. Thyroid Function Tests No results for input(s): TSH, T4TOTAL, T3FREE, THYROIDAB in the last 72 hours.  Invalid input(s): FREET3  Other results:   Imaging    No results found.   Medications:     Scheduled Medications: . allopurinol  50 mg Oral QODAY  . amiodarone  200 mg Oral Daily  . Chlorhexidine Gluconate Cloth  6 each Topical Q0600  . Chlorhexidine Gluconate Cloth  6  each Topical Q0600  . docusate sodium  200 mg Oral Daily  . folic acid-pyridoxine-cyancobalamin  1 tablet Oral Daily  . melatonin  6 mg Oral QHS  . midodrine  15 mg Oral TID with meals  . multivitamin  1 tablet Oral QHS  . polyethylene glycol  17 g Oral Daily  . pramipexole  0.25 mg Oral BID  . predniSONE  100 mg Oral Q breakfast  . rosuvastatin  10 mg Oral q1800  . sevelamer carbonate  2,400 mg Oral TID WC  . sodium chloride flush  10-40 mL Intracatheter Q12H  . sodium chloride flush  3 mL Intravenous Q12H  . tamsulosin  0.4 mg Oral Daily    Infusions: . sodium chloride Stopped (08/19/19 1323)  . ferric gluconate (FERRLECIT/NULECIT) IV      PRN Medications: sodium chloride, acetaminophen,  HYDROmorphone (DILAUDID) injection, sodium chloride flush, sodium chloride flush   Assessment/Plan   1. Acute on chronic systolic CHF: Ischemic cardiomyopathy.  St Jude ICD.  Echo in 2/21 with EF <20%, mildly dilated RV with moderately decreased systolic function, mild AS, mild-moderate MR, severe TR.  CHF is complicated by cardiorenal syndrome and recurrent atrial fibrillation with RVR.  Suspect low output HF.  He was on milrinone but now off.  Maintaining NSR. He has started HD with goal to try to push down CVP to 15 range if possible, tolerating without problem.  CVP still very high at 26 but symptomatically, he is actually doing quite well.  Suspect that CVP is going to remain at least moderately high with severe TR. Continue midodrine to maintain BP. Co-ox 55%.  - Continue to dialyze as aggressively as possible to get fluid off, plan for HD again on 4/9.   2. CAD: S/p remote CABG (1989).  He does not think he has had a coronary evaluation since that time.  No chest pain.  HS-TnI not elevated. No chest pain.   - Continue Crestor.  - No ASA given anticoagulation.  3. Atrial fibrillation: Paroxysmal.  He was admitted with atrial fibrillation with RVR.  This may have triggered his CHF exacerbation (though based on device interrogation, worsening volume status does not seem to necessarily track with atrial fibrillation) or may be a consequence of the exacerbation.  TEE-guided DCCV done 3/24, remains in NSR.  - Continue amio 200 mg daily.   - Hold Eliquis with very low plts (see below).   4. AKI on CKD stage 4-> ESRD. BP remaining stable at HD with midodrine use.   - see above. Plan for HD again tomorrow.  Need to keep pushing fluid removal.   5. PAD: Patient reports peripheral intervention though I do not see this is in history.  6. CVA: In past. Likely related to atrial fibrillation.  - continue on Eliquis for secondary prevention when plts improved.   7. Thrombocytopenia: Plts on admit 75 K.  History of suspected ITP, follows with hematology as outpatient.  PLTs trending down 35>24>>32 > 30k >31>21>17>18 K. Hematology has seen in consult, prednisone started. No overt bleeding, stable hgb.  - Continue prednisone per hematology.  - Transfuse plts < 10. - Stay off heparin products, HIT sent.   - Restart Eliquis when able (need to see plts trending up). Will use SCDs for DVT prophylaxis.    8. Deconditioning: Doing well on hall walks.  - PT recommending HHPT  Length of Stay: 43  Loralie Champagne, MD  09/06/2019, 9:29 AM

## 2019-09-06 NOTE — Plan of Care (Signed)
  Problem: Education: Goal: Ability to demonstrate management of disease process will improve Outcome: Progressing Goal: Ability to verbalize understanding of medication therapies will improve Outcome: Progressing   Problem: Clinical Measurements: Goal: Respiratory complications will improve Outcome: Progressing

## 2019-09-06 NOTE — Progress Notes (Signed)
CARDIAC REHAB PHASE I   Offered to walk with pt. Pt declining at this, company at bedside. Will continue to follow and encourage ambulation as time allows.  Rufina Falco, RN BSN 09/06/2019 10:30 AM

## 2019-09-06 NOTE — Progress Notes (Signed)
Assessed patient's PICC line this morning and the patient c/o pain around the upper arm close to the arm pit. Dressing was removed and the skin was broken, reddened, and bruised. Upon palpation around the site, the patient complained of pain. The area is warm to the touch compatible with patient's body temp. The site was cleaned and allowed time to air dry and dressing replaced. At this time the patient stated that the site feels so much better and while pressing the site the patient still complained of pain but states its not as bad as it was before. RN and MD to be notified.

## 2019-09-06 NOTE — Progress Notes (Signed)
[facility] PROGRESS NOTE  Assessment/ Plan: Pt is a 86 y.o. yo male with ICM, EF less than 20%, dilated RV, CAD, AKI on CKD now dialysis dependent.  #Acute kidney injury on CKD stage IV due to cardiorenal syndrome: Started dialysis on 3/26.  Seen by vascular who placed tunneled dialysis catheter, no plan for permanent access because of multiple comorbidities and technical difficulties.  Cardiology note noted.  Patient received serial HD on 4/5-7 with ultrafiltration.  Tolerated well.  He looks clinically much better.  Plan for next HD on 4/9. SW consulted to arrange for OP HD unit.  #Acute on chronic CHF: Did not respond with inotropic support and diuretics.  EF is very low.  CVP 31 per cardiology.  Now volume status managed with dialysis as discussed above.  Cardiology following.  # Anemia of CKD: Saturation 12%, ordered IV iron.  Monitor Hb.  # Secondary hyperparathyroidism: Phosphorus at goal.  Continue Renvela.  # Hypotension/volume; monitor blood pressure.  UF during HD.  On midodrine during HD.  #Thrombocytopenia likely ITP per oncology.  On prednisone now.  Subjective: Seen and examined.  Feels much better.  Denies nausea vomiting chest pain shortness of breath.  Objective Vital signs in last 24 hours: Vitals:   09/05/19 1948 09/06/19 0056 09/06/19 0353 09/06/19 0833  BP: (!) 101/47  105/62 (!) 118/56  Pulse: 72   72  Resp: 20   18  Temp: (!) 97.5 F (36.4 C)  98.6 F (37 C) 98 F (36.7 C)  TempSrc: Oral   Oral  SpO2: 95%   100%  Weight:  96.3 kg    Height:       Weight change: 0 kg  Intake/Output Summary (Last 24 hours) at 09/06/2019 0846 Last data filed at 09/06/2019 0700 Gross per 24 hour  Intake 956 ml  Output 3200 ml  Net -2244 ml       Labs: Basic Metabolic Panel: Recent Labs  Lab 09/04/19 0445 09/05/19 0356 09/06/19 0420  NA 138 136 135  K 4.4 4.4 4.5  CL 100 99 99  CO2 27 28 26   GLUCOSE 146* 145* 160*  BUN 22 22 25*   CREATININE 3.01* 2.71* 2.79*  CALCIUM 8.6* 8.5* 8.6*  PHOS 3.4 3.7 3.6   Liver Function Tests: Recent Labs  Lab 09/04/19 0445 09/05/19 0356 09/06/19 0420  ALBUMIN 3.3* 3.1* 3.2*   No results for input(s): LIPASE, AMYLASE in the last 168 hours. No results for input(s): AMMONIA in the last 168 hours. CBC: Recent Labs  Lab 09/02/19 0452 09/02/19 0452 09/03/19 0433 09/03/19 0433 09/04/19 0445 09/05/19 0356 09/06/19 0420  WBC 5.9   < > 6.6   < > 6.7 5.8 6.3  HGB 9.7*   < > 9.7*   < > 9.7* 9.1* 9.6*  HCT 31.6*   < > 31.6*   < > 31.6* 29.8* 30.9*  MCV 97.2  --  98.8  --  98.4 99.3 99.0  PLT PLATELET CLUMPS NOTED ON SMEAR, UNABLE TO ESTIMATE   < > 31*   < > 21* 17* 18*   < > = values in this interval not displayed.   Cardiac Enzymes: No results for input(s): CKTOTAL, CKMB, CKMBINDEX, TROPONINI in the last 168 hours. CBG: No results for input(s): GLUCAP in the last 168 hours.  Iron Studies:  Recent Labs    09/05/19 0356  IRON 42*  TIBC 357  FERRITIN 73   Studies/Results: No results found.  Medications: Infusions: .  sodium chloride Stopped (08/19/19 1323)  . ferric gluconate (FERRLECIT/NULECIT) IV      Scheduled Medications: . allopurinol  50 mg Oral QODAY  . amiodarone  200 mg Oral Daily  . Chlorhexidine Gluconate Cloth  6 each Topical Q0600  . docusate sodium  200 mg Oral Daily  . folic acid-pyridoxine-cyancobalamin  1 tablet Oral Daily  . melatonin  6 mg Oral QHS  . midodrine  15 mg Oral TID with meals  . multivitamin  1 tablet Oral QHS  . polyethylene glycol  17 g Oral Daily  . pramipexole  0.25 mg Oral BID  . predniSONE  100 mg Oral Q breakfast  . rosuvastatin  10 mg Oral q1800  . sevelamer carbonate  2,400 mg Oral TID WC  . sodium chloride flush  10-40 mL Intracatheter Q12H  . sodium chloride flush  3 mL Intravenous Q12H  . tamsulosin  0.4 mg Oral Daily    have reviewed scheduled and prn medications.  Physical Exam: General:NAD, male sitting on  bed comfortable, Heart:RRR, s1s2 nl, no rubs Lungs:clear b/l, no crackle Abdomen:soft, Non-tender, non-distended Extremities: No LE edema Dialysis Access: Right IJ TDC.  Iliyana Convey Prasad Cecile Gillispie 09/06/2019,8:46 AM  LOS: 21 days  Pager: [phone]

## 2019-09-06 NOTE — Plan of Care (Signed)
  Problem: Education: Goal: Ability to demonstrate management of disease process will improve Outcome: Progressing Goal: Ability to verbalize understanding of medication therapies will improve Outcome: Progressing   

## 2019-09-06 NOTE — Progress Notes (Signed)
Physical Therapy Treatment Patient Details Name: Ricardo Payne MRN: [id number] DOB: [birth year] Today's Date: 09/06/2019    History of Present Illness Patient is a 86 y/o male who presents with LE swelling and SOB. CXR-pulmonary edema. Admitted with Acute Decompensated Systolic HF. PMH includes CKD, ischemic heart disease, HTN, PAF, HLD, ICD, CAD s/p CABG, two recent admission for ADHF and volume overload (06/2019, 07/2019).    PT Comments    Pt making good progress.  He was able to ambulate increased distance with 2 standing rest breaks.  Required cues for pace management and transfer techniques that promote energy conservation and pursed lip breathing.  Cont to advance as able.    Follow Up Recommendations  Home health PT     Equipment Recommendations  None recommended by PT(has DME)    Recommendations for Other Services       Precautions / Restrictions Precautions Precautions: Fall Precaution Comments: watch HR, A-fib    Mobility  Bed Mobility   Bed Mobility: Supine to Sit     Supine to sit: Modified independent (Device/Increase time);HOB elevated Sit to supine: Modified independent (Device/Increase time);HOB elevated   General bed mobility comments: use of rail and increased time  Transfers Overall transfer level: Modified independent   Transfers: Sit to/from Stand Sit to Stand: Modified independent (Device/Increase time)         General transfer comment: min cues for safety with lines/leads  Ambulation/Gait Ambulation/Gait assistance: Supervision Gait Distance (Feet): 300 Feet Assistive device: None Gait Pattern/deviations: Step-through pattern;Wide base of support;Decreased stride length     General Gait Details: Required 2 standing rest breaks and cues for pursed lip breathing. Ambulated on RA with sats 93% or >.  HR stable  Cues for controlled gait speed in order to manage exertion level .   Stairs             Wheelchair Mobility     Modified Rankin (Stroke Patients Only)       Balance Overall balance assessment: Needs assistance Sitting-balance support: Feet supported;No upper extremity supported Sitting balance-Leahy Scale: Normal     Standing balance support: During functional activity;No upper extremity supported Standing balance-Leahy Scale: Good Standing balance comment: Performed head turns and stepping over object with min guard during gait; toielting ADLs in standing independently                            Cognition Arousal/Alertness: Awake/alert Behavior During Therapy: WFL for tasks assessed/performed Overall Cognitive Status: Within Functional Limits for tasks assessed                                        Exercises      General Comments        Pertinent Vitals/Pain Pain Assessment: No/denies pain    Home Living                      Prior Function            PT Goals (current goals can now be found in the care plan section) Progress towards PT goals: Progressing toward goals    Frequency    Min 3X/week      PT Plan Current plan remains appropriate    Co-evaluation              AM-PAC PT "6 Clicks"  Mobility   Outcome Measure  Help needed turning from your back to your side while in a flat bed without using bedrails?: None Help needed moving from lying on your back to sitting on the side of a flat bed without using bedrails?: None Help needed moving to and from a bed to a chair (including a wheelchair)?: None Help needed standing up from a chair using your arms (e.g., wheelchair or bedside chair)?: None Help needed to walk in hospital room?: None Help needed climbing 3-5 steps with a railing? : A Little 6 Click Score: 23    End of Session Equipment Utilized During Treatment: Gait belt Activity Tolerance: Patient tolerated treatment well Patient left: in bed;with bed alarm set;with call bell/phone within reach Nurse  Communication: Mobility status PT Visit Diagnosis: Difficulty in walking, not elsewhere classified (R26.2)     Time: 1445-1510 PT Time Calculation (min) (ACUTE ONLY): 25 min  Charges:  $Gait Training: 8-22 mins $Therapeutic Activity: 8-22 mins                     Maggie Font, PT Acute Rehab Services Pager [phone] [facility] [phone] [facility] [place] 09/06/2019, 3:12 PM

## 2019-09-07 LAB — PREPARE PLATELET PHERESIS
Unit division: 0
Unit division: 0

## 2019-09-07 LAB — BPAM PLATELET PHERESIS
Blood Product Expiration Date: 202104092359
Blood Product Expiration Date: 202104102359
ISSUE DATE / TIME: 202104081716
ISSUE DATE / TIME: 202104081722
Unit Type and Rh: 6200
Unit Type and Rh: 6200

## 2019-09-07 LAB — RENAL FUNCTION PANEL
Albumin: 3.3 g/dL — ABNORMAL LOW (ref 3.5–5.0)
Anion gap: 13 (ref 5–15)
BUN: 42 mg/dL — ABNORMAL HIGH (ref 8–23)
CO2: 24 mmol/L (ref 22–32)
Calcium: 9 mg/dL (ref 8.9–10.3)
Chloride: 97 mmol/L — ABNORMAL LOW (ref 98–111)
Creatinine, Ser: 3.68 mg/dL — ABNORMAL HIGH (ref 0.61–1.24)
GFR calc Af Amer: 16 mL/min — ABNORMAL LOW (ref >60)
GFR calc non Af Amer: 14 mL/min — ABNORMAL LOW (ref >60)
Glucose, Bld: 154 mg/dL — ABNORMAL HIGH (ref 70–99)
Phosphorus: 3.9 mg/dL (ref 2.5–4.6)
Potassium: 4.6 mmol/L (ref 3.5–5.1)
Sodium: 134 mmol/L — ABNORMAL LOW (ref 135–145)

## 2019-09-07 LAB — CBC
HCT: 30.9 % — ABNORMAL LOW (ref 39.0–52.0)
Hemoglobin: 9.4 g/dL — ABNORMAL LOW (ref 13.0–17.0)
MCH: 30.3 pg (ref 26.0–34.0)
MCHC: 30.4 g/dL (ref 30.0–36.0)
MCV: 99.7 fL (ref 80.0–100.0)
Platelets: 40 10*3/uL — ABNORMAL LOW (ref 150–400)
RBC: 3.1 MIL/uL — ABNORMAL LOW (ref 4.22–5.81)
RDW: 16.7 % — ABNORMAL HIGH (ref 11.5–15.5)
WBC: 8.7 10*3/uL (ref 4.0–10.5)
nRBC: 0 % (ref 0.0–0.2)

## 2019-09-07 LAB — COOXEMETRY PANEL
Carboxyhemoglobin: 1.4 % (ref 0.5–1.5)
Methemoglobin: 1.1 % (ref 0.0–1.5)
O2 Saturation: 54.1 %
Total hemoglobin: 10.1 g/dL — ABNORMAL LOW (ref 12.0–16.0)

## 2019-09-07 LAB — PATHOLOGIST SMEAR REVIEW

## 2019-09-07 MED ORDER — PENTAFLUOROPROP-TETRAFLUOROETH EX AERO: 1.0000 "application " | INHALATION_SPRAY | CUTANEOUS | Status: DC | PRN

## 2019-09-07 MED ORDER — HEPARIN SODIUM (PORCINE) 1000 UNIT/ML IJ SOLN
INTRAMUSCULAR | Status: AC
  Filled 2019-09-07: qty 4

## 2019-09-07 MED ORDER — SEVELAMER CARBONATE 800 MG PO TABS
2400.0000 mg | ORAL_TABLET | Freq: Three times a day (TID) | ORAL | Status: DC
  Administered 2019-09-07 – 2019-09-11 (×8): 2400 mg via ORAL
  Filled 2019-09-07 (×8): qty 3

## 2019-09-07 MED ORDER — MIDODRINE HCL 5 MG PO TABS
15.0000 mg | ORAL_TABLET | Freq: Three times a day (TID) | ORAL | Status: DC
  Administered 2019-09-07 – 2019-09-10 (×8): 15 mg via ORAL
  Filled 2019-09-07 (×8): qty 3

## 2019-09-07 MED ORDER — HEPARIN SODIUM (PORCINE) 1000 UNIT/ML DIALYSIS: 1000.0000 [IU] | INTRAMUSCULAR | Status: DC | PRN

## 2019-09-07 MED ORDER — LIDOCAINE HCL (PF) 1 % IJ SOLN: 5.0000 mL | INTRAMUSCULAR | Status: DC | PRN

## 2019-09-07 MED ORDER — SODIUM CHLORIDE 0.9 % IV SOLN: 100.0000 mL | INTRAVENOUS | Status: DC | PRN

## 2019-09-07 MED ORDER — APIXABAN 2.5 MG PO TABS: 2.5000 mg | ORAL_TABLET | Freq: Two times a day (BID) | ORAL | Status: DC

## 2019-09-07 MED ORDER — ALTEPLASE 2 MG IJ SOLR: 2.0000 mg | Freq: Once | INTRAMUSCULAR | Status: DC | PRN

## 2019-09-07 MED ORDER — LIDOCAINE-PRILOCAINE 2.5-2.5 % EX CREA: 1.0000 "application " | TOPICAL_CREAM | CUTANEOUS | Status: DC | PRN

## 2019-09-07 NOTE — Progress Notes (Signed)
Renal Navigator has been asked to see if a 4x per week OP HD schedule can be secured for patient. Unfortunately at this time, patient's clinic is not able to provide a confirmed 4th treatment per week for him, however, I have discussed with clinic manager/A. Alfredo Martinez that they will attempt to coordinate a 4th treatment per week for patient when possible. This update was communicated to Dr. Mia Creek.  Renal Navigator will continue to follow and informed OP HD clinic that Wednesday, 4/14 would be the first potential for patient to start in the clinic.  Alphonzo Cruise,  Renal Navigator [phone]

## 2019-09-07 NOTE — Progress Notes (Signed)
Brief Hematology Note:  Status post 1 unit of platelets on 09/06/2019.  He tolerated this well.  He is not having any bleeding today.  Due for hemodialysis later today.  CBC results have been reviewed.  Platelet count has improved to 40,000 today.  CBC    Component Value Date/Time   WBC 8.7 09/07/2019 0349   RBC 3.10 (L) 09/07/2019 0349   HGB 9.4 (L) 09/07/2019 0349   HGB 11.5 (L) 08/13/2019 1437   HCT 30.9 (L) 09/07/2019 0349   HCT 35.5 (L) 08/13/2019 1437   PLT 40 (L) 09/07/2019 0349   PLT 95 (LL) 08/13/2019 1437   MCV 99.7 09/07/2019 0349   MCV 94 08/13/2019 1437   MCH 30.3 09/07/2019 0349   MCHC 30.4 09/07/2019 0349   RDW 16.7 (H) 09/07/2019 0349   RDW 16.2 (H) 08/13/2019 1437   LYMPHSABS 1.1 07/18/2019 1423   LYMPHSABS 0.9 05/18/2019 1454   MONOABS 0.7 07/18/2019 1423   EOSABS 0.0 07/18/2019 1423   EOSABS 0.1 05/18/2019 1454   BASOSABS 0.0 07/18/2019 1423   BASOSABS 0.0 05/18/2019 1454    Recommendations: 1.  Continue prednisone 1 mg/kg daily.  We can begin to reduce the dose when platelets come back to baseline and remained stable over several days. 2.  Consider resuming anticoagulation once platelet count is 50,000 or higher without infusion support. 3.  Please call hematology over the weekend for questions.  We will follow-up again on Monday, 09/10/2019.  Mikey Bussing, DNP, AGPCNP-BC, AOCNP Mon/Tues/Thurs/Fri 7am-5pm; Off Wednesdays Cell: [phone]

## 2019-09-07 NOTE — TOC Progression Note (Addendum)
Transition of Care [facility]) - Progression Note    Patient Details  Name: Ricardo Payne MRN: [id number] Date of Birth: 1930-06-19  [facility] CM/SW [place], [place] Phone Number: 09/07/2019, 1:59 PM  Clinical Narrative:     [facility] team continues to follow for discharge planning, outpatient palliative referral made to Bradd Canary with [facility] and patient is set up with [facility] home health for PT and RN, no DME needs identified unless patient will need oxygen. CSW spoke with patient's son Dwayne all questions and concerns answered at this time.   Expected Discharge Plan: [place] Barriers to Discharge: Continued Medical Work up  Expected Discharge Plan and Services Expected Discharge Plan: [place] In-house Referral: Clinical Social Work   Post Acute Care Choice: [place] arrangements for the past 2 months: [place]: PT, RN [place] Agency: [place] Date [place]: 08/20/19 Time [place]: [id number] Representative spoke with at [place]: [place] Determinants of Health ([place]) Interventions    Readmission Risk Interventions No flowsheet data found.

## 2019-09-07 NOTE — Progress Notes (Signed)
Unable to obtain 1600 CVP because patient was in dialysis. Upon returning to the unit CVP was 27.

## 2019-09-07 NOTE — Progress Notes (Signed)
CARDIAC REHAB PHASE I   Offered to walk with pt. Pt tangled in wires. Offered to get pt up to chair and untangle wires, pt preferred to stay in bed. Wires untangled to pts satisfaction. For HD soon. Will continue to follow as time allows and encourage ambulation.  [id number] Rufina Falco, RN BSN 09/07/2019 11:08 AM

## 2019-09-07 NOTE — Progress Notes (Addendum)
Patient ID: Ricardo Payne, male   DOB: 1930-12-14, 86 y.o.   MRN: [id number]     Advanced Heart Failure Rounding Note  PCP-Cardiologist: Donato Heinz, MD   Subjective:    Weight up 5 pounds. Denies SOB. CVP 31   Platelets 31>21>17>18>40  patient seen by hematology and prednisone started. Received platelet transfusion yesterday.     Objective:   Weight Range: 98.5 kg Body mass index is 35.06 kg/m.   Vital Signs:   Temp:  [97.7 F (36.5 C)-98.4 F (36.9 C)] 98.2 F (36.8 C) (04/09 0720) Pulse Rate:  [67-73] 70 (04/09 0720) Resp:  [13-21] 18 (04/09 0720) BP: (99-123)/(55-86) 110/63 (04/09 0720) SpO2:  [95 %-100 %] 100 % (04/09 0720) Weight:  [98.5 kg] 98.5 kg (04/09 0014) Last BM Date: 09/06/19  Weight change: Filed Weights   09/05/19 1730 09/06/19 0056 09/07/19 0014  Weight: 96.7 kg 96.3 kg 98.5 kg    Intake/Output:   Intake/Output Summary (Last 24 hours) at 09/07/2019 0817 Last data filed at 09/07/2019 0805 Gross per 24 hour  Intake 1426 ml  Output 150 ml  Net 1276 ml      Physical Exam  CVP 31 General:  . No resp difficulty HEENT: normal Neck: supple. JVP to jaw . Carotids 2+ bilat; no bruits. No lymphadenopathy or thryomegaly appreciated. Cor: PMI nondisplaced. Regular rate & rhythm. No rubs, gallops or murmurs. R upper chest tunneled cath.  Lungs: clear Abdomen: soft, nontender, nondistended. No hepatosplenomegaly. No bruits or masses. Good bowel sounds. Extremities: no cyanosis, clubbing, rash, edema Neuro: alert & orientedx3, cranial nerves grossly intact. moves all 4 extremities w/o difficulty. Affect pleasant    Telemetry   NSR 70-80s (personally reviewed)   Labs    CBC Recent Labs    09/06/19 0420 09/07/19 0349  WBC 6.3 8.7  HGB 9.6* 9.4*  HCT 30.9* 30.9*  MCV 99.0 99.7  PLT 18* 40*   Basic Metabolic Panel Recent Labs    09/06/19 0420 09/07/19 0349  NA 135 134*  K 4.5 4.6  CL 99 97*  CO2 26 24  GLUCOSE 160* 154*   BUN 25* 42*  CREATININE 2.79* 3.68*  CALCIUM 8.6* 9.0  PHOS 3.6 3.9   Liver Function Tests Recent Labs    09/06/19 0420 09/07/19 0349  ALBUMIN 3.2* 3.3*   No results for input(s): LIPASE, AMYLASE in the last 72 hours. Cardiac Enzymes No results for input(s): CKTOTAL, CKMB, CKMBINDEX, TROPONINI in the last 72 hours.  BNP: BNP (last 3 results) Recent Labs    08/13/19 1437 08/16/19 1659 08/17/19 0714  BNP 1,224.9* 1,081.1* 1,056.0*    ProBNP (last 3 results) Recent Labs    05/21/19 1535  PROBNP 6,191*     D-Dimer No results for input(s): DDIMER in the last 72 hours. Hemoglobin A1C No results for input(s): HGBA1C in the last 72 hours. Fasting Lipid Panel No results for input(s): CHOL, HDL, LDLCALC, TRIG, CHOLHDL, LDLDIRECT in the last 72 hours. Thyroid Function Tests No results for input(s): TSH, T4TOTAL, T3FREE, THYROIDAB in the last 72 hours.  Invalid input(s): FREET3  Other results:   Imaging    No results found.   Medications:     Scheduled Medications: . sodium chloride   Intravenous Once  . allopurinol  50 mg Oral QODAY  . amiodarone  200 mg Oral Daily  . Chlorhexidine Gluconate Cloth  6 each Topical Q0600  . Chlorhexidine Gluconate Cloth  6 each Topical Q0600  . docusate sodium  200 mg Oral Daily  . folic acid-pyridoxine-cyancobalamin  1 tablet Oral Daily  . melatonin  6 mg Oral QHS  . midodrine  15 mg Oral TID with meals  . multivitamin  1 tablet Oral QHS  . polyethylene glycol  17 g Oral Daily  . pramipexole  0.25 mg Oral BID  . predniSONE  100 mg Oral Q breakfast  . rosuvastatin  10 mg Oral q1800  . sevelamer carbonate  2,400 mg Oral TID WC  . sodium chloride flush  10-40 mL Intracatheter Q12H  . sodium chloride flush  3 mL Intravenous Q12H  . tamsulosin  0.4 mg Oral Daily    Infusions: . sodium chloride Stopped (08/19/19 1323)  . ferric gluconate (FERRLECIT/NULECIT) IV      PRN Medications: sodium chloride, acetaminophen,  HYDROmorphone (DILAUDID) injection, sodium chloride flush, sodium chloride flush   Assessment/Plan   1. Acute on chronic systolic CHF: Ischemic cardiomyopathy.  [employer] ICD.  Echo in 2/21 with EF <20%, mildly dilated RV with moderately decreased systolic function, mild AS, mild-moderate MR, severe TR.  CHF is complicated by cardiorenal syndrome and recurrent atrial fibrillation with RVR.  Suspect low output HF.  He was on milrinone but now off.  Maintaining NSR. He has started HD with goal to try to push down CVP to 15 range if possible, tolerating without problem.  CVP still very high at 26 but symptomatically, he is actually doing quite well.  Suspect that CVP is going to remain at least moderately high with severe TR. Continue midodrine to maintain BP.  - CVP 31 . Remains overloaded.  - Continue to dialyze as aggressively as possible to get fluid off, plan for HD again on 4/9.   2. CAD: S/p remote CABG (1989).  He does not think he has had a coronary evaluation since that time.  No chest pain.  HS-TnI not elevated. No chest pain.   - Continue Crestor.  - No ASA given anticoagulation.  3. Atrial fibrillation: Paroxysmal.  He was admitted with atrial fibrillation with RVR.  This may have triggered his CHF exacerbation (though based on device interrogation, worsening volume status does not seem to necessarily track with atrial fibrillation) or may be a consequence of the exacerbation.  TEE-guided DCCV done 3/24, remains in NSR.  - Continue amio 200 mg daily.   - Hold Eliquis with very low plts (see below).  Hold for now.  4. AKI on CKD stage 4-> ESRD. BP remaining stable at HD with midodrine use.   - see above. Plan for HD again today.    5. PAD: Patient reports peripheral intervention though I do not see this is in history.  6. CVA: In past. Likely related to atrial fibrillation.  - continue on Eliquis for secondary prevention when plts improved.   7. Thrombocytopenia: Plts on admit 75 K. History  of suspected ITP, follows with hematology as outpatient.  PLTs trending down 35>24>>32 > 30k >31>21>17>18>40  K. HIT negative.   Hematology has seen in consult, prednisone started.  Received  Platelet transfusion 4/8 No overt bleeding, stable hgb.  - Continue prednisone per hematology.  - Transfuse plts < 10. - Restart Eliquis once platelets stable, possibly tomorrow if he maintains. Will use SCDs for DVT prophylaxis.    8. Deconditioning: Doing well on hall walks.  - PT recommending HHPT. HH ordered.   Length of Stay: [place], NP  09/07/2019, 8:17 AM  Patient seen with NP, agree with the  above note .  CVP still around 30, no HD yesterday.  Feels good and walking halls.  HIT negative, got 1 unit plts yesterday so plts higher at 40K.   On exam, remains volume overloaded.  Rhythm remains NSR.   Tolerating HD but still need fluid off.  Discussed with Dr. Carolin Sicks, HD today.  Will aim for significant UF.  BP stable.  Will be able to go home when volume is a bit better and plts recovered.   He remains on prednisone 100 mg/day for ITP, hematology following.  If he maintains plts post-transfusion, will restart Eliquis (for atrial fibrillation).    Loralie Champagne 09/07/2019 9:11 AM

## 2019-09-07 NOTE — Progress Notes (Signed)
[facility] NEPHROLOGY PROGRESS NOTE  Assessment/ Plan: Pt is a 86 y.o. yo male with ICM, EF less than 20%, dilated RV, CAD, AKI on CKD now dialysis dependent.  #Acute kidney injury on CKD stage IV due to cardiorenal syndrome: Started dialysis on 3/26.  Seen by vascular who placed tunneled dialysis catheter, no plan for permanent access because of multiple comorbidities and technical difficulties.  Cardiology note noted.  Patient received serial HD on 4/5-7 with ultrafiltration, tolerated well.  He looks clinically much better.  Plan for next HD today and then MWF schedule.  SW consulted to arrange for OP HD unit.  #Acute on chronic CHF: Did not respond with inotropic support and diuretics.  EF is very low.  CVP 31 per cardiology.  Now volume status managed with dialysis as discussed above.  Cardiology following.  # Anemia of CKD: Saturation 12%, ordered IV iron.  Monitor Hb.  # Secondary hyperparathyroidism: Phosphorus at goal.  Continue Renvela.  # Hypotension/volume; monitor blood pressure.  UF during HD.  On midodrine during HD.  #Thrombocytopenia likely ITP per oncology.  On prednisone now.  Discussed with cardiologist.  Subjective: Seen and examined. Reports good energy level.  Denies nausea vomiting chest pain shortness of breath.  Objective Vital signs in last 24 hours: Vitals:   09/07/19 0014 09/07/19 0300 09/07/19 0316 09/07/19 0720  BP:  122/63  110/63  Pulse:  67  70  Resp:  13  18  Temp:   97.8 F (36.6 C) 98.2 F (36.8 C)  TempSrc:    Oral  SpO2:  95%  100%  Weight: 98.5 kg     Height:       Weight change: -2.279 kg  Intake/Output Summary (Last 24 hours) at 09/07/2019 0835 Last data filed at 09/07/2019 0805 Gross per 24 hour  Intake 1426 ml  Output 150 ml  Net 1276 ml       Labs: Basic Metabolic Panel: Recent Labs  Lab 09/05/19 0356 09/06/19 0420 09/07/19 0349  NA 136 135 134*  K 4.4 4.5 4.6  CL 99 99 97*  CO2 28 26 24   GLUCOSE  145* 160* 154*  BUN 22 25* 42*  CREATININE 2.71* 2.79* 3.68*  CALCIUM 8.5* 8.6* 9.0  PHOS 3.7 3.6 3.9   Liver Function Tests: Recent Labs  Lab 09/05/19 0356 09/06/19 0420 09/07/19 0349  ALBUMIN 3.1* 3.2* 3.3*   No results for input(s): LIPASE, AMYLASE in the last 168 hours. No results for input(s): AMMONIA in the last 168 hours. CBC: Recent Labs  Lab 09/03/19 0433 09/03/19 0433 09/04/19 0445 09/04/19 0445 09/05/19 0356 09/06/19 0420 09/07/19 0349  WBC 6.6   < > 6.7   < > 5.8 6.3 8.7  HGB 9.7*   < > 9.7*   < > 9.1* 9.6* 9.4*  HCT 31.6*   < > 31.6*   < > 29.8* 30.9* 30.9*  MCV 98.8  --  98.4  --  99.3 99.0 99.7  PLT 31*   < > 21*   < > 17* 18* 40*   < > = values in this interval not displayed.   Cardiac Enzymes: No results for input(s): CKTOTAL, CKMB, CKMBINDEX, TROPONINI in the last 168 hours. CBG: No results for input(s): GLUCAP in the last 168 hours.  Iron Studies:  Recent Labs    09/05/19 0356  IRON 42*  TIBC 357  FERRITIN 73   Studies/Results: No results found.  Medications: Infusions: . sodium chloride Stopped (08/19/19 1323)  .  ferric gluconate (FERRLECIT/NULECIT) IV      Scheduled Medications: . sodium chloride   Intravenous Once  . allopurinol  50 mg Oral QODAY  . amiodarone  200 mg Oral Daily  . Chlorhexidine Gluconate Cloth  6 each Topical Q0600  . Chlorhexidine Gluconate Cloth  6 each Topical Q0600  . docusate sodium  200 mg Oral Daily  . folic acid-pyridoxine-cyancobalamin  1 tablet Oral Daily  . melatonin  6 mg Oral QHS  . midodrine  15 mg Oral TID with meals  . multivitamin  1 tablet Oral QHS  . polyethylene glycol  17 g Oral Daily  . pramipexole  0.25 mg Oral BID  . predniSONE  100 mg Oral Q breakfast  . rosuvastatin  10 mg Oral q1800  . sevelamer carbonate  2,400 mg Oral TID WC  . sodium chloride flush  10-40 mL Intracatheter Q12H  . sodium chloride flush  3 mL Intravenous Q12H  . tamsulosin  0.4 mg Oral Daily    have reviewed  scheduled and prn medications.  Physical Exam: General:NAD, male sitting on chair comfortable, Heart:RRR, s1s2 nl, no rubs Lungs:clear b/l, no crackle Abdomen:soft, Non-tender, non-distended Extremities: No LE edema Dialysis Access: Right IJ TDC.  Brandy Zuba Prasad Froilan Mclean 09/07/2019,8:35 AM  LOS: 22 days  Pager: [phone]

## 2019-09-08 LAB — RENAL FUNCTION PANEL
Albumin: 3.3 g/dL — ABNORMAL LOW (ref 3.5–5.0)
Anion gap: 11 (ref 5–15)
BUN: 27 mg/dL — ABNORMAL HIGH (ref 8–23)
CO2: 28 mmol/L (ref 22–32)
Calcium: 8.7 mg/dL — ABNORMAL LOW (ref 8.9–10.3)
Chloride: 99 mmol/L (ref 98–111)
Creatinine, Ser: 2.97 mg/dL — ABNORMAL HIGH (ref 0.61–1.24)
GFR calc Af Amer: 21 mL/min — ABNORMAL LOW (ref >60)
GFR calc non Af Amer: 18 mL/min — ABNORMAL LOW (ref >60)
Glucose, Bld: 154 mg/dL — ABNORMAL HIGH (ref 70–99)
Phosphorus: 3.7 mg/dL (ref 2.5–4.6)
Potassium: 4.1 mmol/L (ref 3.5–5.1)
Sodium: 138 mmol/L (ref 135–145)

## 2019-09-08 LAB — CBC
HCT: 29.8 % — ABNORMAL LOW (ref 39.0–52.0)
Hemoglobin: 9.2 g/dL — ABNORMAL LOW (ref 13.0–17.0)
MCH: 30.7 pg (ref 26.0–34.0)
MCHC: 30.9 g/dL (ref 30.0–36.0)
MCV: 99.3 fL (ref 80.0–100.0)
Platelets: 28 10*3/uL — CL (ref 150–400)
RBC: 3 MIL/uL — ABNORMAL LOW (ref 4.22–5.81)
RDW: 16.6 % — ABNORMAL HIGH (ref 11.5–15.5)
WBC: 8 10*3/uL (ref 4.0–10.5)
nRBC: 0 % (ref 0.0–0.2)

## 2019-09-08 LAB — COOXEMETRY PANEL
Carboxyhemoglobin: 1.4 % (ref 0.5–1.5)
Methemoglobin: 1.1 % (ref 0.0–1.5)
O2 Saturation: 52.3 %
Total hemoglobin: 9.8 g/dL — ABNORMAL LOW (ref 12.0–16.0)

## 2019-09-08 MED ORDER — DARBEPOETIN ALFA 60 MCG/0.3ML IJ SOSY
60.0000 ug | PREFILLED_SYRINGE | INTRAMUSCULAR | Status: DC
  Filled 2019-09-08: qty 0.3

## 2019-09-08 NOTE — Progress Notes (Signed)
CARDIAC REHAB PHASE I   PRE:  Rate/Rhythm: 10 SR  BP:  Sitting: 107/52      SaO2: 99% 2 Liters  MODE:  Ambulation: 250 ff (2x assist)   POST:  Rate/Rhythm: 81 SR  BP:  Sitting: 106/69      SaO2: 98% RA  Pt in bed willing to walk. Untangled wires prior to walk. Pt ambulated 261ft with 2 rest breaks. Pt took two rest breaks due to SOB. Denied CP and dizziness. Checked oxygen during 2nd rest break, it was 98% RA. Gait was steady, but pt required several cues to slow down. Assisted pt back in bed. Arranged wires to pt's liking. Brought pt mouthwash swabs per pt's request. Left pt with lunch food tray. Call bell within reach.   [phone]  Carma Lair MS, ACSM CEP  12:10 PM 09/08/2019

## 2019-09-08 NOTE — Progress Notes (Signed)
Ship broker received referral for pt to participate in [facility] palliative program once pt discharges. Called pt's son, Karren Burly, to confirm interest. Liaison to follow while in hospital to alert [facility] palliative team who will then outreach family to arrange Palliative admission visit.     Please do not hesitate to call with any questions and thank you for the referral.     Freddie Breech, RN  [facility] Liaison   [phone]

## 2019-09-08 NOTE — Progress Notes (Signed)
[facility] NEPHROLOGY PROGRESS NOTE  Assessment/ Plan: Pt is a 86 y.o. yo male with ICM, EF less than 20%, dilated RV, CAD, AKI on CKD now dialysis dependent.  #Acute kidney injury on CKD stage IV due to cardiorenal syndrome: Started dialysis on 3/26.  Seen by vascular who placed tunneled dialysis catheter, no plan for permanent access because of multiple comorbidities and technical difficulties.  Cardiology note noted.  Patient received serial HD on 4/5-7 with ultrafiltration, tolerated well.  He looks clinically much better.  Status post HD yesterday with 3 L UF, tolerated well.  Clinically looks much better.  Plan for next HD on 4/12 as per MWF schedule. SW consulted to arrange for OP HD unit. First OP tx will be on 4/14.  #Acute on chronic CHF: Did not respond with inotropic support and diuretics.  EF is very low.  CVP 31 per cardiology.  Now volume status managed with dialysis as discussed above.  Cardiology following.  # Anemia of CKD: Saturation 12%.  Started IV iron and ESA.  Monitor Hb.  # Secondary hyperparathyroidism: Phosphorus at goal.  Continue Renvela.  # Hypotension/volume; monitor blood pressure.  UF during HD.  On midodrine during HD.  #Thrombocytopenia likely ITP per oncology.  On prednisone now.  Received platelet on 4/8.  Discussed with Dr. Aundra Dubin.  Subjective: Seen and examined.  Feels great.  Denies nausea vomiting chest pain shortness of breath.  No new event.  Objective Vital signs in last 24 hours: Vitals:   09/08/19 0013 09/08/19 0018 09/08/19 0246 09/08/19 0430  BP:  101/60    Pulse: 67 66  69  Resp: 14 (!) 23  19  Temp:  97.6 F (36.4 C)    TempSrc:  Oral    SpO2: 99% 100%  99%  Weight:   96.1 kg   Height:       Weight change: 0.079 kg  Intake/Output Summary (Last 24 hours) at 09/08/2019 0926 Last data filed at 09/08/2019 0300 Gross per 24 hour  Intake 830 ml  Output 3100 ml  Net -2270 ml       Labs: Basic Metabolic  Panel: Recent Labs  Lab 09/06/19 0420 09/07/19 0349 09/08/19 0518  NA 135 134* 138  K 4.5 4.6 4.1  CL 99 97* 99  CO2 26 24 28   GLUCOSE 160* 154* 154*  BUN 25* 42* 27*  CREATININE 2.79* 3.68* 2.97*  CALCIUM 8.6* 9.0 8.7*  PHOS 3.6 3.9 3.7   Liver Function Tests: Recent Labs  Lab 09/06/19 0420 09/07/19 0349 09/08/19 0518  ALBUMIN 3.2* 3.3* 3.3*   No results for input(s): LIPASE, AMYLASE in the last 168 hours. No results for input(s): AMMONIA in the last 168 hours. CBC: Recent Labs  Lab 09/04/19 0445 09/04/19 0445 09/05/19 0356 09/05/19 0356 09/06/19 0420 09/07/19 0349 09/08/19 0518  WBC 6.7   < > 5.8   < > 6.3 8.7 8.0  HGB 9.7*   < > 9.1*   < > 9.6* 9.4* 9.2*  HCT 31.6*   < > 29.8*   < > 30.9* 30.9* 29.8*  MCV 98.4  --  99.3  --  99.0 99.7 99.3  PLT 21*   < > 17*   < > 18* 40* 28*   < > = values in this interval not displayed.   Cardiac Enzymes: No results for input(s): CKTOTAL, CKMB, CKMBINDEX, TROPONINI in the last 168 hours. CBG: No results for input(s): GLUCAP in the last 168 hours.  Iron  Studies:  No results for input(s): IRON, TIBC, TRANSFERRIN, FERRITIN in the last 72 hours. Studies/Results: No results found.  Medications: Infusions: . sodium chloride Stopped (08/19/19 1323)  . ferric gluconate (FERRLECIT/NULECIT) IV Stopped (09/07/19 1849)    Scheduled Medications: . sodium chloride   Intravenous Once  . allopurinol  50 mg Oral QODAY  . amiodarone  200 mg Oral Daily  . Chlorhexidine Gluconate Cloth  6 each Topical Q0600  . Chlorhexidine Gluconate Cloth  6 each Topical Q0600  . docusate sodium  200 mg Oral Daily  . folic acid-pyridoxine-cyancobalamin  1 tablet Oral Daily  . melatonin  6 mg Oral QHS  . midodrine  15 mg Oral TID with meals  . multivitamin  1 tablet Oral QHS  . polyethylene glycol  17 g Oral Daily  . pramipexole  0.25 mg Oral BID  . predniSONE  100 mg Oral Q breakfast  . rosuvastatin  10 mg Oral q1800  . sevelamer carbonate   2,400 mg Oral TID WC  . sodium chloride flush  10-40 mL Intracatheter Q12H  . sodium chloride flush  3 mL Intravenous Q12H  . tamsulosin  0.4 mg Oral Daily    have reviewed scheduled and prn medications.  Physical Exam: General:NAD, sitting on bed having breakfast. Heart:RRR, s1s2 nl, no rubs Lungs:clear b/l, no crackle Abdomen:soft, Non-tender, non-distended Extremities: No LE edema Dialysis Access: Right IJ TDC.  Ricardo Payne Ricardo Payne Ricardo Payne 09/08/2019,9:26 AM  LOS: 23 days  Pager: [phone]

## 2019-09-08 NOTE — Progress Notes (Signed)
Patient ID: Ricardo Payne, male   DOB: 01/20/1931, 86 y.o.   MRN: [id number]     Advanced Heart Failure Rounding Note  PCP-Cardiologist: Donato Heinz, MD   Subjective:    Weight down 6 lbs post-HD.  CVP still 29.   Platelets 31>21>17>18> transfused 1 unit plts > 40 > 28.  Prednisone ongoing.  No overt bleeding.   Walking in hall without difficulty.    Objective:   Weight Range: 96.1 kg Body mass index is 34.19 kg/m.   Vital Signs:   Temp:  [97.6 F (36.4 C)-98.2 F (36.8 C)] 97.6 F (36.4 C) (04/10 0018) Pulse Rate:  [42-91] 69 (04/10 0430) Resp:  [9-81] 19 (04/10 0430) BP: (100-121)/(50-64) 101/60 (04/10 0018) SpO2:  [91 %-100 %] 99 % (04/10 0430) Weight:  [95.5 kg-98.6 kg] 96.1 kg (04/10 0246) Last BM Date: 09/03/19  Weight change: Filed Weights   09/07/19 1320 09/07/19 1734 09/08/19 0246  Weight: 98.6 kg 95.5 kg 96.1 kg    Intake/Output:   Intake/Output Summary (Last 24 hours) at 09/08/2019 1155 Last data filed at 09/08/2019 0300 Gross per 24 hour  Intake 700 ml  Output 3000 ml  Net -2300 ml      Physical Exam   CVP 29 General: NAD Neck: JVP 16 cm, no thyromegaly or thyroid nodule.  Lungs: Clear to auscultation bilaterally with normal respiratory effort. CV: Nondisplaced PMI.  Heart regular S1/S2, no S3/S4, no murmur.  1+ ankle edema.   Abdomen: Soft, nontender, no hepatosplenomegaly, no distention.  Skin: Intact without lesions or rashes.  Neurologic: Alert and oriented x 3.  Psych: Normal affect. Extremities: No clubbing or cyanosis.  HEENT: Normal.   Telemetry   NSR 70-80s (personally reviewed)   Labs    CBC Recent Labs    09/07/19 0349 09/08/19 0518  WBC 8.7 8.0  HGB 9.4* 9.2*  HCT 30.9* 29.8*  MCV 99.7 99.3  PLT 40* 28*   Basic Metabolic Panel Recent Labs    09/07/19 0349 09/08/19 0518  NA 134* 138  K 4.6 4.1  CL 97* 99  CO2 24 28  GLUCOSE 154* 154*  BUN 42* 27*  CREATININE 3.68* 2.97*  CALCIUM 9.0 8.7*    PHOS 3.9 3.7   Liver Function Tests Recent Labs    09/07/19 0349 09/08/19 0518  ALBUMIN 3.3* 3.3*   No results for input(s): LIPASE, AMYLASE in the last 72 hours. Cardiac Enzymes No results for input(s): CKTOTAL, CKMB, CKMBINDEX, TROPONINI in the last 72 hours.  BNP: BNP (last 3 results) Recent Labs    08/13/19 1437 08/16/19 1659 08/17/19 0714  BNP 1,224.9* 1,081.1* 1,056.0*    ProBNP (last 3 results) Recent Labs    05/21/19 1535  PROBNP 6,191*     D-Dimer No results for input(s): DDIMER in the last 72 hours. Hemoglobin A1C No results for input(s): HGBA1C in the last 72 hours. Fasting Lipid Panel No results for input(s): CHOL, HDL, LDLCALC, TRIG, CHOLHDL, LDLDIRECT in the last 72 hours. Thyroid Function Tests No results for input(s): TSH, T4TOTAL, T3FREE, THYROIDAB in the last 72 hours.  Invalid input(s): FREET3  Other results:   Imaging    No results found.   Medications:     Scheduled Medications: . sodium chloride   Intravenous Once  . allopurinol  50 mg Oral QODAY  . amiodarone  200 mg Oral Daily  . Chlorhexidine Gluconate Cloth  6 each Topical Q0600  . Chlorhexidine Gluconate Cloth  6 each Topical Q0600  . [  START ON 09/10/2019] darbepoetin (ARANESP) injection - DIALYSIS  60 mcg Intravenous Q Mon-HD  . docusate sodium  200 mg Oral Daily  . folic acid-pyridoxine-cyancobalamin  1 tablet Oral Daily  . melatonin  6 mg Oral QHS  . midodrine  15 mg Oral TID with meals  . multivitamin  1 tablet Oral QHS  . polyethylene glycol  17 g Oral Daily  . pramipexole  0.25 mg Oral BID  . predniSONE  100 mg Oral Q breakfast  . rosuvastatin  10 mg Oral q1800  . sevelamer carbonate  2,400 mg Oral TID WC  . sodium chloride flush  10-40 mL Intracatheter Q12H  . sodium chloride flush  3 mL Intravenous Q12H  . tamsulosin  0.4 mg Oral Daily    Infusions: . sodium chloride Stopped (08/19/19 1323)  . ferric gluconate (FERRLECIT/NULECIT) IV Stopped (09/07/19  1849)    PRN Medications: sodium chloride, acetaminophen, HYDROmorphone (DILAUDID) injection, sodium chloride flush, sodium chloride flush   Assessment/Plan   1. Acute on chronic systolic CHF: Ischemic cardiomyopathy.  St Jude ICD.  Echo in 2/21 with EF <20%, mildly dilated RV with moderately decreased systolic function, mild AS, mild-moderate MR, severe TR.  CHF is complicated by cardiorenal syndrome and recurrent atrial fibrillation with RVR.  Suspect low output HF.  He was on milrinone but now off.  Maintaining NSR. He has started HD for volume management.  CVP still very high at 27 but symptomatically, he is actually doing quite well.  Suspect that CVP is going to remain at least moderately high with severe TR and RV failure. Continue midodrine to maintain BP.  - Continue to dialyze as aggressively as possible to get fluid off, would ideally be dialyzed again this weekend.   2. CAD: S/p remote CABG (1989).  He does not think he has had a coronary evaluation since that time.  No chest pain.  HS-TnI not elevated. No chest pain.   - Continue Crestor.  - No ASA given anticoagulation.  3. Atrial fibrillation: Paroxysmal.  He was admitted with atrial fibrillation with RVR.  This may have triggered his CHF exacerbation (though based on device interrogation, worsening volume status does not seem to necessarily track with atrial fibrillation) or may be a consequence of the exacerbation.  TEE-guided DCCV done 3/24, remains in NSR.  - Continue amio 200 mg daily.   - Hold Eliquis with very low plts (see below).   - ECG today.   4. AKI on CKD stage 4-> ESRD. BP remaining stable at HD with midodrine use.  He has tolerated HD well so far.  Needs ongoing volume removal.  May be able to go home after HD on Monday as he has outpatient seat arranged.  Will ideally get HD 4 days/week.  5. PAD: Patient reports peripheral intervention though I do not see this is in history.  6. CVA: In past. Likely related to  atrial fibrillation.  - Restart Eliquis when plts improved.  7. Thrombocytopenia: Plts on admit 75 K. History of ITP, follows with hematology as outpatient.  HIT negative. He has had 1 unit plt transfusion.  Today, plts remain low at 28K. Hematology has seen in consult, prednisone started.   - Continue prednisone per hematology.  - Transfuse plts < 10. - Restart Eliquis once platelets stable, ideally > 40-50K. Will use SCDs for DVT prophylaxis.    8. Deconditioning: Doing well on hall walks.  - PT recommending HHPT. HH ordered.   Length of Stay: 23  Loralie Champagne, MD  09/08/2019, 11:55 AM

## 2019-09-09 LAB — COOXEMETRY PANEL
Carboxyhemoglobin: 1.5 % (ref 0.5–1.5)
Methemoglobin: 1.2 % (ref 0.0–1.5)
O2 Saturation: 53 %
Total hemoglobin: 10 g/dL — ABNORMAL LOW (ref 12.0–16.0)

## 2019-09-09 LAB — CBC
HCT: 31.6 % — ABNORMAL LOW (ref 39.0–52.0)
Hemoglobin: 9.7 g/dL — ABNORMAL LOW (ref 13.0–17.0)
MCH: 30.3 pg (ref 26.0–34.0)
MCHC: 30.7 g/dL (ref 30.0–36.0)
MCV: 98.8 fL (ref 80.0–100.0)
Platelets: 35 10*3/uL — ABNORMAL LOW (ref 150–400)
RBC: 3.2 MIL/uL — ABNORMAL LOW (ref 4.22–5.81)
RDW: 16.6 % — ABNORMAL HIGH (ref 11.5–15.5)
WBC: 8.2 10*3/uL (ref 4.0–10.5)
nRBC: 0 % (ref 0.0–0.2)

## 2019-09-09 LAB — RENAL FUNCTION PANEL
Albumin: 3.3 g/dL — ABNORMAL LOW (ref 3.5–5.0)
Anion gap: 11 (ref 5–15)
BUN: 37 mg/dL — ABNORMAL HIGH (ref 8–23)
CO2: 27 mmol/L (ref 22–32)
Calcium: 9 mg/dL (ref 8.9–10.3)
Chloride: 97 mmol/L — ABNORMAL LOW (ref 98–111)
Creatinine, Ser: 3.87 mg/dL — ABNORMAL HIGH (ref 0.61–1.24)
GFR calc Af Amer: 15 mL/min — ABNORMAL LOW (ref >60)
GFR calc non Af Amer: 13 mL/min — ABNORMAL LOW (ref >60)
Glucose, Bld: 177 mg/dL — ABNORMAL HIGH (ref 70–99)
Phosphorus: 3.2 mg/dL (ref 2.5–4.6)
Potassium: 4.3 mmol/L (ref 3.5–5.1)
Sodium: 135 mmol/L (ref 135–145)

## 2019-09-09 MED ORDER — CHLORHEXIDINE GLUCONATE CLOTH 2 % EX PADS
6.0000 | MEDICATED_PAD | Freq: Every day | CUTANEOUS | Status: DC
  Administered 2019-09-10: 6 via TOPICAL

## 2019-09-09 NOTE — Progress Notes (Signed)
Patient ID: Ricardo Payne, male   DOB: Nov 12, 1930, 86 y.o.   MRN: [id number]     Advanced Heart Failure Rounding Note  PCP-Cardiologist: Donato Heinz, MD   Subjective:    Weight back up with no HD last 2 days.  CVP 30.  He is comfortable, no complaints.    Platelets 31>21>17>18> transfused 1 unit plts > 40 > 28 > 35.  Prednisone ongoing.  No overt bleeding.   Walking in hall without difficulty.    Objective:   Weight Range: 98.5 kg Body mass index is 35.04 kg/m.   Vital Signs:   Temp:  [97.6 F (36.4 C)-97.9 F (36.6 C)] 97.6 F (36.4 C) (04/11 0006) Pulse Rate:  [75] 75 (04/10 1651) Resp:  [19-20] 20 (04/10 1651) BP: (106-126)/(59-69) 126/59 (04/10 1651) SpO2:  [97 %] 97 % (04/10 1651) Weight:  [98.5 kg] 98.5 kg (04/11 0500) Last BM Date: 09/08/19  Weight change: Filed Weights   09/07/19 1734 09/08/19 0246 09/09/19 0500  Weight: 95.5 kg 96.1 kg 98.5 kg    Intake/Output:   Intake/Output Summary (Last 24 hours) at 09/09/2019 1121 Last data filed at 09/08/2019 2125 Gross per 24 hour  Intake 443 ml  Output 100 ml  Net 343 ml      Physical Exam   CVP 30 General: NAD Neck: JVP 16+, no thyromegaly or thyroid nodule.  Lungs: Clear to auscultation bilaterally with normal respiratory effort. CV: Nondisplaced PMI.  Heart regular S1/S2, no S3/S4, no murmur.  1+ edema ankles.   Abdomen: Soft, nontender, no hepatosplenomegaly, no distention.  Skin: Intact without lesions or rashes.  Neurologic: Alert and oriented x 3.  Psych: Normal affect. Extremities: No clubbing or cyanosis.  HEENT: Normal.    Telemetry   NSR 70-80s (personally reviewed)   Labs    CBC Recent Labs    09/08/19 0518 09/09/19 0500  WBC 8.0 8.2  HGB 9.2* 9.7*  HCT 29.8* 31.6*  MCV 99.3 98.8  PLT 28* 35*   Basic Metabolic Panel Recent Labs    09/08/19 0518 09/09/19 0500  NA 138 135  K 4.1 4.3  CL 99 97*  CO2 28 27  GLUCOSE 154* 177*  BUN 27* 37*  CREATININE  2.97* 3.87*  CALCIUM 8.7* 9.0  PHOS 3.7 3.2   Liver Function Tests Recent Labs    09/08/19 0518 09/09/19 0500  ALBUMIN 3.3* 3.3*   No results for input(s): LIPASE, AMYLASE in the last 72 hours. Cardiac Enzymes No results for input(s): CKTOTAL, CKMB, CKMBINDEX, TROPONINI in the last 72 hours.  BNP: BNP (last 3 results) Recent Labs    08/13/19 1437 08/16/19 1659 08/17/19 0714  BNP 1,224.9* 1,081.1* 1,056.0*    ProBNP (last 3 results) Recent Labs    05/21/19 1535  PROBNP 6,191*     D-Dimer No results for input(s): DDIMER in the last 72 hours. Hemoglobin A1C No results for input(s): HGBA1C in the last 72 hours. Fasting Lipid Panel No results for input(s): CHOL, HDL, LDLCALC, TRIG, CHOLHDL, LDLDIRECT in the last 72 hours. Thyroid Function Tests No results for input(s): TSH, T4TOTAL, T3FREE, THYROIDAB in the last 72 hours.  Invalid input(s): FREET3  Other results:   Imaging    No results found.   Medications:     Scheduled Medications: . sodium chloride   Intravenous Once  . allopurinol  50 mg Oral QODAY  . amiodarone  200 mg Oral Daily  . Chlorhexidine Gluconate Cloth  6 each Topical Q0600  .  Chlorhexidine Gluconate Cloth  6 each Topical Q0600  . [START ON 09/10/2019] darbepoetin (ARANESP) injection - DIALYSIS  60 mcg Intravenous Q Mon-HD  . docusate sodium  200 mg Oral Daily  . folic acid-pyridoxine-cyancobalamin  1 tablet Oral Daily  . melatonin  6 mg Oral QHS  . midodrine  15 mg Oral TID with meals  . multivitamin  1 tablet Oral QHS  . polyethylene glycol  17 g Oral Daily  . pramipexole  0.25 mg Oral BID  . predniSONE  100 mg Oral Q breakfast  . rosuvastatin  10 mg Oral q1800  . sevelamer carbonate  2,400 mg Oral TID WC  . sodium chloride flush  10-40 mL Intracatheter Q12H  . sodium chloride flush  3 mL Intravenous Q12H  . tamsulosin  0.4 mg Oral Daily    Infusions: . sodium chloride Stopped (08/19/19 1323)  . ferric gluconate  (FERRLECIT/NULECIT) IV Stopped (09/07/19 1849)    PRN Medications: sodium chloride, acetaminophen, HYDROmorphone (DILAUDID) injection, sodium chloride flush, sodium chloride flush   Assessment/Plan   1. Acute on chronic systolic CHF: Ischemic cardiomyopathy.  St Jude ICD.  Echo in 2/21 with EF <20%, mildly dilated RV with moderately decreased systolic function, mild AS, mild-moderate MR, severe TR.  CHF is complicated by cardiorenal syndrome and recurrent atrial fibrillation with RVR.  Suspect low output HF.  He was on milrinone but now off.  Maintaining NSR. He has started HD for volume management.  CVP still very high at 30 (no HD x 2 days) but symptomatically, he is actually doing quite well.  Suspect that CVP is going to remain at least moderately high with severe TR and RV failure. Continue midodrine to maintain BP.  - Continue to dialyze as aggressively as possible to get fluid off, will need 4 days/week HD. To get HD again tomorrow, would push HD as aggressively as possible.  2. CAD: S/p remote CABG (1989).  He does not think he has had a coronary evaluation since that time.  No chest pain.  HS-TnI not elevated. No chest pain.   - Continue Crestor.  - No ASA given anticoagulation.  3. Atrial fibrillation: Paroxysmal.  He was admitted with atrial fibrillation with RVR.  This may have triggered his CHF exacerbation (though based on device interrogation, worsening volume status does not seem to necessarily track with atrial fibrillation) or may be a consequence of the exacerbation.  TEE-guided DCCV done 3/24, remains in NSR.  - Continue amio 200 mg daily.   - Hold Eliquis with very low plts (see below).   4. AKI on CKD stage 4-> ESRD. BP remaining stable at HD with midodrine use.  He has tolerated HD well so far.  Needs ongoing volume removal.  May be able to go home after HD on Monday as he has outpatient seat arranged.  Will ideally get HD 4 days/week.  5. PAD: Patient reports peripheral  intervention though I do not see this is in history.  6. CVA: In past. Likely related to atrial fibrillation.  - Restart Eliquis when plts improved.  7. Thrombocytopenia: Plts on admit 75 K. History of ITP, follows with hematology as outpatient.  HIT negative. He has had 1 unit plt transfusion.  Today, plts improving at 35K. Hematology has seen in consult, prednisone started.   - Continue prednisone per hematology.  - Transfuse plts < 10. - Restart Eliquis once platelets stable, ideally > 40-50K. Will use SCDs for DVT prophylaxis.    8. Deconditioning:  Doing well on hall walks.  - PT recommending HHPT. HH ordered.   Length of Stay: 24  Loralie Champagne, MD  09/09/2019, 11:21 AM

## 2019-09-09 NOTE — Progress Notes (Signed)
[facility] NEPHROLOGY PROGRESS NOTE  Assessment/ Plan: Pt is a 86 y.o. yo male with ICM, EF less than 20%, dilated RV, CAD, AKI on CKD now dialysis dependent.  #Acute kidney injury on CKD stage IV due to cardiorenal syndrome: Started dialysis on 3/26.  Seen by vascular who placed tunneled dialysis catheter, no plan for permanent access because of multiple comorbidities and technical difficulties.  Cardiology note noted.  Patient received serial HD on 4/5-7 with ultrafiltration, tolerated well. Last HD on 4/9 with 3 L UF, tolerated well.  Clinically looks much better. Plan for next HD on 4/12 as per MWF schedule. SW consulted to arrange for OP HD unit. First OP tx will be on 4/14.  #Acute on chronic CHF: Did not respond with inotropic support and diuretics.  EF is very low.  CVP 31 per cardiology.  Now volume status managed with dialysis as discussed above.  Cardiology following.  # Anemia of CKD: Saturation 12%. Continue IV iron and ESA.  Monitor Hb.  # Secondary hyperparathyroidism: Phosphorus at goal.  Continue Renvela.  # Hypotension/volume; monitor blood pressure.  UF during HD.  On midodrine during HD.  #Thrombocytopenia likely ITP per oncology.  On prednisone now.  Received platelet on 4/8.  Discussed with Dr. Aundra Dubin.  Subjective: Seen and examined.  No new event.  Denies nausea vomiting chest pain or shortness of breath.  Plan for next HD tomorrow.  Objective Vital signs in last 24 hours: Vitals:   09/08/19 1204 09/08/19 1651 09/09/19 0006 09/09/19 0500  BP: 106/69 (!) 126/59    Pulse:  75    Resp: 19 20    Temp: 97.8 F (36.6 C) 97.9 F (36.6 C) 97.6 F (36.4 C)   TempSrc: Oral Oral    SpO2:  97%    Weight:    98.5 kg  Height:       Weight change: -0.124 kg  Intake/Output Summary (Last 24 hours) at 09/09/2019 6812 Last data filed at 09/08/2019 2125 Gross per 24 hour  Intake 443 ml  Output 100 ml  Net 343 ml       Labs: Basic Metabolic  Panel: Recent Labs  Lab 09/07/19 0349 09/08/19 0518 09/09/19 0500  NA 134* 138 135  K 4.6 4.1 4.3  CL 97* 99 97*  CO2 24 28 27   GLUCOSE 154* 154* 177*  BUN 42* 27* 37*  CREATININE 3.68* 2.97* 3.87*  CALCIUM 9.0 8.7* 9.0  PHOS 3.9 3.7 3.2   Liver Function Tests: Recent Labs  Lab 09/07/19 0349 09/08/19 0518 09/09/19 0500  ALBUMIN 3.3* 3.3* 3.3*   No results for input(s): LIPASE, AMYLASE in the last 168 hours. No results for input(s): AMMONIA in the last 168 hours. CBC: Recent Labs  Lab 09/05/19 0356 09/05/19 0356 09/06/19 0420 09/06/19 0420 09/07/19 0349 09/08/19 0518 09/09/19 0500  WBC 5.8   < > 6.3   < > 8.7 8.0 8.2  HGB 9.1*   < > 9.6*   < > 9.4* 9.2* 9.7*  HCT 29.8*   < > 30.9*   < > 30.9* 29.8* 31.6*  MCV 99.3  --  99.0  --  99.7 99.3 98.8  PLT 17*   < > 18*   < > 40* 28* 35*   < > = values in this interval not displayed.   Cardiac Enzymes: No results for input(s): CKTOTAL, CKMB, CKMBINDEX, TROPONINI in the last 168 hours. CBG: No results for input(s): GLUCAP in the last 168 hours.  Iron Studies:  No results for input(s): IRON, TIBC, TRANSFERRIN, FERRITIN in the last 72 hours. Studies/Results: No results found.  Medications: Infusions: . sodium chloride Stopped (08/19/19 1323)  . ferric gluconate (FERRLECIT/NULECIT) IV Stopped (09/07/19 1849)    Scheduled Medications: . sodium chloride   Intravenous Once  . allopurinol  50 mg Oral QODAY  . amiodarone  200 mg Oral Daily  . Chlorhexidine Gluconate Cloth  6 each Topical Q0600  . [START ON 09/10/2019] darbepoetin (ARANESP) injection - DIALYSIS  60 mcg Intravenous Q Mon-HD  . docusate sodium  200 mg Oral Daily  . folic acid-pyridoxine-cyancobalamin  1 tablet Oral Daily  . melatonin  6 mg Oral QHS  . midodrine  15 mg Oral TID with meals  . multivitamin  1 tablet Oral QHS  . polyethylene glycol  17 g Oral Daily  . pramipexole  0.25 mg Oral BID  . predniSONE  100 mg Oral Q breakfast  . rosuvastatin   10 mg Oral q1800  . sevelamer carbonate  2,400 mg Oral TID WC  . sodium chloride flush  10-40 mL Intracatheter Q12H  . sodium chloride flush  3 mL Intravenous Q12H  . tamsulosin  0.4 mg Oral Daily    have reviewed scheduled and prn medications.  Physical Exam: General:NAD, sitting on bed comfortable. Heart:RRR, s1s2 nl, no rubs Lungs: Clear b/l, no crackle Abdomen:soft, Non-tender, non-distended Extremities: No LE edema Dialysis Access: Right IJ TDC.  Ricardo Payne 09/09/2019,9:22 AM  LOS: 24 days  Pager: [phone]

## 2019-09-10 ENCOUNTER — Other Ambulatory Visit: Payer: Self-pay | Admitting: Oncology

## 2019-09-10 DIAGNOSIS — D696 Thrombocytopenia, unspecified: Secondary | ICD-10-CM

## 2019-09-10 LAB — RENAL FUNCTION PANEL
Albumin: 3.4 g/dL — ABNORMAL LOW (ref 3.5–5.0)
Anion gap: 13 (ref 5–15)
BUN: 52 mg/dL — ABNORMAL HIGH (ref 8–23)
CO2: 26 mmol/L (ref 22–32)
Calcium: 9 mg/dL (ref 8.9–10.3)
Chloride: 96 mmol/L — ABNORMAL LOW (ref 98–111)
Creatinine, Ser: 4.92 mg/dL — ABNORMAL HIGH (ref 0.61–1.24)
GFR calc Af Amer: 11 mL/min — ABNORMAL LOW (ref >60)
GFR calc non Af Amer: 10 mL/min — ABNORMAL LOW (ref >60)
Glucose, Bld: 166 mg/dL — ABNORMAL HIGH (ref 70–99)
Phosphorus: 3.3 mg/dL (ref 2.5–4.6)
Potassium: 4.2 mmol/L (ref 3.5–5.1)
Sodium: 135 mmol/L (ref 135–145)

## 2019-09-10 LAB — CBC
HCT: 31.3 % — ABNORMAL LOW (ref 39.0–52.0)
Hemoglobin: 9.6 g/dL — ABNORMAL LOW (ref 13.0–17.0)
MCH: 30.1 pg (ref 26.0–34.0)
MCHC: 30.7 g/dL (ref 30.0–36.0)
MCV: 98.1 fL (ref 80.0–100.0)
Platelets: 49 10*3/uL — ABNORMAL LOW (ref 150–400)
RBC: 3.19 MIL/uL — ABNORMAL LOW (ref 4.22–5.81)
RDW: 16.8 % — ABNORMAL HIGH (ref 11.5–15.5)
WBC: 8.4 10*3/uL (ref 4.0–10.5)
nRBC: 0 % (ref 0.0–0.2)

## 2019-09-10 LAB — COOXEMETRY PANEL
Carboxyhemoglobin: 1.6 % — ABNORMAL HIGH (ref 0.5–1.5)
Methemoglobin: 1.3 % (ref 0.0–1.5)
O2 Saturation: 60.4 %
Total hemoglobin: 10 g/dL — ABNORMAL LOW (ref 12.0–16.0)

## 2019-09-10 MED ORDER — PREDNISONE 50 MG PO TABS
100.0000 mg | ORAL_TABLET | Freq: Once | ORAL | Status: AC
  Administered 2019-09-10: 100 mg via ORAL
  Filled 2019-09-10: qty 2

## 2019-09-10 MED ORDER — MIDODRINE HCL 5 MG PO TABS
ORAL_TABLET | ORAL | Status: AC
  Administered 2019-09-10: 15 mg via ORAL
  Filled 2019-09-10: qty 3

## 2019-09-10 MED ORDER — LIDOCAINE HCL (PF) 1 % IJ SOLN: 5.0000 mL | INTRAMUSCULAR | Status: DC | PRN

## 2019-09-10 MED ORDER — PREDNISONE 50 MG PO TABS
80.0000 mg | ORAL_TABLET | Freq: Every day | ORAL | Status: DC
  Administered 2019-09-11: 80 mg via ORAL
  Filled 2019-09-10 (×2): qty 1

## 2019-09-10 MED ORDER — SODIUM CHLORIDE 0.9 % IV SOLN: 100.0000 mL | INTRAVENOUS | Status: DC | PRN

## 2019-09-10 MED ORDER — PENTAFLUOROPROP-TETRAFLUOROETH EX AERO: 1.0000 "application " | INHALATION_SPRAY | CUTANEOUS | Status: DC | PRN

## 2019-09-10 MED ORDER — ALTEPLASE 2 MG IJ SOLR: 2.0000 mg | Freq: Once | INTRAMUSCULAR | Status: DC | PRN

## 2019-09-10 MED ORDER — HEPARIN SODIUM (PORCINE) 1000 UNIT/ML IJ SOLN
INTRAMUSCULAR | Status: AC
  Administered 2019-09-10: 1000 [IU] via INTRAVENOUS_CENTRAL
  Filled 2019-09-10: qty 4

## 2019-09-10 MED ORDER — DARBEPOETIN ALFA 60 MCG/0.3ML IJ SOSY
PREFILLED_SYRINGE | INTRAMUSCULAR | Status: AC
  Administered 2019-09-10: 60 ug via INTRAVENOUS
  Filled 2019-09-10: qty 0.3

## 2019-09-10 MED ORDER — HEPARIN SODIUM (PORCINE) 1000 UNIT/ML DIALYSIS: 1000.0000 [IU] | INTRAMUSCULAR | Status: DC | PRN

## 2019-09-10 MED ORDER — LIDOCAINE-PRILOCAINE 2.5-2.5 % EX CREA: 1.0000 "application " | TOPICAL_CREAM | CUTANEOUS | Status: DC | PRN

## 2019-09-10 NOTE — Progress Notes (Signed)
PT Cancellation Note  Patient Details Name: Ricardo Payne MRN: [id number] DOB: July 03, 1930   Cancelled Treatment:    Reason Eval/Treat Not Completed: (P) Patient at procedure or test/unavailable Pt is off floor for HD. PT will follow back this afternoon for treatment as able.  Alise Calais B. Migdalia Dk PT, DPT Acute Rehabilitation Services Pager [phone] Office [phone]    [place] 09/10/2019, 7:45 AM

## 2019-09-10 NOTE — Procedures (Signed)
Patient seen on Hemodialysis. BP (!) 117/59   Pulse 76   Temp 98.5 F (36.9 C) (Oral)   Resp 19   Ht 5\' 6"  (1.676 m)   Wt 100.4 kg   SpO2 100%   BMI 35.73 kg/m   QB 400, UF goal 4L Tolerating treatment without complaints at this time.  Accepted to [facility] dialysis unit but can not be accommodated for 4 treatments a week. Renal navigator trying to work towards placement at unit that can do this.   Elmarie Shiley MD [facility]. Office # [phone] Pager # [phone] 9:29 AM

## 2019-09-10 NOTE — Progress Notes (Signed)
Physical Therapy Treatment Patient Details Name: Ricardo Payne MRN: [id number] DOB: [birth year] Today's Date: 09/10/2019    History of Present Illness Patient is a 86 y/o male who presents with LE swelling and SOB. CXR-pulmonary edema. Admitted with Acute Decompensated Systolic HF. PMH includes CKD, ischemic heart disease, HTN, PAF, HLD, ICD, CAD s/p CABG, two recent admission for ADHF and volume overload (06/2019, 07/2019).    PT Comments    Pt naked in bed reporting he is very hot after HD. Pt reports he will go walking with therapy if he gets two gowns. Pt is making good progress towards his goal, however is limited in safe mobility by 3/4 DoE with ambulation requiring standing rest breaks as well as decreased strength and balance. Pt requires increased time and effort for bed mobility and transfers and supervision for ambulation without AD. Pt reports he missed therapy over the weekend because he feels he is a little weaker today. Educated pt on weekend caseload. D/c plans remain appropriate at this time. PT will continue to follow acutely.    Follow Up Recommendations  Home health PT     Equipment Recommendations  None recommended by PT       Precautions / Restrictions Precautions Precautions: Fall Precaution Comments: watch HR, A-fib Restrictions Weight Bearing Restrictions: No    Mobility  Bed Mobility   Bed Mobility: Supine to Sit     Supine to sit: Modified independent (Device/Increase time);HOB elevated Sit to supine: Modified independent (Device/Increase time);HOB elevated   General bed mobility comments: use of rail and increased time  Transfers Overall transfer level: Modified independent   Transfers: Sit to/from Stand Sit to Stand: Modified independent (Device/Increase time)         General transfer comment: min cues for safety with lines/leads  Ambulation/Gait Ambulation/Gait assistance: Supervision Gait Distance (Feet): 275 Feet Assistive device:  None Gait Pattern/deviations: Step-through pattern;Wide base of support;Decreased stride length     General Gait Details: supervision for safety with mildly unsteady gait, no overt LoB, cues for decreasing ambulation velocity to improve energy conservation with ambulation          Balance Overall balance assessment: Needs assistance Sitting-balance support: No upper extremity supported;Feet supported Sitting balance-Leahy Scale: Normal     Standing balance support: During functional activity;No upper extremity supported Standing balance-Leahy Scale: Good                              Cognition Arousal/Alertness: Awake/alert Behavior During Therapy: WFL for tasks assessed/performed Overall Cognitive Status: Within Functional Limits for tasks assessed                                           General Comments General comments (skin integrity, edema, etc.): VSS on RA, SaO2 >96%O2 with ambulation       Pertinent Vitals/Pain Pain Assessment: No/denies pain           PT Goals (current goals can now be found in the care plan section) Acute Rehab PT Goals PT Goal Formulation: With patient Time For Goal Achievement: 09/18/19 Potential to Achieve Goals: Good Progress towards PT goals: Progressing toward goals    Frequency    Min 3X/week      PT Plan Current plan remains appropriate       AM-PAC PT "6 Clicks" Mobility  Outcome Measure  Help needed turning from your back to your side while in a flat bed without using bedrails?: None Help needed moving from lying on your back to sitting on the side of a flat bed without using bedrails?: A Little Help needed moving to and from a bed to a chair (including a wheelchair)?: None Help needed standing up from a chair using your arms (e.g., wheelchair or bedside chair)?: None Help needed to walk in hospital room?: None Help needed climbing 3-5 steps with a railing? : A Little 6 Click Score:  22    End of Session Equipment Utilized During Treatment: Gait belt Activity Tolerance: Patient tolerated treatment well Patient left: in bed;with call bell/phone within reach Nurse Communication: Mobility status PT Visit Diagnosis: Difficulty in walking, not elsewhere classified (R26.2)     Time: 8366-2947 PT Time Calculation (min) (ACUTE ONLY): 25 min  Charges:  $Gait Training: 23-37 mins                     Jakye Mullens B. Migdalia Dk PT, DPT Acute Rehabilitation Services Pager [phone] Office [phone]    [place] 09/10/2019, 3:15 PM

## 2019-09-10 NOTE — TOC Progression Note (Signed)
Transition of Care [facility]) - Progression Note    Patient Details  Name: Ricardo Payne MRN: [id number] Date of Birth: 06-30-1930  Transition of Care [facility]) CM/SW [place], [place] Phone Number: 09/10/2019, 3:15 PM  Clinical Narrative:     [facility] team continues to follow for discharge planning, outpatient palliative referral made and Audrea Muscat with [facility] informed of potential discharge home tomorrow.   Patient is set up with [facility] home health for PT and RN, no DME needs identified at this time.     Expected Discharge Plan: [place] Barriers to Discharge: Continued Medical Work up  Expected Discharge Plan and Services Expected Discharge Plan: [place] In-house Referral: Clinical Social Work   Post Acute Care Choice: [place] arrangements for the past 2 months: [place]: PT, RN [place] Agency: [place] Date [place]: 08/20/19 Time Agency: [id number] Representative spoke with at [place]: [place] Determinants of Health ([place]) Interventions    Readmission Risk Interventions No flowsheet data found.

## 2019-09-10 NOTE — Progress Notes (Addendum)
Patient ID: Ricardo Payne, male   DOB: [birth year], 86 y.o.   MRN: [id number]     Advanced Heart Failure Rounding Note  PCP-Cardiologist: Donato Heinz, MD   Subjective:    Weight down 8 pounds. Tolerating iHD.  CVP 20.    Platelets 31>21>17>18> transfused 1 unit plts > 40 > 28 > 35>49.  Prednisone ongoing.  No overt bleeding.   Denies SOB. Able to walk in the hall.    Objective:   Weight Range: 96.9 kg Body mass index is 34.48 kg/m.   Vital Signs:   Temp:  [97.7 F (36.5 C)-98.6 F (37 C)] 98.6 F (37 C) (04/12 1043) Pulse Rate:  [65-78] 75 (04/12 1043) Resp:  [18-21] 18 (04/12 1043) BP: (95-135)/(49-66) 103/51 (04/12 1043) SpO2:  [98 %-100 %] 98 % (04/12 1043) Weight:  [96.9 kg-100.4 kg] 96.9 kg (04/12 1043) Last BM Date: 09/08/19  Weight change: Filed Weights   09/10/19 0645 09/10/19 0710 09/10/19 1043  Weight: 99.5 kg 100.4 kg 96.9 kg    Intake/Output:   Intake/Output Summary (Last 24 hours) at 09/10/2019 1240 Last data filed at 09/10/2019 1043 Gross per 24 hour  Intake 490 ml  Output 3501 ml  Net -3011 ml      Physical Exam   CVP 20-21 General:  Well appearing. No resp difficulty HEENT: normal Neck: supple. JVP to to jaw . Carotids 2+ bilat; no bruits. No lymphadenopathy or thryomegaly appreciated. Cor: PMI nondisplaced. Regular rate & rhythm. No rubs, gallops or murmurs. R HD catheter.  Lungs: clear Abdomen: soft, nontender, nondistended. No hepatosplenomegaly. No bruits or masses. Good bowel sounds. Extremities: no cyanosis, clubbing, rash, R and LLE trace edema Neuro: alert & orientedx3, cranial nerves grossly intact. moves all 4 extremities w/o difficulty. Affect pleasant      Telemetry   NSR 70-80s personally reviewed.    Labs    CBC Recent Labs    09/09/19 0500 09/10/19 0500  WBC 8.2 8.4  HGB 9.7* 9.6*  HCT 31.6* 31.3*  MCV 98.8 98.1  PLT 35* 49*   Basic Metabolic Panel Recent Labs    09/09/19 0500  09/10/19 0500  NA 135 135  K 4.3 4.2  CL 97* 96*  CO2 27 26  GLUCOSE 177* 166*  BUN 37* 52*  CREATININE 3.87* 4.92*  CALCIUM 9.0 9.0  PHOS 3.2 3.3   Liver Function Tests Recent Labs    09/09/19 0500 09/10/19 0500  ALBUMIN 3.3* 3.4*   No results for input(s): LIPASE, AMYLASE in the last 72 hours. Cardiac Enzymes No results for input(s): CKTOTAL, CKMB, CKMBINDEX, TROPONINI in the last 72 hours.  BNP: BNP (last 3 results) Recent Labs    08/13/19 1437 08/16/19 1659 08/17/19 0714  BNP 1,224.9* 1,081.1* 1,056.0*    ProBNP (last 3 results) Recent Labs    05/21/19 1535  PROBNP 6,191*     D-Dimer No results for input(s): DDIMER in the last 72 hours. Hemoglobin A1C No results for input(s): HGBA1C in the last 72 hours. Fasting Lipid Panel No results for input(s): CHOL, HDL, LDLCALC, TRIG, CHOLHDL, LDLDIRECT in the last 72 hours. Thyroid Function Tests No results for input(s): TSH, T4TOTAL, T3FREE, THYROIDAB in the last 72 hours.  Invalid input(s): FREET3  Other results:   Imaging    No results found.   Medications:     Scheduled Medications: . sodium chloride   Intravenous Once  . allopurinol  50 mg Oral QODAY  . amiodarone  200 mg Oral Daily  .  Chlorhexidine Gluconate Cloth  6 each Topical Q0600  . Chlorhexidine Gluconate Cloth  6 each Topical Q0600  . darbepoetin (ARANESP) injection - DIALYSIS  60 mcg Intravenous Q Mon-HD  . docusate sodium  200 mg Oral Daily  . folic acid-pyridoxine-cyancobalamin  1 tablet Oral Daily  . melatonin  6 mg Oral QHS  . midodrine  15 mg Oral TID with meals  . multivitamin  1 tablet Oral QHS  . polyethylene glycol  17 g Oral Daily  . pramipexole  0.25 mg Oral BID  . predniSONE  100 mg Oral Q breakfast  . rosuvastatin  10 mg Oral q1800  . sevelamer carbonate  2,400 mg Oral TID WC  . sodium chloride flush  10-40 mL Intracatheter Q12H  . sodium chloride flush  3 mL Intravenous Q12H  . tamsulosin  0.4 mg Oral Daily     Infusions: . sodium chloride Stopped (08/19/19 1323)  . ferric gluconate (FERRLECIT/NULECIT) IV 125 mg (09/10/19 1105)    PRN Medications: sodium chloride, acetaminophen, HYDROmorphone (DILAUDID) injection, sodium chloride flush, sodium chloride flush   Assessment/Plan   1. Acute on chronic systolic CHF: Ischemic cardiomyopathy.  St Jude ICD.  Echo in 2/21 with EF <20%, mildly dilated RV with moderately decreased systolic function, mild AS, mild-moderate MR, severe TR.  CHF is complicated by cardiorenal syndrome and recurrent atrial fibrillation with RVR.  Suspect low output HF.  He was on milrinone but now off.  Maintaining NSR. He has started HD for volume management.  CVP still very high at 30 (no HD x 2 days) but symptomatically, he is actually doing quite well.  Suspect that CVP is going to remain at least moderately high with severe TR and RV failure. CVP 21 which is the lowest he has been.  -Continue midodrine to maintain BP.  - Continue to dialyze as aggressively as possible to get fluid off, will need 4 days/week HD. To get HD again tomorrow, would push HD as aggressively as possible.  2. CAD: S/p remote CABG (1989).  He does not think he has had a coronary evaluation since that time.  HS-TnI not elevated.  - No chest pain No chest pain.   - Continue Crestor.  - No ASA given anticoagulation.  3. Atrial fibrillation: Paroxysmal.  He was admitted with atrial fibrillation with RVR.  This may have triggered his CHF exacerbation (though based on device interrogation, worsening volume status does not seem to necessarily track with atrial fibrillation) or may be a consequence of the exacerbation.  TEE-guided DCCV done 3/24, remains in NSR.  - Continue amio 200 mg daily.   - Hold Eliquis with very low plts (see below).   4. AKI on CKD stage 4-> ESRD. BP remaining stable at HD with midodrine use.  He has tolerated HD well so far.  Needs ongoing volume removal.  May be able to go home after  HD on Monday as he has outpatient seat arranged.  Will ideally get HD 4 days/week.  Renal navigator working on this.  5. PAD: Patient reports peripheral intervention though I do not see this is in history.  6. CVA: In past. Likely related to atrial fibrillation.  - Restart Eliquis when plts improved.  7. Thrombocytopenia: Plts on admit 75 K. History of ITP, follows with hematology as outpatient.  HIT negative. He has had 1 unit plt transfusion.  Today, plts improving at 49 . Hematology has seen in consult, prednisone started.   - Continue prednisone per hematology.  -  Transfuse plts < 10. -Consider eliquis tomorrow.    8. Deconditioning: Doing well on hall walks.  - PT recommending HHPT. HH ordered.   Palliative Care will follow in the community. [facility] will follow for [facility] and HHPT. Anticipate D/C tomorrow.    Length of Stay: [place], NP  09/10/2019, 12:40 PM  Patient seen with NP, agree with the above note.   Patient had HD today, CVP down to 20 (lowest it has been).  He feels good. He remains in NSR.    General: NAD Neck: JVP 16 cm, no thyromegaly or thyroid nodule.  Lungs: Clear to auscultation bilaterally with normal respiratory effort. CV: Lateral PMI.  Heart regular S1/S2, no S3/S4, 2/6 HSM LLSB/apex.  No peripheral edema.   Abdomen: Soft, nontender, no hepatosplenomegaly, no distention.  Skin: Intact without lesions or rashes.  Neurologic: Alert and oriented x 3.  Psych: Normal affect. Extremities: No clubbing or cyanosis.  HEENT: Normal.   Platelets improving, continue prednisone per hematology.  Will restart Eliquis likely tomorrow when plts > 50K.   He will ideally have HD 4x/week, may not be able to get this initially.  Continue to pull aggressively at HD.    He remains in NSR, on amiodarone 200 mg daily.   Loralie Champagne 09/10/2019 3:12 PM

## 2019-09-10 NOTE — Progress Notes (Signed)
HEMATOLOGY-ONCOLOGY PROGRESS NOTE  SUBJECTIVE: Feels well today.  Denies bleeding.  Had HD this morning and tolerated it well overall.  Planning for discharge to home tomorrow.  REVIEW OF SYSTEMS:   Noncontributory except as noted in the HPI.  I have reviewed the past medical history, past surgical history, social history and family history with the patient and they are unchanged from previous note.  PHYSICAL EXAMINATION:  Vitals:   09/10/19 1251 09/10/19 1252  BP:  (!) 116/52  Pulse:    Resp:    Temp: 98 F (36.7 C)   SpO2:     Filed Weights   09/10/19 0645 09/10/19 0710 09/10/19 1043  Weight: 99.5 kg 100.4 kg 96.9 kg    Intake/Output from previous day: 04/11 0701 - 04/12 0700 In: 850 [P.O.:840; I.V.:10] Out: 100 [Urine:100]  GENERAL:alert, no distress and comfortable SKIN: Scattered ecchymoses over the bilateral upper extremities.  No petechiae.   LUNGS: clear to auscultation and percussion with normal breathing effort HEART: regular rate & rhythm and no murmurs and trace ankle edema ABDOMEN:abdomen soft, non-tender and normal bowel sounds Musculoskeletal:no cyanosis of digits and no clubbing  NEURO: alert & oriented x 3 with fluent speech, no focal motor/sensory deficits  LABORATORY DATA:  I have reviewed the data as listed CMP Latest Ref Rng & Units 09/10/2019 09/09/2019 09/08/2019  Glucose 70 - 99 mg/dL 166(H) 177(H) 154(H)  BUN 8 - 23 mg/dL 52(H) 37(H) 27(H)  Creatinine 0.61 - 1.24 mg/dL 4.92(H) 3.87(H) 2.97(H)  Sodium 135 - 145 mmol/L 135 135 138  Potassium 3.5 - 5.1 mmol/L 4.2 4.3 4.1  Chloride 98 - 111 mmol/L 96(L) 97(L) 99  CO2 22 - 32 mmol/L 26 27 28   Calcium 8.9 - 10.3 mg/dL 9.0 9.0 8.7(L)  Total Protein 6.5 - 8.1 g/dL - - -  Total Bilirubin 0.3 - 1.2 mg/dL - - -  Alkaline Phos 38 - 126 U/L - - -  AST 15 - 41 U/L - - -  ALT 0 - 44 U/L - - -    Lab Results  Component Value Date   WBC 8.4 09/10/2019   HGB 9.6 (L) 09/10/2019   HCT 31.3 (L) 09/10/2019    MCV 98.1 09/10/2019   PLT 49 (L) 09/10/2019   NEUTROABS 6.5 07/18/2019    DG Chest 2 View  Result Date: 08/16/2019 CLINICAL DATA:  Dyspnea. Fluid retention. Congestive heart failure with bilateral lower extremity and scrotal edema. EXAM: CHEST - 2 VIEW COMPARISON:  July 18, 2019 FINDINGS: Stable mild cardiomegaly with central vascular congestion and mild thickening of peripheral interlobular septa. No obvious pleural effusion. Redemonstration of mediastinal and upper abdominal surgical clips, intact median sternotomy wires, and single left ventricular lead cardiac defibrillator, similarly positioned with left-sided battery pack. Aortic knob calcified atherosclerosis. Streaky consolidation in the bilateral infrahilar regions, likely atelectatic. No pneumonia or pneumothorax. Skeletal degenerative changes and bony demineralization. IMPRESSION: Stable mild cardiomegaly with mild pulmonary edema and chronic thoracic postoperative changes. No pneumonia or obvious pleural effusion. Aortic calcified atherosclerosis. Electronically Signed   By: Revonda Humphrey   On: 08/16/2019 18:00   IR Fluoro Guide CV Line Right  Result Date: 08/23/2019 CLINICAL DATA:  Renal failure. Needs durable venous access for hemodialysis. EXAM: TUNNELED HEMODIALYSIS CATHETER PLACEMENT WITH ULTRASOUND AND FLUOROSCOPIC GUIDANCE TECHNIQUE: The procedure, risks, benefits, and alternatives were explained to the patient. Questions regarding the procedure were encouraged and answered. The patient understands and consents to the procedure. As antibiotic prophylaxis, cefazolin 2 g was  ordered pre-procedure and administered intravenously within one hour of incision. Patency of the right IJ vein was confirmed with ultrasound with image documentation. An appropriate skin site was determined. Region was prepped using maximum barrier technique including cap and mask, sterile gown, sterile gloves, large sterile sheet, and Chlorhexidine as  cutaneous antisepsis. The region was infiltrated locally with 1% lidocaine. Intravenous Fentanyl 66mcg and Versed 0.5mg  were administered as conscious sedation during continuous monitoring of the patient's level of consciousness and physiological / cardiorespiratory status by the radiology RN, with a total moderate sedation time of 13 minutes. Under real-time ultrasound guidance, the right IJ vein was accessed with a 21 gauge micropuncture needle; the needle tip within the vein was confirmed with ultrasound image documentation. Needle exchanged over the 018 guidewire for transitional dilator, which allowed advancement of a Benson wire into the IVC. Over this, an MPA catheter was advanced. A Palindrome 19 hemodialysis catheter was tunneled from the right anterior chest wall approach to the right IJ dermatotomy site. The MPA catheter was exchanged over an Amplatz wire for serial vascular dilators which allow placement of a peel-away sheath, through which the catheter was advanced under intermittent fluoroscopy, positioned with its tips in the proximal and midright atrium. Spot chest radiograph confirms good catheter position. No pneumothorax. Catheter was flushed and primed per protocol. Catheter secured externally with O Prolene sutures. The right IJ dermatotomy site was closed with Dermabond. COMPLICATIONS: COMPLICATIONS None immediate FLUOROSCOPY TIME:  0.2 minutes; 29 uGym2 DAP COMPARISON:  None IMPRESSION: 1. Technically successful placement of tunneled right IJ hemodialysis catheter with ultrasound and fluoroscopic guidance. Ready for routine use. ACCESS: Remains approachable for percutaneous intervention as needed. Electronically Signed   By: Lucrezia Europe M.D.   On: 08/23/2019 15:18   IR US Guide Vasc Access Right  Result Date: 08/23/2019 CLINICAL DATA:  Renal failure. Needs durable venous access for hemodialysis. EXAM: TUNNELED HEMODIALYSIS CATHETER PLACEMENT WITH ULTRASOUND AND FLUOROSCOPIC GUIDANCE  TECHNIQUE: The procedure, risks, benefits, and alternatives were explained to the patient. Questions regarding the procedure were encouraged and answered. The patient understands and consents to the procedure. As antibiotic prophylaxis, cefazolin 2 g was ordered pre-procedure and administered intravenously within one hour of incision. Patency of the right IJ vein was confirmed with ultrasound with image documentation. An appropriate skin site was determined. Region was prepped using maximum barrier technique including cap and mask, sterile gown, sterile gloves, large sterile sheet, and Chlorhexidine as cutaneous antisepsis. The region was infiltrated locally with 1% lidocaine. Intravenous Fentanyl 31mcg and Versed 0.5mg  were administered as conscious sedation during continuous monitoring of the patient's level of consciousness and physiological / cardiorespiratory status by the radiology RN, with a total moderate sedation time of 13 minutes. Under real-time ultrasound guidance, the right IJ vein was accessed with a 21 gauge micropuncture needle; the needle tip within the vein was confirmed with ultrasound image documentation. Needle exchanged over the 018 guidewire for transitional dilator, which allowed advancement of a Benson wire into the IVC. Over this, an MPA catheter was advanced. A Palindrome 19 hemodialysis catheter was tunneled from the right anterior chest wall approach to the right IJ dermatotomy site. The MPA catheter was exchanged over an Amplatz wire for serial vascular dilators which allow placement of a peel-away sheath, through which the catheter was advanced under intermittent fluoroscopy, positioned with its tips in the proximal and midright atrium. Spot chest radiograph confirms good catheter position. No pneumothorax. Catheter was flushed and primed per protocol. Catheter secured externally  with O Prolene sutures. The right IJ dermatotomy site was closed with Dermabond. COMPLICATIONS:  COMPLICATIONS None immediate FLUOROSCOPY TIME:  0.2 minutes; 29 uGym2 DAP COMPARISON:  None IMPRESSION: 1. Technically successful placement of tunneled right IJ hemodialysis catheter with ultrasound and fluoroscopic guidance. Ready for routine use. ACCESS: Remains approachable for percutaneous intervention as needed. Electronically Signed   By: Lucrezia Europe M.D.   On: 08/23/2019 15:18   DG CHEST PORT 1 VIEW  Result Date: 09/01/2019 CLINICAL DATA:  Shortness of breath EXAM: PORTABLE CHEST 1 VIEW COMPARISON:  August 18, 2019 chest radiograph and chest CT July 07, 2019 FINDINGS: There is scarring in the right base. There is no edema or airspace opacity. Heart is mildly enlarged with pulmonary vascularity normal. Patient is status post median sternotomy. Pacemaker lead attached to right ventricle, stable. Central catheter tip in superior vena cava. No pneumothorax. No bone lesions. IMPRESSION: Scarring right base. Lungs otherwise clear. Stable cardiac silhouette. Postoperative changes noted. Central catheter tip in superior vena cava. Electronically Signed   By: Lowella Grip III M.D.   On: 09/01/2019 10:03   DG CHEST PORT 1 VIEW  Result Date: 08/18/2019 CLINICAL DATA:  PICC line placement. EXAM: PORTABLE CHEST 1 VIEW COMPARISON:  August 16, 2019. FINDINGS: Stable cardiomegaly. Atherosclerosis of thoracic aorta is noted. Left-sided pacemaker is unchanged in position. Interval placement of right-sided PICC line with distal tip in expected position of the SVC. No pneumothorax or pleural effusion is noted. Lungs are clear. Bony thorax is unremarkable. IMPRESSION: No acute cardiopulmonary abnormality seen. Interval placement of right-sided PICC line with distal tip in expected position of the SVC. Aortic Atherosclerosis (ICD10-I70.0). Electronically Signed   By: Marijo Conception M.D.   On: 08/18/2019 10:31   ECHO TEE  Result Date: 09/05/2019    TRANSESOPHOGEAL ECHO REPORT   Patient Name:   [name] Date of Exam: 08/22/2019 Medical Rec #:  [id number]               Height:       66.0 in Accession #:    [id number]              Weight:       224.0 lb Date of Birth:  12-Jan-1931                BSA:          2.098 m Patient Age:    5 years                BP:           110/70 mmHg Patient Gender: M                       HR:           94 bpm. Exam Location:  Inpatient Procedure: Transesophageal Echo, Cardiac Doppler and Color Doppler Indications:     Atrial fibrillation  History:         Patient has prior history of Echocardiogram examinations, most                  recent 07/09/2019. CHF, CAD, Prior CABG, Stroke and PAD;                  Arrythmias:Atrial Fibrillation.  Sonographer:     Dustin Flock Referring Phys:  [id number] AMY D CLEGG Diagnosing Phys: Loralie Champagne MD PROCEDURE: The transesophogeal probe was passed without difficulty through  the esophogus of the patient. Sedation performed by performing physician. The patient was monitored while under deep sedation. Anesthestetic sedation was provided intravenously by  Anesthesiology: 93.47mg  of Propofol. The patient developed no complications during the procedure. IMPRESSIONS  1. Left ventricular ejection fraction, by estimation, is <20%. The left ventricle has severely decreased function. The left ventricle demonstrates global hypokinesis. The left ventricular internal cavity size was mildly dilated.  2. Right ventricular systolic function is moderately reduced. The right ventricular size is moderately enlarged. There is moderately elevated pulmonary artery systolic pressure.  3. No ASD or PFO by color doppler. Left atrial size was moderately dilated. No left atrial/left atrial appendage thrombus was detected.  4. Right atrial size was moderately dilated.  5. Peak RV-RA gradient 36 mmHg. Tricuspid valve regurgitation is severe.  6. Moderately calcified aortic valve with trivial regurgitation. There was no significant aortic stenosis, mean gradient 8 mmHg.   7. Moderate mitral regurgitation with restricted posterior mitral leaflet. . The mitral valve is abnormal. Moderate mitral valve regurgitation.  8. Normal caliber thoracic aorta with grade 3 plaque. FINDINGS  Left Ventricle: Left ventricular ejection fraction, by estimation, is <20%. The left ventricle has severely decreased function. The left ventricle demonstrates global hypokinesis. The left ventricular internal cavity size was mildly dilated. There is no  left ventricular hypertrophy. Right Ventricle: The right ventricular size is moderately enlarged. No increase in right ventricular wall thickness. Right ventricular systolic function is moderately reduced. There is moderately elevated pulmonary artery systolic pressure. The tricuspid  regurgitant velocity is 3.01 m/s, and with an assumed right atrial pressure of 10 mmHg, the estimated right ventricular systolic pressure is 78.2 mmHg. Left Atrium: No ASD or PFO by color doppler. Left atrial size was moderately dilated. No left atrial/left atrial appendage thrombus was detected. Right Atrium: Right atrial size was moderately dilated. Pericardium: There is no evidence of pericardial effusion. Mitral Valve: Moderate mitral regurgitation with restricted posterior mitral leaflet. The mitral valve is abnormal. Moderate mitral valve regurgitation. Tricuspid Valve: Peak RV-RA gradient 36 mmHg. The tricuspid valve is normal in structure. Tricuspid valve regurgitation is severe. Aortic Valve: Moderately calcified aortic valve with trivial regurgitation. There was no significant aortic stenosis, mean gradient 8 mmHg. The aortic valve is tricuspid. Aortic valve regurgitation is trivial. Aortic valve mean gradient measures 7.5 mmHg. Aortic valve peak gradient measures 14.3 mmHg. Pulmonic Valve: The pulmonic valve was normal in structure. Pulmonic valve regurgitation is not visualized. Aorta: Normal caliber thoracic aorta with grade 3 plaque. The aortic root is normal in size  and structure. IAS/Shunts: No atrial level shunt detected by color flow Doppler. Additional Comments: A pacer wire is visualized in the right ventricle.  AORTIC VALVE AV Vmax:      189.00 cm/s AV Vmean:     126.000 cm/s AV VTI:       0.314 m AV Peak Grad: 14.3 mmHg AV Mean Grad: 7.5 mmHg TRICUSPID VALVE TR Peak grad:   36.2 mmHg TR Vmax:        301.00 cm/s Loralie Champagne MD Electronically signed by Loralie Champagne MD Signature Date/Time: 09/05/2019/3:42:08 PM    Final    [place] EKG SITE RITE  Result Date: 08/25/2019 If Site Rite image not attached, placement could not be confirmed due to current cardiac rhythm.  [place] EKG SITE RITE  Result Date: 08/17/2019 If Site Rite image not attached, placement could not be confirmed due to current cardiac rhythm.   ASSESSMENT AND PLAN: This is a pleasant 86 year old  male with anemia secondary to iron deficiency and ITP.  He was previously on anticoagulation with Eliquis which has now been stopped secondary to thrombocytopenia.  Additionally, he received heparin during this hospitalization which has now been stopped.  HIT panel was negative.  He was started on prednisone 1 mg/kg daily with improvement of his platelet count to 49,000 today.  We will plan to give prednisone 100 mg p.o. today and then decrease the dose to 80 mg daily.  Okay to resume Eliquis once platelet count is above 50,000. I will arrange for outpatient follow-up at the [facility] early next week to recheck a CBC and for further dose reduction of his prednisone.   LOS: 25 days   Mikey Bussing, DNP, AGPCNP-BC, AOCNP 09/10/19

## 2019-09-11 ENCOUNTER — Telehealth: Payer: Self-pay | Admitting: Internal Medicine

## 2019-09-11 LAB — CBC
HCT: 31.8 % — ABNORMAL LOW (ref 39.0–52.0)
Hemoglobin: 9.8 g/dL — ABNORMAL LOW (ref 13.0–17.0)
MCH: 30.7 pg (ref 26.0–34.0)
MCHC: 30.8 g/dL (ref 30.0–36.0)
MCV: 99.7 fL (ref 80.0–100.0)
Platelets: 46 10*3/uL — ABNORMAL LOW (ref 150–400)
RBC: 3.19 MIL/uL — ABNORMAL LOW (ref 4.22–5.81)
RDW: 17 % — ABNORMAL HIGH (ref 11.5–15.5)
WBC: 8 10*3/uL (ref 4.0–10.5)
nRBC: 0 % (ref 0.0–0.2)

## 2019-09-11 LAB — RENAL FUNCTION PANEL
Albumin: 3.4 g/dL — ABNORMAL LOW (ref 3.5–5.0)
Anion gap: 11 (ref 5–15)
BUN: 32 mg/dL — ABNORMAL HIGH (ref 8–23)
CO2: 26 mmol/L (ref 22–32)
Calcium: 8.6 mg/dL — ABNORMAL LOW (ref 8.9–10.3)
Chloride: 97 mmol/L — ABNORMAL LOW (ref 98–111)
Creatinine, Ser: 3.92 mg/dL — ABNORMAL HIGH (ref 0.61–1.24)
GFR calc Af Amer: 15 mL/min — ABNORMAL LOW (ref >60)
GFR calc non Af Amer: 13 mL/min — ABNORMAL LOW (ref >60)
Glucose, Bld: 206 mg/dL — ABNORMAL HIGH (ref 70–99)
Phosphorus: 2.7 mg/dL (ref 2.5–4.6)
Potassium: 4.3 mmol/L (ref 3.5–5.1)
Sodium: 134 mmol/L — ABNORMAL LOW (ref 135–145)

## 2019-09-11 LAB — COOXEMETRY PANEL
Carboxyhemoglobin: 1.7 % — ABNORMAL HIGH (ref 0.5–1.5)
Methemoglobin: 1.1 % (ref 0.0–1.5)
O2 Saturation: 67.8 %
Total hemoglobin: 10 g/dL — ABNORMAL LOW (ref 12.0–16.0)

## 2019-09-11 MED ORDER — MIDODRINE HCL 5 MG PO TABS: 15.0000 mg | ORAL_TABLET | Freq: Three times a day (TID) | ORAL | 6 refills | Status: DC

## 2019-09-11 MED ORDER — SEVELAMER CARBONATE 800 MG PO TABS: 2400.0000 mg | ORAL_TABLET | Freq: Three times a day (TID) | ORAL | 6 refills | Status: DC

## 2019-09-11 MED ORDER — MIDODRINE HCL 10 MG PO TABS: 15.0000 mg | ORAL_TABLET | Freq: Three times a day (TID) | ORAL | 6 refills | Status: AC

## 2019-09-11 MED ORDER — PREDNISONE 20 MG PO TABS: 80.0000 mg | ORAL_TABLET | Freq: Every day | ORAL | 6 refills | Status: DC

## 2019-09-11 MED ORDER — FA-PYRIDOXINE-CYANOCOBALAMIN 2.5-25-2 MG PO TABS: 1.0000 | ORAL_TABLET | Freq: Every day | ORAL | 6 refills | Status: DC

## 2019-09-11 MED ORDER — FOLTANX 3-35-2 MG PO TABS: 1.0000 | ORAL_TABLET | Freq: Every day | ORAL | 6 refills | Status: AC

## 2019-09-11 MED ORDER — AMIODARONE HCL 200 MG PO TABS: 200.0000 mg | ORAL_TABLET | Freq: Every day | ORAL | 6 refills | Status: AC

## 2019-09-11 MED ORDER — RENA-VITE PO TABS: 1.0000 | ORAL_TABLET | Freq: Every day | ORAL | 6 refills | Status: AC

## 2019-09-11 MED ORDER — SEVELAMER CARBONATE 800 MG PO TABS: 2400.0000 mg | ORAL_TABLET | Freq: Three times a day (TID) | ORAL | 6 refills | Status: AC

## 2019-09-11 MED FILL — predniSONE 20 MG TABS: 20 | 30 days supply | Qty: 120 | Fill #0

## 2019-09-11 MED FILL — SEVELAMER CARBONATE 800MG: 800 | 20 days supply | Qty: 180 | Fill #0

## 2019-09-11 MED FILL — MIDODRINE HCL 10 MG TABLET: 10 | 30 days supply | Qty: 135 | Fill #0

## 2019-09-11 MED FILL — L-METHYL-B6-B12 TABLET: 3-35-2 | 30 days supply | Qty: 30 | Fill #0

## 2019-09-11 MED FILL — AMIODARONE HCL 200 MG TAB: 200 | 30 days supply | Qty: 30 | Fill #0

## 2019-09-11 MED FILL — RENA-VITE TABLET: 30 days supply | Qty: 30 | Fill #0

## 2019-09-11 NOTE — Telephone Encounter (Signed)
Phone call placed to patient to offer to schedule a visit with [facility]. Phone rang, with no answer I left a voicemail for call back. 

## 2019-09-11 NOTE — Progress Notes (Signed)
CARDIAC REHAB PHASE I   Pt and family deny questions or concerns on discharge. Encouraged continued ambulation as able with emphasis on safety. Pt getting dressed, denies needing help. Son at bedside.  [phone] Rufina Falco, RN BSN 09/11/2019 10:40 AM

## 2019-09-11 NOTE — Progress Notes (Signed)
Patient ID: Ricardo Payne, male   DOB: 01-30-1931, 86 y.o.   MRN: [id number] [name] Progress Note   Assessment/ Plan:   1.  End-stage renal disease following AKI on CKD 4/chronic CRS: After failure to respond to diuretic therapy and persistent volume overload that was compromising his respiratory status; started on hemodialysis that has improved his symptoms significantly although CVP remains elevated.  Plans noted to resume outpatient dialysis tomorrow following discharge today; will likely need longer time versus dialysis 4 days a week to facilitate ultrafiltration.  He was evaluated earlier this hospitalization with vascular surgery and the perioperative risk of access placement is felt to be prohibitively high at this time. 2.  Acute exacerbation of congestive heart failure: EF 20% with mild to moderate MR, severe TR and mild AS.  Did not respond to inotropic support and diuretics and no changes to treatment response following cardioversion.  Continue hemodialysis for volume management. 3.  Anemia: Hemoglobin and hematocrit currently at goal: No overt losses.  Monitor for ESA. 4.  History of atrial fibrillation status post cardioversion: Currently on amiodarone and Eliquis on hold due to low platelets from ITP. 5.  Thrombocytopenia: Suspected to be ITP per oncology/hematology.  On prednisone; will need outpatient follow-up with hematology for adjustment of this.  Subjective:   Feeling somewhat tired this morning.  Inquires about removal of PICC line.   Objective:   BP (!) 119/57   Pulse 71   Temp 97.7 F (36.5 C) (Oral)   Resp (!) 21   Ht 5\' 6"  (1.676 m)   Wt 97.5 kg   SpO2 99%   BMI 34.69 kg/m   Intake/Output Summary (Last 24 hours) at 09/11/2019 0725 Last data filed at 09/10/2019 2230 Gross per 24 hour  Intake 10 ml  Output 3501 ml  Net -3491 ml   Weight change: 0.881 kg  Physical Exam: Gen: Resting comfortably in bed CVS: Pulse regular rhythm, normal  rate, S1 and S2 normal Resp: Diminished breath sounds over bases, no rhonchi.  Right IJ TDC Abd: Soft, obese, nontender Ext: No lower extremity edema  Imaging: No results found.  Labs: BMET Recent Labs  Lab 09/05/19 0356 09/06/19 0420 09/07/19 0349 09/08/19 0518 09/09/19 0500 09/10/19 0500 09/11/19 0510  NA 136 135 134* 138 135 135 134*  K 4.4 4.5 4.6 4.1 4.3 4.2 4.3  CL 99 99 97* 99 97* 96* 97*  CO2 28 26 24 28 27 26 26   GLUCOSE 145* 160* 154* 154* 177* 166* 206*  BUN 22 25* 42* 27* 37* 52* 32*  CREATININE 2.71* 2.79* 3.68* 2.97* 3.87* 4.92* 3.92*  CALCIUM 8.5* 8.6* 9.0 8.7* 9.0 9.0 8.6*  PHOS 3.7 3.6 3.9 3.7 3.2 3.3 2.7   CBC Recent Labs  Lab 09/08/19 0518 09/09/19 0500 09/10/19 0500 09/11/19 0510  WBC 8.0 8.2 8.4 8.0  HGB 9.2* 9.7* 9.6* 9.8*  HCT 29.8* 31.6* 31.3* 31.8*  MCV 99.3 98.8 98.1 99.7  PLT 28* 35* 49* 46*   Medications:    . sodium chloride   Intravenous Once  . allopurinol  50 mg Oral QODAY  . amiodarone  200 mg Oral Daily  . Chlorhexidine Gluconate Cloth  6 each Topical Q0600  . Chlorhexidine Gluconate Cloth  6 each Topical Q0600  . darbepoetin (ARANESP) injection - DIALYSIS  60 mcg Intravenous Q Mon-HD  . docusate sodium  200 mg Oral Daily  . folic acid-pyridoxine-cyancobalamin  1 tablet Oral Daily  . melatonin  6 mg  Oral QHS  . midodrine  15 mg Oral TID with meals  . multivitamin  1 tablet Oral QHS  . polyethylene glycol  17 g Oral Daily  . pramipexole  0.25 mg Oral BID  . predniSONE  80 mg Oral Q breakfast  . rosuvastatin  10 mg Oral q1800  . sevelamer carbonate  2,400 mg Oral TID WC  . sodium chloride flush  10-40 mL Intracatheter Q12H  . sodium chloride flush  3 mL Intravenous Q12H  . tamsulosin  0.4 mg Oral Daily   Elmarie Shiley, MD 09/11/2019, 7:25 AM

## 2019-09-11 NOTE — Progress Notes (Signed)
Patient discharging today. PMT provider f/u with patient and son (Dwayne) at bedside. Answered questions regarding discharge plan. Outpatient palliative referral placed with [facility]. MOST form and durable DNR completed this admission and will go home with patient. First outpatient HD session scheduled for tomorrow afternoon. Patient is glad to be discharging today.   NO CHARGE  Ihor Dow, [place], FNP-C Palliative Medicine Team  Phone: [phone] Fax: [phone]

## 2019-09-11 NOTE — Care Management Important Message (Signed)
Important Message  Patient Details  Name: HAYES CZAJA MRN: [id number] Date of Birth: 1931/02/08   Medicare Important Message Given:  Yes     Shelda Altes 09/11/2019, 10:01 AM

## 2019-09-11 NOTE — Progress Notes (Addendum)
Patient ID: Ricardo Payne, male   DOB: 1930-09-18, 86 y.o.   MRN: [id number]     Advanced Heart Failure Rounding Note  PCP-Cardiologist: Donato Heinz, MD   Subjective:    Platelets 31>21>17>18> transfused 1 unit plts > 40 > 28 > 35>49>46.  Prednisone ongoing.  No overt bleeding.   Wants to go home. Denies SOB   Objective:   Weight Range: 97.5 kg Body mass index is 34.69 kg/m.   Vital Signs:   Temp:  [97.7 F (36.5 C)-98.6 F (37 C)] 97.7 F (36.5 C) (04/13 0318) Pulse Rate:  [67-80] 71 (04/13 0319) Resp:  [14-22] 21 (04/12 2111) BP: (95-123)/(49-68) 119/57 (04/13 0318) SpO2:  [96 %-100 %] 99 % (04/13 0319) Weight:  [96.9 kg-97.5 kg] 97.5 kg (04/13 0319) Last BM Date: 09/08/19  Weight change: Filed Weights   09/10/19 0710 09/10/19 1043 09/11/19 0319  Weight: 100.4 kg 96.9 kg 97.5 kg    Intake/Output:   Intake/Output Summary (Last 24 hours) at 09/11/2019 0918 Last data filed at 09/11/2019 3614 Gross per 24 hour  Intake 250 ml  Output 3501 ml  Net -3251 ml      Physical Exam   General:  Well appearing. No resp difficulty HEENT: normal Neck: supple. JVP to jaw . Carotids 2+ bilat; no bruits. No lymphadenopathy or thryomegaly appreciated. Cor: PMI nondisplaced. Regular rate & rhythm. No rubs, gallops or murmurs. R subclavian HD catheter.  Lungs: clear Abdomen: soft, nontender, nondistended. No hepatosplenomegaly. No bruits or masses. Good bowel sounds. Extremities: no cyanosis, clubbing, rash, edema/ RUE PICC Neuro: alert & orientedx3, cranial nerves grossly intact. moves all 4 extremities w/o difficulty. Affect pleasant      Telemetry   NSR 70-80s    Labs    CBC Recent Labs    09/10/19 0500 09/11/19 0510  WBC 8.4 8.0  HGB 9.6* 9.8*  HCT 31.3* 31.8*  MCV 98.1 99.7  PLT 49* 46*   Basic Metabolic Panel Recent Labs    09/10/19 0500 09/11/19 0510  NA 135 134*  K 4.2 4.3  CL 96* 97*  CO2 26 26  GLUCOSE 166* 206*  BUN 52* 32*    CREATININE 4.92* 3.92*  CALCIUM 9.0 8.6*  PHOS 3.3 2.7   Liver Function Tests Recent Labs    09/10/19 0500 09/11/19 0510  ALBUMIN 3.4* 3.4*   No results for input(s): LIPASE, AMYLASE in the last 72 hours. Cardiac Enzymes No results for input(s): CKTOTAL, CKMB, CKMBINDEX, TROPONINI in the last 72 hours.  BNP: BNP (last 3 results) Recent Labs    08/13/19 1437 08/16/19 1659 08/17/19 0714  BNP 1,224.9* 1,081.1* 1,056.0*    ProBNP (last 3 results) Recent Labs    05/21/19 1535  PROBNP 6,191*     D-Dimer No results for input(s): DDIMER in the last 72 hours. Hemoglobin A1C No results for input(s): HGBA1C in the last 72 hours. Fasting Lipid Panel No results for input(s): CHOL, HDL, LDLCALC, TRIG, CHOLHDL, LDLDIRECT in the last 72 hours. Thyroid Function Tests No results for input(s): TSH, T4TOTAL, T3FREE, THYROIDAB in the last 72 hours.  Invalid input(s): FREET3  Other results:   Imaging    No results found.   Medications:     Scheduled Medications: . sodium chloride   Intravenous Once  . allopurinol  50 mg Oral QODAY  . amiodarone  200 mg Oral Daily  . Chlorhexidine Gluconate Cloth  6 each Topical Q0600  . Chlorhexidine Gluconate Cloth  6 each Topical Q0600  .  darbepoetin (ARANESP) injection - DIALYSIS  60 mcg Intravenous Q Mon-HD  . docusate sodium  200 mg Oral Daily  . folic acid-pyridoxine-cyancobalamin  1 tablet Oral Daily  . melatonin  6 mg Oral QHS  . midodrine  15 mg Oral TID with meals  . multivitamin  1 tablet Oral QHS  . polyethylene glycol  17 g Oral Daily  . pramipexole  0.25 mg Oral BID  . predniSONE  80 mg Oral Q breakfast  . rosuvastatin  10 mg Oral q1800  . sevelamer carbonate  2,400 mg Oral TID WC  . sodium chloride flush  10-40 mL Intracatheter Q12H  . sodium chloride flush  3 mL Intravenous Q12H  . tamsulosin  0.4 mg Oral Daily    Infusions: . sodium chloride Stopped (08/19/19 1323)  . ferric gluconate (FERRLECIT/NULECIT) IV  125 mg (09/10/19 1105)    PRN Medications: sodium chloride, acetaminophen, HYDROmorphone (DILAUDID) injection, sodium chloride flush, sodium chloride flush   Assessment/Plan   1. Acute on chronic systolic CHF: Ischemic cardiomyopathy.  St Jude ICD.  Echo in 2/21 with EF <20%, mildly dilated RV with moderately decreased systolic function, mild AS, mild-moderate MR, severe TR.  CHF is complicated by cardiorenal syndrome and recurrent atrial fibrillation with RVR.  Suspect low output HF.  He was on milrinone but now off.  Maintaining NSR. He has started HD for volume management.  CVP still very high at 30 (no HD x 2 days) but symptomatically, he is actually doing quite well.  Suspect that CVP is going to remain at least moderately high with severe TR and RV failure.  CVP 19.   -Continue midodrine to maintain BP.  - Continue to dialyze as aggressively as possible to get fluid off, will need 4 days/week HD.  2. CAD: S/p remote CABG (1989).  He does not think he has had a coronary evaluation since that time.  HS-TnI not elevated.  -No chest pain. Continue Crestor.  - No ASA given anticoagulation.  3. Atrial fibrillation: Paroxysmal.  He was admitted with atrial fibrillation with RVR.  This may have triggered his CHF exacerbation (though based on device interrogation, worsening volume status does not seem to necessarily track with atrial fibrillation) or may be a consequence of the exacerbation.  TEE-guided DCCV done 3/24, remains in NSR.  - Continue amio 200 mg daily.   - Hold Eliquis with very low plts (see below).   4. AKI on CKD stage 4-> ESRD. BP remaining stable at HD with midodrine use.  He has tolerated HD well so far.   - Will ideally get HD 4 days/week.  - Home today  5. PAD: Patient reports peripheral intervention though I do not see this is in history.  6. CVA: In past. Likely related to atrial fibrillation.  - Restart Eliquis when plts improved.  7. Thrombocytopenia: Plts on admit 75  K. History of ITP, follows with hematology as outpatient.  HIT negative. He has had 1 unit plt transfusion.  Today, plts improving at 46 . Hematology has seen in consult, prednisone started. He has outpatient follow up with Hematology.   - Continue prednisone per hematology.  - Transfuse plts < 10. -Consider eliquis tomorrow.    8. Deconditioning: Doing well on hall walks.  - PT recommending HHPT. HH ordered.   Will need to restart eliquis when ok with hematology. Stay off for now. He has follow up with hematology.   Patient has a dialysis schedule Monday Wednesday Friday [facility]  kidney center. I personally called Renal Navigator about discharge.   Palliative Care will follow in the community. [facility] will follow for [facility] and HHPT.   Length of Stay: [place], NP  09/11/2019, 9:18 AM  Patient seen with NP, agree with the above note.   CVP 19 today, this is significantly lower after last HD.  He feels good, wants to go home.   Platelets 46K today, he remains on prednisone per hematology.  - Prednisone taper per hematology instructions.  - Needs to restart Eliquis for atrial fibrillation when plts reach 50K per hematology, will discuss with them going ahead and restarting.    He can go home today, has HD seat.  Will arrange followup.   Loralie Champagne 09/11/2019 10:25 AM

## 2019-09-11 NOTE — Discharge Summary (Signed)
Advanced Heart Failure Team  Discharge Summary   Patient ID: Ricardo Payne MRN: [id number], DOB/AGE: 84/25/1932 84 y.o. Admit date: 08/16/2019 D/C date:     09/11/2019   Primary Discharge Diagnoses:  1. A/C Systolic Heart Failure 2 CAD 3. PAF 4. AKI on CKD Stage IV-> ESRD 5. PAD 6. CVA 7. Thrombocytopenia  8. Deconditioning   Hospital Course:   Ricardo Payne is a 86 y.o. male with a PMH of Chronic combined CHF (EF 20%) felt to be 2/2 ischemic cardiomyopathy, CAD s/p CABG, s/p ICD for primary prevention, paroxysmal atrial fibrillation on eliquis, HTN, HLD, CKD stage 4, with multiple admissions for CHF exacerbations.   Admitted with marked volume overload and A fib RVR.  Diuresed with IV lasix with minimal results. Loaded on amiodarone and underwent successful cardioversion. Nephrology consulted. Had iHD cath placed and was able to tolerate  Placed iHD and was able to tolerate iHD. Vascular evaluated for possible access however due multiple co-morbidities permanent access was not placed.   Developed thrombocytopenia so hematology consulted. Heparin products stopped and he was placed on eliquis. Platelets continued to drop so eliquis was stopped and he was placed on prednisone. Continue prednisone per Hematology. He will follow up with Hematology as an outpatient.   Renal Navigator assisted with dialysis seat and met with him on the day of discharge. See below for detailed problem list. He be followed in the HF clinic. We plan to get him back on eliquis once platelets are > 50,000.   Patient has a dialysis schedule Monday Wednesday Friday [facility] kidney center.   In the community he will be followed by Palliative Care and Home Health.   1. Acute on chronic systolic CHF: Ischemic cardiomyopathy. St Jude ICD. Echo in 2/21 with EF <20%, mildly dilated RV with moderately decreased systolic function, mild AS, mild-moderate MR, severe TR. CHF is complicated by  cardiorenal syndrome and recurrent atrial fibrillation with RVR. Suspect low output HF. He was on milrinone but now off.  Maintaining NSR. He has started HD for volume management.  Suspect that CVP is going to remain at least moderately high with severe TR and RV failure.  CVP 19 on the day of discharge. No bb or arb with hypotension.  -Continue midodrine to maintain BP.  - Continue to dialyze as aggressively as possible to get fluid off, will need 4 days/week HD.  2. CAD: S/p remote CABG (1989). He does not think he has had a coronary evaluation since that time. HS-TnI not elevated.  -No chest pain. Continue Crestor.  - No ASA given anticoagulation.  3. Atrial fibrillation: Paroxysmal. He was admitted with atrial fibrillation with RVR. This may have triggered his CHF exacerbation (though based on device interrogation, worsening volume status does not seem to necessarily track with atrial fibrillation) or may be a consequence of the exacerbation. TEE-guided DCCV done 3/24, remains in NSR.  - Continue amio 200 mg daily.   - Off eliquis with very low plts (see below).   4. AKI on CKD stage 4-> ESRD. BP remaining stable at HD with midodrine use.  He has tolerated HD well so far.   - Will ideally get HD 4 days/week.  5. PAD: Patient reports peripheral intervention though I do not see this is in history.  6. CVA: In past. Likely related to atrial fibrillation. - Restart Eliquis when plts improved.  7. Thrombocytopenia: Plts on admit 75 K. History of ITP, follows with hematology as outpatient.  HIT negative. He has had 1 unit plt transfusion.  Today, plts improving at 46 . Hematology has seen in consult, prednisone started. He has outpatient follow up with Hematology.   - Continue prednisone per hematology.  - Transfuse plts < 10.  8. Deconditioning: Doing well on hall walks.  - PT recommending HHPT. HH ordered.   Discharge Vitals: Blood pressure (!) 119/57, pulse 71, temperature 97.7 F (36.5  C), temperature source Oral, resp. rate (!) 21, height _0  (1.676 m), weight 97.5 kg, SpO2 99 %.  Labs: Lab Results  Component Value Date   WBC 8.0 09/11/2019   HGB 9.8 (L) 09/11/2019   HCT 31.8 (L) 09/11/2019   MCV 99.7 09/11/2019   PLT 46 (L) 09/11/2019    Recent Labs  Lab 09/11/19 0510  NA 134*  K 4.3  CL 97*  CO2 26  BUN 32*  CREATININE 3.92*  CALCIUM 8.6*  GLUCOSE 206*   Lab Results  Component Value Date   CHOL 144 03/26/2019   HDL 71 03/26/2019   LDLCALC 59 03/26/2019   TRIG 70 03/26/2019   BNP (last 3 results) Recent Labs    08/13/19 1437 08/16/19 1659 08/17/19 0714  BNP 1,224.9* 1,081.1* 1,056.0*    ProBNP (last 3 results) Recent Labs    05/21/19 1535  PROBNP 6,191*     Diagnostic Studies/Procedures   No results found.  Discharge Medications   Allergies as of 09/11/2019   No Known Allergies     Medication List    STOP taking these medications   Eliquis 2.5 MG Tabs tablet Generic drug: apixaban   ergocalciferol 1.25 MG (50000 UT) capsule Commonly known as: VITAMIN D2   Folic Acid-Vit U9-NAT F57 2.5-25-1 MG Tabs tablet Commonly known as: FOLBEE   torsemide 20 MG tablet Commonly known as: DEMADEX     TAKE these medications   allopurinol 100 MG tablet Commonly known as: ZYLOPRIM Take 0.5 tablets (50 mg total) by mouth every other day.   amiodarone 200 MG tablet Commonly known as: PACERONE Take 1 tablet (200 mg total) by mouth daily.   folic acid-pyridoxine-cyancobalamin 2.5-25-2 MG Tabs tablet Commonly known as: FOLTX Take 1 tablet by mouth daily.   midodrine 5 MG tablet Commonly known as: PROAMATINE Take 3 tablets (15 mg total) by mouth with breakfast, with lunch, and with evening meal. What changed:   medication strength  how much to take  when to take this   multivitamin Tabs tablet Take 1 tablet by mouth at bedtime.   pramipexole 0.25 MG tablet Commonly known as: MIRAPEX Take 0.25 mg by mouth 2 (two) times  daily.   predniSONE 20 MG tablet Commonly known as: DELTASONE Take 4 tablets (80 mg total) by mouth daily with breakfast.   rosuvastatin 10 MG tablet Commonly known as: CRESTOR Take 1 tablet (10 mg total) by mouth daily.   sevelamer carbonate 800 MG tablet Commonly known as: RENVELA Take 3 tablets (2,400 mg total) by mouth 3 (three) times daily with meals.   tamsulosin 0.4 MG Caps capsule Commonly known as: FLOMAX Take 1 capsule (0.4 mg total) by mouth daily.            Durable Medical Equipment  (From admission, onward)         Start     Ordered   08/29/19 1216  Heart failure home health orders  (Heart failure home health orders / Face to face)  Once    Comments: Heart Failure Follow-up Care:  Verify follow-up appointments per Patient Discharge Instructions. Confirm transportation arranged. Reconcile home medications with discharge medication list. Remove discontinued medications from use. Assist patient/caregiver to manage medications using pill box. Reinforce low sodium food selection Assessments: Vital signs and oxygen saturation at each visit. Assess home environment for safety concerns, caregiver support and availability of low-sodium foods. Consult Education officer, museum, PT/OT, Dietitian, and CNA based on assessments. Perform comprehensive cardiopulmonary assessment. Notify MD for any change in condition or weight gain of 3 pounds in one day or 5 pounds in one week with symptoms. Daily Weights and Symptom Monitoring: Ensure patient has access to scales. Teach patient/caregiver to weigh daily before breakfast and after voiding using same scale and record.    Teach patient/caregiver to track weight and symptoms and when to notify Provider. Activity: Develop individualized activity plan with patient/caregiver.   Question Answer Comment  Heart Failure Follow-up Care Advanced Heart Failure (AHF) Clinic at [phone]   Lab frequency Other see comments   Fax lab results to  AHF Clinic at [phone]   Diet Low Sodium Heart Healthy   Fluid restrictions: 2000 mL Fluid      08/29/19 1215          Disposition   The patient will be discharged in stable condition to home. Discharge Instructions    Diet - low sodium heart healthy   Complete by: As directed    Heart Failure patients record your daily weight using the same scale at the same time of day   Complete by: As directed    Increase activity slowly   Complete by: As directed      Follow-up Information    [facility] Follow up.   Why: HHRN,HHPT Contact information: [place] [address] [phone]        [facility] Follow up on 09/10/2019.   Specialty: Cardiology Contact information: [address] [id number] [place] [phone]            Duration of Discharge Encounter: Greater than 35 minutes   Signed, Darrick Grinder NP-C  09/11/2019, 9:33 AM

## 2019-09-11 NOTE — TOC Transition Note (Signed)
Transition of Care [facility]) - CM/SW Discharge Note   Patient Details  Name: Ricardo Payne MRN: [id number] Date of Birth: 01/13/1931  [facility]) CM/SW Contact:  Alberteen Sam, LCSW Phone Number: 09/11/2019, 9:52 AM   Clinical Narrative:     CSW spoke with Audrea Muscat with [facility], informed of patient's discharge today as they will follow for outpatient palliative services.   CSW spoke with Malachy Mood with [facility] to inform of discharge today for home health RN and PT services. No DME needs identified.   RN to call patient's son to pick up once ready for discharge.   Final next level of care: [place] Barriers to Discharge: Continued Medical Work up   Patient Goals and CMS Choice Patient states their goals for this hospitalization and ongoing recovery are:: home with home health services CMS Medicare.gov Compare Post Acute Care list provided to:: Patient Represenative (must comment)(son Dwayne) Choice offered to / list presented to : Adult Children  Discharge Placement                       Discharge Plan and Services In-house Referral: Clinical Social Work   Post Acute Care Choice: Home Health                    HH Arranged: PT, RN [place] [facility] Agency: [place] Date [place]: 09/11/19 Time [place]: [phone] Representative spoke with at [place]: [place] Determinants of Health ([place]) Interventions     Readmission Risk Interventions No flowsheet data found.

## 2019-09-18 ENCOUNTER — Other Ambulatory Visit: Payer: Self-pay

## 2019-09-18 ENCOUNTER — Encounter: Payer: Self-pay | Admitting: Internal Medicine

## 2019-09-18 ENCOUNTER — Inpatient Hospital Stay: Payer: Medicare Other | Attending: Physician Assistant | Admitting: Internal Medicine

## 2019-09-18 ENCOUNTER — Inpatient Hospital Stay: Payer: Medicare Other

## 2019-09-18 VITALS — BP 110/51 | HR 80 | Temp 98.3°F | Resp 18 | Ht 66.0 in | Wt 216.1 lb

## 2019-09-18 DIAGNOSIS — D693 Immune thrombocytopenic purpura: Secondary | ICD-10-CM | POA: Diagnosis not present

## 2019-09-18 DIAGNOSIS — D509 Iron deficiency anemia, unspecified: Secondary | ICD-10-CM | POA: Insufficient documentation

## 2019-09-18 DIAGNOSIS — D696 Thrombocytopenia, unspecified: Secondary | ICD-10-CM | POA: Diagnosis not present

## 2019-09-18 DIAGNOSIS — D500 Iron deficiency anemia secondary to blood loss (chronic): Secondary | ICD-10-CM | POA: Diagnosis not present

## 2019-09-18 DIAGNOSIS — Z79899 Other long term (current) drug therapy: Secondary | ICD-10-CM | POA: Insufficient documentation

## 2019-09-18 DIAGNOSIS — Z7952 Long term (current) use of systemic steroids: Secondary | ICD-10-CM | POA: Diagnosis not present

## 2019-09-18 DIAGNOSIS — I083 Combined rheumatic disorders of mitral, aortic and tricuspid valves: Secondary | ICD-10-CM | POA: Diagnosis not present

## 2019-09-18 DIAGNOSIS — Z8673 Personal history of transient ischemic attack (TIA), and cerebral infarction without residual deficits: Secondary | ICD-10-CM | POA: Insufficient documentation

## 2019-09-18 DIAGNOSIS — N186 End stage renal disease: Secondary | ICD-10-CM | POA: Diagnosis not present

## 2019-09-18 DIAGNOSIS — R0602 Shortness of breath: Secondary | ICD-10-CM | POA: Diagnosis not present

## 2019-09-18 DIAGNOSIS — I2510 Atherosclerotic heart disease of native coronary artery without angina pectoris: Secondary | ICD-10-CM

## 2019-09-18 DIAGNOSIS — I5042 Chronic combined systolic (congestive) and diastolic (congestive) heart failure: Secondary | ICD-10-CM | POA: Diagnosis not present

## 2019-09-18 DIAGNOSIS — I132 Hypertensive heart and chronic kidney disease with heart failure and with stage 5 chronic kidney disease, or end stage renal disease: Secondary | ICD-10-CM | POA: Diagnosis not present

## 2019-09-18 LAB — CBC WITH DIFFERENTIAL (CANCER CENTER ONLY)
Abs Immature Granulocytes: 0.46 10*3/uL — ABNORMAL HIGH (ref 0.00–0.07)
Basophils Absolute: 0 10*3/uL (ref 0.0–0.1)
Basophils Relative: 0 %
Eosinophils Absolute: 0.1 10*3/uL (ref 0.0–0.5)
Eosinophils Relative: 0 %
HCT: 34.6 % — ABNORMAL LOW (ref 39.0–52.0)
Hemoglobin: 11 g/dL — ABNORMAL LOW (ref 13.0–17.0)
Immature Granulocytes: 3 %
Lymphocytes Relative: 2 %
Lymphs Abs: 0.3 10*3/uL — ABNORMAL LOW (ref 0.7–4.0)
MCH: 30.5 pg (ref 26.0–34.0)
MCHC: 31.8 g/dL (ref 30.0–36.0)
MCV: 95.8 fL (ref 80.0–100.0)
Monocytes Absolute: 0.7 10*3/uL (ref 0.1–1.0)
Monocytes Relative: 4 %
Neutro Abs: 13.2 10*3/uL — ABNORMAL HIGH (ref 1.7–7.7)
Neutrophils Relative %: 91 %
Platelet Count: 62 10*3/uL — ABNORMAL LOW (ref 150–400)
RBC: 3.61 MIL/uL — ABNORMAL LOW (ref 4.22–5.81)
RDW: 17.7 % — ABNORMAL HIGH (ref 11.5–15.5)
WBC Count: 14.8 10*3/uL — ABNORMAL HIGH (ref 4.0–10.5)
nRBC: 0 % (ref 0.0–0.2)

## 2019-09-18 LAB — CMP (CANCER CENTER ONLY)
ALT: 72 U/L — ABNORMAL HIGH (ref 0–44)
AST: 47 U/L — ABNORMAL HIGH (ref 15–41)
Albumin: 3.2 g/dL — ABNORMAL LOW (ref 3.5–5.0)
Alkaline Phosphatase: 197 U/L — ABNORMAL HIGH (ref 38–126)
Anion gap: 11 (ref 5–15)
BUN: 44 mg/dL — ABNORMAL HIGH (ref 8–23)
CO2: 30 mmol/L (ref 22–32)
Calcium: 7.9 mg/dL — ABNORMAL LOW (ref 8.9–10.3)
Chloride: 96 mmol/L — ABNORMAL LOW (ref 98–111)
Creatinine: 3.53 mg/dL (ref 0.61–1.24)
GFR, Est AFR Am: 17 mL/min — ABNORMAL LOW (ref >60)
GFR, Estimated: 15 mL/min — ABNORMAL LOW (ref >60)
Glucose, Bld: 119 mg/dL — ABNORMAL HIGH (ref 70–99)
Potassium: 3.7 mmol/L (ref 3.5–5.1)
Sodium: 137 mmol/L (ref 135–145)
Total Bilirubin: 1.7 mg/dL — ABNORMAL HIGH (ref 0.3–1.2)
Total Protein: 6.2 g/dL — ABNORMAL LOW (ref 6.5–8.1)

## 2019-09-18 NOTE — Progress Notes (Signed)
[facility] [phone]   [phone]  OFFICE PROGRESS NOTE  [name], [place] [address]  DIAGNOSIS: Iron deficiency anemia and thrombocytopenia  PRIOR THERAPY: none  CURRENT THERAPY: OTC iron supplements p.o. daily.   INTERVAL HISTORY: Ricardo Payne 86 y.o. male returns to the clinic today for follow-up visit accompanied by his son.  The patient is feeling much better today.  He was recently admitted to [facility] with congestive heart failure in addition to acute kidney injury and he was on hemodialysis.  He was also found to have significant thrombocytopenia and anemia during his admission.  The patient was started on a tapered dose of prednisone and he is currently on 80 mg p.o. daily.  He is tolerating it well except for the insomnia.  He denied having any current chest pain, shortness of breath, cough or hemoptysis.  He denied having any fever or chills.  He has no nausea, vomiting, diarrhea or constipation.  He has no bleeding issues.  He is here today for evaluation and repeat blood work.  MEDICAL HISTORY: Past Medical History:  Diagnosis Date  . Acquired thrombophilia ([place])   . Age-related cataract of both eyes   . ASHD (arteriosclerotic heart disease)   . Benign prostatic hyperplasia   . CAD (coronary artery disease)    a. s/p CABG x1 in 1990  . Cerebellar stroke ([place]) 07/02/2016  . Chronic combined systolic and diastolic CHF (congestive heart failure) ([place])   . Chronic gout without tophus   . CKD (chronic kidney disease), stage IV ([place])   . CVA (cerebral vascular accident) ([place]) 2018  . Essential hypertension   . ICD (implantable cardioverter-defibrillator) in place   . Ischemic cardiomyopathy   . Mixed hyperlipidemia   . PAF (paroxysmal atrial fibrillation) ([place])   . Restless legs   . Secondary pulmonary arterial hypertension ([place])   . Thrombocytopenia ([place])   . Vitamin D  deficiency     ALLERGIES:  has No Known Allergies.  MEDICATIONS:  Current Outpatient Medications  Medication Sig Dispense Refill  . allopurinol (ZYLOPRIM) 100 MG tablet Take 0.5 tablets (50 mg total) by mouth every other day. 15 tablet 0  . amiodarone (PACERONE) 200 MG tablet Take 1 tablet (200 mg total) by mouth daily. 30 tablet 6  . L-Methylfolate-B6-B12 (FOLTANX) 3-35-2 MG TABS Take 1 tablet by mouth daily. 30 tablet 6  . midodrine (PROAMATINE) 10 MG tablet Take 1.5 tablets (15 mg total) by mouth with breakfast, with lunch, and with evening meal. 135 tablet 6  . multivitamin (RENA-VIT) TABS tablet Take 1 tablet by mouth at bedtime. 30 tablet 6  . pramipexole (MIRAPEX) 0.25 MG tablet Take 0.25 mg by mouth 2 (two) times daily.    . predniSONE (DELTASONE) 20 MG tablet Take 4 tablets (80 mg total) by mouth daily with breakfast. 120 tablet 6  . rosuvastatin (CRESTOR) 10 MG tablet Take 1 tablet (10 mg total) by mouth daily. 90 tablet 3  . sevelamer carbonate (RENVELA) 800 MG tablet Take 3 tablets (2,400 mg total) by mouth 3 (three) times daily with meals. 270 tablet 6  . tamsulosin (FLOMAX) 0.4 MG CAPS capsule Take 1 capsule (0.4 mg total) by mouth daily. 30 capsule 0   No current facility-administered medications for this visit.    SURGICAL HISTORY:  Past Surgical History:  Procedure Laterality Date  . CARDIOVERSION N/A 08/22/2019   Procedure: CARDIOVERSION;  Surgeon:  Larey Dresser, MD;  Location: [facility] ENDOSCOPY;  Service: Cardiovascular;  Laterality: N/A;  . CHOLECYSTECTOMY    . CORONARY ARTERY BYPASS GRAFT    . IR FLUORO GUIDE CV LINE RIGHT  08/23/2019  . IR US GUIDE VASC ACCESS RIGHT  08/23/2019  . PACEMAKER IMPLANT  10/2018  . TEE WITHOUT CARDIOVERSION N/A 08/22/2019   Procedure: TRANSESOPHAGEAL ECHOCARDIOGRAM (TEE);  Surgeon: Larey Dresser, MD;  Location: [facility] ENDOSCOPY;  Service: Cardiovascular;  Laterality: N/A;    REVIEW OF SYSTEMS:  A comprehensive review of systems was  negative except for: Constitutional: positive for fatigue   PHYSICAL EXAMINATION: General appearance: alert, cooperative, fatigued and no distress Head: Normocephalic, without obvious abnormality, atraumatic Neck: no adenopathy, no JVD, supple, symmetrical, trachea midline and thyroid not enlarged, symmetric, no tenderness/mass/nodules Lymph nodes: Cervical, supraclavicular, and axillary nodes normal. Resp: clear to auscultation bilaterally Back: symmetric, no curvature. ROM normal. No CVA tenderness. Cardio: regular rate and rhythm, S1, S2 normal, no murmur, click, rub or gallop GI: soft, non-tender; bowel sounds normal; no masses,  no organomegaly Extremities: extremities normal, atraumatic, no cyanosis or edema  ECOG PERFORMANCE STATUS: 1 - Symptomatic but completely ambulatory  Blood pressure (!) 110/51, pulse 80, temperature 98.3 F (36.8 C), temperature source Temporal, resp. rate 18, height 5\' 6"  (1.676 m), weight 216 lb 1.6 oz (98 kg), SpO2 97 %.  LABORATORY DATA: Lab Results  Component Value Date   WBC 14.8 (H) 09/18/2019   HGB 11.0 (L) 09/18/2019   HCT 34.6 (L) 09/18/2019   MCV 95.8 09/18/2019   PLT 62 (L) 09/18/2019      Chemistry      Component Value Date/Time   NA 134 (L) 09/11/2019 0510   NA 143 08/13/2019 1437   K 4.3 09/11/2019 0510   CL 97 (L) 09/11/2019 0510   CO2 26 09/11/2019 0510   BUN 32 (H) 09/11/2019 0510   BUN 52 (H) 08/13/2019 1437   CREATININE 3.92 (H) 09/11/2019 0510   CREATININE 1.84 (H) 06/11/2019 1323      Component Value Date/Time   CALCIUM 8.6 (L) 09/11/2019 0510   ALKPHOS 188 (H) 08/16/2019 1740   AST 40 08/16/2019 1740   AST 26 06/11/2019 1323   ALT 37 08/16/2019 1740   ALT 25 06/11/2019 1323   BILITOT 2.2 (H) 08/16/2019 1740   BILITOT 1.9 (H) 08/13/2019 1437   BILITOT 1.5 (H) 06/11/2019 1323       RADIOGRAPHIC STUDIES: IR Fluoro Guide CV Line Right  Result Date: 08/23/2019 CLINICAL DATA:  Renal failure. Needs durable venous  access for hemodialysis. EXAM: TUNNELED HEMODIALYSIS CATHETER PLACEMENT WITH ULTRASOUND AND FLUOROSCOPIC GUIDANCE TECHNIQUE: The procedure, risks, benefits, and alternatives were explained to the patient. Questions regarding the procedure were encouraged and answered. The patient understands and consents to the procedure. As antibiotic prophylaxis, cefazolin 2 g was ordered pre-procedure and administered intravenously within one hour of incision. Patency of the right IJ vein was confirmed with ultrasound with image documentation. An appropriate skin site was determined. Region was prepped using maximum barrier technique including cap and mask, sterile gown, sterile gloves, large sterile sheet, and Chlorhexidine as cutaneous antisepsis. The region was infiltrated locally with 1% lidocaine. Intravenous Fentanyl 83mcg and Versed 0.5mg  were administered as conscious sedation during continuous monitoring of the patient's level of consciousness and physiological / cardiorespiratory status by the radiology RN, with a total moderate sedation time of 13 minutes. Under real-time ultrasound guidance, the right IJ vein was accessed with a 21  gauge micropuncture needle; the needle tip within the vein was confirmed with ultrasound image documentation. Needle exchanged over the 018 guidewire for transitional dilator, which allowed advancement of a Benson wire into the IVC. Over this, an MPA catheter was advanced. A Palindrome 19 hemodialysis catheter was tunneled from the right anterior chest wall approach to the right IJ dermatotomy site. The MPA catheter was exchanged over an Amplatz wire for serial vascular dilators which allow placement of a peel-away sheath, through which the catheter was advanced under intermittent fluoroscopy, positioned with its tips in the proximal and midright atrium. Spot chest radiograph confirms good catheter position. No pneumothorax. Catheter was flushed and primed per protocol. Catheter secured  externally with O Prolene sutures. The right IJ dermatotomy site was closed with Dermabond. COMPLICATIONS: COMPLICATIONS None immediate FLUOROSCOPY TIME:  0.2 minutes; 29 uGym2 DAP COMPARISON:  None IMPRESSION: 1. Technically successful placement of tunneled right IJ hemodialysis catheter with ultrasound and fluoroscopic guidance. Ready for routine use. ACCESS: Remains approachable for percutaneous intervention as needed. Electronically Signed   By: Lucrezia Europe M.D.   On: 08/23/2019 15:18   IR US Guide Vasc Access Right  Result Date: 08/23/2019 CLINICAL DATA:  Renal failure. Needs durable venous access for hemodialysis. EXAM: TUNNELED HEMODIALYSIS CATHETER PLACEMENT WITH ULTRASOUND AND FLUOROSCOPIC GUIDANCE TECHNIQUE: The procedure, risks, benefits, and alternatives were explained to the patient. Questions regarding the procedure were encouraged and answered. The patient understands and consents to the procedure. As antibiotic prophylaxis, cefazolin 2 g was ordered pre-procedure and administered intravenously within one hour of incision. Patency of the right IJ vein was confirmed with ultrasound with image documentation. An appropriate skin site was determined. Region was prepped using maximum barrier technique including cap and mask, sterile gown, sterile gloves, large sterile sheet, and Chlorhexidine as cutaneous antisepsis. The region was infiltrated locally with 1% lidocaine. Intravenous Fentanyl 42mcg and Versed 0.5mg  were administered as conscious sedation during continuous monitoring of the patient's level of consciousness and physiological / cardiorespiratory status by the radiology RN, with a total moderate sedation time of 13 minutes. Under real-time ultrasound guidance, the right IJ vein was accessed with a 21 gauge micropuncture needle; the needle tip within the vein was confirmed with ultrasound image documentation. Needle exchanged over the 018 guidewire for transitional dilator, which allowed  advancement of a Benson wire into the IVC. Over this, an MPA catheter was advanced. A Palindrome 19 hemodialysis catheter was tunneled from the right anterior chest wall approach to the right IJ dermatotomy site. The MPA catheter was exchanged over an Amplatz wire for serial vascular dilators which allow placement of a peel-away sheath, through which the catheter was advanced under intermittent fluoroscopy, positioned with its tips in the proximal and midright atrium. Spot chest radiograph confirms good catheter position. No pneumothorax. Catheter was flushed and primed per protocol. Catheter secured externally with O Prolene sutures. The right IJ dermatotomy site was closed with Dermabond. COMPLICATIONS: COMPLICATIONS None immediate FLUOROSCOPY TIME:  0.2 minutes; 29 uGym2 DAP COMPARISON:  None IMPRESSION: 1. Technically successful placement of tunneled right IJ hemodialysis catheter with ultrasound and fluoroscopic guidance. Ready for routine use. ACCESS: Remains approachable for percutaneous intervention as needed. Electronically Signed   By: Lucrezia Europe M.D.   On: 08/23/2019 15:18   DG CHEST PORT 1 VIEW  Result Date: 09/01/2019 CLINICAL DATA:  Shortness of breath EXAM: PORTABLE CHEST 1 VIEW COMPARISON:  August 18, 2019 chest radiograph and chest CT July 07, 2019 FINDINGS: There is scarring in the  right base. There is no edema or airspace opacity. Heart is mildly enlarged with pulmonary vascularity normal. Patient is status post median sternotomy. Pacemaker lead attached to right ventricle, stable. Central catheter tip in superior vena cava. No pneumothorax. No bone lesions. IMPRESSION: Scarring right base. Lungs otherwise clear. Stable cardiac silhouette. Postoperative changes noted. Central catheter tip in superior vena cava. Electronically Signed   By: Lowella Grip III M.D.   On: 09/01/2019 10:03   ECHO TEE  Result Date: 09/05/2019    TRANSESOPHOGEAL ECHO REPORT   Patient Name:   MR. Ricardo Payne Date of Exam: 08/22/2019 Medical Rec #:  [id number]               Height:       66.0 in Accession #:    [id number]              Weight:       224.0 lb Date of Birth:  08/06/1930                BSA:          2.098 m Patient Age:    31 years                BP:           110/70 mmHg Patient Gender: M                       HR:           94 bpm. Exam Location:  Inpatient Procedure: Transesophageal Echo, Cardiac Doppler and Color Doppler Indications:     Atrial fibrillation  History:         Patient has prior history of Echocardiogram examinations, most                  recent 07/09/2019. CHF, CAD, Prior CABG, Stroke and PAD;                  Arrythmias:Atrial Fibrillation.  Sonographer:     Dustin Flock Referring Phys:  [id number] AMY D CLEGG Diagnosing Phys: Loralie Champagne MD PROCEDURE: The transesophogeal probe was passed without difficulty through the esophogus of the patient. Sedation performed by performing physician. The patient was monitored while under deep sedation. Anesthestetic sedation was provided intravenously by  Anesthesiology: 93.47mg  of Propofol. The patient developed no complications during the procedure. IMPRESSIONS  1. Left ventricular ejection fraction, by estimation, is <20%. The left ventricle has severely decreased function. The left ventricle demonstrates global hypokinesis. The left ventricular internal cavity size was mildly dilated.  2. Right ventricular systolic function is moderately reduced. The right ventricular size is moderately enlarged. There is moderately elevated pulmonary artery systolic pressure.  3. No ASD or PFO by color doppler. Left atrial size was moderately dilated. No left atrial/left atrial appendage thrombus was detected.  4. Right atrial size was moderately dilated.  5. Peak RV-RA gradient 36 mmHg. Tricuspid valve regurgitation is severe.  6. Moderately calcified aortic valve with trivial regurgitation. There was no significant aortic stenosis, mean gradient 8 mmHg.   7. Moderate mitral regurgitation with restricted posterior mitral leaflet. . The mitral valve is abnormal. Moderate mitral valve regurgitation.  8. Normal caliber thoracic aorta with grade 3 plaque. FINDINGS  Left Ventricle: Left ventricular ejection fraction, by estimation, is <20%. The left ventricle has severely decreased function. The left ventricle demonstrates global hypokinesis. The left ventricular internal cavity size was mildly dilated. There  is no  left ventricular hypertrophy. Right Ventricle: The right ventricular size is moderately enlarged. No increase in right ventricular wall thickness. Right ventricular systolic function is moderately reduced. There is moderately elevated pulmonary artery systolic pressure. The tricuspid  regurgitant velocity is 3.01 m/s, and with an assumed right atrial pressure of 10 mmHg, the estimated right ventricular systolic pressure is 82.5 mmHg. Left Atrium: No ASD or PFO by color doppler. Left atrial size was moderately dilated. No left atrial/left atrial appendage thrombus was detected. Right Atrium: Right atrial size was moderately dilated. Pericardium: There is no evidence of pericardial effusion. Mitral Valve: Moderate mitral regurgitation with restricted posterior mitral leaflet. The mitral valve is abnormal. Moderate mitral valve regurgitation. Tricuspid Valve: Peak RV-RA gradient 36 mmHg. The tricuspid valve is normal in structure. Tricuspid valve regurgitation is severe. Aortic Valve: Moderately calcified aortic valve with trivial regurgitation. There was no significant aortic stenosis, mean gradient 8 mmHg. The aortic valve is tricuspid. Aortic valve regurgitation is trivial. Aortic valve mean gradient measures 7.5 mmHg. Aortic valve peak gradient measures 14.3 mmHg. Pulmonic Valve: The pulmonic valve was normal in structure. Pulmonic valve regurgitation is not visualized. Aorta: Normal caliber thoracic aorta with grade 3 plaque. The aortic root is normal in size  and structure. IAS/Shunts: No atrial level shunt detected by color flow Doppler. Additional Comments: A pacer wire is visualized in the right ventricle.  AORTIC VALVE AV Vmax:      189.00 cm/s AV Vmean:     126.000 cm/s AV VTI:       0.314 m AV Peak Grad: 14.3 mmHg AV Mean Grad: 7.5 mmHg TRICUSPID VALVE TR Peak grad:   36.2 mmHg TR Vmax:        301.00 cm/s Loralie Champagne MD Electronically signed by Loralie Champagne MD Signature Date/Time: 09/05/2019/3:42:08 PM    Final    [place] EKG SITE RITE  Result Date: 08/25/2019 If Site Rite image not attached, placement could not be confirmed due to current cardiac rhythm.   ASSESSMENT AND PLAN: This is a very pleasant 86 years old white male with history of idiopathic thrombocytopenic purpura as well as iron deficiency anemia.  The patient also has multiple comorbidities including congestive heart failure, coronary artery disease, atrial fibrillation as well as end-stage renal disease and was on hemodialysis. He is currently on a tapered dose of prednisone for the idiopathic thrombocytopenic purpura started during his hospitalization.  He is on 80 mg p.o. daily.  I recommended for the patient to start tapering his prednisone dose to 60 mg p.o. daily for 1 week followed by 40 mg p.o. daily for 1 week followed by 20 mg p.o. daily for 1 week.  I will see him back for follow-up visit in 3 weeks for reevaluation and further recommendation regarding his condition. The patient was advised to call immediately if he has any other concerning symptoms in the interval. The patient voices understanding of current disease status and treatment options and is in agreement with the current care plan.  All questions were answered. The patient knows to call the clinic with any problems, questions or concerns. We can certainly see the patient much sooner if necessary.  Disclaimer: This note was dictated with voice recognition software. Similar sounding words can inadvertently be transcribed  and may not be corrected upon review.

## 2019-09-20 ENCOUNTER — Telehealth: Payer: Self-pay

## 2019-09-20 ENCOUNTER — Telehealth: Payer: Self-pay | Admitting: Medical Oncology

## 2019-09-20 ENCOUNTER — Other Ambulatory Visit: Payer: Self-pay | Admitting: Medical Oncology

## 2019-09-20 NOTE — Telephone Encounter (Signed)
Phone call placed to patient to introduce Palliative care and to offer to schedule a visit with NP. Patient receptive to scheduling visit and verbal consent obtained. Visit scheduled for 10/02/2019 @ 1pm

## 2019-09-20 NOTE — Telephone Encounter (Signed)
Medication dose reduction- Sevelamer changed from tid w meals to daily .

## 2019-09-21 ENCOUNTER — Encounter (HOSPITAL_COMMUNITY): Payer: Self-pay

## 2019-09-21 ENCOUNTER — Ambulatory Visit (HOSPITAL_COMMUNITY): Admit: 2019-09-21 | Payer: Medicare Other | Admitting: Cardiology

## 2019-09-21 ENCOUNTER — Other Ambulatory Visit: Payer: Self-pay

## 2019-09-21 ENCOUNTER — Telehealth (HOSPITAL_COMMUNITY): Payer: Self-pay

## 2019-09-21 NOTE — Telephone Encounter (Signed)
Pt arrived almost an hour late for appointment. At which time, Dr Aundra Dubin in cath procedure. Pt was to see NP Amy however pt noted he had Dialysis at 12pm and had to go.  Pt states he was late because gps was taking him in circles.  He reports he was very tired today because he received his second covid inj yesterday. Pt wanted to reschedule his appointment to next week. Pt states he feels ok driving. I advised I would call him to give him appointment details.  I left message asking him to call office to discuss

## 2019-09-21 NOTE — Telephone Encounter (Signed)
Called patient to confirm appointment time. Pt confirmed that 3:40p on Monday to see Dr Aundra Dubin works fine.

## 2019-09-24 ENCOUNTER — Inpatient Hospital Stay: Payer: Medicare Other | Admitting: Internal Medicine

## 2019-09-24 ENCOUNTER — Other Ambulatory Visit: Payer: Self-pay

## 2019-09-24 ENCOUNTER — Inpatient Hospital Stay: Payer: Medicare Other

## 2019-09-24 ENCOUNTER — Ambulatory Visit (HOSPITAL_COMMUNITY)
Admission: RE | Admit: 2019-09-24 | Discharge: 2019-09-24 | Disposition: A | Discharge status: Home / Self Care | Payer: Medicare Other | Source: Ambulatory Visit | Attending: Cardiology | Admitting: Cardiology

## 2019-09-24 VITALS — BP 122/60 | HR 91 | Wt 210.8 lb

## 2019-09-24 DIAGNOSIS — I480 Paroxysmal atrial fibrillation: Secondary | ICD-10-CM | POA: Diagnosis not present

## 2019-09-24 DIAGNOSIS — N186 End stage renal disease: Secondary | ICD-10-CM | POA: Diagnosis not present

## 2019-09-24 DIAGNOSIS — Z79899 Other long term (current) drug therapy: Secondary | ICD-10-CM | POA: Insufficient documentation

## 2019-09-24 DIAGNOSIS — I5042 Chronic combined systolic (congestive) and diastolic (congestive) heart failure: Secondary | ICD-10-CM | POA: Diagnosis not present

## 2019-09-24 DIAGNOSIS — Z8673 Personal history of transient ischemic attack (TIA), and cerebral infarction without residual deficits: Secondary | ICD-10-CM | POA: Diagnosis not present

## 2019-09-24 DIAGNOSIS — D696 Thrombocytopenia, unspecified: Secondary | ICD-10-CM | POA: Diagnosis not present

## 2019-09-24 DIAGNOSIS — E785 Hyperlipidemia, unspecified: Secondary | ICD-10-CM | POA: Insufficient documentation

## 2019-09-24 DIAGNOSIS — Z7952 Long term (current) use of systemic steroids: Secondary | ICD-10-CM | POA: Insufficient documentation

## 2019-09-24 DIAGNOSIS — I255 Ischemic cardiomyopathy: Secondary | ICD-10-CM | POA: Insufficient documentation

## 2019-09-24 DIAGNOSIS — Z8249 Family history of ischemic heart disease and other diseases of the circulatory system: Secondary | ICD-10-CM | POA: Insufficient documentation

## 2019-09-24 DIAGNOSIS — I5022 Chronic systolic (congestive) heart failure: Secondary | ICD-10-CM | POA: Insufficient documentation

## 2019-09-24 DIAGNOSIS — I2510 Atherosclerotic heart disease of native coronary artery without angina pectoris: Secondary | ICD-10-CM | POA: Insufficient documentation

## 2019-09-24 DIAGNOSIS — Z951 Presence of aortocoronary bypass graft: Secondary | ICD-10-CM | POA: Insufficient documentation

## 2019-09-24 DIAGNOSIS — Z87891 Personal history of nicotine dependence: Secondary | ICD-10-CM | POA: Diagnosis not present

## 2019-09-24 DIAGNOSIS — M109 Gout, unspecified: Secondary | ICD-10-CM | POA: Diagnosis not present

## 2019-09-24 DIAGNOSIS — Z7901 Long term (current) use of anticoagulants: Secondary | ICD-10-CM | POA: Insufficient documentation

## 2019-09-24 LAB — COMPREHENSIVE METABOLIC PANEL
ALT: 52 U/L — ABNORMAL HIGH (ref 0–44)
AST: 52 U/L — ABNORMAL HIGH (ref 15–41)
Albumin: 2.7 g/dL — ABNORMAL LOW (ref 3.5–5.0)
Alkaline Phosphatase: 187 U/L — ABNORMAL HIGH (ref 38–126)
Anion gap: 17 — ABNORMAL HIGH (ref 5–15)
BUN: 40 mg/dL — ABNORMAL HIGH (ref 8–23)
CO2: 24 mmol/L (ref 22–32)
Calcium: 8.5 mg/dL — ABNORMAL LOW (ref 8.9–10.3)
Chloride: 93 mmol/L — ABNORMAL LOW (ref 98–111)
Creatinine, Ser: 3.07 mg/dL — ABNORMAL HIGH (ref 0.61–1.24)
GFR calc Af Amer: 20 mL/min — ABNORMAL LOW (ref >60)
GFR calc non Af Amer: 17 mL/min — ABNORMAL LOW (ref >60)
Glucose, Bld: 157 mg/dL — ABNORMAL HIGH (ref 70–99)
Potassium: 4.3 mmol/L (ref 3.5–5.1)
Sodium: 134 mmol/L — ABNORMAL LOW (ref 135–145)
Total Bilirubin: 3.1 mg/dL — ABNORMAL HIGH (ref 0.3–1.2)
Total Protein: 6.5 g/dL (ref 6.5–8.1)

## 2019-09-24 LAB — CBC
HCT: 31.4 % — ABNORMAL LOW (ref 39.0–52.0)
Hemoglobin: 10.5 g/dL — ABNORMAL LOW (ref 13.0–17.0)
MCH: 32.5 pg (ref 26.0–34.0)
MCHC: 33.4 g/dL (ref 30.0–36.0)
MCV: 97.2 fL (ref 80.0–100.0)
Platelets: 42 10*3/uL — ABNORMAL LOW (ref 150–400)
RBC: 3.23 MIL/uL — ABNORMAL LOW (ref 4.22–5.81)
RDW: 18.6 % — ABNORMAL HIGH (ref 11.5–15.5)
WBC: 8.1 10*3/uL (ref 4.0–10.5)
nRBC: 0 % (ref 0.0–0.2)

## 2019-09-24 LAB — TSH: TSH: 1.606 u[IU]/mL (ref 0.350–4.500)

## 2019-09-24 MED ORDER — APIXABAN 5 MG PO TABS: 5.0000 mg | ORAL_TABLET | Freq: Two times a day (BID) | ORAL | 3 refills | Status: DC

## 2019-09-24 MED ORDER — ROSUVASTATIN CALCIUM 10 MG PO TABS: 10.0000 mg | ORAL_TABLET | Freq: Every day | ORAL | 3 refills | Status: AC

## 2019-09-24 NOTE — Patient Instructions (Addendum)
Labs done today. We will contact you only if your labs are abnormal.  RESTART Eliquis 5mg  Take 1 tablet by mouth twice daily.  Your Crestor 10mg  was refilled.  No other medication changes were made. Please continue all other medication as prescribed.   Your physician recommends that you schedule a follow-up appointment in: 6 weeks for an appointment with our [facility](our office)   Do the following things EVERYDAY: 1) Weigh yourself in the morning before breakfast. Write it down and keep it in a log. 2) Take your medicines as prescribed 3) Eat low salt foods--Limit salt (sodium) to 2000 mg per day.  4) Stay as active as you can everyday 5) Limit all fluids for the day to less than 2 liters   At the [facility], you and your health needs are our priority. As part of our continuing mission to provide you with exceptional heart care, we have created designated Provider Care Teams. These Care Teams include your primary Cardiologist (physician) and Advanced Practice Providers (APPs- Physician Assistants and Nurse Practitioners) who all work together to provide you with the care you need, when you need it.   You may see any of the following providers on your designated Care Team at your next follow up: Marland Kitchen Dr Glori Bickers . Dr Loralie Champagne . Darrick Grinder, NP . Lyda Jester, PA . Audry Riles, PharmD   Please be sure to bring in all your medications bottles to every appointment.

## 2019-09-25 ENCOUNTER — Encounter (HOSPITAL_COMMUNITY): Payer: Medicare Other

## 2019-09-25 ENCOUNTER — Telehealth (HOSPITAL_COMMUNITY): Payer: Self-pay

## 2019-09-25 DIAGNOSIS — I5042 Chronic combined systolic (congestive) and diastolic (congestive) heart failure: Secondary | ICD-10-CM

## 2019-09-25 NOTE — Progress Notes (Signed)
PCP: [name], FNP HF Cardiology: Dr. Aundra Dubin  86 y.o. with history of CAD s/p CABG, ischemic cardiomyopathy with RV failure, paroxysmal atrial fibrillation, and ESRD presents for followup of CHF.  He initially established care with Dr. Gardiner Rhyme as outpatient in 03/2019 after moving from [place] to [place] in 01/2019. Symptoms were initially well controlled on Bumex, spironolactone, and carvedilol, though addition of ACEi/ARB/ARNI were limited by CKD. He was noted to have hyperkalemia and his spironolactone was subsequently discontinued. He had an echocardiogram in [place] 07/2018 which showed EF 25-30%. Echo 04/2019 showed improvement in EF 40-45%.   He had several admissions to the hospital for acute on chronic systolic CHF since he arrived in [place]. He was admitted 07/06/2019-07/12/2019 for IV diuresis, at which time an echocardiogram was repeated and revealed EF had dropped again, now <20%. His carvedilol was discontinued and he was started on midodrine that admission for hemodynamic support; discharged home on torsemide 10 mg daily. His hospital course was further complicated by acute on chronic renal insufficiency. He was readmitted shortly there after from 07/18/19-07/20/19 for IV diuresis and was discharged home on torsemide 20mg  daily. He has followed with nephrology.   Due to marked volume overload, he was re-admitted in 3/21.  He was noted to be in atrial fibrillation with RVR.  He had a prolonged, complicated admission. He was in low output HF and was started on milrinone.  He was very difficult to diurese and creatinine rose steadily.  CVP was above 30 initially.  He eventually was started on HD due to progressive renal failure and volume overload.  Milrinone was weaned off to midodrine.  He was started on amiodarone and underwent TEE-guided DCCV to NSR.  Hospital stay was complicated by worsened thrombocytopenia (he has baseline ITP) and steroids were started.  Eliquis had to be stopped.   He tolerated HD but was never fully diuresed (partly, it appeared, due to limited space and time on the inpatient HD unit).  He was sent home and is now getting outpatient HD.   He is currently short of breath walking about 50 yards with his walker.  No orthopnea/PND.  He has felt more fatigued since recent [place] vaccination (his second).  He remains in NSR today.  Still with significant peripheral edema.  No lightheadedness or syncope.  No palpitations, no chest pain.   ECG (personally reviewed): NSR, LBBB (146 msec)  Labs (4/21): hgb 11, plts 62K  PMH: 1. H/o CVA: Likely related to atrial fibrillation 2. ITP: Followed by hematology.  3. Gout 4. Atrial fibrillation: Paroxysmal.  5. Hyperlipidemia 6. Chronic systolic CHF: Ischemic cardiomyopathy.  - TEE (3/21): EF < 20%, global hypokinesis, moderately dilated RV with moderately decreased systolic function, moderate MR, severe TR.  - St Jude ICD 7. Tricuspid regurgitation: Severe on 3/21 TEE.  8. ESRD 9. CAD: s/p CABG x 1 in 1990.  10. PAD: Prior evaluation is not available.   Social History   Socioeconomic History  . Marital status: Widowed    Spouse name: Not on file  . Number of children: 2  . Years of education: Not on file  . Highest education level: Not on file  Occupational History  . Not on file  Tobacco Use  . Smoking status: Former Smoker    Quit date: 1988    Years since quitting: 33.3  . Smokeless tobacco: Never Used  Substance and Sexual Activity  . Alcohol use: Yes    Comment: rare  . Drug use:  Never  . Sexual activity: Not on file  Other Topics Concern  . Not on file  Social History Narrative  . Not on file   Social Determinants of Health   Financial Resource Strain:   . Difficulty of Paying Living Expenses:   Food Insecurity:   . Worried About Charity fundraiser in the Last Year:   . Arboriculturist in the Last Year:   Transportation Needs:   . Film/video editor (Medical):   Marland Kitchen Lack of  Transportation (Non-Medical):   Physical Activity:   . Days of Exercise per Week:   . Minutes of Exercise per Session:   Stress:   . Feeling of Stress :   Social Connections:   . Frequency of Communication with Friends and Family:   . Frequency of Social Gatherings with Friends and Family:   . Attends Religious Services:   . Active Member of Clubs or Organizations:   . Attends Archivist Meetings:   Marland Kitchen Marital Status:   Intimate Partner Violence:   . Fear of Current or Ex-Partner:   . Emotionally Abused:   Marland Kitchen Physically Abused:   . Sexually Abused:    Family History  Problem Relation Age of Onset  . Hypertension Mother   . Suicidality Mother   . Valvular heart disease Father   . Heart disease Father   . Heart attack Brother    ROS: All systems reviewed and negative except as per HPI.   Current Outpatient Medications  Medication Sig Dispense Refill  . allopurinol (ZYLOPRIM) 100 MG tablet Take 0.5 tablets (50 mg total) by mouth every other day. 15 tablet 0  . amiodarone (PACERONE) 200 MG tablet Take 1 tablet (200 mg total) by mouth daily. 30 tablet 6  . L-Methylfolate-B6-B12 (FOLTANX) 3-35-2 MG TABS Take 1 tablet by mouth daily. 30 tablet 6  . midodrine (PROAMATINE) 10 MG tablet Take 1.5 tablets (15 mg total) by mouth with breakfast, with lunch, and with evening meal. 135 tablet 6  . multivitamin (RENA-VIT) TABS tablet Take 1 tablet by mouth at bedtime. 30 tablet 6  . pramipexole (MIRAPEX) 0.25 MG tablet Take 0.25 mg by mouth 2 (two) times daily.    . predniSONE (DELTASONE) 20 MG tablet Take 4 tablets (80 mg total) by mouth daily with breakfast. 120 tablet 6  . rosuvastatin (CRESTOR) 10 MG tablet Take 1 tablet (10 mg total) by mouth daily. 90 tablet 3  . sevelamer carbonate (RENVELA) 800 MG tablet Take 3 tablets (2,400 mg total) by mouth 3 (three) times daily with meals. 270 tablet 6  . tamsulosin (FLOMAX) 0.4 MG CAPS capsule Take 1 capsule (0.4 mg total) by mouth daily.  30 capsule 0  . apixaban (ELIQUIS) 5 MG TABS tablet Take 1 tablet (5 mg total) by mouth 2 (two) times daily. 180 tablet 3   No current facility-administered medications for this encounter.   BP 122/60   Pulse 91   Wt 95.6 kg   SpO2 94%   BMI 34.02 kg/m  General: NAD Neck: JVP 14-16 cm, no thyromegaly or thyroid nodule.  Lungs: Clear to auscultation bilaterally with normal respiratory effort. CV: Nondisplaced PMI.  Heart regular S1/S2, no S3/S4, 1/6 HSM LLSB.  2+ edema to knees bilaterally.  No carotid bruit.  Normal pedal pulses.  Abdomen: Soft, nontender, no hepatosplenomegaly, no distention.  Skin: Intact without lesions or rashes.  Neurologic: Alert and oriented x 3.  Psych: Normal affect. Extremities: No clubbing or cyanosis.  HEENT: Normal.   Assessment/Plan: 1. Chronic systolic CHF: Ischemic cardiomyopathy. St Jude ICD. Echo in 2/21 with EF <20%, mildly dilated RV with moderately decreased systolic function, mild AS, mild-moderate MR, severe TR. CHF has been complicated by cardiorenal syndrome and recurrent atrial fibrillation with RVR. He has started HD for volume management.  CVP was markedly high in the hospital and he was never fully dialyzed to dry weight.  I suspect that it will be hard to get him to a "normal" CVP due to RV failure and severe TR, but he remains significantly volume overloaded.  - Continue midodrine 15 mg tid to maintain BP.  - He needs more aggressive dialysis for volume removal, either more time at his regular sessions or with addition of a 4th session each week.  2. CAD: S/p remote CABG (1990). He does not think he has had a coronary evaluation since that time. HS-TnI not elevated at last hospitalization.  No chest pain.  - Refill Crestor.   - No ASA given anticoagulation.  3. Atrial fibrillation: Paroxysmal. He was admitted in 3/21 with atrial fibrillation with RVR. This may have triggered his CHF exacerbation (though based on device  interrogation, worsening volume status does not seem to necessarily track with atrial fibrillation) or may be a consequence of the exacerbation. TEE-guided DCCV done 08/22/19, remains in NSR.  - Continue amiodarone 200 mg daily, check LFTs, TSH today.  He will need a regular eye exam on amiodarone.   - Eliquis will remain on hold until plts > 50K.   4. ESRD: Tolerating HD so far, but as above, needs more aggressive fluid removal.  5. PAD: Patient reports peripheral intervention though I do not have records of evaluation.  No claudication-type symptoms.  6. CVA: In past. Likely related to atrial fibrillation. - Restart Eliquis when plts improved, >50K.  7. Thrombocytopenia: History of ITP, follows with hematology as outpatient.  Plts dropped very low during 3/21 hospitalization.  He was started on steroids.  - Continue prednisone per hematology.  - Check CBC today, if plts > 50K, can start Eliquis 2.5 mg bid (age, creatinine).  Loralie Champagne 09/25/2019

## 2019-09-25 NOTE — Telephone Encounter (Signed)
-----   Message from Larey Dresser, MD sent at 09/24/2019  9:56 PM EDT ----- Platelets still less than 50K.  Do not start Eliquis yet, repeat CBC in 1 week to recheck.

## 2019-09-25 NOTE — Telephone Encounter (Signed)
Pt aware to not start Eliquis. Pt will return clinic in 1 week for cbc

## 2019-10-02 ENCOUNTER — Other Ambulatory Visit: Payer: Self-pay

## 2019-10-02 ENCOUNTER — Encounter: Payer: Self-pay | Admitting: Internal Medicine

## 2019-10-02 ENCOUNTER — Ambulatory Visit (HOSPITAL_COMMUNITY)
Admission: RE | Admit: 2019-10-02 | Discharge: 2019-10-02 | Disposition: A | Discharge status: Home / Self Care | Payer: Medicare Other | Source: Ambulatory Visit | Attending: Internal Medicine | Admitting: Internal Medicine

## 2019-10-02 ENCOUNTER — Telehealth (HOSPITAL_COMMUNITY): Payer: Self-pay | Admitting: *Deleted

## 2019-10-02 ENCOUNTER — Other Ambulatory Visit: Payer: Medicare Other | Admitting: Internal Medicine

## 2019-10-02 DIAGNOSIS — Z515 Encounter for palliative care: Secondary | ICD-10-CM

## 2019-10-02 DIAGNOSIS — Z7189 Other specified counseling: Secondary | ICD-10-CM

## 2019-10-02 DIAGNOSIS — I5042 Chronic combined systolic (congestive) and diastolic (congestive) heart failure: Secondary | ICD-10-CM | POA: Insufficient documentation

## 2019-10-02 LAB — CBC
HCT: 32.4 % — ABNORMAL LOW (ref 39.0–52.0)
Hemoglobin: 10.5 g/dL — ABNORMAL LOW (ref 13.0–17.0)
MCH: 29.9 pg (ref 26.0–34.0)
MCHC: 32.4 g/dL (ref 30.0–36.0)
MCV: 92.3 fL (ref 80.0–100.0)
Platelets: 71 10*3/uL — ABNORMAL LOW (ref 150–400)
RBC: 3.51 MIL/uL — ABNORMAL LOW (ref 4.22–5.81)
RDW: 17.1 % — ABNORMAL HIGH (ref 11.5–15.5)
WBC: 9.7 10*3/uL (ref 4.0–10.5)
nRBC: 0 % (ref 0.0–0.2)

## 2019-10-02 NOTE — Telephone Encounter (Signed)
Pt seen for lab visit daughter in law reports his legs are really swollen and he is having shortness of breath. Per Dr.McLean pt needs to have more fluid pulled off during dialysis. Pts daughter in law aware and will speak with dialysis.

## 2019-10-02 NOTE — Progress Notes (Addendum)
May 4th, 2021 [facility] Palliative Care Consult Note Telephone: [phone]  Fax: [phone]  PATIENT NAME: Ricardo Payne DOB: Feb 01, 1931 MRN: [id number]  [place] [address] [phone] (Home Phone)  [phone] (Mobile)  PRIMARY CARE PROVIDER:   [name], FNP  REFERRING PROVIDER:  Kristen Payne, [place],  [place] [address]  [facility] M-W-F [facility] [phone] Dr. Curt Payne (oncology for  idiopathic thrombocytopenic purpura/iron deficiency anemia) Dr. Loralie Payne (Cardiology) [facility] following for home PT; next wk to see; will assess for home health aid and order if appropriate M W F and sat this week. Nutritionist/dr   RESPONSIBLE PARTY: Payne,Ricardo (Son)  [phone] (Mobile)  ASSESSMENT / RECOMMENDATIONS:  1. Advance Care Planning: A. Directives: DNR and MOST present in [facility] EMR. MOST: DNR/DNI. Limited Scope of Medical interventions *do not intubate. Yes to Antibiotics and IVFs. No feeding tube. I completed a DNR for patient to keep in his home. He has the MOST form. We reviewed and confirmed his MOST wishes. B. Goals of Care: to continue with diuresis; anxious to have less fluid in his legs. Hoping to gain in strength. Dr. Julien Payne for ITP; on prednisone taper.   2. Symptom Management:  A. Pain in LE exacerbated with fluid retention. Waxes and wanes. None currently.  -prn Tylenol  B. Dyspnea 2/2 cardiomyopathy/deconditioning: PT through [facility]. Patient refused their first two attempts to visit citing too weak. We discussed the importance of establishing with PT to start an exercise regimen towards regaining his strength and endurance. He is agreeable to their next meeting. I contacted [facility] and they will pass on request to evaluate pt for home health aide. Dyspnea with ambulation about 79ft. Resting  room air sats 93%. Lungs clear. Gradual decrease in LE edema on dialysis (scheduled 4 times this week) though pitting edema still persists to high thigh. Passes about 1 cup or urine a day. Following fluid restriction. Received his second COVID shot last week and feeling a bit of malaise since. No fever. Mild occasional dry cough.  -I reviewed and left copy of low impact exercises for patient; goal 10 sets each tid. Son to be patient's exercise buddy.    3. Cognitive / Functional status: patient is A & O x 3. Able to transfer and ambulate (with rolling walker) independently. Needs assist with bathing. Weight (09/24/19) 210lbs. At a height of 5'6" his BMI is 34kg/m2.  4. Family Supports: Widowed. Moved to [facility] Sept 2020 from [place] to be by his son. Has another son and daughter who live in the [place]  43. Follow up Palliative Care Visit: Thu 11/01/2019 @ noon I spent 60 minutes providing this consultation from 1pm to 2pm. More than 50% of the time in this consultation was spent coordinating communication.   HISTORY OF PRESENT ILLNESS:  Ricardo Payne is an 86 y.o. male with h/o combined systolic/diastolic CHF (ischemic cardiomyopathy EF 20%/past CABG), CAD, esophageal HTN, cirrhosis, acute on chronic kidney disease stage IV, PM/ICD (June 2020), thrombocytopenia, head trauma, anemia, dyspnea, CVA (2019 thought 2/2 afib; Eliquis), gout. Echo 2/20021: EF <20%, mild-mod MR, severe TR. Chronic gout, HTN, paroxysmal atrial fibrillation (Eliquis), restless legs, pulmonary HTN (secondary), thrombocytopenia.  3/18-4/13/2021 hospitalized volume overload (failed IV diuresis; iHD cath placed and started hemodialysis. Permanent access not placed d/t multi co-morbidities), Afib with RVR (TEE guided DCCV to NSR), thrombocytopenia (while  on Eliquis so Eliquis discontinued plan resume when Plts > 50k; started Prednisone rfu with Hematology as OP)  Multi prior admissions for CHF exacerbations  Palliative Care  was asked to help address goals of care.   CODE STATUS: DNR  PPS: 50% HOSPICE ELIGIBILITY/DIAGNOSIS: TBD  PAST MEDICAL HISTORY:  Past Medical History:  Diagnosis Date  . Acquired thrombophilia ([place])   . Age-related cataract of both eyes   . ASHD (arteriosclerotic heart disease)   . Benign prostatic hyperplasia   . CAD (coronary artery disease)    a. s/p CABG x1 in 1990  . Cerebellar stroke ([place]) 07/02/2016  . Chronic combined systolic and diastolic CHF (congestive heart failure) ([place])   . Chronic gout without tophus   . CKD (chronic kidney disease), stage IV ([place])   . CVA (cerebral vascular accident) ([place]) 2018  . Essential hypertension   . ICD (implantable cardioverter-defibrillator) in place   . Ischemic cardiomyopathy   . Mixed hyperlipidemia   . PAF (paroxysmal atrial fibrillation) ([place])   . Restless legs   . Secondary pulmonary arterial hypertension ([place])   . Thrombocytopenia ([place])   . Vitamin D deficiency     SOCIAL HX:  Social History   Tobacco Use  . Smoking status: Former Smoker    Quit date: 1988    Years since quitting: 33.3  . Smokeless tobacco: Never Used  Substance Use Topics  . Alcohol use: Yes    Comment: rare    ALLERGIES: No Known Allergies   PERTINENT MEDICATIONS:  Outpatient Encounter Medications as of 10/02/2019  Medication Sig  . allopurinol (ZYLOPRIM) 100 MG tablet Take 0.5 tablets (50 mg total) by mouth every other day.  Marland Kitchen amiodarone (PACERONE) 200 MG tablet Take 1 tablet (200 mg total) by mouth daily.  Marland Kitchen apixaban (ELIQUIS) 5 MG TABS tablet Take 1 tablet (5 mg total) by mouth 2 (two) times daily.  Marland Kitchen L-Methylfolate-B6-B12 (FOLTANX) 3-35-2 MG TABS Take 1 tablet by mouth daily.  . midodrine (PROAMATINE) 10 MG tablet Take 1.5 tablets (15 mg total) by mouth with breakfast, with lunch, and with evening meal.  . multivitamin (RENA-VIT) TABS tablet Take 1 tablet by mouth at bedtime.  . pramipexole (MIRAPEX) 0.25 MG tablet Take 0.25 mg by mouth 2  (two) times daily.  . predniSONE (DELTASONE) 20 MG tablet Take 4 tablets (80 mg total) by mouth daily with breakfast.  . rosuvastatin (CRESTOR) 10 MG tablet Take 1 tablet (10 mg total) by mouth daily.  . sevelamer carbonate (RENVELA) 800 MG tablet Take 3 tablets (2,400 mg total) by mouth 3 (three) times daily with meals.  . tamsulosin (FLOMAX) 0.4 MG CAPS capsule Take 1 capsule (0.4 mg total) by mouth daily.   No facility-administered encounter medications on file as of 10/02/2019.    PHYSICAL EXAM:  Pleasantly conversant, engaging, well nourished. Appears fatigued. Sitting up in recliner. Son in attendance Cardiovascular: regular rate and rhythm without MRG Pulmonary: clear ant fields, scant R posterior base insp crackles Abdomen: soft, nontender, + bowel sounds Extremities: pitting edema to high thigh, no joint deformities Neurological: Weakness but otherwise nonfocal  Julianne Handler, NP

## 2019-10-09 ENCOUNTER — Inpatient Hospital Stay (HOSPITAL_BASED_OUTPATIENT_CLINIC_OR_DEPARTMENT_OTHER): Payer: Medicare Other | Admitting: Internal Medicine

## 2019-10-09 ENCOUNTER — Other Ambulatory Visit: Payer: Self-pay

## 2019-10-09 ENCOUNTER — Encounter: Payer: Self-pay | Admitting: Internal Medicine

## 2019-10-09 ENCOUNTER — Inpatient Hospital Stay: Payer: Medicare Other | Attending: Physician Assistant

## 2019-10-09 ENCOUNTER — Telehealth: Payer: Self-pay | Admitting: Internal Medicine

## 2019-10-09 VITALS — BP 114/84 | HR 93 | Temp 97.8°F | Resp 18 | Ht 66.0 in | Wt 210.6 lb

## 2019-10-09 DIAGNOSIS — D696 Thrombocytopenia, unspecified: Secondary | ICD-10-CM | POA: Diagnosis not present

## 2019-10-09 DIAGNOSIS — I5042 Chronic combined systolic (congestive) and diastolic (congestive) heart failure: Secondary | ICD-10-CM | POA: Insufficient documentation

## 2019-10-09 DIAGNOSIS — N186 End stage renal disease: Secondary | ICD-10-CM | POA: Diagnosis not present

## 2019-10-09 DIAGNOSIS — Z79899 Other long term (current) drug therapy: Secondary | ICD-10-CM | POA: Diagnosis not present

## 2019-10-09 DIAGNOSIS — Z7901 Long term (current) use of anticoagulants: Secondary | ICD-10-CM | POA: Insufficient documentation

## 2019-10-09 DIAGNOSIS — Z992 Dependence on renal dialysis: Secondary | ICD-10-CM | POA: Diagnosis not present

## 2019-10-09 DIAGNOSIS — D509 Iron deficiency anemia, unspecified: Secondary | ICD-10-CM | POA: Diagnosis not present

## 2019-10-09 DIAGNOSIS — I2510 Atherosclerotic heart disease of native coronary artery without angina pectoris: Secondary | ICD-10-CM | POA: Diagnosis not present

## 2019-10-09 DIAGNOSIS — D693 Immune thrombocytopenic purpura: Secondary | ICD-10-CM | POA: Diagnosis present

## 2019-10-09 DIAGNOSIS — Z8673 Personal history of transient ischemic attack (TIA), and cerebral infarction without residual deficits: Secondary | ICD-10-CM | POA: Diagnosis not present

## 2019-10-09 DIAGNOSIS — R531 Weakness: Secondary | ICD-10-CM | POA: Insufficient documentation

## 2019-10-09 LAB — CBC WITH DIFFERENTIAL (CANCER CENTER ONLY)
Abs Immature Granulocytes: 0.54 10*3/uL — ABNORMAL HIGH (ref 0.00–0.07)
Basophils Absolute: 0.1 10*3/uL (ref 0.0–0.1)
Basophils Relative: 1 %
Eosinophils Absolute: 0.2 10*3/uL (ref 0.0–0.5)
Eosinophils Relative: 2 %
HCT: 31.8 % — ABNORMAL LOW (ref 39.0–52.0)
Hemoglobin: 10.2 g/dL — ABNORMAL LOW (ref 13.0–17.0)
Immature Granulocytes: 5 %
Lymphocytes Relative: 10 %
Lymphs Abs: 1 10*3/uL (ref 0.7–4.0)
MCH: 29.4 pg (ref 26.0–34.0)
MCHC: 32.1 g/dL (ref 30.0–36.0)
MCV: 91.6 fL (ref 80.0–100.0)
Monocytes Absolute: 0.6 10*3/uL (ref 0.1–1.0)
Monocytes Relative: 6 %
Neutro Abs: 8.1 10*3/uL — ABNORMAL HIGH (ref 1.7–7.7)
Neutrophils Relative %: 76 %
Platelet Count: 79 10*3/uL — ABNORMAL LOW (ref 150–400)
RBC: 3.47 MIL/uL — ABNORMAL LOW (ref 4.22–5.81)
RDW: 17.1 % — ABNORMAL HIGH (ref 11.5–15.5)
WBC Count: 10.5 10*3/uL (ref 4.0–10.5)
nRBC: 0 % (ref 0.0–0.2)

## 2019-10-09 LAB — CMP (CANCER CENTER ONLY)
ALT: 30 U/L (ref 0–44)
AST: 19 U/L (ref 15–41)
Albumin: 2.9 g/dL — ABNORMAL LOW (ref 3.5–5.0)
Alkaline Phosphatase: 165 U/L — ABNORMAL HIGH (ref 38–126)
Anion gap: 14 (ref 5–15)
BUN: 37 mg/dL — ABNORMAL HIGH (ref 8–23)
CO2: 28 mmol/L (ref 22–32)
Calcium: 8.6 mg/dL — ABNORMAL LOW (ref 8.9–10.3)
Chloride: 95 mmol/L — ABNORMAL LOW (ref 98–111)
Creatinine: 2.88 mg/dL — ABNORMAL HIGH (ref 0.61–1.24)
GFR, Est AFR Am: 22 mL/min — ABNORMAL LOW (ref >60)
GFR, Estimated: 19 mL/min — ABNORMAL LOW (ref >60)
Glucose, Bld: 127 mg/dL — ABNORMAL HIGH (ref 70–99)
Potassium: 4.4 mmol/L (ref 3.5–5.1)
Sodium: 137 mmol/L (ref 135–145)
Total Bilirubin: 1.6 mg/dL — ABNORMAL HIGH (ref 0.3–1.2)
Total Protein: 6.7 g/dL (ref 6.5–8.1)

## 2019-10-09 LAB — LACTATE DEHYDROGENASE: LDH: 237 U/L — ABNORMAL HIGH (ref 98–192)

## 2019-10-09 NOTE — Progress Notes (Signed)
Ricardo Payne:(336) [phone]   Fax:(336) [phone]  OFFICE PROGRESS NOTE  Ricardo Payne, [place] [address]  DIAGNOSIS: Iron deficiency anemia and thrombocytopenia  PRIOR THERAPY: none  CURRENT THERAPY: OTC iron supplements p.o. daily.   INTERVAL HISTORY: Ricardo Payne 86 y.o. male returns to the clinic today for follow-up visit.  The patient is currently on a tapered dose of prednisone down to 10 mg p.o. daily and expected to complete this course tomorrow.  He continues to complain of increasing fatigue and weakness since his second dose of the Covid vaccine with Moderna.  He denied having any current chest pain, shortness of breath, cough or hemoptysis.  He has no recent weight loss or night sweats.  He has no nausea, vomiting, diarrhea or constipation.  He denied having any bleeding, bruises or ecchymosis.  He currently undergoes dialysis 3 days a week.  The patient is here today for evaluation and repeat blood work.  MEDICAL HISTORY: Past Medical History:  Diagnosis Date  . Acquired thrombophilia ([place])   . Age-related cataract of both eyes   . ASHD (arteriosclerotic heart disease)   . Benign prostatic hyperplasia   . CAD (coronary artery disease)    a. s/p CABG x1 in 1990  . Cerebellar stroke ([place]) 07/02/2016  . Chronic combined systolic and diastolic CHF (congestive heart failure) ([place])   . Chronic gout without tophus   . CKD (chronic kidney disease), stage IV ([place])   . CVA (cerebral vascular accident) ([place]) 2018  . Essential hypertension   . ICD (implantable cardioverter-defibrillator) in place   . Ischemic cardiomyopathy   . Mixed hyperlipidemia   . PAF (paroxysmal atrial fibrillation) ([place])   . Restless legs   . Secondary pulmonary arterial hypertension ([place])   . Thrombocytopenia ([place])   . Vitamin D deficiency     ALLERGIES:  has No Known Allergies.  MEDICATIONS:  Current Outpatient Medications    Medication Sig Dispense Refill  . allopurinol (ZYLOPRIM) 100 MG tablet Take 0.5 tablets (50 mg total) by mouth every other day. 15 tablet 0  . amiodarone (PACERONE) 200 MG tablet Take 1 tablet (200 mg total) by mouth daily. 30 tablet 6  . apixaban (ELIQUIS) 5 MG TABS tablet Take 1 tablet (5 mg total) by mouth 2 (two) times daily. 180 tablet 3  . L-Methylfolate-B6-B12 (FOLTANX) 3-35-2 MG TABS Take 1 tablet by mouth daily. 30 tablet 6  . midodrine (PROAMATINE) 10 MG tablet Take 1.5 tablets (15 mg total) by mouth with breakfast, with lunch, and with evening meal. 135 tablet 6  . multivitamin (RENA-VIT) TABS tablet Take 1 tablet by mouth at bedtime. 30 tablet 6  . pramipexole (MIRAPEX) 0.25 MG tablet Take 0.25 mg by mouth 2 (two) times daily.    . predniSONE (DELTASONE) 20 MG tablet Take 4 tablets (80 mg total) by mouth daily with breakfast. 120 tablet 6  . rosuvastatin (CRESTOR) 10 MG tablet Take 1 tablet (10 mg total) by mouth daily. 90 tablet 3  . sevelamer carbonate (RENVELA) 800 MG tablet Take 3 tablets (2,400 mg total) by mouth 3 (three) times daily with meals. 270 tablet 6  . tamsulosin (FLOMAX) 0.4 MG CAPS capsule Take 1 capsule (0.4 mg total) by mouth daily. 30 capsule 0   No current facility-administered medications for this visit.    SURGICAL HISTORY:  Past Surgical History:  Procedure Laterality Date  . CARDIOVERSION N/A 08/22/2019   Procedure:  CARDIOVERSION;  Surgeon: Larey Dresser, MD;  Location: [facility] ENDOSCOPY;  Service: Cardiovascular;  Laterality: N/A;  . CHOLECYSTECTOMY    . CORONARY ARTERY BYPASS GRAFT    . IR FLUORO GUIDE CV LINE RIGHT  08/23/2019  . IR US GUIDE VASC ACCESS RIGHT  08/23/2019  . PACEMAKER IMPLANT  10/2018  . TEE WITHOUT CARDIOVERSION N/A 08/22/2019   Procedure: TRANSESOPHAGEAL ECHOCARDIOGRAM (TEE);  Surgeon: Larey Dresser, MD;  Location: [facility] ENDOSCOPY;  Service: Cardiovascular;  Laterality: N/A;    REVIEW OF SYSTEMS:  A comprehensive review of systems  was negative except for: Constitutional: positive for fatigue   PHYSICAL EXAMINATION: General appearance: alert, cooperative, fatigued and no distress Head: Normocephalic, without obvious abnormality, atraumatic Neck: no adenopathy, no JVD, supple, symmetrical, trachea midline and thyroid not enlarged, symmetric, no tenderness/mass/nodules Lymph nodes: Cervical, supraclavicular, and axillary nodes normal. Resp: clear to auscultation bilaterally Back: symmetric, no curvature. ROM normal. No CVA tenderness. Cardio: regular rate and rhythm, S1, S2 normal, no murmur, click, rub or gallop GI: soft, non-tender; bowel sounds normal; no masses,  no organomegaly Extremities: extremities normal, atraumatic, no cyanosis or edema  ECOG PERFORMANCE STATUS: 1 - Symptomatic but completely ambulatory  Blood pressure 114/84, pulse 93, temperature 97.8 F (36.6 C), temperature source Temporal, resp. rate 18, height 5\' 6"  (1.676 m), weight 210 lb 9.6 oz (95.5 kg), SpO2 97 %.  LABORATORY DATA: Lab Results  Component Value Date   WBC 10.5 10/09/2019   HGB 10.2 (L) 10/09/2019   HCT 31.8 (L) 10/09/2019   MCV 91.6 10/09/2019   PLT 79 (L) 10/09/2019      Chemistry      Component Value Date/Time   NA 134 (L) 09/24/2019 1648   NA 143 08/13/2019 1437   K 4.3 09/24/2019 1648   CL 93 (L) 09/24/2019 1648   CO2 24 09/24/2019 1648   BUN 40 (H) 09/24/2019 1648   BUN 52 (H) 08/13/2019 1437   CREATININE 3.07 (H) 09/24/2019 1648   CREATININE 3.53 (HH) 09/18/2019 1043      Component Value Date/Time   CALCIUM 8.5 (L) 09/24/2019 1648   ALKPHOS 187 (H) 09/24/2019 1648   AST 52 (H) 09/24/2019 1648   AST 47 (H) 09/18/2019 1043   ALT 52 (H) 09/24/2019 1648   ALT 72 (H) 09/18/2019 1043   BILITOT 3.1 (H) 09/24/2019 1648   BILITOT 1.7 (H) 09/18/2019 1043       RADIOGRAPHIC STUDIES: No results found.  ASSESSMENT AND PLAN: This is a very pleasant 86 years old white male with history of idiopathic  thrombocytopenic purpura as well as iron deficiency anemia.  The patient also has multiple comorbidities including congestive heart failure, coronary artery disease, atrial fibrillation as well as end-stage renal disease and was on hemodialysis. The patient was treated with a tapered dose of prednisone and currently down to 10 mg p.o. daily and will complete this course tomorrow.  He is feeling fine. Repeat CBC today showed a stable but low hemoglobin and hematocrit.  He also has platelets count of 79,000 which is better than few weeks ago. I recommended for the patient to continue on observation with repeat CBC in 3 months. The patient was advised to call immediately if he has any concerning symptoms especially any bleeding problem in the interval.  The patient voices understanding of current disease status and treatment options and is in agreement with the current care plan.  All questions were answered. The patient knows to call the clinic with  any problems, questions or concerns. We can certainly see the patient much sooner if necessary.  Disclaimer: This note was dictated with voice recognition software. Similar sounding words can inadvertently be transcribed and may not be corrected upon review.

## 2019-10-09 NOTE — Telephone Encounter (Signed)
Scheduled per 5/11 los. Printed AVS and calender.

## 2019-10-10 ENCOUNTER — Telehealth: Payer: Self-pay | Admitting: Medical Oncology

## 2019-10-10 NOTE — Telephone Encounter (Signed)
I have never seen this patient. Renvela is a medication prescribed by a kidney doctor. He needs to follow with his kidney team.   [place] T. Audie Box, [place]  [address] [place], [place] [address] [phone]  2:15 PM

## 2019-10-10 NOTE — Telephone Encounter (Addendum)
Does he need to decrease Renvela dose ?  Pt said he thought Dr Julien Nordmann told him to decrease the dose. He did not tell pt to make any changes with this drug.

## 2019-10-15 ENCOUNTER — Other Ambulatory Visit: Payer: Medicare Other | Admitting: Internal Medicine

## 2019-10-15 ENCOUNTER — Encounter: Payer: Self-pay | Admitting: Internal Medicine

## 2019-10-15 DIAGNOSIS — Z7189 Other specified counseling: Secondary | ICD-10-CM

## 2019-10-15 NOTE — Progress Notes (Signed)
May 4th, 2021 [facility] Palliative Care Consult Note Telephone: [phone]  Fax: [phone]  PATIENT NAME: Ricardo Payne DOB: 1930/09/07 MRN: [id number]  [place]  [facility] [address] [phone] (Home Phone)  [phone] (Mobile)  PRIMARY CARE PROVIDER:   [name], FNP  REFERRING PROVIDER:  [name], [place],  [place] [address]  [facility] M-W-F [facility] [phone] Dr. Curt Bears (oncology for  idiopathic thrombocytopenic purpura/iron deficiency anemia) Dr. Loralie Champagne (Cardiology) [facility] following for home PT; next wk to see; will assess for home health aid and order if appropriate M W F and sat this week. Nutritionist/dr   RESPONSIBLE PARTY: [name],[name] (Son). [name] D-I-L [phone] (Mobile)  ASSESSMENT / RECOMMENDATIONS:  1. Goals of Care discussion with D-I-L [name]. I reviewed and discussed patient's wishes as previously outlined last Palliative Care visit 10/02/2019: DNR. MOST: DNR/DNI. Limited Scope of Medical interventions *do not intubate. Yes to Antibiotics and IVFs. No feeding tube.  We reviewed who are the physicians on patient's care team. [name] was concerned about f/u with patient's nephrologist. I explained that he would be followed by the nephrology team at the time of patient's dialysis at [facility].  2. Follow up Palliative Care Visit: Thu 11/01/2019 @ noon I spent 60 minutes providing this consultation from 1pm to 2pm. More than 50% of the time in this consultation was spent coordinating communication.   HISTORY OF PRESENT ILLNESS:  Ricardo Payne is an 86 y.o. male with h/o combined systolic/diastolic CHF (ischemic cardiomyopathy EF 20%/past CABG), CAD, esophageal HTN, cirrhosis, acute on chronic kidney disease stage IV, PM/ICD (June 2020),  thrombocytopenia, head trauma, anemia, dyspnea, CVA (2019 thought 2/2 afib; Eliquis), gout. Echo 2/20021: EF <20%, mild-mod MR, severe TR. Chronic gout, HTN, paroxysmal atrial fibrillation (Eliquis), restless legs, pulmonary HTN (secondary), thrombocytopenia.  3/18-4/13/2021 hospitalized volume overload (failed IV diuresis; iHD cath placed and started hemodialysis. Permanent access not placed d/t multi co-morbidities), Afib with RVR (TEE guided DCCV to NSR), thrombocytopenia (while on Eliquis so Eliquis discontinued plan resume when Plts > 50k; started Prednisone rfu with Hematology as OP)  Multi prior admissions for CHF exacerbations  Palliative Care was asked to help address goals of care.   CODE STATUS: DNR  PPS: 50% HOSPICE ELIGIBILITY/DIAGNOSIS: TBD  PAST MEDICAL HISTORY:  Past Medical History:  Diagnosis Date  . Acquired thrombophilia ([place])   . Age-related cataract of both eyes   . ASHD (arteriosclerotic heart disease)   . Benign prostatic hyperplasia   . CAD (coronary artery disease)    a. s/p CABG x1 in 1990  . Cerebellar stroke ([place]) 07/02/2016  . Chronic combined systolic and diastolic CHF (congestive heart failure) ([place])   . Chronic gout without tophus   . CKD (chronic kidney disease), stage IV ([place])   . CVA (cerebral vascular accident) ([place]) 2018  . Essential hypertension   . ICD (implantable cardioverter-defibrillator) in place   . Ischemic cardiomyopathy   . Mixed hyperlipidemia   . PAF (paroxysmal atrial fibrillation) ([place])   . Restless legs   . Secondary pulmonary arterial hypertension ([place])   . Thrombocytopenia ([place])   . Vitamin D deficiency     SOCIAL HX:  Social History   Tobacco Use  . Smoking status: Former Smoker    Quit date: 1988    Years since quitting: 33.3  . Smokeless tobacco:  Never Used  Substance Use Topics  . Alcohol use: Yes    Comment: rare    ALLERGIES: No Known Allergies   PERTINENT MEDICATIONS:  Outpatient Encounter  Medications as of 10/15/2019  Medication Sig  . allopurinol (ZYLOPRIM) 100 MG tablet Take 0.5 tablets (50 mg total) by mouth every other day.  Marland Kitchen amiodarone (PACERONE) 200 MG tablet Take 1 tablet (200 mg total) by mouth daily.  Marland Kitchen apixaban (ELIQUIS) 5 MG TABS tablet Take 1 tablet (5 mg total) by mouth 2 (two) times daily.  Marland Kitchen L-Methylfolate-B6-B12 (FOLTANX) 3-35-2 MG TABS Take 1 tablet by mouth daily.  . multivitamin (RENA-VIT) TABS tablet Take 1 tablet by mouth at bedtime.  . pramipexole (MIRAPEX) 0.25 MG tablet Take 0.25 mg by mouth 2 (two) times daily.  . predniSONE (DELTASONE) 20 MG tablet Take 4 tablets (80 mg total) by mouth daily with breakfast.  . rosuvastatin (CRESTOR) 10 MG tablet Take 1 tablet (10 mg total) by mouth daily.  . tamsulosin (FLOMAX) 0.4 MG CAPS capsule Take 1 capsule (0.4 mg total) by mouth daily.   No facility-administered encounter medications on file as of 10/15/2019.    Julianne Handler, NP

## 2019-10-16 ENCOUNTER — Other Ambulatory Visit: Payer: Self-pay

## 2019-10-17 ENCOUNTER — Ambulatory Visit (INDEPENDENT_AMBULATORY_CARE_PROVIDER_SITE_OTHER): Payer: Medicare Other | Admitting: *Deleted

## 2019-10-17 DIAGNOSIS — I428 Other cardiomyopathies: Secondary | ICD-10-CM

## 2019-10-17 DIAGNOSIS — I509 Heart failure, unspecified: Secondary | ICD-10-CM | POA: Diagnosis not present

## 2019-10-17 LAB — CUP PACEART REMOTE DEVICE CHECK
Battery Remaining Longevity: 88 mo
Battery Remaining Percentage: 86 %
Battery Voltage: 3.05 V
Brady Statistic RV Percent Paced: 1 %
Date Time Interrogation Session: 20210519020021
HighPow Impedance: 53 Ohm
HighPow Impedance: 53 Ohm
Implantable Lead Implant Date: 20191018
Implantable Lead Location: 753860
Implantable Pulse Generator Implant Date: 20191018
Lead Channel Impedance Value: 330 Ohm
Lead Channel Pacing Threshold Amplitude: 0.75 V
Lead Channel Pacing Threshold Pulse Width: 0.5 ms
Lead Channel Sensing Intrinsic Amplitude: 11.4 mV
Lead Channel Setting Pacing Amplitude: 2 V
Lead Channel Setting Pacing Pulse Width: 0.5 ms
Lead Channel Setting Sensing Sensitivity: 0.5 mV
Pulse Gen Serial Number: 9850983

## 2019-10-18 ENCOUNTER — Other Ambulatory Visit: Payer: Self-pay

## 2019-10-18 ENCOUNTER — Emergency Department (HOSPITAL_COMMUNITY): Payer: Medicare Other

## 2019-10-18 ENCOUNTER — Encounter (HOSPITAL_COMMUNITY): Payer: Self-pay

## 2019-10-18 ENCOUNTER — Inpatient Hospital Stay (HOSPITAL_COMMUNITY)
Admission: EM | Admit: 2019-10-18 | Discharge: 2019-10-23 | DRG: 377 | Disposition: A | Discharge status: Home / Self Care | Payer: Medicare Other | Attending: Infectious Disease | Admitting: Infectious Disease

## 2019-10-18 DIAGNOSIS — Z87891 Personal history of nicotine dependence: Secondary | ICD-10-CM

## 2019-10-18 DIAGNOSIS — Z20822 Contact with and (suspected) exposure to covid-19: Secondary | ICD-10-CM | POA: Diagnosis present

## 2019-10-18 DIAGNOSIS — Z95810 Presence of automatic (implantable) cardiac defibrillator: Secondary | ICD-10-CM

## 2019-10-18 DIAGNOSIS — D631 Anemia in chronic kidney disease: Secondary | ICD-10-CM | POA: Diagnosis present

## 2019-10-18 DIAGNOSIS — G2581 Restless legs syndrome: Secondary | ICD-10-CM | POA: Diagnosis present

## 2019-10-18 DIAGNOSIS — R609 Edema, unspecified: Secondary | ICD-10-CM

## 2019-10-18 DIAGNOSIS — M1A9XX0 Chronic gout, unspecified, without tophus (tophi): Secondary | ICD-10-CM | POA: Diagnosis present

## 2019-10-18 DIAGNOSIS — D6859 Other primary thrombophilia: Secondary | ICD-10-CM | POA: Diagnosis present

## 2019-10-18 DIAGNOSIS — D696 Thrombocytopenia, unspecified: Secondary | ICD-10-CM | POA: Diagnosis present

## 2019-10-18 DIAGNOSIS — I959 Hypotension, unspecified: Secondary | ICD-10-CM | POA: Diagnosis present

## 2019-10-18 DIAGNOSIS — Z6833 Body mass index (BMI) 33.0-33.9, adult: Secondary | ICD-10-CM

## 2019-10-18 DIAGNOSIS — K921 Melena: Secondary | ICD-10-CM | POA: Diagnosis not present

## 2019-10-18 DIAGNOSIS — I9589 Other hypotension: Secondary | ICD-10-CM | POA: Diagnosis not present

## 2019-10-18 DIAGNOSIS — I420 Dilated cardiomyopathy: Secondary | ICD-10-CM | POA: Diagnosis present

## 2019-10-18 DIAGNOSIS — L03115 Cellulitis of right lower limb: Secondary | ICD-10-CM | POA: Diagnosis present

## 2019-10-18 DIAGNOSIS — I480 Paroxysmal atrial fibrillation: Secondary | ICD-10-CM | POA: Diagnosis present

## 2019-10-18 DIAGNOSIS — E782 Mixed hyperlipidemia: Secondary | ICD-10-CM | POA: Diagnosis not present

## 2019-10-18 DIAGNOSIS — I5042 Chronic combined systolic (congestive) and diastolic (congestive) heart failure: Secondary | ICD-10-CM | POA: Diagnosis present

## 2019-10-18 DIAGNOSIS — Z8249 Family history of ischemic heart disease and other diseases of the circulatory system: Secondary | ICD-10-CM

## 2019-10-18 DIAGNOSIS — Z8673 Personal history of transient ischemic attack (TIA), and cerebral infarction without residual deficits: Secondary | ICD-10-CM

## 2019-10-18 DIAGNOSIS — B3781 Candidal esophagitis: Secondary | ICD-10-CM | POA: Diagnosis present

## 2019-10-18 DIAGNOSIS — I132 Hypertensive heart and chronic kidney disease with heart failure and with stage 5 chronic kidney disease, or end stage renal disease: Secondary | ICD-10-CM | POA: Diagnosis not present

## 2019-10-18 DIAGNOSIS — I255 Ischemic cardiomyopathy: Secondary | ICD-10-CM | POA: Diagnosis present

## 2019-10-18 DIAGNOSIS — N186 End stage renal disease: Secondary | ICD-10-CM | POA: Diagnosis not present

## 2019-10-18 DIAGNOSIS — I2510 Atherosclerotic heart disease of native coronary artery without angina pectoris: Secondary | ICD-10-CM | POA: Diagnosis present

## 2019-10-18 DIAGNOSIS — K3189 Other diseases of stomach and duodenum: Secondary | ICD-10-CM | POA: Diagnosis present

## 2019-10-18 DIAGNOSIS — N400 Enlarged prostate without lower urinary tract symptoms: Secondary | ICD-10-CM | POA: Diagnosis present

## 2019-10-18 DIAGNOSIS — Z992 Dependence on renal dialysis: Secondary | ICD-10-CM

## 2019-10-18 DIAGNOSIS — D500 Iron deficiency anemia secondary to blood loss (chronic): Secondary | ICD-10-CM | POA: Diagnosis present

## 2019-10-18 DIAGNOSIS — K922 Gastrointestinal hemorrhage, unspecified: Secondary | ICD-10-CM

## 2019-10-18 DIAGNOSIS — L03116 Cellulitis of left lower limb: Secondary | ICD-10-CM

## 2019-10-18 DIAGNOSIS — L03119 Cellulitis of unspecified part of limb: Secondary | ICD-10-CM

## 2019-10-18 DIAGNOSIS — Z951 Presence of aortocoronary bypass graft: Secondary | ICD-10-CM

## 2019-10-18 DIAGNOSIS — E669 Obesity, unspecified: Secondary | ICD-10-CM | POA: Diagnosis present

## 2019-10-18 DIAGNOSIS — Z79899 Other long term (current) drug therapy: Secondary | ICD-10-CM

## 2019-10-18 DIAGNOSIS — Z7901 Long term (current) use of anticoagulants: Secondary | ICD-10-CM | POA: Diagnosis not present

## 2019-10-18 HISTORY — DX: Presence of automatic (implantable) cardiac defibrillator: Z95.810

## 2019-10-18 LAB — COMPREHENSIVE METABOLIC PANEL
ALT: 18 U/L (ref 0–44)
AST: 18 U/L (ref 15–41)
Albumin: 2.6 g/dL — ABNORMAL LOW (ref 3.5–5.0)
Alkaline Phosphatase: 157 U/L — ABNORMAL HIGH (ref 38–126)
Anion gap: 11 (ref 5–15)
BUN: 24 mg/dL — ABNORMAL HIGH (ref 8–23)
CO2: 30 mmol/L (ref 22–32)
Calcium: 8.5 mg/dL — ABNORMAL LOW (ref 8.9–10.3)
Chloride: 94 mmol/L — ABNORMAL LOW (ref 98–111)
Creatinine, Ser: 3.51 mg/dL — ABNORMAL HIGH (ref 0.61–1.24)
GFR calc Af Amer: 17 mL/min — ABNORMAL LOW (ref >60)
GFR calc non Af Amer: 15 mL/min — ABNORMAL LOW (ref >60)
Glucose, Bld: 100 mg/dL — ABNORMAL HIGH (ref 70–99)
Potassium: 3.7 mmol/L (ref 3.5–5.1)
Sodium: 135 mmol/L (ref 135–145)
Total Bilirubin: 1.5 mg/dL — ABNORMAL HIGH (ref 0.3–1.2)
Total Protein: 6.2 g/dL — ABNORMAL LOW (ref 6.5–8.1)

## 2019-10-18 LAB — CBC WITH DIFFERENTIAL/PLATELET
Abs Immature Granulocytes: 0.32 10*3/uL — ABNORMAL HIGH (ref 0.00–0.07)
Basophils Absolute: 0.1 10*3/uL (ref 0.0–0.1)
Basophils Relative: 1 %
Eosinophils Absolute: 0.2 10*3/uL (ref 0.0–0.5)
Eosinophils Relative: 2 %
HCT: 26.3 % — ABNORMAL LOW (ref 39.0–52.0)
Hemoglobin: 8.5 g/dL — ABNORMAL LOW (ref 13.0–17.0)
Immature Granulocytes: 4 %
Lymphocytes Relative: 11 %
Lymphs Abs: 1 10*3/uL (ref 0.7–4.0)
MCH: 31.1 pg (ref 26.0–34.0)
MCHC: 32.3 g/dL (ref 30.0–36.0)
MCV: 96.3 fL (ref 80.0–100.0)
Monocytes Absolute: 0.6 10*3/uL (ref 0.1–1.0)
Monocytes Relative: 8 %
Neutro Abs: 6.2 10*3/uL (ref 1.7–7.7)
Neutrophils Relative %: 74 %
Platelets: 106 10*3/uL — ABNORMAL LOW (ref 150–400)
RBC: 2.73 MIL/uL — ABNORMAL LOW (ref 4.22–5.81)
RDW: 20.2 % — ABNORMAL HIGH (ref 11.5–15.5)
WBC: 8.3 10*3/uL (ref 4.0–10.5)
nRBC: 0 % (ref 0.0–0.2)

## 2019-10-18 LAB — PROTIME-INR
INR: 1.8 — ABNORMAL HIGH (ref 0.8–1.2)
Prothrombin Time: 20.6 seconds — ABNORMAL HIGH (ref 11.4–15.2)

## 2019-10-18 LAB — LACTIC ACID, PLASMA
Lactic Acid, Venous: 2 mmol/L (ref 0.5–1.9)
Lactic Acid, Venous: 1.7 mmol/L (ref 0.5–1.9)

## 2019-10-18 LAB — POC OCCULT BLOOD, ED: Fecal Occult Bld: POSITIVE — AB

## 2019-10-18 LAB — SARS CORONAVIRUS 2 BY RT PCR (HOSPITAL ORDER, PERFORMED IN ~~LOC~~ HOSPITAL LAB): SARS Coronavirus 2: NEGATIVE

## 2019-10-18 MED ORDER — ALLOPURINOL 100 MG PO TABS
50.0000 mg | ORAL_TABLET | ORAL | Status: DC
  Administered 2019-10-19 – 2019-10-23 (×4): 50 mg via ORAL
  Filled 2019-10-18 (×4): qty 1

## 2019-10-18 MED ORDER — ACETAMINOPHEN 325 MG PO TABS
650.0000 mg | ORAL_TABLET | Freq: Once | ORAL | Status: AC
  Administered 2019-10-18: 650 mg via ORAL
  Filled 2019-10-18: qty 2

## 2019-10-18 MED ORDER — SEVELAMER CARBONATE 800 MG PO TABS
800.0000 mg | ORAL_TABLET | Freq: Three times a day (TID) | ORAL | Status: DC
  Administered 2019-10-19 – 2019-10-23 (×11): 800 mg via ORAL
  Filled 2019-10-18 (×13): qty 1

## 2019-10-18 MED ORDER — PANTOPRAZOLE SODIUM 40 MG IV SOLR
40.0000 mg | Freq: Two times a day (BID) | INTRAVENOUS | Status: DC
  Administered 2019-10-18 – 2019-10-22 (×8): 40 mg via INTRAVENOUS
  Filled 2019-10-18 (×8): qty 40

## 2019-10-18 MED ORDER — MIDODRINE HCL 5 MG PO TABS
15.0000 mg | ORAL_TABLET | Freq: Three times a day (TID) | ORAL | Status: DC
  Administered 2019-10-19 – 2019-10-23 (×14): 15 mg via ORAL
  Filled 2019-10-18 (×16): qty 3

## 2019-10-18 MED ORDER — TAMSULOSIN HCL 0.4 MG PO CAPS
0.4000 mg | ORAL_CAPSULE | Freq: Every day | ORAL | Status: DC
  Administered 2019-10-18 – 2019-10-23 (×6): 0.4 mg via ORAL
  Filled 2019-10-18 (×6): qty 1

## 2019-10-18 MED ORDER — CEFAZOLIN SODIUM-DEXTROSE 1-4 GM/50ML-% IV SOLN
1.0000 g | INTRAVENOUS | Status: DC
  Filled 2019-10-18: qty 50

## 2019-10-18 MED ORDER — L-METHYLFOLATE-B6-B12 3-35-2 MG PO TABS
1.0000 | ORAL_TABLET | Freq: Every day | ORAL | Status: DC
  Administered 2019-10-19 – 2019-10-23 (×5): 1 via ORAL
  Filled 2019-10-18 (×5): qty 1

## 2019-10-18 MED ORDER — CEFAZOLIN SODIUM-DEXTROSE 1-4 GM/50ML-% IV SOLN
1.0000 g | Freq: Once | INTRAVENOUS | Status: AC
  Administered 2019-10-18: 1 g via INTRAVENOUS
  Filled 2019-10-18: qty 50

## 2019-10-18 MED ORDER — ACETAMINOPHEN 500 MG PO TABS
1000.0000 mg | ORAL_TABLET | Freq: Four times a day (QID) | ORAL | Status: DC | PRN
  Administered 2019-10-18 – 2019-10-22 (×4): 1000 mg via ORAL
  Filled 2019-10-18 (×4): qty 2

## 2019-10-18 MED ORDER — PRAMIPEXOLE DIHYDROCHLORIDE 0.25 MG PO TABS
0.2500 mg | ORAL_TABLET | Freq: Two times a day (BID) | ORAL | Status: DC
  Administered 2019-10-18 – 2019-10-23 (×10): 0.25 mg via ORAL
  Filled 2019-10-18 (×12): qty 1

## 2019-10-18 MED ORDER — AMIODARONE HCL 200 MG PO TABS
200.0000 mg | ORAL_TABLET | Freq: Every day | ORAL | Status: DC
  Administered 2019-10-19 – 2019-10-23 (×4): 200 mg via ORAL
  Filled 2019-10-18 (×4): qty 1

## 2019-10-18 MED ORDER — ROSUVASTATIN CALCIUM 5 MG PO TABS
10.0000 mg | ORAL_TABLET | Freq: Every day | ORAL | Status: DC
  Administered 2019-10-18 – 2019-10-23 (×6): 10 mg via ORAL
  Filled 2019-10-18 (×6): qty 2

## 2019-10-18 MED ORDER — SODIUM CHLORIDE 0.9 % IV BOLUS
250.0000 mL | Freq: Once | INTRAVENOUS | Status: AC
  Administered 2019-10-18: 250 mL via INTRAVENOUS

## 2019-10-18 MED ORDER — RENA-VITE PO TABS
1.0000 | ORAL_TABLET | Freq: Every day | ORAL | Status: DC
  Administered 2019-10-18 – 2019-10-23 (×5): 1 via ORAL
  Filled 2019-10-18 (×6): qty 1

## 2019-10-18 NOTE — ED Provider Notes (Signed)
[facility] EMERGENCY DEPARTMENT Provider Note   CSN: [id number] Arrival date & time: 10/18/19  1216     History Chief Complaint  Patient presents with  . Cellulitis    Ricardo Payne is a 86 y.o. male with a past medical history significant for systolic and diastolic congestive heart failure, CAD, end-stage renal disease on dialysis Monday, Wednesday, Friday, and Saturday with last dialysis yesterday, paroxysmal atrial fibrillation, hyperlipidemia, ICD in place, hypertension, and history of CVA who presents to the ED due to low blood pressure and left lower extremity cellulitis.  Patient notes his nurse took his blood pressure earlier today which read 80/40. He went to dialysis yesterday which was a full session. Denies chest pain and shortness of breath. He is currently being treated for LLE cellulites that has been present for the past 4 weeks. He has been on an oral doxycycline since last Friday. Denies worsening of symptoms. Denies fever and chills. He sees Dr. Dayna Ramus at [facility] who is monitoring his cellulites. No aggravating or alleviating factors. Denies syncopal episodes.   History obtained from patient, son, and past medical records. No interpreter used during encounter.      Past Medical History:  Diagnosis Date  . Acquired thrombophilia ([place])   . Age-related cataract of both eyes   . ASHD (arteriosclerotic heart disease)   . Benign prostatic hyperplasia   . CAD (coronary artery disease)    a. s/p CABG x1 in 1990  . Cerebellar stroke ([place]) 07/02/2016  . Chronic combined systolic and diastolic CHF (congestive heart failure) ([place])   . Chronic gout without tophus   . CKD (chronic kidney disease), stage IV ([place])   . CVA (cerebral vascular accident) ([place]) 2018  . Essential hypertension   . ICD (implantable cardioverter-defibrillator) in place   . Ischemic cardiomyopathy   . Mixed hyperlipidemia   . PAF (paroxysmal atrial fibrillation) ([place])   . Restless  legs   . Secondary pulmonary arterial hypertension ([place])   . Thrombocytopenia ([place])   . Vitamin D deficiency     Patient Active Problem List   Diagnosis Date Noted  . Hypotension 10/18/2019  . GI bleed 10/18/2019  . ESRD on dialysis ([place]) 10/18/2019  . Cellulitis of leg 10/18/2019  . Dyspnea   . Acute decompensated heart failure ([place]) 08/16/2019  . Heart failure with reduced ejection fraction ([place]) 07/19/2019  . Congestive heart failure ([place]) 07/18/2019  . Palliative care by specialist   . Goals of care, counseling/discussion   . Cirrhosis ([place])   . CKD (chronic kidney disease) 06/25/2019  . Head trauma 06/25/2019  . Blood thinned due to long-term anticoagulant use 06/25/2019  . Anemia 06/25/2019  . Acute renal failure superimposed on chronic kidney disease ([place]) 05/23/2019  . Chronic combined systolic and diastolic CHF (congestive heart failure) ([place]) 05/23/2019  . Esophageal hypertension 05/23/2019  . Thrombocytopenia ([place]) 05/23/2019  . Acute on chronic HFrEF (heart failure with reduced ejection fraction) ([place]) 05/22/2019  . ICD (implantable cardioverter-defibrillator) in place 04/18/2019    Past Surgical History:  Procedure Laterality Date  . CARDIOVERSION N/A 08/22/2019   Procedure: CARDIOVERSION;  Surgeon: Larey Dresser, MD;  Location: [facility] ENDOSCOPY;  Service: Cardiovascular;  Laterality: N/A;  . CHOLECYSTECTOMY    . CORONARY ARTERY BYPASS GRAFT    . IR FLUORO GUIDE CV LINE RIGHT  08/23/2019  . IR US GUIDE VASC ACCESS RIGHT  08/23/2019  . PACEMAKER IMPLANT  10/2018  . TEE WITHOUT CARDIOVERSION N/A 08/22/2019  Procedure: TRANSESOPHAGEAL ECHOCARDIOGRAM (TEE);  Surgeon: Larey Dresser, MD;  Location[facility] ENDOSCOPY;  Service: Cardiovascular;  Laterality: N/A;       Family History  Problem Relation Age of Onset  . Hypertension Mother   . Suicidality Mother   . Valvular heart disease Father   . Heart disease Father   . Heart attack Brother     Social History    Tobacco Use  . Smoking status: Former Smoker    Quit date: 1988    Years since quitting: 33.4  . Smokeless tobacco: Never Used  Substance Use Topics  . Alcohol use: Yes    Comment: rare  . Drug use: Never    Home Medications Prior to Admission medications   Medication Sig Start Date End Date Taking? Authorizing Provider  acetaminophen (TYLENOL) 500 MG tablet Take 1,000 mg by mouth every 6 (six) hours as needed for mild pain.   Yes [provider]  allopurinol (ZYLOPRIM) 100 MG tablet Take 0.5 tablets (50 mg total) by mouth every other day. 07/12/19  Yes [name], MD  amiodarone (PACERONE) 200 MG tablet Take 1 tablet (200 mg total) by mouth daily. 09/11/19  Yes Clegg, Amy D, NP  apixaban (ELIQUIS) 5 MG TABS tablet Take 1 tablet (5 mg total) by mouth 2 (two) times daily. 09/24/19  Yes Larey Dresser, MD  docusate sodium (COLACE) 50 MG capsule Take 50 mg by mouth daily.   Yes [provider]  doxycycline (VIBRA-TABS) 100 MG tablet Take 100 mg by mouth 2 (two) times daily. 10/18/19  Yes [provider]  L-Methylfolate-B6-B12 (FOLTANX) 3-35-2 MG TABS Take 1 tablet by mouth daily. 09/11/19  Yes O'Neal, Cassie Freer, MD  midodrine (PROAMATINE) 10 MG tablet Take 15 mg by mouth 3 (three) times daily.   Yes [provider]  multivitamin (RENA-VIT) TABS tablet Take 1 tablet by mouth at bedtime. 09/11/19  Yes Clegg, Amy D, NP  pramipexole (MIRAPEX) 0.25 MG tablet Take 0.25 mg by mouth 2 (two) times daily.   Yes [provider]  rosuvastatin (CRESTOR) 10 MG tablet Take 1 tablet (10 mg total) by mouth daily. 09/24/19 09/18/20 Yes Larey Dresser, MD  sevelamer (RENAGEL) 800 MG tablet Take 800 mg by mouth 3 (three) times daily with meals.   Yes [provider]  tamsulosin (FLOMAX) 0.4 MG CAPS capsule Take 1 capsule (0.4 mg total) by mouth daily. 07/21/19  Yes [name], MD  predniSONE (DELTASONE) 20 MG tablet Take 4 tablets (80 mg total)  by mouth daily with breakfast. Patient not taking: Reported on 10/18/2019 09/11/19   Conrad Spencer, NP    Allergies    Patient has no known allergies.  Review of Systems   Review of Systems  Constitutional: Negative for chills and fever.  Respiratory: Negative for shortness of breath.   Cardiovascular: Positive for leg swelling. Negative for chest pain.  Gastrointestinal: Negative for abdominal pain, diarrhea, nausea and vomiting.  Musculoskeletal: Positive for arthralgias (LLE).  Skin: Positive for color change.  All other systems reviewed and are negative.   Physical Exam Updated Vital Signs BP (!) 92/43   Pulse 76   Temp 98.4 F (36.9 C)   Resp (!) 22   Ht 5\' 6"  (1.676 m)   Wt 93 kg   SpO2 95%   BMI 33.09 kg/m   Physical Exam Vitals and nursing note reviewed.  Constitutional:      General: He is not in acute distress.  Appearance: He is not toxic-appearing.  HENT:     Head: Normocephalic.  Eyes:     Pupils: Pupils are equal, round, and reactive to light.  Cardiovascular:     Rate and Rhythm: Normal rate and regular rhythm.     Pulses: Normal pulses.     Heart sounds: Normal heart sounds. No murmur. No friction rub. No gallop.   Pulmonary:     Effort: Pulmonary effort is normal.     Breath sounds: Normal breath sounds.  Abdominal:     General: Abdomen is flat. Bowel sounds are normal. There is no distension.     Palpations: Abdomen is soft.     Tenderness: There is no abdominal tenderness. There is no guarding or rebound.  Genitourinary:    Comments: Decubitus pressure sore.  Musculoskeletal:     Cervical back: Neck supple.     Right lower leg: Edema present.     Left lower leg: Edema present.     Comments: 2+ pitting edema bilaterally. Erythema surrounding bilateral lower extremities (L>R). See photo below. Distal pulses and sensation intact.   Skin:    General: Skin is warm and dry.  Neurological:     General: No focal deficit present.     Mental  Status: He is alert.  Psychiatric:        Mood and Affect: Mood normal.        Behavior: Behavior normal.       ED Results / Procedures / Treatments   Labs (all labs ordered are listed, but only abnormal results are displayed) Labs Reviewed  COMPREHENSIVE METABOLIC PANEL - Abnormal; Notable for the following components:      Result Value   Chloride 94 (*)    Glucose, Bld 100 (*)    BUN 24 (*)    Creatinine, Ser 3.51 (*)    Calcium 8.5 (*)    Total Protein 6.2 (*)    Albumin 2.6 (*)    Alkaline Phosphatase 157 (*)    Total Bilirubin 1.5 (*)    GFR calc non Af Amer 15 (*)    GFR calc Af Amer 17 (*)    All other components within normal limits  LACTIC ACID, PLASMA - Abnormal; Notable for the following components:   Lactic Acid, Venous 2.0 (*)    All other components within normal limits  CBC WITH DIFFERENTIAL/PLATELET - Abnormal; Notable for the following components:   RBC 2.73 (*)    Hemoglobin 8.5 (*)    HCT 26.3 (*)    RDW 20.2 (*)    Platelets 106 (*)    Abs Immature Granulocytes 0.32 (*)    All other components within normal limits  PROTIME-INR - Abnormal; Notable for the following components:   Prothrombin Time 20.6 (*)    INR 1.8 (*)    All other components within normal limits  POC OCCULT BLOOD, ED - Abnormal; Notable for the following components:   Fecal Occult Bld POSITIVE (*)    All other components within normal limits  SARS CORONAVIRUS 2 BY RT PCR (HOSPITAL ORDER, [facility])  CULTURE, BLOOD (ROUTINE X 2)  CULTURE, BLOOD (ROUTINE X 2)  LACTIC ACID, PLASMA  URINALYSIS, ROUTINE W REFLEX MICROSCOPIC  CBC  COMPREHENSIVE METABOLIC PANEL    EKG EKG Interpretation  Date/Time:  Thursday Oct 18 2019 12:14:59 EDT Ventricular Rate:  77 PR Interval:    QRS Duration: 146 QT Interval:  472 QTC Calculation: 534 R Axis:   -  35 Text Interpretation: Sinus rhythm Left axis deviation Non-specific intra-ventricular conduction block  Abnormal ECG Confirmed by Quintella Reichert [phone]) on 10/18/2019 12:29:14 PM   Radiology DG Chest Portable 1 View  Result Date: 10/18/2019 CLINICAL DATA:  Fluid overload EXAM: PORTABLE CHEST 1 VIEW COMPARISON:  Chest radiograph dated 09/01/2019 FINDINGS: A right internal jugular central venous catheter tip overlies the superior cavoatrial junction. A left subclavian approach cardiac device is redemonstrated. Median sternotomy wires are seen. The heart remains enlarged. Vascular calcifications are seen in the aortic arch. Both lungs are clear. The visualized skeletal structures are unremarkable. IMPRESSION: 1. No acute cardiopulmonary process. 2. Cardiomegaly. Electronically Signed   By: Zerita Boers M.D.   On: 10/18/2019 17:59   CUP PACEART REMOTE DEVICE CHECK  Result Date: 10/17/2019 Scheduled remote reviewed. Normal device function.  Next remote 91 days. Felisa Bonier, RN, MSN  VAS [place] LOWER EXTREMITY VENOUS (DVT) ([facility] and [facility] 7a-7p)  Result Date: 10/18/2019  Lower Venous DVTStudy Indications: Edema.  Limitations: Body habitus and poor ultrasound/tissue interface. Comparison Study: 07/06/19 Performing Technologist: Abram Sander RVS  Examination Guidelines: A complete evaluation includes B-mode imaging, spectral Doppler, color Doppler, and power Doppler as needed of all accessible portions of each vessel. Bilateral testing is considered an integral part of a complete examination. Limited examinations for reoccurring indications may be performed as noted. The reflux portion of the exam is performed with the patient in reverse Trendelenburg.  +---------+---------------+---------+-----------+----------+------------------+ RIGHT    CompressibilityPhasicitySpontaneityPropertiesThrombus Aging     +---------+---------------+---------+-----------+----------+------------------+ CFV      Full           Yes      Yes                                      +---------+---------------+---------+-----------+----------+------------------+ SFJ      Full                                                            +---------+---------------+---------+-----------+----------+------------------+ FV Prox  Full                                                            +---------+---------------+---------+-----------+----------+------------------+ FV Mid   Full                                                            +---------+---------------+---------+-----------+----------+------------------+ FV DistalFull                                                            +---------+---------------+---------+-----------+----------+------------------+ PFV      Full                                                            +---------+---------------+---------+-----------+----------+------------------+  POP      Full           Yes      Yes                                     +---------+---------------+---------+-----------+----------+------------------+ PTV      Full                                         limited                                                                  visualization      +---------+---------------+---------+-----------+----------+------------------+ PERO                                                  Not visualized     +---------+---------------+---------+-----------+----------+------------------+   +---------+---------------+---------+-----------+----------+--------------+ LEFT     CompressibilityPhasicitySpontaneityPropertiesThrombus Aging +---------+---------------+---------+-----------+----------+--------------+ CFV      Full           Yes      Yes                                 +---------+---------------+---------+-----------+----------+--------------+ SFJ      Full                                                         +---------+---------------+---------+-----------+----------+--------------+ FV Prox  Full                                                        +---------+---------------+---------+-----------+----------+--------------+ FV Mid   Full                                                        +---------+---------------+---------+-----------+----------+--------------+ FV Distal               Yes      Yes                                 +---------+---------------+---------+-----------+----------+--------------+ PFV      Full                                                        +---------+---------------+---------+-----------+----------+--------------+  POP                     Yes      Yes                                 +---------+---------------+---------+-----------+----------+--------------+ PTV      Full                                                        +---------+---------------+---------+-----------+----------+--------------+ PERO                                                  Not visualized +---------+---------------+---------+-----------+----------+--------------+     Summary: BILATERAL: - No evidence of deep vein thrombosis seen in the lower extremities, bilaterally. -   *See table(s) above for measurements and observations. Electronically signed by Monica Martinez MD on 10/18/2019 at 5:36:01 PM.    Final     Procedures Procedures (including critical care time)  Medications Ordered in ED Medications  ceFAZolin (ANCEF) IVPB 1 g/50 mL premix (has no administration in time range)  acetaminophen (TYLENOL) tablet 1,000 mg (has no administration in time range)  allopurinol (ZYLOPRIM) tablet 50 mg (has no administration in time range)  amiodarone (PACERONE) tablet 200 mg (has no administration in time range)  midodrine (PROAMATINE) tablet 15 mg (has no administration in time range)  rosuvastatin (CRESTOR) tablet 10 mg (10 mg Oral Given 10/18/19 1947)    sevelamer carbonate (RENVELA) tablet 800 mg (has no administration in time range)  tamsulosin (FLOMAX) capsule 0.4 mg (0.4 mg Oral Given 10/18/19 1947)  pramipexole (MIRAPEX) tablet 0.25 mg (has no administration in time range)  l-methylfolate-B6-B12 (METANX) 3-35-2 MG per tablet 1 tablet (has no administration in time range)  multivitamin (RENA-VIT) tablet 1 tablet (has no administration in time range)  pantoprazole (PROTONIX) injection 40 mg (has no administration in time range)  ceFAZolin (ANCEF) IVPB 1 g/50 mL premix (0 g Intravenous Stopped 10/18/19 1735)  acetaminophen (TYLENOL) tablet 650 mg (650 mg Oral Given 10/18/19 1552)  sodium chloride 0.9 % bolus 250 mL (250 mLs Intravenous New Bag/Given 10/18/19 1949)    ED Course  I have reviewed the triage vital signs and the nursing notes.  Pertinent labs & imaging results that were available during my care of the patient were reviewed by me and considered in my medical decision making (see chart for details).  Clinical Course as of Oct 18 2031  Thu Oct 18, 2019  1330 Lactic Acid, Venous(!!): 2.0 [CA]  1330 BP(!): 120/93 [CA]  1715 Fecal Occult Blood, POC(!): POSITIVE [CA]  1743 Spoke to Dr. Barth Kirks with [facility] who agrees to admit patient for further treatment.    [CA]    Clinical Course User Index [CA] Suzy Bouchard, PA-C   MDM Rules/Calculators/A&P                     86 year old male presents the ED due to hypotension and left lower extremity cellulitis.  Patient notes a nurse took his blood pressure earlier today which was extremely low.  Patient has a history of  ESRD on dialysis Monday Wednesday Friday and Saturday and congestive heart failure.  Currently being treated for cellulitis for the past 6 days on doxycycline.  Upon arrival, patient hypotensive at 89/41 with otherwise normal vitals. Patient in no acute distress and non-toxic appearing. Physical exam significant for erythema of bilateral lower extremities (L>R). 2+ pitting  edema. Unsure if lower extremity erythema is cellulitis vs. Venous stasis. Will obtain routine labs and [place] to rule out DVT. IV antibiotic given here in the ED due to concerns of cellulitis.   CBC significant for hemoglobin 8.5 which is almost 2 points lower than baseline and 9 days ago. Fecal occult positive.  CMP significant for creatinine at 3.51 and BUN at 24 which appears to be slightly worse than baseline. Initial lactic acid elevated at 2.0 with improvement to 1.7. Will hold IVFs given patient is fluid overloaded on exam. [place] personally reviewed which is negative for bilateral DVT.  Will consult hospital given drop in hemoglobin and positive fecal occult for medication admission. Discussed case with Dr. Barth Kirks with [facility] who agrees to admit patient for further evaluation. COVID test pending.  Final Clinical Impression(s) / ED Diagnoses Final diagnoses:  Hypotension, unspecified hypotension type    Rx / DC Orders ED Discharge Orders    None       Karie Kirks 10/18/19 2035    Quintella Reichert, MD 10/19/19 1213

## 2019-10-18 NOTE — Progress Notes (Signed)
Lower extremity venous has been completed.   Preliminary results in CV Proc.   Abram Sander 10/18/2019 3:35 PM

## 2019-10-18 NOTE — Progress Notes (Signed)
Pharmacy Antibiotic Note  Ricardo Payne is a 86 y.o. male admitted on 10/18/2019 with cellulitis.  Pharmacy has been consulted for cefazolin dosing. Patient is ESRD on HD-MWFSat.  Plan: Cefazolin 1 g IV every 24 hours Monitor clinical status Follow-up culture reports Establish length of therapy  Height: 5\' 6"  (167.6 cm) Weight: 93 kg (205 lb) IBW/kg (Calculated) : 63.8  Temp (24hrs), Avg:98.4 F (36.9 C), Min:98.4 F (36.9 C), Max:98.4 F (36.9 C)  Recent Labs  Lab 10/18/19 1249 10/18/19 1504  WBC 8.3  --   CREATININE 3.51*  --   LATICACIDVEN 2.0* 1.7    Estimated Creatinine Clearance: 15.8 mL/min (A) (by C-G formula based on SCr of 3.51 mg/dL (H)).    No Known Allergies  Antimicrobials this admission: Cefazolin 5/20 >>  Microbiology results: 5/20 BCx x2: sent 5/20 COVID: sent     Philicia Heyne L. Devin Going, [place] PGY1 Pharmacy Resident [phone] 10/18/19      5:56 PM  Please check AMION for all [place] phone numbers After 10:00 PM, call the [place] [phone]

## 2019-10-18 NOTE — ED Notes (Signed)
Ask patient for urine sample, patient stated he did not need to urinate at this time.  

## 2019-10-18 NOTE — H&P (Signed)
History and Physical    Ricardo Payne DOB: [birth year] DOA: 10/18/2019  PCP: [name], FNP   Patient coming from: Home  I have personally briefly reviewed patient's old medical records in [place]  Chief Complaint: Hypotension, fatigue, lower extremity cellulitis  HPI: Ricardo Payne is a 86 y.o. male with medical history significant of systolic and diastolic congestive failure, coronary disease, end-stage renal disease on dialysis Monday, Wednesday, Friday, Saturday who was dialyzed yesterday.  He also has paroxysmal atrial fibrillation, ICD, hyperlipidemia, hypertension, history of CVA.  He presented to the emergency room because of worsening bilateral lower extremity swelling, erythema and pain of the left lower extremity which he says has been ongoing for the past couple of days with recent worsening.  He also says he has been feeling more fatigued lately and also his blood pressure has been lower than usual.  He denies having any fevers, chills, nausea, vomiting, abdominal pain, diarrhea.  Patient says he was started on doxycycline by his primary care physician which he took for maybe 2 days but he says it is not helping his lower extremity pain and cellulitis.  He is on Eliquis for his paroxysmal atrial fibrillation.  ED Course: Lab work in the ED showed hemoglobin 8.5.  His baseline is usually between 10 and 11.  His stool for occult blood was positive per the ER.  He was found to be hypotensive with blood pressure 90/47.  Review of Systems: As per HPI otherwise all other systems reviewed and are negative.   Past Medical History:  Diagnosis Date  . Acquired thrombophilia ([place])   . Age-related cataract of both eyes   . ASHD (arteriosclerotic heart disease)   . Benign prostatic hyperplasia   . CAD (coronary artery disease)    a. s/p CABG x1 in 1990  . Cerebellar stroke ([place]) 07/02/2016  . Chronic combined systolic and diastolic CHF (congestive  heart failure) ([place])   . Chronic gout without tophus   . CKD (chronic kidney disease), stage IV ([place])   . CVA (cerebral vascular accident) ([place]) 2018  . Essential hypertension   . ICD (implantable cardioverter-defibrillator) in place   . Ischemic cardiomyopathy   . Mixed hyperlipidemia   . PAF (paroxysmal atrial fibrillation) ([place])   . Restless legs   . Secondary pulmonary arterial hypertension ([place])   . Thrombocytopenia ([place])   . Vitamin D deficiency     Past Surgical History:  Procedure Laterality Date  . CARDIOVERSION N/A 08/22/2019   Procedure: CARDIOVERSION;  Surgeon: Larey Dresser, MD;  Location: [facility] ENDOSCOPY;  Service: Cardiovascular;  Laterality: N/A;  . CHOLECYSTECTOMY    . CORONARY ARTERY BYPASS GRAFT    . IR FLUORO GUIDE CV LINE RIGHT  08/23/2019  . IR US GUIDE VASC ACCESS RIGHT  08/23/2019  . PACEMAKER IMPLANT  10/2018  . TEE WITHOUT CARDIOVERSION N/A 08/22/2019   Procedure: TRANSESOPHAGEAL ECHOCARDIOGRAM (TEE);  Surgeon: Larey Dresser, MD;  Location: [facility] ENDOSCOPY;  Service: Cardiovascular;  Laterality: N/A;    Social History  reports that he quit smoking about 33 years ago. He has never used smokeless tobacco. He reports current alcohol use. He reports that he does not use drugs.  No Known Allergies  Family History  Problem Relation Age of Onset  . Hypertension Mother   . Suicidality Mother   . Valvular heart disease Father   . Heart disease Father   . Heart attack Brother    Prior to Admission  medications   Medication Sig Start Date End Date Taking? Authorizing Provider  acetaminophen (TYLENOL) 500 MG tablet Take 1,000 mg by mouth every 6 (six) hours as needed for mild pain.   Yes [provider]  allopurinol (ZYLOPRIM) 100 MG tablet Take 0.5 tablets (50 mg total) by mouth every other day. 07/12/19  Yes [name], MD  amiodarone (PACERONE) 200 MG tablet Take 1 tablet (200 mg total) by mouth daily. 09/11/19  Yes Clegg, Amy D, NP  apixaban  (ELIQUIS) 5 MG TABS tablet Take 1 tablet (5 mg total) by mouth 2 (two) times daily. 09/24/19  Yes Larey Dresser, MD  docusate sodium (COLACE) 50 MG capsule Take 50 mg by mouth daily.   Yes [provider]  doxycycline (VIBRA-TABS) 100 MG tablet Take 100 mg by mouth 2 (two) times daily. 10/18/19  Yes [provider]  L-Methylfolate-B6-B12 (FOLTANX) 3-35-2 MG TABS Take 1 tablet by mouth daily. 09/11/19  Yes O'Neal, Cassie Freer, MD  midodrine (PROAMATINE) 10 MG tablet Take 15 mg by mouth 3 (three) times daily.   Yes [provider]  multivitamin (RENA-VIT) TABS tablet Take 1 tablet by mouth at bedtime. 09/11/19  Yes Clegg, Amy D, NP  pramipexole (MIRAPEX) 0.25 MG tablet Take 0.25 mg by mouth 2 (two) times daily.   Yes [provider]  rosuvastatin (CRESTOR) 10 MG tablet Take 1 tablet (10 mg total) by mouth daily. 09/24/19 09/18/20 Yes Larey Dresser, MD  sevelamer (RENAGEL) 800 MG tablet Take 800 mg by mouth 3 (three) times daily with meals.   Yes [provider]  tamsulosin (FLOMAX) 0.4 MG CAPS capsule Take 1 capsule (0.4 mg total) by mouth daily. 07/21/19  Yes [name], MD  predniSONE (DELTASONE) 20 MG tablet Take 4 tablets (80 mg total) by mouth daily with breakfast. Patient not taking: Reported on 10/18/2019 09/11/19   Conrad [place], NP    Physical Exam: Vitals:   10/18/19 1630 10/18/19 1645 10/18/19 1700 10/18/19 1715  BP: (!) 95/54 (!) 96/45 (!) 89/44 (!) 90/47  Pulse: 75 72 74 72  Resp: 17 (!) 21 20 13   Temp:      SpO2: 95% 95% 100% 97%  Weight:      Height:        Constitutional: NAD, calm, comfortable Vitals:   10/18/19 1630 10/18/19 1645 10/18/19 1700 10/18/19 1715  BP: (!) 95/54 (!) 96/45 (!) 89/44 (!) 90/47  Pulse: 75 72 74 72  Resp: 17 (!) 21 20 13   Temp:      SpO2: 95% 95% 100% 97%  Weight:      Height:       Eyes: PERRL, lids and conjunctivae normal ENMT: Mucous membranes are moist.  Neck: normal, supple, no  masses Respiratory: clear to auscultation bilaterally, no wheezing, no crackles. Normal respiratory effort. No accessory muscle use.  Cardiovascular: Regular rate and rhythm, no murmurs / rubs / gallops.  Abdomen: no tenderness, no masses palpated. No hepatosplenomegaly. Bowel sounds positive.  Musculoskeletal: No joint deformity upper and lower extremities. Good ROM, no contractures. Normal muscle tone.  Bilateral lower extremity edema with erythema, worse on the left lower extremity, he has a few scabbed lesions without any open wounds, has some tenderness to palpation especially on the left lower extremity. Skin: Bilateral lower extremity erythema, worse in the left lower extremity Neurologic: CN 2-12 grossly intact. Sensation intact, DTR normal. Strength 5/5 in all 4.  Psychiatric: Normal judgment and insight. Alert and oriented x  3. Normal mood.   Labs on Admission: I have personally reviewed following labs and imaging studies  CBC: Recent Labs  Lab 10/18/19 1249  WBC 8.3  NEUTROABS 6.2  HGB 8.5*  HCT 26.3*  MCV 96.3  PLT 106*    Basic Metabolic Panel: Recent Labs  Lab 10/18/19 1249  NA 135  K 3.7  CL 94*  CO2 30  GLUCOSE 100*  BUN 24*  CREATININE 3.51*  CALCIUM 8.5*    GFR: Estimated Creatinine Clearance: 15.8 mL/min (A) (by C-G formula based on SCr of 3.51 mg/dL (H)).  Liver Function Tests: Recent Labs  Lab 10/18/19 1249  AST 18  ALT 18  ALKPHOS 157*  BILITOT 1.5*  PROT 6.2*  ALBUMIN 2.6*    Urine analysis:    Component Value Date/Time   COLORURINE YELLOW 08/20/2019 1608   APPEARANCEUR CLEAR 08/20/2019 1608   LABSPEC 1.010 08/20/2019 1608   [place] 5.0 08/20/2019 1608   GLUCOSEU NEGATIVE 08/20/2019 1608   [place] NEGATIVE 08/20/2019 1608   BILIRUBINUR NEGATIVE 08/20/2019 1608   KETONESUR NEGATIVE 08/20/2019 1608   PROTEINUR NEGATIVE 08/20/2019 1608   NITRITE NEGATIVE 08/20/2019 1608   LEUKOCYTESUR NEGATIVE 08/20/2019 1608    Radiological Exams  on Admission: DG Chest Portable 1 View  Result Date: 10/18/2019 CLINICAL DATA:  Fluid overload EXAM: PORTABLE CHEST 1 VIEW COMPARISON:  Chest radiograph dated 09/01/2019 FINDINGS: A right internal jugular central venous catheter tip overlies the superior cavoatrial junction. A left subclavian approach cardiac device is redemonstrated. Median sternotomy wires are seen. The heart remains enlarged. Vascular calcifications are seen in the aortic arch. Both lungs are clear. The visualized skeletal structures are unremarkable. IMPRESSION: 1. No acute cardiopulmonary process. 2. Cardiomegaly. Electronically Signed   By: Zerita Boers M.D.   On: 10/18/2019 17:59   CUP PACEART REMOTE DEVICE CHECK  Result Date: 10/17/2019 Scheduled remote reviewed. Normal device function.  Next remote 91 days. Felisa Bonier, RN, MSN  VAS [place] LOWER EXTREMITY VENOUS (DVT) (MC and WL 7a-7p)  Result Date: 10/18/2019  Lower Venous DVTStudy Indications: Edema.  Limitations: Body habitus and poor ultrasound/tissue interface. Comparison Study: 07/06/19 Performing Technologist: Abram Sander RVS  Examination Guidelines: A complete evaluation includes B-mode imaging, spectral Doppler, color Doppler, and power Doppler as needed of all accessible portions of each vessel. Bilateral testing is considered an integral part of a complete examination. Limited examinations for reoccurring indications may be performed as noted. The reflux portion of the exam is performed with the patient in reverse Trendelenburg.  +---------+---------------+---------+-----------+----------+------------------+ RIGHT    CompressibilityPhasicitySpontaneityPropertiesThrombus Aging     +---------+---------------+---------+-----------+----------+------------------+ CFV      Full           Yes      Yes                                     +---------+---------------+---------+-----------+----------+------------------+ SFJ      Full                                                             +---------+---------------+---------+-----------+----------+------------------+ FV Prox  Full                                                            +---------+---------------+---------+-----------+----------+------------------+  FV Mid   Full                                                            +---------+---------------+---------+-----------+----------+------------------+ FV DistalFull                                                            +---------+---------------+---------+-----------+----------+------------------+ PFV      Full                                                            +---------+---------------+---------+-----------+----------+------------------+ POP      Full           Yes      Yes                                     +---------+---------------+---------+-----------+----------+------------------+ PTV      Full                                         limited                                                                  visualization      +---------+---------------+---------+-----------+----------+------------------+ PERO                                                  Not visualized     +---------+---------------+---------+-----------+----------+------------------+   +---------+---------------+---------+-----------+----------+--------------+ LEFT     CompressibilityPhasicitySpontaneityPropertiesThrombus Aging +---------+---------------+---------+-----------+----------+--------------+ CFV      Full           Yes      Yes                                 +---------+---------------+---------+-----------+----------+--------------+ SFJ      Full                                                        +---------+---------------+---------+-----------+----------+--------------+ FV Prox  Full                                                         +---------+---------------+---------+-----------+----------+--------------+  FV Mid   Full                                                        +---------+---------------+---------+-----------+----------+--------------+ FV Distal               Yes      Yes                                 +---------+---------------+---------+-----------+----------+--------------+ PFV      Full                                                        +---------+---------------+---------+-----------+----------+--------------+ POP                     Yes      Yes                                 +---------+---------------+---------+-----------+----------+--------------+ PTV      Full                                                        +---------+---------------+---------+-----------+----------+--------------+ PERO                                                  Not visualized +---------+---------------+---------+-----------+----------+--------------+     Summary: BILATERAL: - No evidence of deep vein thrombosis seen in the lower extremities, bilaterally. -   *See table(s) above for measurements and observations. Electronically signed by Monica Martinez MD on 10/18/2019 at 5:36:01 PM.    Final     Assessment/Plan Principal Problem:   Hypotension Active Problems:   Cellulitis of leg   GI bleed   ESRD on dialysis ([place])   Chronic combined systolic and diastolic CHF (congestive heart failure) (HCC)   Thrombocytopenia (HCC)   Blood thinned due to long-term anticoagulant use  Hypotension: Patient has chronic hypotension but now with worsening likely secondary to GI bleed, cellulitis.  His hemoglobin dropped to 8.5 in the past week and he stool for occult blood was positive.  He is ESRD on dialysis with bilateral lower extremity edema.  Therefore reluctant to start him on continuous fluids.  Discussed with Dr. Reeves Dam from nephrology and will give him a 250 cc bolus.  Continue to  monitor blood pressure closely.  Also monitor hemoglobin.  Cellulitis of lower extremity: Patient has chronic bilateral lower extremity edema, also recently has been having erythema worse in his left lower extremity.  He has tenderness to palpation of his left lower extremity with increased warmth.  No open lesions noted at this time.  He says he was prescribed doxycycline which he took maybe for a day or so but not helping and  keeps worsening.  I will start him on IV Ancef.  Continue to monitor closely.  Denies having any fevers or chills.  GI bleed: Patient was found to have dropped his hemoglobin from 10.2-8.5 in the past week or so.  He is on chronic anticoagulation with Eliquis due to paroxysmal atrial fibrillation.  Will hold the Eliquis at this time.  [facility] gastroenterology.  Continue to monitor hemoglobin.  Consider transfusion for hemoglobin less than 7.  Stool for occult blood in the ED was positive per the ED provider.  We will start him on IV PPI twice daily.  He denies having any abdominal pain, nausea or vomiting at this time.  ESRD on hemodialysis: Consulted Dr. Royce Macadamia from nephrology.  Patient gets dialyzed Monday, Wednesday, Friday, Saturday.  Ordered his home medications.  He has bilateral lower extremity edema but respiratory status appears to be stable at this time.  Chest x-ray also appears to be stable.  Chronic combined systolic/diastolic and heart failure: Order home medications.  Continue to monitor.  Thrombocytopenia: Likely has chronic thrombocytopenia?  Etiology unclear.  Continue to monitor platelets.  DVT prophylaxis: SCD's given GI bleed Code Status:   Partial code Family Communication: Discussed previously with son at bedside Disposition Plan:   Patient is from:  Home  Anticipated DC to:  Home  Anticipated DC date:  2-3 days  Anticipated DC barriers: Pending further work-up for GI bleed  Consults called:  [facility] gastroenterology, Dr. Royce Macadamia  nephrology Admission status:  Inpatient  Severity of Illness: The appropriate patient status for this patient is INPATIENT. Inpatient status is judged to be reasonable and necessary in order to provide the required intensity of service to ensure the patient's safety. The patient's presenting symptoms, physical exam findings, and initial radiographic and laboratory data in the context of their chronic comorbidities is felt to place them at high risk for further clinical deterioration. Furthermore, it is not anticipated that the patient will be medically stable for discharge from the hospital within 2 midnights of admission. The following factors support the patient status of inpatient: Hypotension, GI bleed, lower extremity cellulitis, ESRD on dialysis  I certify that at the point of admission it is my clinical judgment that the patient will require inpatient hospital care spanning beyond 2 midnights from the point of admission due to high intensity of service, high risk for further deterioration and high frequency of surveillance required.     Yaakov Guthrie MD [facility]  How to contact the [facility] Attending or Consulting provider [place] or covering provider during after hours [place], for this patient?   1. Check the care team in [facility] and look for a) attending/consulting [facility] provider listed and b) the [facility] team listed 2. Log into [email or web address] and use [place]'s universal password to access. If you do not have the password, please contact the hospital operator. 3. Locate the [facility] provider you are looking for under [facility] and page to a number that you can be directly reached. 4. If you still have difficulty reaching the provider, please page the [facility] (Director on Call) for the Hospitalists listed on amion for assistance.  10/18/2019, 7:43 PM

## 2019-10-18 NOTE — ED Triage Notes (Signed)
Pt arrives to ED w/ c/o cellulitis to L leg x 4 weeks. Pt reports 2/10 pain, states he has been taking PO ABX. Dialysis pt, MWF and Saturday dialysis pt. Pt states he was hypotensive at home.

## 2019-10-19 ENCOUNTER — Encounter (HOSPITAL_COMMUNITY): Payer: Self-pay | Admitting: Infectious Disease

## 2019-10-19 LAB — COMPREHENSIVE METABOLIC PANEL
ALT: 15 U/L (ref 0–44)
AST: 19 U/L (ref 15–41)
Albumin: 2.3 g/dL — ABNORMAL LOW (ref 3.5–5.0)
Alkaline Phosphatase: 131 U/L — ABNORMAL HIGH (ref 38–126)
Anion gap: 11 (ref 5–15)
BUN: 33 mg/dL — ABNORMAL HIGH (ref 8–23)
CO2: 28 mmol/L (ref 22–32)
Calcium: 8.2 mg/dL — ABNORMAL LOW (ref 8.9–10.3)
Chloride: 95 mmol/L — ABNORMAL LOW (ref 98–111)
Creatinine, Ser: 3.97 mg/dL — ABNORMAL HIGH (ref 0.61–1.24)
GFR calc Af Amer: 15 mL/min — ABNORMAL LOW (ref >60)
GFR calc non Af Amer: 13 mL/min — ABNORMAL LOW (ref >60)
Glucose, Bld: 113 mg/dL — ABNORMAL HIGH (ref 70–99)
Potassium: 4.1 mmol/L (ref 3.5–5.1)
Sodium: 134 mmol/L — ABNORMAL LOW (ref 135–145)
Total Bilirubin: 1.2 mg/dL (ref 0.3–1.2)
Total Protein: 5.4 g/dL — ABNORMAL LOW (ref 6.5–8.1)

## 2019-10-19 LAB — CBC
HCT: 22.4 % — ABNORMAL LOW (ref 39.0–52.0)
Hemoglobin: 7.2 g/dL — ABNORMAL LOW (ref 13.0–17.0)
MCH: 30.8 pg (ref 26.0–34.0)
MCHC: 32.1 g/dL (ref 30.0–36.0)
MCV: 95.7 fL (ref 80.0–100.0)
Platelets: 90 10*3/uL — ABNORMAL LOW (ref 150–400)
RBC: 2.34 MIL/uL — ABNORMAL LOW (ref 4.22–5.81)
RDW: 20.4 % — ABNORMAL HIGH (ref 11.5–15.5)
WBC: 7.5 10*3/uL (ref 4.0–10.5)
nRBC: 0 % (ref 0.0–0.2)

## 2019-10-19 LAB — HEPATITIS B SURFACE ANTIBODY,QUALITATIVE: Hep B S Ab: NONREACTIVE

## 2019-10-19 LAB — HEPATITIS B SURFACE ANTIGEN: Hepatitis B Surface Ag: NONREACTIVE

## 2019-10-19 MED ORDER — SODIUM CHLORIDE 0.9 % IV SOLN: INTRAVENOUS | Status: DC

## 2019-10-19 MED ORDER — CHLORHEXIDINE GLUCONATE CLOTH 2 % EX PADS: 6.0000 | MEDICATED_PAD | Freq: Every day | CUTANEOUS | Status: DC

## 2019-10-19 MED ORDER — CHLORHEXIDINE GLUCONATE CLOTH 2 % EX PADS
6.0000 | MEDICATED_PAD | Freq: Every day | CUTANEOUS | Status: DC
  Administered 2019-10-19 – 2019-10-23 (×4): 6 via TOPICAL

## 2019-10-19 MED ORDER — SODIUM CHLORIDE 0.9 % IV BOLUS
250.0000 mL | INTRAVENOUS | Status: AC
  Administered 2019-10-19: 250 mL via INTRAVENOUS

## 2019-10-19 MED ORDER — CEFAZOLIN SODIUM-DEXTROSE 2-4 GM/100ML-% IV SOLN
2.0000 g | INTRAVENOUS | Status: DC
  Administered 2019-10-19 – 2019-10-22 (×3): 2 g via INTRAVENOUS
  Filled 2019-10-19 (×3): qty 100

## 2019-10-19 MED ORDER — HEPARIN SODIUM (PORCINE) 1000 UNIT/ML IJ SOLN
INTRAMUSCULAR | Status: AC
  Administered 2019-10-19: 3800 [IU]
  Filled 2019-10-19: qty 4

## 2019-10-19 NOTE — Progress Notes (Signed)
PROGRESS NOTE    Ricardo Payne  RFF:[id number] DOB: 07/11/1930 DOA: 10/18/2019 PCP: [name], FNP    Chief Complaint  Patient presents with  . Cellulitis    Brief Narrative:  Ricardo Payne is a 86 y.o. male with multiple medical problems significant of systolic and diastolic congestive failure, coronary disease, end-stage renal disease on dialysis Monday, Wednesday, Friday, Saturday, paroxysmal atrial fibrillation on chronic anticoagulation, ICD, hyperlipidemia, hypertension, history of CVA.  He presented to the emergency room because of worsening bilateral lower extremity swelling, erythema and pain of the left lower extremity which he says has been ongoing for the past couple of days with recent worsening.  Also complained of fatigue, hypotension, black-colored stool 10 days ago. Patient says he was started on doxycycline by his primary care physician which he took for maybe 2 days but he says it is not helping his lower extremity pain and cellulitis.    ED Course: Lab work in the ED showed hemoglobin 8.5.  His baseline is usually between 10 and 11.  His stool for occult blood was positive per the ER.  He was found to be hypotensive with blood pressure 90/47.   Assessment & Plan:   Principal Problem:   Hypotension Active Problems:   Cellulitis of leg   GI bleed   ESRD on dialysis (HCC)   Chronic combined systolic and diastolic CHF (congestive heart failure) (HCC)   Thrombocytopenia (HCC)   Blood thinned due to long-term anticoagulant use  Hypotension: Patient has chronic hypotension but now with worsening likely secondary to GI bleed, cellulitis.  His hemoglobin dropped to 8.5 in the past week and stool for occult blood was positive.  He is ESRD on dialysis with bilateral lower extremity edema.  Therefore reluctant to start him on continuous fluids.  Given 250 cc bolus yesterday after discussing with nephrology.  -This morning blood pressure is again low.  Will  order 250 cc bolus.  He is supposed to get his dialysis today.   Cellulitis of lower extremity: Patient has chronic bilateral lower extremity edema, also recently has been having erythema and tenderness worse in his left lower extremity. No open lesions noted at this time.  He says he was prescribed doxycycline which he took maybe for a day or so but not helping and keeps worsening.  Improving with IV Ancef.  Continue to monitor closely.  Denies having any fevers or chills.  GI bleed: Patient was found to have dropped his hemoglobin from 10.2-8.5 in the past week or so.  He had dark stools 10 days ago. He is on chronic anticoagulation with Eliquis due to paroxysmal atrial fibrillation.  Holding Eliquis at this time.  Consulted Gastroenterology.    Likely EGD in a.m.  Continue to monitor hemoglobin.  Consider transfusion for hemoglobin less than 7.  Stool for occult blood in the ED was positive per the ED provider.    Started IV PPI twice daily.  He denies having any abdominal pain, nausea or vomiting at this time.  ESRD on hemodialysis: Consulted nephrology.  Patient gets dialyzed Monday, Wednesday, Friday, Saturday.    Continue home medications.  He has bilateral lower extremity edema but respiratory status appears to be stable at this time.  Chest x-ray also appears to be stable.  Chronic combined systolic/diastolic and heart failure: Continue home medications.  Continue to monitor.  Thrombocytopenia: Likely has chronic thrombocytopenia?  Etiology unclear.  Continue to monitor platelets.  Paroxysmal atrial fibrillation: Continue amiodarone.  He is currently in sinus rhythm.  Discussed with cardiology regarding his anticoagulation.  He will need to be monitored on telemetry.  Per cardiology consult them if he flips into atrial fibrillation at that time he may need heparin.  DVT prophylaxis: SCD's given GI bleed Code Status:              Partial code Family Communication:  No family at  bedside Disposition Plan:              Patient is from:                        Home             Anticipated DC to:                   Home             Anticipated DC date:               2-3 days             Anticipated DC barriers:         Pending further work-up for GI bleed  Admission status:     Inpatient              Patient currently is not medically stable to d/c.    Consultants:   Gastroenterology  Nephrology   Procedures:   Likely EGD in a.m.   Antimicrobials:  Anti-infectives (From admission, onward)   Start     Dose/Rate Route Frequency Ordered Stop   10/19/19 1800  ceFAZolin (ANCEF) IVPB 2g/100 mL premix     2 g 200 mL/hr over 30 Minutes Intravenous Once per day on Mon Wed Fri Sat 10/19/19 1619     10/19/19 1500  ceFAZolin (ANCEF) IVPB 1 g/50 mL premix  Status:  Discontinued     1 g 100 mL/hr over 30 Minutes Intravenous Every 24 hours 10/18/19 1758 10/19/19 1618   10/18/19 1515  ceFAZolin (ANCEF) IVPB 1 g/50 mL premix     1 g 100 mL/hr over 30 Minutes Intravenous  Once 10/18/19 1510 10/18/19 1735         Subjective: He is complaining of fatigue.  Blood pressure this morning 92/50.   Objective: Vitals:   10/19/19 1400 10/19/19 1430 10/19/19 1500 10/19/19 1523  BP: (!) 96/46 (!) 94/47 (!) 96/45 (!) 106/47  Pulse: 81 81 81 81  Resp: 18 20 (!) 24 (!) 24  Temp:    98.5 F (36.9 C)  TempSrc:    Oral  SpO2:      Weight:      Height:        Intake/Output Summary (Last 24 hours) at 10/19/2019 1701 Last data filed at 10/19/2019 1523 Gross per 24 hour  Intake 501.33 ml  Output 2760 ml  Net -2258.67 ml   Filed Weights   10/18/19 2057 10/19/19 0005 10/19/19 1215  Weight: 94.5 kg 94.6 kg 95.4 kg    Examination:  General exam: Appears calm and comfortable  Respiratory system: Decreased breath sounds lower lobes otherwise clear to auscultation. Respiratory effort normal. Cardiovascular system: S1 & S2 heard, RRR. No JVD, murmurs, rubs, gallops or  clicks. No pedal edema. Gastrointestinal system: Abdomen is nondistended, soft and nontender. No masses felt. Normal bowel sounds heard. Central nervous system: Alert and oriented. No focal neurological deficits. Extremities: Bilateral lower extremity edema with erythema, worse on the left lower  extremity, he has a few scabbed lesions without any open wounds, has some tenderness to palpation especially on the left lower extremity. Skin: No rashes or ulcers Psychiatry: Judgement and insight appear normal. Mood & affect appropriate.     Data Reviewed: I have personally reviewed following labs and imaging studies  CBC: Recent Labs  Lab 10/18/19 1249 10/19/19 0511  WBC 8.3 7.5  NEUTROABS 6.2  --   HGB 8.5* 7.2*  HCT 26.3* 22.4*  MCV 96.3 95.7  PLT 106* 90*    Basic Metabolic Panel: Recent Labs  Lab 10/18/19 1249 10/19/19 0511  NA 135 134*  K 3.7 4.1  CL 94* 95*  CO2 30 28  GLUCOSE 100* 113*  BUN 24* 33*  CREATININE 3.51* 3.97*  CALCIUM 8.5* 8.2*    GFR: Estimated Creatinine Clearance: 14.2 mL/min (A) (by C-G formula based on SCr of 3.97 mg/dL (H)).  Liver Function Tests: Recent Labs  Lab 10/18/19 1249 10/19/19 0511  AST 18 19  ALT 18 15  ALKPHOS 157* 131*  BILITOT 1.5* 1.2  PROT 6.2* 5.4*  ALBUMIN 2.6* 2.3*    CBG: No results for input(s): GLUCAP in the last 168 hours.   Recent Results (from the past 240 hour(s))  Culture, blood (Routine x 2)     Status: None (Preliminary result)   Collection Time: 10/18/19 12:45 PM   Specimen: BLOOD  Result Value Ref Range Status   Specimen Description BLOOD RIGHT ANTECUBITAL  Final   Special Requests   Final    BOTTLES DRAWN AEROBIC AND ANAEROBIC Blood Culture adequate volume   Culture   Final    NO GROWTH < 24 HOURS Performed at [facility], [address], [place], [place] [address]    Report Status PENDING  Incomplete  Culture, blood (Routine x 2)     Status: None (Preliminary result)   Collection Time:  10/18/19  1:03 PM   Specimen: BLOOD RIGHT HAND  Result Value Ref Range Status   Specimen Description BLOOD RIGHT HAND  Final   Special Requests   Final    BOTTLES DRAWN AEROBIC AND ANAEROBIC Blood Culture results may not be optimal due to an inadequate volume of blood received in culture bottles   Culture   Final    NO GROWTH < 24 HOURS Performed at [facility], [place] [address], [place], [place] [address]    Report Status PENDING  Incomplete  SARS Coronavirus 2 by RT PCR (hospital order, performed in [facility] hospital lab) Nasopharyngeal Nasopharyngeal Swab     Status: None   Collection Time: 10/18/19  5:17 PM   Specimen: Nasopharyngeal Swab  Result Value Ref Range Status   SARS Coronavirus 2 NEGATIVE NEGATIVE Final    Comment: (NOTE) SARS-CoV-2 target nucleic acids are NOT DETECTED. The SARS-CoV-2 RNA is generally detectable in upper and lower respiratory specimens during the acute phase of infection. The lowest concentration of SARS-CoV-2 viral copies this assay can detect is 250 copies / mL. A negative result does not preclude SARS-CoV-2 infection and should not be used as the sole basis for treatment or other patient management decisions.  A negative result may occur with improper specimen collection / handling, submission of specimen other than nasopharyngeal swab, presence of viral mutation(s) within the areas targeted by this assay, and inadequate number of viral copies (<250 copies / mL). A negative result must be combined with clinical observations, patient history, and epidemiological information. Fact Sheet for Patients:  StrictlyIdeas.no Fact Sheet for Healthcare Providers: BankingDealers.co.za This test is not yet approved or cleared  by the [place] FDA and has been authorized for detection and/or diagnosis of SARS-CoV-2 by FDA under an Emergency Use Authorization (EUA).  This EUA will remain in effect  (meaning this test can be used) for the duration of the COVID-19 declaration under Section 564(b)(1) of the Act, 21 U.S.C. section 360bbb-3(b)(1), unless the authorization is terminated or revoked sooner. Performed at [facility], [place] [address] [place], [place] [address]          Radiology Studies: DG Chest Portable 1 View  Result Date: 10/18/2019 CLINICAL DATA:  Fluid overload EXAM: PORTABLE CHEST 1 VIEW COMPARISON:  Chest radiograph dated 09/01/2019 FINDINGS: A right internal jugular central venous catheter tip overlies the superior cavoatrial junction. A left subclavian approach cardiac device is redemonstrated. Median sternotomy wires are seen. The heart remains enlarged. Vascular calcifications are seen in the aortic arch. Both lungs are clear. The visualized skeletal structures are unremarkable. IMPRESSION: 1. No acute cardiopulmonary process. 2. Cardiomegaly. Electronically Signed   By: Zerita Boers M.D.   On: 10/18/2019 17:59   VAS [place] LOWER EXTREMITY VENOUS (DVT) ([facility] and [facility] 7a-7p)  Result Date: 10/18/2019  Lower Venous DVTStudy Indications: Edema.  Limitations: Body habitus and poor ultrasound/tissue interface. Comparison Study: 07/06/19 Performing Technologist: Abram Sander RVS  Examination Guidelines: A complete evaluation includes B-mode imaging, spectral Doppler, color Doppler, and power Doppler as needed of all accessible portions of each vessel. Bilateral testing is considered an integral part of a complete examination. Limited examinations for reoccurring indications may be performed as noted. The reflux portion of the exam is performed with the patient in reverse Trendelenburg.  +---------+---------------+---------+-----------+----------+------------------+ RIGHT    CompressibilityPhasicitySpontaneityPropertiesThrombus Aging     +---------+---------------+---------+-----------+----------+------------------+ CFV      Full           Yes      Yes                                      +---------+---------------+---------+-----------+----------+------------------+ SFJ      Full                                                            +---------+---------------+---------+-----------+----------+------------------+ FV Prox  Full                                                            +---------+---------------+---------+-----------+----------+------------------+ FV Mid   Full                                                            +---------+---------------+---------+-----------+----------+------------------+ FV DistalFull                                                            +---------+---------------+---------+-----------+----------+------------------+  PFV      Full                                                            +---------+---------------+---------+-----------+----------+------------------+ POP      Full           Yes      Yes                                     +---------+---------------+---------+-----------+----------+------------------+ PTV      Full                                         limited                                                                  visualization      +---------+---------------+---------+-----------+----------+------------------+ PERO                                                  Not visualized     +---------+---------------+---------+-----------+----------+------------------+   +---------+---------------+---------+-----------+----------+--------------+ LEFT     CompressibilityPhasicitySpontaneityPropertiesThrombus Aging +---------+---------------+---------+-----------+----------+--------------+ CFV      Full           Yes      Yes                                 +---------+---------------+---------+-----------+----------+--------------+ SFJ      Full                                                         +---------+---------------+---------+-----------+----------+--------------+ FV Prox  Full                                                        +---------+---------------+---------+-----------+----------+--------------+ FV Mid   Full                                                        +---------+---------------+---------+-----------+----------+--------------+ FV Distal               Yes      Yes                                 +---------+---------------+---------+-----------+----------+--------------+  PFV      Full                                                        +---------+---------------+---------+-----------+----------+--------------+ POP                     Yes      Yes                                 +---------+---------------+---------+-----------+----------+--------------+ PTV      Full                                                        +---------+---------------+---------+-----------+----------+--------------+ PERO                                                  Not visualized +---------+---------------+---------+-----------+----------+--------------+     Summary: BILATERAL: - No evidence of deep vein thrombosis seen in the lower extremities, bilaterally. -   *See table(s) above for measurements and observations. Electronically signed by Monica Martinez MD on 10/18/2019 at 5:36:01 PM.    Final         Scheduled Meds: . allopurinol  50 mg Oral QODAY  . amiodarone  200 mg Oral Daily  . Chlorhexidine Gluconate Cloth  6 each Topical Daily  . l-methylfolate-B6-B12  1 tablet Oral Daily  . midodrine  15 mg Oral TID  . multivitamin  1 tablet Oral QHS  . pantoprazole (PROTONIX) IV  40 mg Intravenous Q12H  . pramipexole  0.25 mg Oral BID  . rosuvastatin  10 mg Oral Daily  . sevelamer carbonate  800 mg Oral TID WC  . tamsulosin  0.4 mg Oral Daily   Continuous Infusions: .  ceFAZolin (ANCEF) IV       LOS: 1 day    Time spent: 35  min  Yaakov Guthrie, MD [facility]   To contact the attending provider between 7A-7P or the covering provider during after hours 7P-7A, please log into the web site [email or web address] and access using universal [place] password for that web site. If you do not have the password, please call the hospital operator.  10/19/2019, 5:01 PM

## 2019-10-19 NOTE — Plan of Care (Signed)

## 2019-10-19 NOTE — Progress Notes (Signed)
Patient taken to Hemodialysis via transport.  Central tele notified.  Transported on [facility].  No c/o pain or discomfort.

## 2019-10-19 NOTE — Progress Notes (Signed)
   10/19/19 0033  Assess: MEWS Score  Temp 98.2 F (36.8 C)  BP (!) 84/50  Pulse Rate 66  Resp 20  Level of Consciousness Alert  SpO2 100 %  O2 Device Nasal Cannula  O2 Flow Rate (L/min) 1 L/min  Assess: MEWS Score  MEWS Temp 0  MEWS Systolic 1  MEWS Pulse 0  MEWS RR 1  MEWS LOC 0  MEWS Score 2  MEWS Score Color Yellow  Assess: if the MEWS score is Yellow or Red  Were vital signs taken at a resting state? Yes  Focused Assessment Documented focused assessment  Early Detection of Sepsis Score *See Row Information* Low  Take Vital Signs  Increase Vital Sign Frequency  Yellow: Q 2hr X 2 then Q 4hr X 2, if remains yellow, continue Q 4hrs  Escalate  MEWS: Escalate Yellow: discuss with charge nurse/RN and consider discussing with provider and RRT  Notify: Charge Nurse/RN  Name of Charge Nurse/RN Notified [place]  Date Charge Nurse/RN Notified 10/19/19  Time Charge Nurse/RN Notified 0045  Notify: Provider  Provider Name/Title Sharlet Salina  Date Provider Notified 10/19/19  Time Provider Notified [phone]  Notification Type Page  Notification Reason Other (Comment)  Response No new orders  Document  Progress note created (see row info) Yes   Pt admitted for hypotension. On call provider notified with no new orders received. Pt asymptomatic.

## 2019-10-19 NOTE — Procedures (Signed)
   I was present at this dialysis session, have reviewed the session itself and made  appropriate changes Kelly Splinter MD [place] pager [phone]   10/19/2019, 2:18 PM

## 2019-10-19 NOTE — Consult Note (Signed)
Renal Service Consult Note [facility]  Ricardo Payne 10/19/2019 Sol Blazing Requesting Physician:  Dr Barth Kirks  Reason for Consult:  ESRD pt w/ anemia, gen weakness HPI: The patient is a 86 y.o. year-old w/ hx of pHTN, PAF, HL, ICM w ICD, sp CVA, HTN, ESRD on HD, gout, CAD sp CABG 09-09-1988 and BPH presented to ED on 5/20 yesterday w/ c/o bilat worsening leg edema, pain LLE, fatigue/ gen weakness. Had been taking po doxy per PCP for leg cellulitis but not helping.  In ED Hb was 8.5 which is down from his baseline 10- 11. Guiac + in ED. BP's 90/50 in ED. Pt was admitted . Asked to see for ESRD.    Pt was started on dialysis in April 2021 last month for progressive CKD/ cardiorenal syndrome.  TDC was placed and there was "no plan for permanent access" due to mult comorbidities and technical issues (h/o ICD e.g.).  In April was vol overloaded and got serial HD after diuretics/ inotropes did not succeed.    Today pt c/o leg pain and swelling, no other c/o's.    Pt worked on [employer] as a Freight forwarder for 30 yrs, was married to his 1st wife then.  He remarried and his 2nd wife died in September 09, 2005.  Pt moved to  from [facility] in 09/10/2018 at son's request to be closer by.  Lives alone here , son is within 5 min distance.        ROS  denies CP  no joint pain   no HA  no blurry vision  no rash  no diarrhea  no nausea/ vomiting    Past Medical History  Past Medical History:  Diagnosis Date  . Acquired thrombophilia ([place])   . Age-related cataract of both eyes   . ASHD (arteriosclerotic heart disease)   . Benign prostatic hyperplasia   . CAD (coronary artery disease)    a. s/p CABG x1 in 09/09/88  . Cerebellar stroke ([place]) 07/02/2016  . Chronic combined systolic and diastolic CHF (congestive heart failure) ([place])   . Chronic gout without tophus   . CKD (chronic kidney disease), stage IV ([place])   . CVA (cerebral vascular accident) ([place]) 09-Sep-2016  . Essential hypertension   . ICD  (implantable cardioverter-defibrillator) in place   . Ischemic cardiomyopathy   . Mixed hyperlipidemia   . PAF (paroxysmal atrial fibrillation) ([place])   . Restless legs   . Secondary pulmonary arterial hypertension ([place])   . Thrombocytopenia ([place])   . Vitamin D deficiency    Past Surgical History  Past Surgical History:  Procedure Laterality Date  . CARDIOVERSION N/A 08/22/2019   Procedure: CARDIOVERSION;  Surgeon: Larey Dresser, MD;  Location: [facility] ENDOSCOPY;  Service: Cardiovascular;  Laterality: N/A;  . CHOLECYSTECTOMY    . CORONARY ARTERY BYPASS GRAFT    . IR FLUORO GUIDE CV LINE RIGHT  08/23/2019  . IR US GUIDE VASC ACCESS RIGHT  08/23/2019  . PACEMAKER IMPLANT  10/2018  . TEE WITHOUT CARDIOVERSION N/A 08/22/2019   Procedure: TRANSESOPHAGEAL ECHOCARDIOGRAM (TEE);  Surgeon: Larey Dresser, MD;  Location: [facility] ENDOSCOPY;  Service: Cardiovascular;  Laterality: N/A;   Family History  Family History  Problem Relation Age of Onset  . Hypertension Mother   . Suicidality Mother   . Valvular heart disease Father   . Heart disease Father   . Heart attack Brother    Social History  reports that he quit smoking about 33 years ago. He  has never used smokeless tobacco. He reports current alcohol use. He reports that he does not use drugs. Allergies No Known Allergies Home medications Prior to Admission medications   Medication Sig Start Date End Date Taking? Authorizing Provider  acetaminophen (TYLENOL) 500 MG tablet Take 1,000 mg by mouth every 6 (six) hours as needed for mild pain.   Yes [provider]  allopurinol (ZYLOPRIM) 100 MG tablet Take 0.5 tablets (50 mg total) by mouth every other day. 07/12/19  Yes [name], MD  amiodarone (PACERONE) 200 MG tablet Take 1 tablet (200 mg total) by mouth daily. 09/11/19  Yes Clegg, Amy D, NP  apixaban (ELIQUIS) 5 MG TABS tablet Take 1 tablet (5 mg total) by mouth 2 (two) times daily. 09/24/19  Yes Larey Dresser, MD  docusate  sodium (COLACE) 50 MG capsule Take 50 mg by mouth daily.   Yes [provider]  doxycycline (VIBRA-TABS) 100 MG tablet Take 100 mg by mouth 2 (two) times daily. 10/18/19  Yes [provider]  L-Methylfolate-B6-B12 (FOLTANX) 3-35-2 MG TABS Take 1 tablet by mouth daily. 09/11/19  Yes O'Neal, Cassie Freer, MD  midodrine (PROAMATINE) 10 MG tablet Take 15 mg by mouth 3 (three) times daily.   Yes [provider]  multivitamin (RENA-VIT) TABS tablet Take 1 tablet by mouth at bedtime. 09/11/19  Yes Clegg, Amy D, NP  pramipexole (MIRAPEX) 0.25 MG tablet Take 0.25 mg by mouth 2 (two) times daily.   Yes [provider]  rosuvastatin (CRESTOR) 10 MG tablet Take 1 tablet (10 mg total) by mouth daily. 09/24/19 09/18/20 Yes Larey Dresser, MD  sevelamer (RENAGEL) 800 MG tablet Take 800 mg by mouth 3 (three) times daily with meals.   Yes [provider]  tamsulosin (FLOMAX) 0.4 MG CAPS capsule Take 1 capsule (0.4 mg total) by mouth daily. 07/21/19  Yes [name], MD  predniSONE (DELTASONE) 20 MG tablet Take 4 tablets (80 mg total) by mouth daily with breakfast. Patient not taking: Reported on 10/18/2019 09/11/19   Darrick Grinder D, NP     Vitals:   10/19/19 0033 10/19/19 0231 10/19/19 0429 10/19/19 0733  BP: (!) 84/50 (!) 103/48 (!) 101/49 (!) 97/52  Pulse: 66 70 71 66  Resp: 20 20 20 20   Temp: 98.2 F (36.8 C) 98.2 F (36.8 C) 97.7 F (36.5 C) 97.7 F (36.5 C)  TempSrc: Oral Oral Oral Oral  SpO2: 100% 100% 100% 100%  Weight:      Height:       Exam Gen alert, no distress, elderly WM No rash, cyanosis or gangrene Sclera anicteric, throat clear  No jvd or bruits Chest clear bilat to bases no rales, wheezing or bronchial BS RRR no MRG Abd soft ntnd no mass or ascites +bs GU normal male MS no joint effusions or deformity Ext chronic bilat 2+ pitting lower> upper leg edema, mild erythema L lower leg Neuro is alert, Ox 3 , nf     Home meds:  -  amiodarone 200/ eliquis 5 bid/ midodrine 15 tid/ crestor 10  - flomax 0.4/ allopurinol 50 qod  - renagel 1 ac tid  - pramipexole 0.25 bid  - prednisone 80 qd  - prn's/ vitamins/ supplements  CXR - no active  ECHO 07/2019 - LVEF < 20%   OP HD: MWFSat  4h  400/800   93kg  2/2.25  Hep none  TDC  RIJ  - mirc 50 q 2, last 5/17  - venofer 50/wk  Assessment/ Plan: 1. Gen weakness - multifactorial severe DCM, new anemia, esrd, deconditioning 2. Anemia/ guiac+ - per GI/ pmd, poss GIB 3. ESRD - on HD MWFSat , 4x per wk. Plan HD today and tomorrow.  4. Vol excess / LE edema - chronic issue, difficult to control w/ severe CM and low BP's. This is the reason for HD 4x/wk. Looks like he could use lowering of the dry wt while here if possible.  5. CM/ sp ICD 6. CAD sp CABG '90 7. PAF 8. H/o CVA 9. Anemia ckd - due to esa on 5/24 10. MBD ckd - Ca++ ok, get phos, cont renagel      Kelly Splinter  MD 10/19/2019, 10:22 AM  Recent Labs  Lab 10/18/19 1249 10/19/19 0511  WBC 8.3 7.5  HGB 8.5* 7.2*   Recent Labs  Lab 10/18/19 1249 10/19/19 0511  K 3.7 4.1  BUN 24* 33*  CREATININE 3.51* 3.97*  CALCIUM 8.5* 8.2*

## 2019-10-19 NOTE — Consult Note (Signed)
Subjective:   HPI  The patient is an 86 year old male with multiple medical problems including systolic and diastolic congestive heart failure, coronary artery disease and end-stage renal disease who is on dialysis 4 times a week.  He has paroxysmal atrial fibrillation, ICD, hyperlipidemia, hypertension, and a history of a CVA.  We are asked to see him in regards to anemia with a recent drop in hemoglobin and hematocrit and heme positive stool.  The patient reports that 8 or 10 days ago he had a couple of days of dark-colored bowel movements but then he started taking some stool softeners and these dark-colored bowel movements went away.  I asked him if they were black in color and he said yes.  He is no longer having these however.  He has been on Eliquis but it was stopped on admission yesterday.  He denies hematemesis.  Review of Systems   Denies chest pain.  Positive for shortness of breath but not bad at this time  Past Medical History:  Diagnosis Date  . Acquired thrombophilia ([place])   . Age-related cataract of both eyes   . ASHD (arteriosclerotic heart disease)   . Benign prostatic hyperplasia   . CAD (coronary artery disease)    a. s/p CABG x1 in 1990  . Cerebellar stroke ([place]) 07/02/2016  . Chronic combined systolic and diastolic CHF (congestive heart failure) ([place])   . Chronic gout without tophus   . CKD (chronic kidney disease), stage IV ([place])   . CVA (cerebral vascular accident) ([place]) 2018  . Essential hypertension   . ICD (implantable cardioverter-defibrillator) in place   . Ischemic cardiomyopathy   . Mixed hyperlipidemia   . PAF (paroxysmal atrial fibrillation) ([place])   . Restless legs   . Secondary pulmonary arterial hypertension ([place])   . Thrombocytopenia ([place])   . Vitamin D deficiency    Past Surgical History:  Procedure Laterality Date  . CARDIOVERSION N/A 08/22/2019   Procedure: CARDIOVERSION;  Surgeon: Larey Dresser, MD;  Location: [facility] ENDOSCOPY;  Service:  Cardiovascular;  Laterality: N/A;  . CHOLECYSTECTOMY    . CORONARY ARTERY BYPASS GRAFT    . IR FLUORO GUIDE CV LINE RIGHT  08/23/2019  . IR US GUIDE VASC ACCESS RIGHT  08/23/2019  . PACEMAKER IMPLANT  10/2018  . TEE WITHOUT CARDIOVERSION N/A 08/22/2019   Procedure: TRANSESOPHAGEAL ECHOCARDIOGRAM (TEE);  Surgeon: Larey Dresser, MD;  Location: [facility] ENDOSCOPY;  Service: Cardiovascular;  Laterality: N/A;   Social History   Socioeconomic History  . Marital status: Widowed    Spouse name: Not on file  . Number of children: 2  . Years of education: Not on file  . Highest education level: Not on file  Occupational History  . Not on file  Tobacco Use  . Smoking status: Former Smoker    Quit date: 1988    Years since quitting: 33.4  . Smokeless tobacco: Never Used  Substance and Sexual Activity  . Alcohol use: Yes    Comment: rare  . Drug use: Never  . Sexual activity: Not on file  Other Topics Concern  . Not on file  Social History Narrative  . Not on file   Social Determinants of Health   Financial Resource Strain:   . Difficulty of Paying Living Expenses:   Food Insecurity:   . Worried About Charity fundraiser in the Last Year:   . Arboriculturist in the Last Year:   Transportation Needs:   . Lack  of Transportation (Medical):   Marland Kitchen Lack of Transportation (Non-Medical):   Physical Activity:   . Days of Exercise per Week:   . Minutes of Exercise per Session:   Stress:   . Feeling of Stress :   Social Connections:   . Frequency of Communication with Friends and Family:   . Frequency of Social Gatherings with Friends and Family:   . Attends Religious Services:   . Active Member of Clubs or Organizations:   . Attends Archivist Meetings:   Marland Kitchen Marital Status:   Intimate Partner Violence:   . Fear of Current or Ex-Partner:   . Emotionally Abused:   Marland Kitchen Physically Abused:   . Sexually Abused:    family history includes Heart attack in his brother; Heart disease in  his father; Hypertension in his mother; Suicidality in his mother; Valvular heart disease in his father.  Current Facility-Administered Medications:  .  acetaminophen (TYLENOL) tablet 1,000 mg, 1,000 mg, Oral, Q6H PRN, Matcha, Anupama, MD, 1,000 mg at 10/18/19 2223 .  allopurinol (ZYLOPRIM) tablet 50 mg, 50 mg, Oral, QODAY, Matcha, Anupama, MD, 50 mg at 10/19/19 0813 .  amiodarone (PACERONE) tablet 200 mg, 200 mg, Oral, Daily, Matcha, Anupama, MD, 200 mg at 10/19/19 0814 .  ceFAZolin (ANCEF) IVPB 1 g/50 mL premix, 1 g, Intravenous, Q24H, Pham, Mimi L, RPH .  Chlorhexidine Gluconate Cloth 2 % PADS 6 each, 6 each, Topical, Daily, Matcha, Anupama, MD, 6 each at 10/19/19 0814 .  Chlorhexidine Gluconate Cloth 2 % PADS 6 each, 6 each, Topical, Q0600, Roney Jaffe, MD .  Chlorhexidine Gluconate Cloth 2 % PADS 6 each, 6 each, Topical, Q0600, Roney Jaffe, MD .  l-methylfolate-B6-B12 (METANX) 3-35-2 MG per tablet 1 tablet, 1 tablet, Oral, Daily, Matcha, Anupama, MD, 1 tablet at 10/19/19 0934 .  midodrine (PROAMATINE) tablet 15 mg, 15 mg, Oral, TID, Matcha, Anupama, MD, 15 mg at 10/19/19 6387 .  multivitamin (RENA-VIT) tablet 1 tablet, 1 tablet, Oral, QHS, Matcha, Anupama, MD, 1 tablet at 10/18/19 2223 .  pantoprazole (PROTONIX) injection 40 mg, 40 mg, Intravenous, Q12H, Matcha, Anupama, MD, 40 mg at 10/19/19 0814 .  pramipexole (MIRAPEX) tablet 0.25 mg, 0.25 mg, Oral, BID, Matcha, Anupama, MD, 0.25 mg at 10/19/19 0934 .  rosuvastatin (CRESTOR) tablet 10 mg, 10 mg, Oral, Daily, Matcha, Anupama, MD, 10 mg at 10/19/19 0813 .  sevelamer carbonate (RENVELA) tablet 800 mg, 800 mg, Oral, TID WC, Matcha, Anupama, MD, 800 mg at 10/19/19 0812 .  tamsulosin (FLOMAX) capsule 0.4 mg, 0.4 mg, Oral, Daily, Matcha, Anupama, MD, 0.4 mg at 10/19/19 0813 No Known Allergies   Objective:     BP (!) 97/52 (BP Location: Left Arm)   Pulse 66   Temp 97.7 F (36.5 C) (Oral)   Resp 20   Ht 5\' 6"  (1.676 m)   Wt 94.6 kg  Comment: scale c  SpO2 100%   BMI 33.67 kg/m   No distress  Heart regular  Lungs clear  Abdomen soft nontender  Laboratory No components found for: D1    Assessment:     Anemia  Recent melena  Multiple medical problems as mentioned above  Anticoagulation stopped yesterday      Plan:     In view of the melena and drop in hemoglobin I think it would be reasonable to do an EGD tomorrow.  We discussed this and he is agreeable. Lab Results  Component Value Date   HGB 7.2 (L) 10/19/2019   HGB 8.5 (  L) 10/18/2019   HGB 10.2 (L) 10/09/2019   HGB 10.5 (L) 10/02/2019   HGB 11.0 (L) 09/18/2019   HGB 11.5 (L) 08/13/2019   HGB 11.2 (L) 06/25/2019   HGB 11.7 (L) 05/18/2019   HCT 22.4 (L) 10/19/2019   HCT 26.3 (L) 10/18/2019   HCT 31.8 (L) 10/09/2019   HCT 35.5 (L) 08/13/2019   HCT 34.3 (L) 05/18/2019   ALKPHOS 131 (H) 10/19/2019   ALKPHOS 157 (H) 10/18/2019   ALKPHOS 165 (H) 10/09/2019   AST 19 10/19/2019   AST 18 10/18/2019   AST 19 10/09/2019   AST 52 (H) 09/24/2019   AST 47 (H) 09/18/2019   AST 26 06/11/2019   ALT 15 10/19/2019   ALT 18 10/18/2019   ALT 30 10/09/2019   ALT 52 (H) 09/24/2019   ALT 72 (H) 09/18/2019   ALT 25 06/11/2019

## 2019-10-19 NOTE — Progress Notes (Signed)
Remote ICD transmission.   

## 2019-10-20 ENCOUNTER — Encounter (HOSPITAL_COMMUNITY): Payer: Self-pay | Admitting: Anesthesiology

## 2019-10-20 ENCOUNTER — Encounter (HOSPITAL_COMMUNITY): Payer: Self-pay | Admitting: Infectious Disease

## 2019-10-20 ENCOUNTER — Encounter (HOSPITAL_COMMUNITY)
Admission: EM | Disposition: A | Discharge status: Home / Self Care | Payer: Self-pay | Source: Home / Self Care | Attending: Infectious Disease

## 2019-10-20 LAB — RENAL FUNCTION PANEL
Albumin: 2.2 g/dL — ABNORMAL LOW (ref 3.5–5.0)
Anion gap: 9 (ref 5–15)
BUN: 25 mg/dL — ABNORMAL HIGH (ref 8–23)
CO2: 29 mmol/L (ref 22–32)
Calcium: 8.1 mg/dL — ABNORMAL LOW (ref 8.9–10.3)
Chloride: 96 mmol/L — ABNORMAL LOW (ref 98–111)
Creatinine, Ser: 3.09 mg/dL — ABNORMAL HIGH (ref 0.61–1.24)
GFR calc Af Amer: 20 mL/min — ABNORMAL LOW (ref >60)
GFR calc non Af Amer: 17 mL/min — ABNORMAL LOW (ref >60)
Glucose, Bld: 102 mg/dL — ABNORMAL HIGH (ref 70–99)
Phosphorus: 4.2 mg/dL (ref 2.5–4.6)
Potassium: 3.6 mmol/L (ref 3.5–5.1)
Sodium: 134 mmol/L — ABNORMAL LOW (ref 135–145)

## 2019-10-20 LAB — CBC
HCT: 21.3 % — ABNORMAL LOW (ref 39.0–52.0)
Hemoglobin: 6.8 g/dL — CL (ref 13.0–17.0)
MCH: 31.1 pg (ref 26.0–34.0)
MCHC: 31.9 g/dL (ref 30.0–36.0)
MCV: 97.3 fL (ref 80.0–100.0)
Platelets: 92 10*3/uL — ABNORMAL LOW (ref 150–400)
RBC: 2.19 MIL/uL — ABNORMAL LOW (ref 4.22–5.81)
RDW: 21.3 % — ABNORMAL HIGH (ref 11.5–15.5)
WBC: 7 10*3/uL (ref 4.0–10.5)
nRBC: 0 % (ref 0.0–0.2)

## 2019-10-20 LAB — PREPARE RBC (CROSSMATCH)

## 2019-10-20 LAB — HEPATITIS B SURFACE ANTIBODY, QUANTITATIVE: Hep B S AB Quant (Post): <3.1 m[IU]/mL — ABNORMAL LOW (ref >9.9)

## 2019-10-20 SURGERY — CANCELLED PROCEDURE

## 2019-10-20 MED ORDER — HEPARIN SODIUM (PORCINE) 1000 UNIT/ML IJ SOLN
INTRAMUSCULAR | Status: AC
  Administered 2019-10-20: 3200 [IU]
  Filled 2019-10-20: qty 4

## 2019-10-20 MED ORDER — DOCUSATE SODIUM 50 MG PO CAPS
50.0000 mg | ORAL_CAPSULE | Freq: Every day | ORAL | Status: DC | PRN
  Administered 2019-10-20 – 2019-10-23 (×3): 50 mg via ORAL
  Filled 2019-10-20 (×5): qty 1

## 2019-10-20 MED ORDER — SODIUM CHLORIDE 0.9% IV SOLUTION: Freq: Once | INTRAVENOUS | Status: AC

## 2019-10-20 SURGICAL SUPPLY — 14 items

## 2019-10-20 NOTE — Progress Notes (Signed)
EGD cancelled for 5/22.  Pt need type/screen and PRBC transfusion prior to procedure.  Procedure to be completed tomorrow.  Pt informed and MD Ganem at bedside.  Pt verbalized understanding.  Able to have clear liquid diet until midnight tonight per MD Penelope Coop.

## 2019-10-20 NOTE — Progress Notes (Signed)
The patient was seen and overall he is doing well but he had a drop in his hemoglobin and hematocrit and so we canceled his endoscopy for today until he gets a blood transfusion.  He understands.

## 2019-10-20 NOTE — Progress Notes (Signed)
Patient received 1 unit of blood.  It is running at 120 due to his CHF.

## 2019-10-20 NOTE — Plan of Care (Signed)

## 2019-10-20 NOTE — Progress Notes (Signed)
PROGRESS NOTE    Ricardo Payne  FTD:[id number] DOB: 02/25/1931 DOA: 10/18/2019 PCP: [name], FNP    Chief Complaint  Patient presents with  . Cellulitis    Brief Narrative:  Ricardo Payne is a 86 y.o. male with multiple medical problems significant of systolic and diastolic congestive failure, coronary disease, end-stage renal disease on dialysis Monday, Wednesday, Friday, Saturday, paroxysmal atrial fibrillation on chronic anticoagulation, ICD, hyperlipidemia, hypertension, history of CVA.  He presented to the emergency room because of worsening bilateral lower extremity swelling, erythema and pain of the left lower extremity which he says has been ongoing for the past couple of days with recent worsening.  Also complained of fatigue, hypotension, black-colored stool 10 days ago. Patient says he was started on doxycycline by his primary care physician which he took for maybe 2 days but he says it is not helping his lower extremity pain and cellulitis.    ED Course: Lab work in the ED showed hemoglobin 8.5.  His baseline is usually between 10 and 11.  His stool for occult blood was positive per the ER.  He was found to be hypotensive with blood pressure 90/47.   Assessment & Plan:   Principal Problem:   Hypotension Active Problems:   Cellulitis of leg   GI bleed   ESRD on dialysis (HCC)   Chronic combined systolic and diastolic CHF (congestive heart failure) (HCC)   Thrombocytopenia (HCC)   Blood thinned due to long-term anticoagulant use  Hypotension: Patient has chronic hypotension but now with worsening likely secondary to GI bleed, cellulitis.  His hemoglobin dropped to 6.8 this morning.  He received 250 cc bolus x2 for hypotension. He is getting his dialysis today.   Discussed with nephrology and will order 1 unit PRBC.  Continue to monitor.  Cellulitis of lower extremity: Patient has chronic bilateral lower extremity edema, also recently has been having  erythema and tenderness worse in his left lower extremity. No open lesions noted at this time.  He says he was prescribed doxycycline which he took maybe for a day or so but not helping and keeps worsening.  Improving with IV Ancef.  Continue to monitor closely.  Denies having any fevers or chills.  GI bleed: Patient was found to have dropped his hemoglobin from 10.2-8.5 in the past week or so.  He had dark stools 10 days ago. He is on chronic anticoagulation with Eliquis due to paroxysmal atrial fibrillation.  Holding Eliquis at this time.  This morning his hemoglobin dropped to 6.8. Consulted Gastroenterology.    Likely EGD today.we will order 1 unit PRBC.  Continue to monitor hemoglobin. On IV PPI twice daily.  He denies having any abdominal pain, nausea or vomiting at this time.  ESRD on hemodialysis: Dialysis as per nephrology.  Patient gets dialyzed Monday, Wednesday, Friday, Saturday.    Continue home medications.   Chronic combined systolic/diastolic and heart failure: Continue home medications.  Continue to monitor.  Thrombocytopenia: Likely has chronic thrombocytopenia?  Etiology unclear.  Continue to monitor platelets.  Paroxysmal atrial fibrillation: Continue amiodarone.  He is currently in sinus rhythm.  Discussed with cardiology regarding his anticoagulation.  He will need to be monitored on telemetry.  Per cardiology consult them if he flips into atrial fibrillation at that time he may need heparin.  DVT prophylaxis: SCD's given GI bleed Code Status:              Partial code Family Communication:  No family  at bedside Disposition Plan:              Patient is from:                        Home             Anticipated DC to:                   Home             Anticipated DC date:               3- 4 days             Anticipated DC barriers:         Pending further work-up for GI bleed  Admission status:     Inpatient              Patient currently is not medically stable to  d/c.    Consultants:   Gastroenterology  Nephrology   Procedures:   Likely EGD today   Antimicrobials:  Anti-infectives (From admission, onward)   Start     Dose/Rate Route Frequency Ordered Stop   10/19/19 1800  ceFAZolin (ANCEF) IVPB 2g/100 mL premix     2 g 200 mL/hr over 30 Minutes Intravenous Once per day on Mon Wed Fri Sat 10/19/19 1619     10/19/19 1500  ceFAZolin (ANCEF) IVPB 1 g/50 mL premix  Status:  Discontinued     1 g 100 mL/hr over 30 Minutes Intravenous Every 24 hours 10/18/19 1758 10/19/19 1618   10/18/19 1515  ceFAZolin (ANCEF) IVPB 1 g/50 mL premix     1 g 100 mL/hr over 30 Minutes Intravenous  Once 10/18/19 1510 10/18/19 1735         Subjective: His hemoglobin dropped to 6.8 this morning.  Discussed with nephrology and ordering 1 unit PRBC.  Plan for EGD today.  Objective: Vitals:   10/20/19 1000 10/20/19 1030 10/20/19 1140 10/20/19 1235  BP: (!) 89/52 (!) 92/35 (!) 97/39 99/64  Pulse: 82 80 90 93  Resp: 18 18 16 16   Temp:   98.4 F (36.9 C) 98.2 F (36.8 C)  TempSrc:   Axillary Oral  SpO2:   100% 100%  Weight:   91.1 kg   Height:   5\' 6"  (1.676 m)     Intake/Output Summary (Last 24 hours) at 10/20/2019 1459 Last data filed at 10/20/2019 1234 Gross per 24 hour  Intake 788.16 ml  Output 2500 ml  Net -1711.84 ml   Filed Weights   10/20/19 0422 10/20/19 0659 10/20/19 1140  Weight: 94 kg 91.1 kg 91.1 kg    Examination:  General exam: Appears calm and comfortable  Respiratory system: Decreased breath sounds lower lobes otherwise clear to auscultation. Respiratory effort normal. Cardiovascular system: S1 & S2 heard, RRR. No JVD, murmurs, rubs, gallops or clicks. No pedal edema. Gastrointestinal system: Abdomen is nondistended, soft and nontender. No masses felt. Normal bowel sounds heard. Central nervous system: Alert and oriented. No focal neurological deficits. Extremities: Bilateral lower extremity edema with erythema, worse on the  left lower extremity, he has a few scabbed lesions without any open wounds, mild tenderness to palpation especially on the left lower extremity.  Improving from previous. Skin: No rashes or ulcers Psychiatry: Judgement and insight appear normal. Mood & affect appropriate.     Data Reviewed: I have personally reviewed following labs and imaging studies  CBC: Recent Labs  Lab 10/18/19 1249 10/19/19 0511 10/20/19 0758  WBC 8.3 7.5 7.0  NEUTROABS 6.2  --   --   HGB 8.5* 7.2* 6.8*  HCT 26.3* 22.4* 21.3*  MCV 96.3 95.7 97.3  PLT 106* 90* 92*    Basic Metabolic Panel: Recent Labs  Lab 10/18/19 1249 10/19/19 0511 10/20/19 0758  NA 135 134* 134*  K 3.7 4.1 3.6  CL 94* 95* 96*  CO2 30 28 29   GLUCOSE 100* 113* 102*  BUN 24* 33* 25*  CREATININE 3.51* 3.97* 3.09*  CALCIUM 8.5* 8.2* 8.1*  PHOS  --   --  4.2    GFR: Estimated Creatinine Clearance: 17.8 mL/min (A) (by C-G formula based on SCr of 3.09 mg/dL (H)).  Liver Function Tests: Recent Labs  Lab 10/18/19 1249 10/19/19 0511 10/20/19 0758  AST 18 19  --   ALT 18 15  --   ALKPHOS 157* 131*  --   BILITOT 1.5* 1.2  --   PROT 6.2* 5.4*  --   ALBUMIN 2.6* 2.3* 2.2*    CBG: No results for input(s): GLUCAP in the last 168 hours.   Recent Results (from the past 240 hour(s))  Culture, blood (Routine x 2)     Status: None (Preliminary result)   Collection Time: 10/18/19 12:45 PM   Specimen: BLOOD  Result Value Ref Range Status   Specimen Description BLOOD RIGHT ANTECUBITAL  Final   Special Requests   Final    BOTTLES DRAWN AEROBIC AND ANAEROBIC Blood Culture adequate volume   Culture   Final    NO GROWTH 2 DAYS Performed at [facility], [address], [place], [place] [address]    Report Status PENDING  Incomplete  Culture, blood (Routine x 2)     Status: None (Preliminary result)   Collection Time: 10/18/19  1:03 PM   Specimen: BLOOD RIGHT HAND  Result Value Ref Range Status   Specimen Description BLOOD  RIGHT HAND  Final   Special Requests   Final    BOTTLES DRAWN AEROBIC AND ANAEROBIC Blood Culture results may not be optimal due to an inadequate volume of blood received in culture bottles   Culture   Final    NO GROWTH 2 DAYS Performed at [facility], [place] [address] [place], [place] [address]    Report Status PENDING  Incomplete  SARS Coronavirus 2 by RT PCR (hospital order, performed in [facility] hospital lab) Nasopharyngeal Nasopharyngeal Swab     Status: None   Collection Time: 10/18/19  5:17 PM   Specimen: Nasopharyngeal Swab  Result Value Ref Range Status   SARS Coronavirus 2 NEGATIVE NEGATIVE Final    Comment: (NOTE) SARS-CoV-2 target nucleic acids are NOT DETECTED. The SARS-CoV-2 RNA is generally detectable in upper and lower respiratory specimens during the acute phase of infection. The lowest concentration of SARS-CoV-2 viral copies this assay can detect is 250 copies / mL. A negative result does not preclude SARS-CoV-2 infection and should not be used as the sole basis for treatment or other patient management decisions.  A negative result may occur with improper specimen collection / handling, submission of specimen other than nasopharyngeal swab, presence of viral mutation(s) within the areas targeted by this assay, and inadequate number of viral copies (<250 copies / mL). A negative result must be combined with clinical observations, patient history, and epidemiological information. Fact Sheet for Patients:   StrictlyIdeas.no Fact Sheet for Healthcare Providers: BankingDealers.co.za This  test is not yet approved or cleared  by the [place] and has been authorized for detection and/or diagnosis of SARS-CoV-2 by FDA under an Emergency Use Authorization (EUA).  This EUA will remain in effect (meaning this test can be used) for the duration of the COVID-19 declaration under Section 564(b)(1) of the Act, 21  U.S.C. section 360bbb-3(b)(1), unless the authorization is terminated or revoked sooner. Performed at [facility], [place] [address] [place], [place] [address]          Radiology Studies: DG Chest Portable 1 View  Result Date: 10/18/2019 CLINICAL DATA:  Fluid overload EXAM: PORTABLE CHEST 1 VIEW COMPARISON:  Chest radiograph dated 09/01/2019 FINDINGS: A right internal jugular central venous catheter tip overlies the superior cavoatrial junction. A left subclavian approach cardiac device is redemonstrated. Median sternotomy wires are seen. The heart remains enlarged. Vascular calcifications are seen in the aortic arch. Both lungs are clear. The visualized skeletal structures are unremarkable. IMPRESSION: 1. No acute cardiopulmonary process. 2. Cardiomegaly. Electronically Signed   By: Zerita Boers M.D.   On: 10/18/2019 17:59   VAS [place] LOWER EXTREMITY VENOUS (DVT) ([facility] and [facility] 7a-7p)  Result Date: 10/18/2019  Lower Venous DVTStudy Indications: Edema.  Limitations: Body habitus and poor ultrasound/tissue interface. Comparison Study: 07/06/19 Performing Technologist: Abram Sander RVS  Examination Guidelines: A complete evaluation includes B-mode imaging, spectral Doppler, color Doppler, and power Doppler as needed of all accessible portions of each vessel. Bilateral testing is considered an integral part of a complete examination. Limited examinations for reoccurring indications may be performed as noted. The reflux portion of the exam is performed with the patient in reverse Trendelenburg.  +---------+---------------+---------+-----------+----------+------------------+ RIGHT    CompressibilityPhasicitySpontaneityPropertiesThrombus Aging     +---------+---------------+---------+-----------+----------+------------------+ CFV      Full           Yes      Yes                                     +---------+---------------+---------+-----------+----------+------------------+ SFJ      Full                                                             +---------+---------------+---------+-----------+----------+------------------+ FV Prox  Full                                                            +---------+---------------+---------+-----------+----------+------------------+ FV Mid   Full                                                            +---------+---------------+---------+-----------+----------+------------------+ FV DistalFull                                                            +---------+---------------+---------+-----------+----------+------------------+  PFV      Full                                                            +---------+---------------+---------+-----------+----------+------------------+ POP      Full           Yes      Yes                                     +---------+---------------+---------+-----------+----------+------------------+ PTV      Full                                         limited                                                                  visualization      +---------+---------------+---------+-----------+----------+------------------+ PERO                                                  Not visualized     +---------+---------------+---------+-----------+----------+------------------+   +---------+---------------+---------+-----------+----------+--------------+ LEFT     CompressibilityPhasicitySpontaneityPropertiesThrombus Aging +---------+---------------+---------+-----------+----------+--------------+ CFV      Full           Yes      Yes                                 +---------+---------------+---------+-----------+----------+--------------+ SFJ      Full                                                        +---------+---------------+---------+-----------+----------+--------------+ FV Prox  Full                                                         +---------+---------------+---------+-----------+----------+--------------+ FV Mid   Full                                                        +---------+---------------+---------+-----------+----------+--------------+ FV Distal               Yes      Yes                                 +---------+---------------+---------+-----------+----------+--------------+  PFV      Full                                                        +---------+---------------+---------+-----------+----------+--------------+ POP                     Yes      Yes                                 +---------+---------------+---------+-----------+----------+--------------+ PTV      Full                                                        +---------+---------------+---------+-----------+----------+--------------+ PERO                                                  Not visualized +---------+---------------+---------+-----------+----------+--------------+     Summary: BILATERAL: - No evidence of deep vein thrombosis seen in the lower extremities, bilaterally. -   *See table(s) above for measurements and observations. Electronically signed by Monica Martinez MD on 10/18/2019 at 5:36:01 PM.    Final         Scheduled Meds: . allopurinol  50 mg Oral QODAY  . amiodarone  200 mg Oral Daily  . Chlorhexidine Gluconate Cloth  6 each Topical Daily  . l-methylfolate-B6-B12  1 tablet Oral Daily  . midodrine  15 mg Oral TID  . multivitamin  1 tablet Oral QHS  . pantoprazole (PROTONIX) IV  40 mg Intravenous Q12H  . pramipexole  0.25 mg Oral BID  . rosuvastatin  10 mg Oral Daily  . sevelamer carbonate  800 mg Oral TID WC  . tamsulosin  0.4 mg Oral Daily   Continuous Infusions: .  ceFAZolin (ANCEF) IV 2 g (10/19/19 1739)     LOS: 2 days    Time spent: 65 min  Macil Crady, MD [facility]   To contact the attending provider between 7A-7P or the covering provider during  after hours 7P-7A, please log into the web site [email or web address] and access using universal [facility] password for that web site. If you do not have the password, please call the hospital operator.  10/20/2019, 2:59 PM

## 2019-10-20 NOTE — Anesthesia Preprocedure Evaluation (Deleted)
Anesthesia Evaluation  Patient identified by MRN, date of birth, ID band Patient awake    Reviewed: Allergy & Precautions, NPO status , Patient's Chart, lab work & pertinent test results  Airway Mallampati: II  TM Distance: >3 FB Neck ROM: Full    Dental  (+) Teeth Intact, Dental Advisory Given   Pulmonary shortness of breath, former smoker,    Pulmonary exam normal breath sounds clear to auscultation       Cardiovascular hypertension, + CAD, + CABG and +CHF  Normal cardiovascular exam+ dysrhythmias Atrial Fibrillation + Cardiac Defibrillator  Rhythm:Regular Rate:Normal  Echo 08/22/19: 1. Left ventricular ejection fraction, by estimation, is <20%. The left  ventricle has severely decreased function. The left ventricle demonstrates  global hypokinesis. The left ventricular internal cavity size was mildly  dilated.  2. Right ventricular systolic function is moderately reduced. The right  ventricular size is moderately enlarged. There is moderately elevated  pulmonary artery systolic pressure.  3. No ASD or PFO by color doppler. Left atrial size was moderately  dilated. No left atrial/left atrial appendage thrombus was detected.  4. Right atrial size was moderately dilated.  5. Peak RV-RA gradient 36 mmHg. Tricuspid valve regurgitation is severe.  6. Moderately calcified aortic valve with trivial regurgitation. There  was no significant aortic stenosis, mean gradient 8 mmHg.  7. Moderate mitral regurgitation with restricted posterior mitral  leaflet. . The mitral valve is abnormal. Moderate mitral valve  regurgitation.  8. Normal caliber thoracic aorta with grade 3 plaque.    Neuro/Psych CVA, No Residual Symptoms    GI/Hepatic Neg liver ROS, GIB    Endo/Other  negative endocrine ROSObesity   Renal/GU Dialysis and ESRFRenal disease     Musculoskeletal negative musculoskeletal ROS (+)   Abdominal   Peds  Hematology  (+) Blood dyscrasia (Eliquis; Thrombocytopenia), anemia ,   Anesthesia Other Findings Day of surgery medications reviewed with the patient.  Reproductive/Obstetrics                             Anesthesia Physical Anesthesia Plan  ASA: IV  Anesthesia Plan: MAC   Post-op Pain Management:    Induction: Intravenous  PONV Risk Score and Plan: 1 and Propofol infusion and Treatment may vary due to age or medical condition  Airway Management Planned: Nasal Cannula and Natural Airway  Additional Equipment:   Intra-op Plan:   Post-operative Plan:   Informed Consent: I have reviewed the patients History and Physical, chart, labs and discussed the procedure including the risks, benefits and alternatives for the proposed anesthesia with the patient or authorized representative who has indicated his/her understanding and acceptance.     Dental advisory given  Plan Discussed with: CRNA  Anesthesia Plan Comments: (Anemic, hypotensive, just returned from dialysis. Will require pRBC prior to proceeding with procedure.)      Anesthesia Quick Evaluation

## 2019-10-20 NOTE — Progress Notes (Signed)
Ricardo Payne  Subjective: no c/o this am, seen on HD  Vitals:   10/20/19 0800 10/20/19 0830 10/20/19 0900 10/20/19 0930  BP: (!) 93/44 (!) 89/43 (!) 90/53 (!) 105/44  Pulse: 83 77 77 78  Resp: 18 18 18 18   Temp:      TempSrc:      SpO2:      Weight:      Height:        Exam: Gen alert, no distress, elderly WM No jvd or bruits Chest clear bilat  RRR no MRG Abd soft ntnd no mass or ascites +bs GU normal male MS no joint effusions or deformity Ext bilat 1-2+ LE edema up to hips, mild erythema left lower Neuro is alert, Ox 3 , nf     Home meds:  - amiodarone 200/ eliquis 5 bid/ midodrine 15 tid/ crestor 10  - flomax 0.4/ allopurinol 50 qod  - renagel 1 ac tid  - pramipexole 0.25 bid  - prednisone 80 qd  - prn's/ vitamins/ supplements  CXR - no active  ECHO 07/2019 - LVEF < 20%   OP HD: MWFSat  4h  400/800   93kg  2/2.25  Hep none  TDC  RIJ  - mirc 50 q 2, last 5/17  - venofer 50/wk   Assessment/ Plan: 1. Gen weakness - multifactorial severe DCM, new anemia, esrd, deconditioning 2. Anemia/ guiac+ - per GI/ pmd, poss GIB. For endo today 3. ESRD - on HD MWFSat , 4x per wk. HD today.   4. Vol excess / LE edema - chronic issue, difficult to control w/ severe CM and low BP's. This is the reason for HD 4x/wk. Will likely be able to lower edw while he is here.  5. CM/ sp ICD 6. CAD sp CABG '90 7. PAF 8. H/o CVA 9. Anemia ckd - due to get esa on 5/24 10. MBD ckd - Ca++ ok, phos 4, cont renagel     Ricardo Payne 10/20/2019, 10:12 AM   Recent Labs  Lab 10/19/19 0511 10/20/19 0758  K 4.1 3.6  BUN 33* 25*  CREATININE 3.97* 3.09*  CALCIUM 8.2* 8.1*  PHOS  --  4.2  HGB 7.2* 6.8*   Inpatient medications: . sodium chloride   Intravenous Once  . allopurinol  50 mg Oral QODAY  . amiodarone  200 mg Oral Daily  . Chlorhexidine Gluconate Cloth  6 each Topical Daily  . l-methylfolate-B6-B12  1 tablet Oral Daily  . midodrine  15 mg  Oral TID  . multivitamin  1 tablet Oral QHS  . pantoprazole (PROTONIX) IV  40 mg Intravenous Q12H  . pramipexole  0.25 mg Oral BID  . rosuvastatin  10 mg Oral Daily  . sevelamer carbonate  800 mg Oral TID WC  . tamsulosin  0.4 mg Oral Daily   . sodium chloride 20 mL/hr at 10/19/19 2152  .  ceFAZolin (ANCEF) IV 2 g (10/19/19 1739)   acetaminophen

## 2019-10-21 ENCOUNTER — Encounter (HOSPITAL_COMMUNITY)
Admission: EM | Disposition: A | Discharge status: Home / Self Care | Payer: Self-pay | Source: Home / Self Care | Attending: Infectious Disease

## 2019-10-21 ENCOUNTER — Inpatient Hospital Stay (HOSPITAL_COMMUNITY): Payer: Medicare Other | Admitting: Certified Registered Nurse Anesthetist

## 2019-10-21 ENCOUNTER — Encounter (HOSPITAL_COMMUNITY): Payer: Self-pay | Admitting: Infectious Disease

## 2019-10-21 HISTORY — PX: ESOPHAGOGASTRODUODENOSCOPY (EGD) WITH PROPOFOL: SHX5813

## 2019-10-21 HISTORY — PX: ESOPHAGEAL BRUSHING: SHX6842

## 2019-10-21 LAB — BASIC METABOLIC PANEL
Anion gap: 10 (ref 5–15)
BUN: 16 mg/dL (ref 8–23)
CO2: 28 mmol/L (ref 22–32)
Calcium: 8.4 mg/dL — ABNORMAL LOW (ref 8.9–10.3)
Chloride: 97 mmol/L — ABNORMAL LOW (ref 98–111)
Creatinine, Ser: 2.58 mg/dL — ABNORMAL HIGH (ref 0.61–1.24)
GFR calc Af Amer: 25 mL/min — ABNORMAL LOW (ref >60)
GFR calc non Af Amer: 21 mL/min — ABNORMAL LOW (ref >60)
Glucose, Bld: 103 mg/dL — ABNORMAL HIGH (ref 70–99)
Potassium: 3.4 mmol/L — ABNORMAL LOW (ref 3.5–5.1)
Sodium: 135 mmol/L (ref 135–145)

## 2019-10-21 LAB — CBC
HCT: 26.7 % — ABNORMAL LOW (ref 39.0–52.0)
Hemoglobin: 8.7 g/dL — ABNORMAL LOW (ref 13.0–17.0)
MCH: 31.1 pg (ref 26.0–34.0)
MCHC: 32.6 g/dL (ref 30.0–36.0)
MCV: 95.4 fL (ref 80.0–100.0)
Platelets: 86 10*3/uL — ABNORMAL LOW (ref 150–400)
RBC: 2.8 MIL/uL — ABNORMAL LOW (ref 4.22–5.81)
RDW: 21.3 % — ABNORMAL HIGH (ref 11.5–15.5)
WBC: 6.7 10*3/uL (ref 4.0–10.5)
nRBC: 0.3 % — ABNORMAL HIGH (ref 0.0–0.2)

## 2019-10-21 LAB — BPAM RBC
Blood Product Expiration Date: 202106172359
ISSUE DATE / TIME: 202105221646
Unit Type and Rh: 5100

## 2019-10-21 LAB — TYPE AND SCREEN
ABO/RH(D): O POS
Antibody Screen: NEGATIVE
Unit division: 0

## 2019-10-21 SURGERY — ESOPHAGOGASTRODUODENOSCOPY (EGD) WITH PROPOFOL
Anesthesia: Monitor Anesthesia Care

## 2019-10-21 MED ORDER — SODIUM CHLORIDE 0.9 % IV SOLN: INTRAVENOUS | Status: DC | PRN

## 2019-10-21 MED ORDER — ETOMIDATE 2 MG/ML IV SOLN
INTRAVENOUS | Status: DC | PRN
  Administered 2019-10-21: 8 mg via INTRAVENOUS
  Administered 2019-10-21 (×3): 2 mg via INTRAVENOUS

## 2019-10-21 MED ORDER — PHENYLEPHRINE 40 MCG/ML (10ML) SYRINGE FOR IV PUSH (FOR BLOOD PRESSURE SUPPORT)
PREFILLED_SYRINGE | INTRAVENOUS | Status: DC | PRN
  Administered 2019-10-21 (×2): 80 ug via INTRAVENOUS

## 2019-10-21 MED ORDER — CHLORHEXIDINE GLUCONATE CLOTH 2 % EX PADS: 6.0000 | MEDICATED_PAD | Freq: Every day | CUTANEOUS | Status: DC

## 2019-10-21 MED ORDER — DARBEPOETIN ALFA 40 MCG/0.4ML IJ SOSY
40.0000 ug | PREFILLED_SYRINGE | INTRAMUSCULAR | Status: DC
  Filled 2019-10-21: qty 0.4

## 2019-10-21 SURGICAL SUPPLY — 15 items

## 2019-10-21 NOTE — Anesthesia Preprocedure Evaluation (Addendum)
Anesthesia Evaluation  Patient identified by MRN, date of birth, ID band Patient awake    Reviewed: Allergy & Precautions, NPO status , Patient's Chart, lab work & pertinent test results  Airway Mallampati: II   Neck ROM: Full    Dental  (+) Edentulous Upper, Edentulous Lower   Pulmonary former smoker,    breath sounds clear to auscultation       Cardiovascular hypertension,  Rhythm:Irregular     Neuro/Psych    GI/Hepatic   Endo/Other    Renal/GU      Musculoskeletal   Abdominal   Peds  Hematology   Anesthesia Other Findings   Reproductive/Obstetrics                             Anesthesia Physical Anesthesia Plan  ASA: III  Anesthesia Plan: MAC   Post-op Pain Management:    Induction: Intravenous  PONV Risk Score and Plan: Ondansetron  Airway Management Planned: Nasal Cannula and Natural Airway  Additional Equipment:   Intra-op Plan:   Post-operative Plan:   Informed Consent: I have reviewed the patients History and Physical, chart, labs and discussed the procedure including the risks, benefits and alternatives for the proposed anesthesia with the patient or authorized representative who has indicated his/her understanding and acceptance.       Plan Discussed with: CRNA and Anesthesiologist  Anesthesia Plan Comments:         Anesthesia Quick Evaluation

## 2019-10-21 NOTE — Transfer of Care (Signed)
Immediate Anesthesia Transfer of Care Note  Patient: Ricardo Payne  Procedure(s) Performed: ESOPHAGOGASTRODUODENOSCOPY (EGD) WITH PROPOFOL (N/A ) ESOPHAGEAL BRUSHING  Patient Location: Endoscopy Unit  Anesthesia Type:MAC  Level of Consciousness: drowsy  Airway & Oxygen Therapy: Patient Spontanous Breathing and Patient connected to nasal cannula oxygen  Post-op Assessment: Report given to RN and Post -op Vital signs reviewed and stable  Post vital signs: Reviewed and stable  Last Vitals:  Vitals Value Taken Time  BP 90/51 10/21/19 1208  Temp 36.6 C 10/21/19 1208  Pulse 87 10/21/19 1210  Resp 22 10/21/19 1210  SpO2 97 % 10/21/19 1210  Vitals shown include unvalidated device data.  Last Pain:  Vitals:   10/21/19 1208  TempSrc: Axillary  PainSc: Asleep      Patients Stated Pain Goal: 2 (37/04/88 8916)  Complications: No apparent anesthesia complications

## 2019-10-21 NOTE — Anesthesia Postprocedure Evaluation (Signed)
Anesthesia Post Note  Patient: Ricardo Payne  Procedure(s) Performed: ESOPHAGOGASTRODUODENOSCOPY (EGD) WITH PROPOFOL (N/A ) ESOPHAGEAL BRUSHING     Patient location during evaluation: PACU Anesthesia Type: MAC Level of consciousness: awake and alert Pain management: pain level controlled Vital Signs Assessment: post-procedure vital signs reviewed and stable Respiratory status: spontaneous breathing, nonlabored ventilation, respiratory function stable and patient connected to nasal cannula oxygen Cardiovascular status: stable and blood pressure returned to baseline Postop Assessment: no apparent nausea or vomiting Anesthetic complications: no    Last Vitals:  Vitals:   10/21/19 1221 10/21/19 1257  BP: (!) 91/42 (!) 96/55  Pulse: 79 83  Resp: (!) 21 16  Temp:  37 C  SpO2: 100% 98%    Last Pain:  Vitals:   10/21/19 1300  TempSrc:   PainSc: 0-No pain                 Mikal Blasdell COKER

## 2019-10-21 NOTE — Plan of Care (Signed)
  Problem: Education: Goal: Knowledge of General Education information will improve Description: Including pain rating scale, medication(s)/side effects and non-pharmacologic comfort measures Outcome: Progressing   Problem: Health Behavior/Discharge Planning: Goal: Ability to manage health-related needs will improve Outcome: Progressing   Problem: Clinical Measurements: Goal: Ability to maintain clinical measurements within normal limits will improve Outcome: Progressing   Problem: Nutrition: Goal: Adequate nutrition will be maintained Outcome: Progressing   Problem: Skin Integrity: Goal: Risk for impaired skin integrity will decrease Outcome: Progressing   

## 2019-10-21 NOTE — H&P (Signed)
The patient presents to the endoscopy department for EGD to evaluate anemia, melena and heme positive stool  Physical:  No distress  Heart regular rhythm  Lungs clear  Abdomen soft and nontender  Impression: Melena, anemia, heme positive stool  Plan: EGD

## 2019-10-21 NOTE — Progress Notes (Signed)
PROGRESS NOTE    Ricardo Payne  POE:[id number] DOB: [birth year] DOA: 10/18/2019 PCP: [name], FNP    Chief Complaint  Patient presents with  . Cellulitis    Brief Narrative:  Ricardo Payne is a 86 y.o. male with multiple medical problems significant of systolic and diastolic congestive failure, coronary disease, end-stage renal disease on dialysis Monday, Wednesday, Friday, Saturday, paroxysmal atrial fibrillation on chronic anticoagulation, ICD, hyperlipidemia, hypertension, history of CVA.  He presented to the emergency room because of worsening bilateral lower extremity swelling, erythema and pain of the left lower extremity which he says has been ongoing for the past couple of days with recent worsening.  Also complained of fatigue, hypotension, black-colored stool 10 days ago. Patient says he was started on doxycycline by his primary care physician which he took for maybe 2 days but he says it is not helping his lower extremity pain and cellulitis.    ED Course: Lab work in the ED showed hemoglobin 8.5.  His baseline is usually between 10 and 11.  His stool for occult blood was positive per the ER.  He was found to be hypotensive with blood pressure 90/47.   Assessment & Plan:   Principal Problem:   Hypotension Active Problems:   Cellulitis of leg   GI bleed   ESRD on dialysis (HCC)   Chronic combined systolic and diastolic CHF (congestive heart failure) (HCC)   Thrombocytopenia (HCC)   Blood thinned due to long-term anticoagulant use  Hypotension: Patient has chronic hypotension but now with worsening likely secondary to GI bleed, cellulitis.  His hemoglobin dropped to 6.8 this morning.  He received 250 cc bolus x2 for hypotension. Discussed with nephrology and ordered 1 unit PRBC.  Hemoglobin this morning 8.7.  Continue to monitor.  Cellulitis of lower extremity: Patient has chronic bilateral lower extremity edema, also recently has been having erythema and  tenderness worse in his left lower extremity. No open lesions noted at this time.  He says he was prescribed doxycycline which he took maybe for a day or so but not helping and were sent.  Improving with IV Ancef.  Continue to monitor closely.  Denies having any fevers or chills.  GI bleed: Patient was found to have dropped his hemoglobin from 10.2-8.5 in the past week or so.  He had dark stools 10 days ago. He is on chronic anticoagulation with Eliquis due to paroxysmal atrial fibrillation.  Holding Eliquis at this time.  Yesterday his hemoglobin dropped to 6.8.   Improved today to 8.7 with 1 unit PRBC.  Consulted Gastroenterology. Likely EGD today. Continue to monitor hemoglobin. On IV PPI twice daily.  He denies having any abdominal pain, nausea or vomiting at this time.  ESRD on hemodialysis: Dialysis as per nephrology.  Patient gets dialyzed Monday, Wednesday, Friday, Saturday. Continue home medications.   Chronic combined systolic/diastolic and heart failure: Continue home medications.  Continue to monitor.  Thrombocytopenia: Likely has chronic thrombocytopenia?  Etiology unclear.  Continue to monitor platelets.  Paroxysmal atrial fibrillation: Continue amiodarone.  He is currently in sinus rhythm.  Discussed with cardiology regarding his anticoagulation.  He will need to be monitored on telemetry.  Per cardiology consult them if he flips into atrial fibrillation at that time he may need heparin.  DVT prophylaxis: SCD's given GI bleed Code Status:              Partial code Family Communication:  No family at bedside Disposition Plan:  Patient is from:                        Home             Anticipated DC to:                   Home             Anticipated DC date:               3- 4 days             Anticipated DC barriers:         Pending further work-up for GI bleed  Admission status:     Inpatient              Patient currently is not medically stable to d/c.      Consultants:   Gastroenterology  Nephrology   Procedures:   Likely EGD today   Antimicrobials:  Anti-infectives (From admission, onward)   Start     Dose/Rate Route Frequency Ordered Stop   10/19/19 1800  ceFAZolin (ANCEF) IVPB 2g/100 mL premix     2 g 200 mL/hr over 30 Minutes Intravenous Once per day on Mon Wed Fri Sat 10/19/19 1619     10/19/19 1500  ceFAZolin (ANCEF) IVPB 1 g/50 mL premix  Status:  Discontinued     1 g 100 mL/hr over 30 Minutes Intravenous Every 24 hours 10/18/19 1758 10/19/19 1618   10/18/19 1515  ceFAZolin (ANCEF) IVPB 1 g/50 mL premix     1 g 100 mL/hr over 30 Minutes Intravenous  Once 10/18/19 1510 10/18/19 1735         Subjective: His hemoglobin dropped to 6.8 yesterday.  Improved to 8.7 with 1 unit PRBC.  However, blood pressure remains on the lower side.  Plan for EGD today.  Objective: Vitals:   10/21/19 1010 10/21/19 1208 10/21/19 1221 10/21/19 1257  BP: (!) 105/50 (!) 90/51 (!) 91/42 (!) 96/55  Pulse: 84 78 79 83  Resp: 18 (!) 24 (!) 21 16  Temp: 97.7 F (36.5 C) 97.9 F (36.6 C)  98.6 F (37 C)  TempSrc: Oral Axillary  Oral  SpO2: 100% 99% 100% 98%  Weight:      Height:        Intake/Output Summary (Last 24 hours) at 10/21/2019 1539 Last data filed at 10/21/2019 1201 Gross per 24 hour  Intake 1149.87 ml  Output --  Net 1149.87 ml   Filed Weights   10/20/19 0659 10/20/19 1140 10/21/19 0700  Weight: 91.1 kg 91.1 kg 90.6 kg    Examination:  General exam: Appears calm and comfortable  Respiratory system: Decreased breath sounds lower lobes otherwise clear to auscultation. Respiratory effort normal. Cardiovascular system: S1 & S2 heard, RRR. No JVD, murmurs, rubs, gallops or clicks. No pedal edema. Gastrointestinal system: Abdomen is nondistended, soft and nontender. No masses felt. Normal bowel sounds heard. Central nervous system: Alert and oriented. No focal neurological deficits. Extremities: Bilateral lower extremity  edema with erythema, worse on the left lower extremity, he has a few scabbed lesions without any open wounds, mild tenderness to palpation especially on the left lower extremity.  Improving. Skin: No rashes or ulcers Psychiatry: Judgement and insight appear normal. Mood & affect appropriate.     Data Reviewed: I have personally reviewed following labs and imaging studies  CBC: Recent Labs  Lab 10/18/19 1249 10/19/19 0511 10/20/19  0321 10/21/19 0805  WBC 8.3 7.5 7.0 6.7  NEUTROABS 6.2  --   --   --   HGB 8.5* 7.2* 6.8* 8.7*  HCT 26.3* 22.4* 21.3* 26.7*  MCV 96.3 95.7 97.3 95.4  PLT 106* 90* 92* 86*    Basic Metabolic Panel: Recent Labs  Lab 10/18/19 1249 10/19/19 0511 10/20/19 0758 10/21/19 0805  NA 135 134* 134* 135  K 3.7 4.1 3.6 3.4*  CL 94* 95* 96* 97*  CO2 30 28 29 28   GLUCOSE 100* 113* 102* 103*  BUN 24* 33* 25* 16  CREATININE 3.51* 3.97* 3.09* 2.58*  CALCIUM 8.5* 8.2* 8.1* 8.4*  PHOS  --   --  4.2  --     GFR: Estimated Creatinine Clearance: 21.3 mL/min (A) (by C-G formula based on SCr of 2.58 mg/dL (H)).  Liver Function Tests: Recent Labs  Lab 10/18/19 1249 10/19/19 0511 10/20/19 0758  AST 18 19  --   ALT 18 15  --   ALKPHOS 157* 131*  --   BILITOT 1.5* 1.2  --   PROT 6.2* 5.4*  --   ALBUMIN 2.6* 2.3* 2.2*    CBG: No results for input(s): GLUCAP in the last 168 hours.   Recent Results (from the past 240 hour(s))  Culture, blood (Routine x 2)     Status: None (Preliminary result)   Collection Time: 10/18/19 12:45 PM   Specimen: BLOOD  Result Value Ref Range Status   Specimen Description BLOOD RIGHT ANTECUBITAL  Final   Special Requests   Final    BOTTLES DRAWN AEROBIC AND ANAEROBIC Blood Culture adequate volume   Culture   Final    NO GROWTH 3 DAYS Performed at [facility], [address], [place],  [address]    Report Status PENDING  Incomplete  Culture, blood (Routine x 2)     Status: None (Preliminary result)    Collection Time: 10/18/19  1:03 PM   Specimen: BLOOD RIGHT HAND  Result Value Ref Range Status   Specimen Description BLOOD RIGHT HAND  Final   Special Requests   Final    BOTTLES DRAWN AEROBIC AND ANAEROBIC Blood Culture results may not be optimal due to an inadequate volume of blood received in culture bottles   Culture   Final    NO GROWTH 3 DAYS Performed at [facility], [place] [address] [place],  [address]    Report Status PENDING  Incomplete  SARS Coronavirus 2 by RT PCR (hospital order, performed in [facility] hospital lab) Nasopharyngeal Nasopharyngeal Swab     Status: None   Collection Time: 10/18/19  5:17 PM   Specimen: Nasopharyngeal Swab  Result Value Ref Range Status   SARS Coronavirus 2 NEGATIVE NEGATIVE Final    Comment: (NOTE) SARS-CoV-2 target nucleic acids are NOT DETECTED. The SARS-CoV-2 RNA is generally detectable in upper and lower respiratory specimens during the acute phase of infection. The lowest concentration of SARS-CoV-2 viral copies this assay can detect is 250 copies / mL. A negative result does not preclude SARS-CoV-2 infection and should not be used as the sole basis for treatment or other patient management decisions.  A negative result may occur with improper specimen collection / handling, submission of specimen other than nasopharyngeal swab, presence of viral mutation(s) within the areas targeted by this assay, and inadequate number of viral copies (<250 copies / mL). A negative result must be combined with clinical observations, patient history, and epidemiological information. Fact  Sheet for Patients:   StrictlyIdeas.no Fact Sheet for Healthcare Providers: BankingDealers.co.za This test is not yet approved or cleared  by the [place] FDA and has been authorized for detection and/or diagnosis of SARS-CoV-2 by FDA under an Emergency Use Authorization (EUA).  This EUA will  remain in effect (meaning this test can be used) for the duration of the COVID-19 declaration under Section 564(b)(1) of the Act, 21 U.S.C. section 360bbb-3(b)(1), unless the authorization is terminated or revoked sooner. Performed at [facility], [place] [address], [place], [place] [address]          Radiology Studies: No results found.      Scheduled Meds: . allopurinol  50 mg Oral QODAY  . amiodarone  200 mg Oral Daily  . Chlorhexidine Gluconate Cloth  6 each Topical Daily  . [START ON 10/22/2019] Chlorhexidine Gluconate Cloth  6 each Topical Q0600  . [START ON 10/22/2019] darbepoetin (ARANESP) injection - DIALYSIS  40 mcg Intravenous Q Mon-HD  . l-methylfolate-B6-B12  1 tablet Oral Daily  . midodrine  15 mg Oral TID  . multivitamin  1 tablet Oral QHS  . pantoprazole (PROTONIX) IV  40 mg Intravenous Q12H  . pramipexole  0.25 mg Oral BID  . rosuvastatin  10 mg Oral Daily  . sevelamer carbonate  800 mg Oral TID WC  . tamsulosin  0.4 mg Oral Daily   Continuous Infusions: .  ceFAZolin (ANCEF) IV 2 g (10/20/19 2023)     LOS: 3 days    Time spent: 25 min  Yaakov Guthrie, MD [facility]   To contact the attending provider between 7A-7P or the covering provider during after hours 7P-7A, please log into the web site [email or web address] and access using universal [place] password for that web site. If you do not have the password, please call the hospital operator.  10/21/2019, 3:39 PM

## 2019-10-21 NOTE — Progress Notes (Addendum)
[facility] Progress Note  Subjective: 3 L off yest w/ HD, pt feeling a lot better, LE edema much improved and able to move legs more.  Worried about low BP's , symptomatic sometimes but not right now  Vitals:   10/21/19 0705 10/21/19 1010 10/21/19 1208 10/21/19 1221  BP: (!) 99/49 (!) 105/50 (!) 90/51 (!) 91/42  Pulse: 84 84 78 79  Resp:  18 (!) 24 (!) 21  Temp:  97.7 F (36.5 C) 97.9 F (36.6 C)   TempSrc:  Oral Axillary   SpO2:  100% 99% 100%  Weight:      Height:        Exam: Gen alert, no distress, elderly WM No jvd or bruits Chest clear bilat  RRR no MRG Abd soft ntnd no mass or ascites +bs GU normal male MS no joint effusions or deformity Ext bilat pretib edema improved mild-mod now, mild erythema left lower Neuro is alert, Ox 3 , nf     Home meds:  - amiodarone 200/ eliquis 5 bid/ midodrine 15 tid/ crestor 10  - flomax 0.4/ allopurinol 50 qod  - renagel 1 ac tid  - pramipexole 0.25 bid  - prednisone 80 qd  - prn's/ vitamins/ supplements  CXR - no active  ECHO 07/2019 - LVEF < 20%   OP HD: MWFSat  4h  400/800   93kg  2/2.25  Hep none  TDC  RIJ  - mirc 50 q 2, last 5/17  - venofer 50/wk   Assessment/ Plan: 1. Gen weakness - multifactorial severe DCM, new anemia, esrd, deconditioning 2. Anemia/ guiac+ - per GI/ pmd, poss GIB. For endo today 3. ESRD - on HD MWFSat , 4x per wk. Started dialysis on 08/24/19. VVS placed tunneled dialysis catheter, no plan for permanent access because of multiple comorbidities and technical difficulties.  HD monday.   4. Vol excess / LE edema - chronic issue, difficult to control w/ severe CM and low BP's. This is the reason for HD 4x/wk.  Down 4-5 kg here, leg edema better. May not be able to totally control edema due to hypotension. But doing better.   5. CM/ sp ICD 6. CAD sp CABG '90 7. PAF 8. H/o CVA 9. Anemia ckd - due to get esa on 5/24, will order 10. MBD ckd - Ca++ ok, phos 4, cont  renagel     Rob Dannette Kinkaid 10/21/2019, 12:37 PM   Recent Labs  Lab 10/20/19 0758 10/21/19 0805  K 3.6 3.4*  BUN 25* 16  CREATININE 3.09* 2.58*  CALCIUM 8.1* 8.4*  PHOS 4.2  --   HGB 6.8* 8.7*   Inpatient medications: . [MAR Hold] allopurinol  50 mg Oral QODAY  . [MAR Hold] amiodarone  200 mg Oral Daily  . [MAR Hold] Chlorhexidine Gluconate Cloth  6 each Topical Daily  . [MAR Hold] l-methylfolate-B6-B12  1 tablet Oral Daily  . [MAR Hold] midodrine  15 mg Oral TID  . [MAR Hold] multivitamin  1 tablet Oral QHS  . [MAR Hold] pantoprazole (PROTONIX) IV  40 mg Intravenous Q12H  . [MAR Hold] pramipexole  0.25 mg Oral BID  . [MAR Hold] rosuvastatin  10 mg Oral Daily  . [MAR Hold] sevelamer carbonate  800 mg Oral TID WC  . [MAR Hold] tamsulosin  0.4 mg Oral Daily   . [MAR Hold]  ceFAZolin (ANCEF) IV 2 g (10/20/19 2023)   [MAR Hold] acetaminophen, [MAR Hold] docusate sodium

## 2019-10-21 NOTE — Op Note (Signed)
[facility] Patient Name: Ricardo Payne Procedure Date : 10/21/2019 MRN: [id number] Attending MD: Wonda Horner , MD Date of Birth: 1930-09-23 CSN: [id number] Age: 86 Admit Type: Inpatient Procedure:                Upper GI endoscopy Indications:              Iron deficiency anemia secondary to chronic blood                            loss, Melena Providers:                Wonda Horner, MD, Josie Dixon, RN, Janeece Agee, Technician Referring MD:              Medicines:                See the Anesthesia note for documentation of the                            administered medications Complications:            No immediate complications. Estimated Blood Loss:     Estimated blood loss: none. Procedure:                Pre-Anesthesia Assessment:                           - Prior to the procedure, a History and Physical                            was performed, and patient medications and                            allergies were reviewed. The patient's tolerance of                            previous anesthesia was also reviewed. The risks                            and benefits of the procedure and the sedation                            options and risks were discussed with the patient.                            All questions were answered, and informed consent                            was obtained. Prior Anticoagulants: The patient has                            taken Eliquis (apixaban), last dose was 3 days  prior to procedure. ASA Grade Assessment: III - A                            patient with severe systemic disease. After                            reviewing the risks and benefits, the patient was                            deemed in satisfactory condition to undergo the                            procedure.                           After obtaining informed consent, the endoscope was           passed under direct vision. Throughout the                            procedure, the patient's blood pressure, pulse, and                            oxygen saturations were monitored continuously. The                            [id number]-H190 ([id number] Olympus gastroscope was                            introduced through the mouth, and advanced to the                            second part of duodenum. The upper GI endoscopy was                            accomplished without difficulty. The patient                            tolerated the procedure well. Scope In: Scope Out: Findings:      , white plaques were found in the middle third of the esophagus and in       the lower third of the esophagus. Brushed      Multiple petechiae were found in the cardia, in the gastric fundus and       in the gastric body.      The examined duodenum was normal.      No active bleeding.      It is possible that he had some oozing from petechiae, but nothing now Impression:               - Esophageal plaques were found, suspicious for                            candidiasis.                           - Gastric petechia(e).                           -  Normal examined duodenum.                           - No specimens collected. Recommendation:           - Resume regular diet.                           - Continue present medications.                           - OK to resume Eliquis. Would keep him on acid                            supression with either a PPI or H2 blocker to                            protect stomach. Procedure Code(s):        --- Professional ---                           [phone], Esophagogastroduodenoscopy, flexible,                            transoral; diagnostic, including collection of                            specimen(s) by brushing or washing, when performed                            (separate procedure) Diagnosis Code(s):        --- Professional ---                            K22.9, Disease of esophagus, unspecified                           K31.89, Other diseases of stomach and duodenum                           D50.0, Iron deficiency anemia secondary to blood                            loss (chronic)                           K92.1, Melena (includes Hematochezia) CPT copyright 2019 American Medical Association. All rights reserved. The codes documented in this report are preliminary and upon coder review may  be revised to meet current compliance requirements. Wonda Horner, MD 10/21/2019 12:22:31 PM This report has been signed electronically. Number of Addenda: 0

## 2019-10-21 NOTE — Progress Notes (Signed)
Pharmacy Antibiotic Note Ricardo Payne is a 86 y.o. male admitted on 10/18/2019 with cellulitis. Pharmacy has been consulted for cefazolin dosing. Patient is ESRD on HD-MWFSat for now. Will dose cefazolin with HD.  He was started on doxycycline for his cellulitis PTA and it did not appear to be helping after 1-2 days.  Today is day #4 of inpatient antibiotic therapy. WBC remains WNL and the pt is afebrile. No open lesions at the site and MD notes improvement.   Plan: Continue cefazolin 2g IV MWFSat after HD F/u clinical progress and LOT   Height: 5\' 6"  (167.6 cm) Weight: 90.6 kg (199 lb 12.8 oz) IBW/kg (Calculated) : 63.8  Temp (24hrs), Avg:98.4 F (36.9 C), Min:97.9 F (36.6 C), Max:98.8 F (37.1 C)  Recent Labs  Lab 10/18/19 1249 10/18/19 1504 10/19/19 0511 10/20/19 0758  WBC 8.3  --  7.5 7.0  CREATININE 3.51*  --  3.97* 3.09*  LATICACIDVEN 2.0* 1.7  --   --     Estimated Creatinine Clearance: 17.7 mL/min (A) (by C-G formula based on SCr of 3.09 mg/dL (H)).    No Known Allergies  Antimicrobials this admission: Cefazolin 5/20 >>  Microbiology results: 5/20 BCx x2: ngtd 5/20 COVID: negative  Kennon Holter, PharmD PGY1 Ambulatory Care Pharmacy Resident 10/21/19      7:33 AM  Please check AMION for all [place] phone numbers After 10:00 PM, call the [facility] [phone]

## 2019-10-22 LAB — CBC
HCT: 25.3 % — ABNORMAL LOW (ref 39.0–52.0)
HCT: 24.9 % — ABNORMAL LOW (ref 39.0–52.0)
Hemoglobin: 8.1 g/dL — ABNORMAL LOW (ref 13.0–17.0)
Hemoglobin: 7.9 g/dL — ABNORMAL LOW (ref 13.0–17.0)
MCH: 30.9 pg (ref 26.0–34.0)
MCH: 30.9 pg (ref 26.0–34.0)
MCHC: 32 g/dL (ref 30.0–36.0)
MCHC: 31.7 g/dL (ref 30.0–36.0)
MCV: 96.6 fL (ref 80.0–100.0)
MCV: 97.3 fL (ref 80.0–100.0)
Platelets: 90 10*3/uL — ABNORMAL LOW (ref 150–400)
Platelets: 91 10*3/uL — ABNORMAL LOW (ref 150–400)
RBC: 2.62 MIL/uL — ABNORMAL LOW (ref 4.22–5.81)
RBC: 2.56 MIL/uL — ABNORMAL LOW (ref 4.22–5.81)
RDW: 21.6 % — ABNORMAL HIGH (ref 11.5–15.5)
RDW: 21.8 % — ABNORMAL HIGH (ref 11.5–15.5)
WBC: 8.3 10*3/uL (ref 4.0–10.5)
WBC: 8 10*3/uL (ref 4.0–10.5)
nRBC: 0.2 % (ref 0.0–0.2)
nRBC: 0 % (ref 0.0–0.2)

## 2019-10-22 LAB — BASIC METABOLIC PANEL
Anion gap: 11 (ref 5–15)
Anion gap: 11 (ref 5–15)
BUN: 25 mg/dL — ABNORMAL HIGH (ref 8–23)
BUN: 27 mg/dL — ABNORMAL HIGH (ref 8–23)
CO2: 25 mmol/L (ref 22–32)
CO2: 26 mmol/L (ref 22–32)
Calcium: 8.3 mg/dL — ABNORMAL LOW (ref 8.9–10.3)
Calcium: 8.4 mg/dL — ABNORMAL LOW (ref 8.9–10.3)
Chloride: 97 mmol/L — ABNORMAL LOW (ref 98–111)
Chloride: 96 mmol/L — ABNORMAL LOW (ref 98–111)
Creatinine, Ser: 3.68 mg/dL — ABNORMAL HIGH (ref 0.61–1.24)
Creatinine, Ser: 3.89 mg/dL — ABNORMAL HIGH (ref 0.61–1.24)
GFR calc Af Amer: 16 mL/min — ABNORMAL LOW (ref >60)
GFR calc Af Amer: 15 mL/min — ABNORMAL LOW (ref >60)
GFR calc non Af Amer: 14 mL/min — ABNORMAL LOW (ref >60)
GFR calc non Af Amer: 13 mL/min — ABNORMAL LOW (ref >60)
Glucose, Bld: 115 mg/dL — ABNORMAL HIGH (ref 70–99)
Glucose, Bld: 127 mg/dL — ABNORMAL HIGH (ref 70–99)
Potassium: 3.4 mmol/L — ABNORMAL LOW (ref 3.5–5.1)
Potassium: 4.1 mmol/L (ref 3.5–5.1)
Sodium: 133 mmol/L — ABNORMAL LOW (ref 135–145)
Sodium: 133 mmol/L — ABNORMAL LOW (ref 135–145)

## 2019-10-22 MED ORDER — APIXABAN 5 MG PO TABS
5.0000 mg | ORAL_TABLET | Freq: Two times a day (BID) | ORAL | Status: DC
  Administered 2019-10-22: 5 mg via ORAL
  Filled 2019-10-22: qty 1

## 2019-10-22 MED ORDER — FLUCONAZOLE 100 MG PO TABS
100.0000 mg | ORAL_TABLET | ORAL | Status: DC
  Filled 2019-10-22: qty 1

## 2019-10-22 MED ORDER — ACETAMINOPHEN 500 MG PO TABS
ORAL_TABLET | ORAL | Status: AC
  Filled 2019-10-22: qty 2

## 2019-10-22 MED ORDER — DARBEPOETIN ALFA 60 MCG/0.3ML IJ SOSY
PREFILLED_SYRINGE | INTRAMUSCULAR | Status: AC
  Administered 2019-10-22: 60 ug
  Filled 2019-10-22: qty 0.3

## 2019-10-22 MED ORDER — PANTOPRAZOLE SODIUM 40 MG PO TBEC
40.0000 mg | DELAYED_RELEASE_TABLET | Freq: Two times a day (BID) | ORAL | Status: DC
  Administered 2019-10-23 (×2): 40 mg via ORAL
  Filled 2019-10-22 (×2): qty 1

## 2019-10-22 MED ORDER — APIXABAN 2.5 MG PO TABS
2.5000 mg | ORAL_TABLET | Freq: Two times a day (BID) | ORAL | Status: DC
  Administered 2019-10-23 (×2): 2.5 mg via ORAL
  Filled 2019-10-22 (×2): qty 1

## 2019-10-22 MED ORDER — FLUCONAZOLE 200 MG PO TABS
200.0000 mg | ORAL_TABLET | Freq: Once | ORAL | Status: AC
  Administered 2019-10-22: 200 mg via ORAL
  Filled 2019-10-22: qty 1

## 2019-10-22 MED ORDER — HEPARIN SODIUM (PORCINE) 1000 UNIT/ML IJ SOLN
INTRAMUSCULAR | Status: AC
  Administered 2019-10-22: 3200 [IU]
  Filled 2019-10-22: qty 4

## 2019-10-22 MED ORDER — AQUAPHOR EX OINT
TOPICAL_OINTMENT | Freq: Two times a day (BID) | CUTANEOUS | Status: DC
  Administered 2019-10-23: 1 via TOPICAL
  Filled 2019-10-22: qty 50

## 2019-10-22 MED ORDER — FLUCONAZOLE 50 MG PO TABS
50.0000 mg | ORAL_TABLET | ORAL | Status: DC
  Administered 2019-10-23: 50 mg via ORAL
  Filled 2019-10-22: qty 1

## 2019-10-22 NOTE — Progress Notes (Signed)
PROGRESS NOTE    Ricardo Payne  KTG:[id number] DOB: April 20, 1931 DOA: 10/18/2019 PCP: Ricardo Loader, FNP    Chief Complaint  Patient presents with  . Cellulitis    Brief Narrative:  Ricardo Payne is a 86 y.o. male with multiple medical problems significant of systolic and diastolic congestive failure, coronary disease, end-stage renal disease on dialysis Monday, Wednesday, Friday, Saturday, paroxysmal atrial fibrillation on chronic anticoagulation, ICD, hyperlipidemia, hypertension, history of CVA.  He presented to the emergency room because of worsening bilateral lower extremity swelling, erythema and pain of the left lower extremity which he says has been ongoing for the past couple of days with recent worsening.  Also complained of fatigue, hypotension, black-colored stool 10 days ago. Patient says he was started on doxycycline by his primary care physician which he took for maybe 2 days but he says it is not helping his lower extremity pain and cellulitis.    ED Course: Lab work in the ED showed hemoglobin 8.5.  His baseline is usually between 10 and 11.  His stool for occult blood was positive per the ER.  He was found to be hypotensive with blood pressure 90/47.   Assessment & Plan:   Principal Problem:   Hypotension Active Problems:   Cellulitis of leg   GI bleed   ESRD on dialysis (HCC)   Chronic combined systolic and diastolic CHF (congestive heart failure) (HCC)   Thrombocytopenia (HCC)   Blood thinned due to long-term anticoagulant use  Hypotension: Patient has chronic hypotension but now with worsening likely secondary to GI bleed, cellulitis.  His hemoglobin dropped as low as 6.8.  He received 250 cc bolus x2 for hypotension. Came up to 8.1 yesterday.  This morning again 7.9.  Continue to monitor blood pressure and hemoglobin.  Cellulitis of lower extremity: Patient has chronic bilateral lower extremity edema, also recently has been having erythema and  tenderness worse in his left lower extremity. No has some scabs on his lower extremities but open lesions noted at this time.  Failed outpatient doxycycline. Improving with IV Ancef.  -He also has extremely dry feet ordered Aquaphor.  Continue to monitor closely.  Denies having any fevers or chills.  GI bleed: Patient was found to have dropped his hemoglobin from 10.2-8.5 in the past week or so.  He had dark stools 10 days ago. He is on chronic anticoagulation with Eliquis due to paroxysmal atrial fibrillation.  Holding Eliquis at this time.  His hemoglobin dropped as low as 6.8.   Improved with 1 unit PRBC.  Consulted Gastroenterology.  He is status post EGD which showed white plaques suspicious for candidiasis, gastric petechiae.  Per GI okay to resume Eliquis. On IV PPI twice daily.  He denies having any abdominal pain, nausea or vomiting at this time.  Monitor hemoglobin.  Esophageal candidiasis: Start him on fluconazole and plan to treat for duration of 10-14 days.  ESRD on hemodialysis: Dialysis as per nephrology.  Patient gets dialyzed Monday, Wednesday, Friday, Saturday. Continue home medications.   Chronic combined systolic/diastolic and heart failure: Continue home medications.  Continue to monitor.  Thrombocytopenia: Likely has chronic thrombocytopenia?  Etiology unclear.  Continue to monitor platelets.  Paroxysmal atrial fibrillation: Continue amiodarone.  He is currently in sinus rhythm.  Restarted on Eliquis.  DVT prophylaxis: SCD's given GI bleed Code Status:              Partial code Family Communication:  No family at bedside Disposition Plan:  Patient is from:                        Home             Anticipated DC to:                   Home             Anticipated DC date:               3- 4 days             Anticipated DC barriers:         Pending further work-up for GI bleed  Admission status:     Inpatient              Patient currently is not medically  stable to d/c.    Consultants:   Gastroenterology  Nephrology   Procedures:   EGD   Antimicrobials:  Anti-infectives (From admission, onward)   Start     Dose/Rate Route Frequency Ordered Stop   10/23/19 1800  fluconazole (DIFLUCAN) tablet 50 mg     50 mg Oral Once per day on Sun Tue Thu 10/22/19 1018     10/22/19 1800  fluconazole (DIFLUCAN) tablet 100 mg     100 mg Oral Once per day on Mon Wed Fri Sat 10/22/19 1018     10/22/19 1100  fluconazole (DIFLUCAN) tablet 200 mg     200 mg Oral  Once 10/22/19 1018 10/22/19 1145   10/19/19 1800  ceFAZolin (ANCEF) IVPB 2g/100 mL premix     2 g 200 mL/hr over 30 Minutes Intravenous Once per day on Mon Wed Fri Sat 10/19/19 1619     10/19/19 1500  ceFAZolin (ANCEF) IVPB 1 g/50 mL premix  Status:  Discontinued     1 g 100 mL/hr over 30 Minutes Intravenous Every 24 hours 10/18/19 1758 10/19/19 1618   10/18/19 1515  ceFAZolin (ANCEF) IVPB 1 g/50 mL premix     1 g 100 mL/hr over 30 Minutes Intravenous  Once 10/18/19 1510 10/18/19 1735         Subjective: Denies having abdominal pain or blood in stool or change in the stool color.   Objective: Vitals:   10/21/19 2004 10/22/19 0257 10/22/19 0515 10/22/19 0849  BP: (!) 91/57  98/63 (!) 99/54  Pulse: 90  86 78  Resp: 20  20 17   Temp: 99.3 F (37.4 C)  98.3 F (36.8 C) 98.3 F (36.8 C)  TempSrc: Oral  Oral Oral  SpO2: 99%  99% 100%  Weight:  91 kg    Height:        Intake/Output Summary (Last 24 hours) at 10/22/2019 1252 Last data filed at 10/22/2019 0814 Gross per 24 hour  Intake 240 ml  Output 25 ml  Net 215 ml   Filed Weights   10/20/19 1140 10/21/19 0700 10/22/19 0257  Weight: 91.1 kg 90.6 kg 91 kg    Examination:  General exam: Appears calm and comfortable  Respiratory system: Decreased breath sounds lower lobes otherwise clear to auscultation. Respiratory effort normal. Cardiovascular system: S1 & S2 heard, RRR. No JVD, murmurs, rubs, gallops or clicks. No pedal  edema. Gastrointestinal system: Obese male, soft and nontender. No masses felt. Normal bowel sounds heard. Central nervous system: Alert and oriented. No focal neurological deficits. Extremities: Bilateral lower extremity edema with erythema, worse on the left lower extremity, he has  a few scabbed lesions without any open wounds, mild tenderness to palpation especially on the left lower extremity.  Improving. Skin: No new rashes or ulcers Psychiatry: Judgement and insight appear normal. Mood & affect appropriate.     Data Reviewed: I have personally reviewed following labs and imaging studies  CBC: Recent Labs  Lab 10/18/19 1249 10/18/19 1249 10/19/19 0511 10/20/19 0758 10/21/19 0805 10/22/19 0701 10/22/19 1125  WBC 8.3   < > 7.5 7.0 6.7 8.3 8.0  NEUTROABS 6.2  --   --   --   --   --   --   HGB 8.5*   < > 7.2* 6.8* 8.7* 8.1* 7.9*  HCT 26.3*   < > 22.4* 21.3* 26.7* 25.3* 24.9*  MCV 96.3   < > 95.7 97.3 95.4 96.6 97.3  PLT 106*   < > 90* 92* 86* 90* 91*   < > = values in this interval not displayed.    Basic Metabolic Panel: Recent Labs  Lab 10/19/19 0511 10/20/19 0758 10/21/19 0805 10/22/19 0701 10/22/19 1125  NA 134* 134* 135 133* 133*  K 4.1 3.6 3.4* 3.4* 4.1  CL 95* 96* 97* 97* 96*  CO2 28 29 28 25 26   GLUCOSE 113* 102* 103* 115* 127*  BUN 33* 25* 16 25* 27*  CREATININE 3.97* 3.09* 2.58* 3.68* 3.89*  CALCIUM 8.2* 8.1* 8.4* 8.3* 8.4*  PHOS  --  4.2  --   --   --     GFR: Estimated Creatinine Clearance: 14.1 mL/min (A) (by C-G formula based on SCr of 3.89 mg/dL (H)).  Liver Function Tests: Recent Labs  Lab 10/18/19 1249 10/19/19 0511 10/20/19 0758  AST 18 19  --   ALT 18 15  --   ALKPHOS 157* 131*  --   BILITOT 1.5* 1.2  --   PROT 6.2* 5.4*  --   ALBUMIN 2.6* 2.3* 2.2*    CBG: No results for input(s): GLUCAP in the last 168 hours.   Recent Results (from the past 240 hour(s))  Culture, blood (Routine x 2)     Status: None (Preliminary result)    Collection Time: 10/18/19 12:45 PM   Specimen: BLOOD  Result Value Ref Range Status   Specimen Description BLOOD RIGHT ANTECUBITAL  Final   Special Requests   Final    BOTTLES DRAWN AEROBIC AND ANAEROBIC Blood Culture adequate volume   Culture   Final    NO GROWTH 4 DAYS Performed at [facility], [address] [place], [place] [address]    Report Status PENDING  Incomplete  Culture, blood (Routine x 2)     Status: None (Preliminary result)   Collection Time: 10/18/19  1:03 PM   Specimen: BLOOD RIGHT HAND  Result Value Ref Range Status   Specimen Description BLOOD RIGHT HAND  Final   Special Requests   Final    BOTTLES DRAWN AEROBIC AND ANAEROBIC Blood Culture results may not be optimal due to an inadequate volume of blood received in culture bottles   Culture   Final    NO GROWTH 4 DAYS Performed at [facility], [place] [address], [place], [place] [address]    Report Status PENDING  Incomplete  SARS Coronavirus 2 by RT PCR (hospital order, performed in [facility] hospital lab) Nasopharyngeal Nasopharyngeal Swab     Status: None   Collection Time: 10/18/19  5:17 PM   Specimen: Nasopharyngeal Swab  Result Value Ref Range Status  SARS Coronavirus 2 NEGATIVE NEGATIVE Final    Comment: (NOTE) SARS-CoV-2 target nucleic acids are NOT DETECTED. The SARS-CoV-2 RNA is generally detectable in upper and lower respiratory specimens during the acute phase of infection. The lowest concentration of SARS-CoV-2 viral copies this assay can detect is 250 copies / mL. A negative result does not preclude SARS-CoV-2 infection and should not be used as the sole basis for treatment or other patient management decisions.  A negative result may occur with improper specimen collection / handling, submission of specimen other than nasopharyngeal swab, presence of viral mutation(s) within the areas targeted by this assay, and inadequate number of viral copies (<250 copies / mL). A negative  result must be combined with clinical observations, patient history, and epidemiological information. Fact Sheet for Patients:   StrictlyIdeas.no Fact Sheet for Healthcare Providers: BankingDealers.co.za This test is not yet approved or cleared  by the [place] FDA and has been authorized for detection and/or diagnosis of SARS-CoV-2 by FDA under an Emergency Use Authorization (EUA).  This EUA will remain in effect (meaning this test can be used) for the duration of the COVID-19 declaration under Section 564(b)(1) of the Act, 21 U.S.C. section 360bbb-3(b)(1), unless the authorization is terminated or revoked sooner. Performed at [facility], [place] [address], [place], [place] [address]          Radiology Studies: No results found.      Scheduled Meds: . allopurinol  50 mg Oral QODAY  . amiodarone  200 mg Oral Daily  . apixaban  5 mg Oral BID  . Chlorhexidine Gluconate Cloth  6 each Topical Daily  . Chlorhexidine Gluconate Cloth  6 each Topical Q0600  . darbepoetin (ARANESP) injection - DIALYSIS  40 mcg Intravenous Q Mon-HD  . fluconazole  100 mg Oral Once per day on Mon Wed Fri Sat  . [START ON 10/23/2019] fluconazole  50 mg Oral Once per day on Sun Tue Thu  . l-methylfolate-B6-B12  1 tablet Oral Daily  . midodrine  15 mg Oral TID  . mineral oil-hydrophilic petrolatum   Topical BID  . multivitamin  1 tablet Oral QHS  . pantoprazole  40 mg Oral BID  . pramipexole  0.25 mg Oral BID  . rosuvastatin  10 mg Oral Daily  . sevelamer carbonate  800 mg Oral TID WC  . tamsulosin  0.4 mg Oral Daily   Continuous Infusions: .  ceFAZolin (ANCEF) IV 2 g (10/20/19 2023)     LOS: 4 days    Time spent: 25 min  Yaakov Guthrie, MD [facility]   To contact the attending provider between 7A-7P or the covering provider during after hours 7P-7A, please log into the web site [email or web address] and access using universal [facility] password for that web site. If you do not have the password, please call the hospital operator.  10/22/2019, 12:52 PM

## 2019-10-22 NOTE — Progress Notes (Signed)
[facility] ROUNDING NOTE   Subjective:   This is a 86 year old gentleman hemodialysis 4 times a week.  He has multiple medical problems including systolic and diastolic congestive heart failure coronary artery disease end-stage renal disease.  He has paroxysmal atrial fibrillation ICD hyperlipidemia hypertension history of CVA.  He was admitted with GI bleed and underwent endoscopic evaluation with EGD 10/21/2019.  Results appear to be pending.  Cellulitis appears to be improving in lower extremities  Blood pressure 98/63 pulse 102 temperature 98.3 O2 sats 99% 2 L nasal cannula  Sodium 135 potassium 3.4 chloride 97 CO2 28 BUN 16 creatinine 2.58 glucose 103 calcium 8.4 hemoglobin 8.7 platelets 86  Amiodarone 200 mg daily, midodrine 50 mg 3 times daily multivitamins 1 daily Protonix 40 mg every 12 hours Mirapex 0.25 mg twice daily Crestor 10 mg daily Renvela 800 mg 3 times daily, Flomax 0.4 mg daily.  Ancef 2 g Monday Wednesday Friday Saturday with dialysis  Last dialysis session 10/19/2019 with 2.7 L removed.     Objective:  Vital signs in last 24 hours:  Temp:  [97.7 F (36.5 C)-99.3 F (37.4 C)] 98.3 F (36.8 C) (05/24 0515) Pulse Rate:  [78-90] 86 (05/24 0515) Resp:  [16-24] 20 (05/24 0515) BP: (90-105)/(42-63) 98/63 (05/24 0515) SpO2:  [98 %-100 %] 99 % (05/24 0515) Weight:  [91 kg] 91 kg (05/24 0257)  Weight change: -0.108 kg Filed Weights   10/20/19 1140 10/21/19 0700 10/22/19 0257  Weight: 91.1 kg 90.6 kg 91 kg    Intake/Output: I/O last 3 completed shifts: In: 758.2 [P.O.:240; I.V.:200; Blood:318.2] Out: 25 [Urine:25]   Intake/Output this shift:  No intake/output data recorded.  Genalert, no distress, elderly WM No jvd or bruits Chest clear bilat RRR no MRG Abd soft ntnd no mass or ascites +bs GU normal male MS no joint effusions or deformity Ext bilat pretib edema improved mild-mod now, mild erythema left lower Neuro is alert, Ox 3 , nf     Basic Metabolic Panel: Recent Labs  Lab 10/18/19 1249 10/18/19 1249 10/19/19 0511 10/20/19 0758 10/21/19 0805  NA 135  --  134* 134* 135  K 3.7  --  4.1 3.6 3.4*  CL 94*  --  95* 96* 97*  CO2 30  --  28 29 28   GLUCOSE 100*  --  113* 102* 103*  BUN 24*  --  33* 25* 16  CREATININE 3.51*  --  3.97* 3.09* 2.58*  CALCIUM 8.5*   < > 8.2* 8.1* 8.4*  PHOS  --   --   --  4.2  --    < > = values in this interval not displayed.    Liver Function Tests: Recent Labs  Lab 10/18/19 1249 10/19/19 0511 10/20/19 0758  AST 18 19  --   ALT 18 15  --   ALKPHOS 157* 131*  --   BILITOT 1.5* 1.2  --   PROT 6.2* 5.4*  --   ALBUMIN 2.6* 2.3* 2.2*   No results for input(s): LIPASE, AMYLASE in the last 168 hours. No results for input(s): AMMONIA in the last 168 hours.  CBC: Recent Labs  Lab 10/18/19 1249 10/19/19 0511 10/20/19 0758 10/21/19 0805  WBC 8.3 7.5 7.0 6.7  NEUTROABS 6.2  --   --   --   HGB 8.5* 7.2* 6.8* 8.7*  HCT 26.3* 22.4* 21.3* 26.7*  MCV 96.3 95.7 97.3 95.4  PLT 106* 90* 92* 86*    Cardiac Enzymes: No results  for input(s): CKTOTAL, CKMB, CKMBINDEX, TROPONINI in the last 168 hours.  BNP: Invalid input(s): POCBNP  CBG: No results for input(s): GLUCAP in the last 168 hours.  Microbiology: Results for orders placed or performed during the hospital encounter of 10/18/19  Culture, blood (Routine x 2)     Status: None (Preliminary result)   Collection Time: 10/18/19 12:45 PM   Specimen: BLOOD  Result Value Ref Range Status   Specimen Description BLOOD RIGHT ANTECUBITAL  Final   Special Requests   Final    BOTTLES DRAWN AEROBIC AND ANAEROBIC Blood Culture adequate volume   Culture   Final    NO GROWTH 3 DAYS Performed at [facility], [place] [address], [place], [place] [address]    Report Status PENDING  Incomplete  Culture, blood (Routine x 2)     Status: None (Preliminary result)   Collection Time: 10/18/19  1:03 PM   Specimen: BLOOD RIGHT HAND   Result Value Ref Range Status   Specimen Description BLOOD RIGHT HAND  Final   Special Requests   Final    BOTTLES DRAWN AEROBIC AND ANAEROBIC Blood Culture results may not be optimal due to an inadequate volume of blood received in culture bottles   Culture   Final    NO GROWTH 3 DAYS Performed at [facility], [place] [address] [place], [place] [address]    Report Status PENDING  Incomplete  SARS Coronavirus 2 by RT PCR (hospital order, performed in [facility] hospital lab) Nasopharyngeal Nasopharyngeal Swab     Status: None   Collection Time: 10/18/19  5:17 PM   Specimen: Nasopharyngeal Swab  Result Value Ref Range Status   SARS Coronavirus 2 NEGATIVE NEGATIVE Final    Comment: (NOTE) SARS-CoV-2 target nucleic acids are NOT DETECTED. The SARS-CoV-2 RNA is generally detectable in upper and lower respiratory specimens during the acute phase of infection. The lowest concentration of SARS-CoV-2 viral copies this assay can detect is 250 copies / mL. A negative result does not preclude SARS-CoV-2 infection and should not be used as the sole basis for treatment or other patient management decisions.  A negative result may occur with improper specimen collection / handling, submission of specimen other than nasopharyngeal swab, presence of viral mutation(s) within the areas targeted by this assay, and inadequate number of viral copies (<250 copies / mL). A negative result must be combined with clinical observations, patient history, and epidemiological information. Fact Sheet for Patients:   StrictlyIdeas.no Fact Sheet for Healthcare Providers: BankingDealers.co.za This test is not yet approved or cleared  by the [place] FDA and has been authorized for detection and/or diagnosis of SARS-CoV-2 by FDA under an Emergency Use Authorization (EUA).  This EUA will remain in effect (meaning this test can be used) for the duration of  the COVID-19 declaration under Section 564(b)(1) of the Act, 21 U.S.C. section 360bbb-3(b)(1), unless the authorization is terminated or revoked sooner. Performed at [facility], [place] [address] [place], [place] [address]     Coagulation Studies: No results for input(s): LABPROT, INR in the last 72 hours.  Urinalysis: No results for input(s): COLORURINE, LABSPEC, PHURINE, GLUCOSEU, HGBUR, BILIRUBINUR, KETONESUR, PROTEINUR, UROBILINOGEN, NITRITE, LEUKOCYTESUR in the last 72 hours.  Invalid input(s): APPERANCEUR    Imaging: No results found.   Medications:   .  ceFAZolin (ANCEF) IV 2 g (10/20/19 2023)   . allopurinol  50 mg Oral QODAY  . amiodarone  200 mg Oral Daily  .  Chlorhexidine Gluconate Cloth  6 each Topical Daily  . Chlorhexidine Gluconate Cloth  6 each Topical Q0600  . darbepoetin (ARANESP) injection - DIALYSIS  40 mcg Intravenous Q Mon-HD  . l-methylfolate-B6-B12  1 tablet Oral Daily  . midodrine  15 mg Oral TID  . multivitamin  1 tablet Oral QHS  . pantoprazole (PROTONIX) IV  40 mg Intravenous Q12H  . pramipexole  0.25 mg Oral BID  . rosuvastatin  10 mg Oral Daily  . sevelamer carbonate  800 mg Oral TID WC  . tamsulosin  0.4 mg Oral Daily   acetaminophen, docusate sodium    OP TX:MIWOEH 4h 400/800 93kg 2/2.25 Hep none TDC RIJ - mirc 50 q 2, last 5/17 - venofer 50/wk  Assessment/ Plan:  1. Gen weakness - multifactorial severe DCM, new anemia, esrd, deconditioning 2. Anemia/ guiac+ - per GI/ pmd, poss GIB. For endo today.  Status post transfusion 10/21/2019 1 unit packed red blood cells.  Darbepoetin 40 mg 10/22/2018 3. ESRD - on HD MWFSat , 4x per wk. Started dialysis on 08/24/19. VVS placed tunneled dialysis catheter, no plan for permanent access because of multiple comorbidities and technical difficulties. HD monday.   4. Vol excess / LE edema - chronic issue, difficult to control w/ severe CM and low BP's. This is the reason for HD 4x/wk.   Down 4-5 kg here, leg edema better. May not be able to totally control edema due to hypotension. But doing better.    Continue on midodrine 15 mg 3 times daily 5. CM/ sp ICD 6. CAD sp CABG '90 7. PAF 8. H/o CVA 9. Anemia ckd -Darbepoetin 40 mg administered 10/22/2019 10. MBD ckd - Ca++ ok, phos 4, cont renagel 11. Cellulitis lower extremity continues on Ancef 2 g.  This is to be administered with dialysis.       LOS: [place] @TODAY @7 :21 AM

## 2019-10-22 NOTE — Progress Notes (Signed)
Pt vitals read yellow BP (!) 98/58 (BP Location: Left Arm)   Pulse 87   Temp 98.8 F (37.1 C) (Oral)   Resp (!) 22   Ht 5\' 6"  (1.676 m)   Wt 91 kg   SpO2 99%   BMI 32.38 kg/m   Pt is just taken to dialysis  Lydia from dialysis  notified of vital signs  will continunue To  monitor and will recheck upon return

## 2019-10-22 NOTE — Progress Notes (Signed)
Subjective: No abdominal pain. No further bleeding.  Objective: Vital signs in last 24 hours: Temp:  [97.9 F (36.6 C)-99.3 F (37.4 C)] 98.3 F (36.8 C) (05/24 0849) Pulse Rate:  [78-90] 78 (05/24 0849) Resp:  [16-24] 17 (05/24 0849) BP: (90-99)/(42-63) 99/54 (05/24 0849) SpO2:  [98 %-100 %] 100 % (05/24 0849) Weight:  [91 kg] 91 kg (05/24 0257) Weight change: -0.108 kg Last BM Date: 10/21/19  PE: GEN: Chronically ill-appearing, slightly tachypnic at rest, NAD ABD:  Soft, protuberant, small reducible umbilical hernia  Lab Results: CBC    Component Value Date/Time   WBC 8.3 10/22/2019 0701   RBC 2.62 (L) 10/22/2019 0701   HGB 8.1 (L) 10/22/2019 0701   HGB 10.2 (L) 10/09/2019 1026   HGB 11.5 (L) 08/13/2019 1437   HCT 25.3 (L) 10/22/2019 0701   HCT 35.5 (L) 08/13/2019 1437   PLT 90 (L) 10/22/2019 0701   PLT 79 (L) 10/09/2019 1026   PLT 95 (LL) 08/13/2019 1437   MCV 96.6 10/22/2019 0701   MCV 94 08/13/2019 1437   MCH 30.9 10/22/2019 0701   MCHC 32.0 10/22/2019 0701   RDW 21.6 (H) 10/22/2019 0701   RDW 16.2 (H) 08/13/2019 1437   LYMPHSABS 1.0 10/18/2019 1249   LYMPHSABS 0.9 05/18/2019 1454   MONOABS 0.6 10/18/2019 1249   EOSABS 0.2 10/18/2019 1249   EOSABS 0.1 05/18/2019 1454   BASOSABS 0.1 10/18/2019 1249   BASOSABS 0.0 05/18/2019 1454   CMP     Component Value Date/Time   NA 133 (L) 10/22/2019 0701   NA 143 08/13/2019 1437   K 3.4 (L) 10/22/2019 0701   CL 97 (L) 10/22/2019 0701   CO2 25 10/22/2019 0701   GLUCOSE 115 (H) 10/22/2019 0701   BUN 25 (H) 10/22/2019 0701   BUN 52 (H) 08/13/2019 1437   CREATININE 3.68 (H) 10/22/2019 0701   CREATININE 2.88 (H) 10/09/2019 1026   CALCIUM 8.3 (L) 10/22/2019 0701   PROT 5.4 (L) 10/19/2019 0511   PROT 6.7 08/13/2019 1437   ALBUMIN 2.2 (L) 10/20/2019 0758   ALBUMIN 4.1 08/13/2019 1437   AST 19 10/19/2019 0511   AST 19 10/09/2019 1026   ALT 15 10/19/2019 0511   ALT 30 10/09/2019 1026   ALKPHOS 131 (H) 10/19/2019  0511   BILITOT 1.2 10/19/2019 0511   BILITOT 1.6 (H) 10/09/2019 1026   GFRNONAA 14 (L) 10/22/2019 0701   GFRNONAA 19 (L) 10/09/2019 1026   GFRAA 16 (L) 10/22/2019 0701   GFRAA 22 (L) 10/09/2019 1026   Assessment:  1.  Melena, resolved. 2.  Anemia, likely acute on chronic. 3.  Chronic anticoagulation (eliquis). 4.  Multiple medical problems.  Plan:  1.  Per EGD note Dr. Penelope Coop, ok to resume Eliquis. 2.  Pantoprazole 40 mg po bid x 6 weeks, then 40 mg po qd thereafter indefinitely. 3.  10-14 day course of fluconazole for esophageal candidiasis. 4.  Regular/Renal diet ok. 5.  No further plans for GI intervention this admission; patient can follow up with Dr. Penelope Coop as outpatient for follow up CBC.  If hgb continues to drop or bleeding recurs in future, may consider colonoscopy, but given patient's age and comorbidities, would like to avoid risks of colonoscopy if at all possible. 6.  [facility] will sign-off; please call with questions; thank you for the consultation.   Landry Dyke 10/22/2019, 11:31 AM   Cell [phone] If no answer or after 5 PM call [phone]

## 2019-10-22 NOTE — Plan of Care (Signed)
  Problem: Education: Goal: Knowledge of General Education information will improve Description: Including pain rating scale, medication(s)/side effects and non-pharmacologic comfort measures Outcome: Progressing   Problem: Health Behavior/Discharge Planning: Goal: Ability to manage health-related needs will improve Outcome: Progressing   Problem: Clinical Measurements: Goal: Will remain free from infection Outcome: Progressing   Problem: Clinical Measurements: Goal: Respiratory complications will improve Outcome: Progressing   Problem: Safety: Goal: Ability to remain free from injury will improve Outcome: Progressing

## 2019-10-22 NOTE — Evaluation (Signed)
Occupational Therapy Evaluation Patient Details Name: Ricardo Payne MRN: [id number] DOB: [birth year] Today's Date: 10/22/2019    History of Present Illness Pt is an 86 yo male s/p BLE swelling, fatigue and hypotension. PMHx: HD 4x weekly, CHF, CAD, ESRD, (M, W, F, S), H/O CVA.   Clinical Impression   Pt PTA: Pt was independent with family nearby to assist as needed. Pt currently limited by SOB upon exertion, O2 remains >90% O2 on RA, decreased activity tolerance and decreased endurance. Pt performing ADL tasks with minguardA overall and mobility in room with no AD and minguardA. Pt would benefit from continued OT skilled services for ADL, mobility and safety. OT following acutely  Pt 97% on RA; HR 105 BPM and 101/56 BP in standing..    Follow Up Recommendations  Home health OT(may progress to no OT follow up)    Equipment Recommendations  None recommended by OT    Recommendations for Other Services       Precautions / Restrictions Precautions Precautions: Fall Restrictions Weight Bearing Restrictions: No      Mobility Bed Mobility Overal bed mobility: Needs Assistance Bed Mobility: Supine to Sit     Supine to sit: Supervision     General bed mobility comments: no use of rails  Transfers Overall transfer level: Needs assistance Equipment used: None Transfers: Sit to/from Stand Sit to Stand: Min guard              Balance Overall balance assessment: Mild deficits observed, not formally tested                                         ADL either performed or assessed with clinical judgement   ADL Overall ADL's : Needs assistance/impaired Eating/Feeding: Modified independent;Sitting   Grooming: Supervision/safety;Standing   Upper Body Bathing: Supervision/ safety;Standing   Lower Body Bathing: Min guard;Sitting/lateral leans;Sit to/from stand   Upper Body Dressing : Supervision/safety;Standing   Lower Body Dressing: Min guard;Cueing  for safety;Sitting/lateral leans;Sit to/from stand Lower Body Dressing Details (indicate cue type and reason): Pt does not wear socks at home- only slip on shoes; pt requires assist for sock donning Toilet Transfer: Min guard;Regular Toilet;Grab bars   Toileting- Clothing Manipulation and Hygiene: Minimal assistance;Sitting/lateral lean;Sit to/from stand;Cueing for safety       Functional mobility during ADLs: Supervision/safety General ADL Comments: Pt limited by SOB upon exertion, O2 remains >90% O2, decreased activity tolerance and decreased endurance.      Vision Baseline Vision/History: Wears glasses Wears Glasses: Reading only Vision Assessment?: No apparent visual deficits     Perception     Praxis      Pertinent Vitals/Pain       Hand Dominance Right   Extremity/Trunk Assessment Upper Extremity Assessment Upper Extremity Assessment: Overall WFL for tasks assessed   Lower Extremity Assessment Lower Extremity Assessment: Generalized weakness   Cervical / Trunk Assessment Cervical / Trunk Assessment: Normal   Communication Communication Communication: No difficulties   Cognition Arousal/Alertness: Awake/alert Behavior During Therapy: WFL for tasks assessed/performed Overall Cognitive Status: Within Functional Limits for tasks assessed                                     General Comments  Pt 97% on RA; HR 105 BPM and 101/56 BP in standing.  Exercises     Shoulder Instructions      Home Living Family/patient expects to be discharged to:: Private residence Living Arrangements: Alone Available Help at Discharge: Family;Available PRN/intermittently Type of Home: Apartment Home Access: Level entry     Home Layout: One level     Bathroom Shower/Tub: Occupational psychologist: Standard     Home Equipment: Bedside commode;Shower seat;Walker - 4 wheels   Additional Comments: uses rollator recently all the time      Prior  Functioning/Environment Level of Independence: Independent with assistive device(s)        Comments: Uses rollator, does own ADLs. Recent rehab stay. Has a son in law that lives close by        OT Problem List: Decreased strength;Decreased activity tolerance      OT Treatment/Interventions: Self-care/ADL training;Therapeutic exercise;Energy conservation;Therapeutic activities;Patient/family education;Balance training    OT Goals(Current goals can be found in the care plan section) Acute Rehab OT Goals Patient Stated Goal: to go home OT Goal Formulation: With patient Time For Goal Achievement: 11/05/19 Potential to Achieve Goals: Good ADL Goals Pt/caregiver will Perform Home Exercise Program: Increased strength;Both right and left upper extremity;With written HEP provided Additional ADL Goal #1: pt will increase to modified independence for OOB ADL tasks with good safety awareness. Additional ADL Goal #2: Pt will recall/initate use of (3) energy conservation strategies in prep for going home alone.  OT Frequency: Min 2X/week   Barriers to D/C:            Co-evaluation              AM-PAC OT "6 Clicks" Daily Activity     Outcome Measure Help from another person eating meals?: None Help from another person taking care of personal grooming?: A Little Help from another person toileting, which includes using toliet, bedpan, or urinal?: A Little Help from another person bathing (including washing, rinsing, drying)?: A Little Help from another person to put on and taking off regular upper body clothing?: None Help from another person to put on and taking off regular lower body clothing?: A Little 6 Click Score: 20   End of Session Equipment Utilized During Treatment: Gait belt Nurse Communication: Mobility status  Activity Tolerance: Patient tolerated treatment well Patient left: in chair;with call bell/phone within reach  OT Visit Diagnosis: Unsteadiness on feet  (R26.81);Muscle weakness (generalized) (M62.81)                Time: 1130-1156 OT Time Calculation (min): 26 min Charges:  OT General Charges $OT Visit: 1 Visit OT Evaluation $OT Eval Moderate Complexity: 1 Mod OT Treatments $Self Care/Home Management : 8-22 mins  Jefferey Pica, OTR/L Acute Rehabilitation Services Pager: [phone] Office: [phone]  [place] 10/22/2019, 4:44 PM

## 2019-10-23 LAB — BASIC METABOLIC PANEL
Anion gap: 9 (ref 5–15)
BUN: 14 mg/dL (ref 8–23)
CO2: 28 mmol/L (ref 22–32)
Calcium: 8.1 mg/dL — ABNORMAL LOW (ref 8.9–10.3)
Chloride: 100 mmol/L (ref 98–111)
Creatinine, Ser: 2.66 mg/dL — ABNORMAL HIGH (ref 0.61–1.24)
GFR calc Af Amer: 24 mL/min — ABNORMAL LOW (ref >60)
GFR calc non Af Amer: 21 mL/min — ABNORMAL LOW (ref >60)
Glucose, Bld: 102 mg/dL — ABNORMAL HIGH (ref 70–99)
Potassium: 3.7 mmol/L (ref 3.5–5.1)
Sodium: 137 mmol/L (ref 135–145)

## 2019-10-23 LAB — CULTURE, BLOOD (ROUTINE X 2)
Culture: NO GROWTH
Culture: NO GROWTH
Special Requests: ADEQUATE

## 2019-10-23 LAB — CBC
HCT: 25.8 % — ABNORMAL LOW (ref 39.0–52.0)
Hemoglobin: 8.2 g/dL — ABNORMAL LOW (ref 13.0–17.0)
MCH: 31.3 pg (ref 26.0–34.0)
MCHC: 31.8 g/dL (ref 30.0–36.0)
MCV: 98.5 fL (ref 80.0–100.0)
Platelets: 86 10*3/uL — ABNORMAL LOW (ref 150–400)
RBC: 2.62 MIL/uL — ABNORMAL LOW (ref 4.22–5.81)
RDW: 22.5 % — ABNORMAL HIGH (ref 11.5–15.5)
WBC: 7.3 10*3/uL (ref 4.0–10.5)
nRBC: 0 % (ref 0.0–0.2)

## 2019-10-23 LAB — CYTOLOGY - NON PAP

## 2019-10-23 MED ORDER — CEPHALEXIN 500 MG PO CAPS: 500.0000 mg | ORAL_CAPSULE | Freq: Every day | ORAL | 0 refills | Status: AC

## 2019-10-23 MED ORDER — FLUCONAZOLE 100 MG PO TABS: 100.0000 mg | ORAL_TABLET | ORAL | 0 refills | Status: AC

## 2019-10-23 MED ORDER — FLUCONAZOLE 50 MG PO TABS: 50.0000 mg | ORAL_TABLET | ORAL | 0 refills | Status: AC

## 2019-10-23 MED ORDER — APIXABAN 2.5 MG PO TABS: 2.5000 mg | ORAL_TABLET | Freq: Two times a day (BID) | ORAL | 0 refills | Status: AC

## 2019-10-23 MED ORDER — CHLORHEXIDINE GLUCONATE CLOTH 2 % EX PADS: 6.0000 | MEDICATED_PAD | Freq: Every day | CUTANEOUS | Status: DC

## 2019-10-23 MED ORDER — PANTOPRAZOLE SODIUM 40 MG PO TBEC: 40.0000 mg | DELAYED_RELEASE_TABLET | Freq: Two times a day (BID) | ORAL | 0 refills | Status: AC

## 2019-10-23 NOTE — Discharge Instructions (Signed)

## 2019-10-23 NOTE — Discharge Summary (Signed)
Physician Discharge Summary  Ricardo Payne XLK:[id number] DOB: November 25, 1930 DOA: 10/18/2019  PCP: [name], FNP  Admit date: 10/18/2019 Discharge date: 10/23/2019  Admitted From: Home Disposition:  Home  Recommendations for Outpatient Follow-up:  1. Follow up with PCP in 1-2 weeks 2. Please obtain BMP/CBC in one week   Home Health: No Equipment/Devices: No  Discharge Condition: Stable CODE STATUS: Partial code (confirmed with patient) Diet recommendation: Renal diet  Brief/Interim Summary: Ricardo Payne a 86 y.o.malewith multiple medical problems significant ofsystolic and diastolic congestive failure, coronary disease, end-stage renal disease on dialysis Monday, Wednesday, Friday, Saturday, paroxysmal atrial fibrillation on chronic anticoagulation, ICD, hyperlipidemia, hypertension, history of CVA. He presented to the emergency room because of worsening bilateral lower extremity swelling, erythema and pain of the left lower extremity which he says has been ongoing for the past couple of days with recent worsening. Also complained of fatigue, hypotension, black-colored stool 10 days ago. Patient says outpatient doxycycline not helping his lower extremity pain and cellulitis.   ED Course:Lab work in the ED showed hemoglobin 8.5. His baseline is usually between 10 and 11. His stool for occult blood was positive per the ER. He was found to be hypotensive with blood pressure 90/47.  Following is the hospital course in the problem list format: Hypotension: Patient has chronic hypotension but now with worsening likely secondary to GI bleed, cellulitis. His hemoglobin dropped as low as 6.8.  He received 250 cc bolus x2 for hypotension and was also given 1 unit of blood. -Looks like he has chronic hypotension on midodrine.  Blood pressure stable at this time.  Cellulitis of lower extremity: Patient has chronic bilateral lower extremity edema, also recently has been  having erythema and tenderness worse in his left lower extremity. Has some scabs on his lower extremities but open lesions noted at this time.  Failed outpatient doxycycline. Improving with IV Ancef.  -He also has extremely dry feet ordered Aquaphor.  -His cellulitis has improved.  His leg slightly back to his baseline.  The chronic lower extremity edema was not helping his cellulitis.  He was advised to elevate his feet while sitting.  He also had very dry skin, was advised to keep his skin moisturized. -He will be discharged on p.o. Keflex to complete the treatment course.  GI bleed: Patient was found to have dropped his hemoglobin from 10.2-8.5 in the past week or so.  He had dark stools 10 days ago.He is on chronic anticoagulation with Eliquis due to paroxysmal atrial fibrillation.  Initially Eliquis was held. His hemoglobin dropped as low as 6.8.  Improved with 1 unit PRBC.  Consulted Gastroenterology.  He is status post EGD which showed white plaques suspicious for candidiasis, gastric petechiae.  Per GI okay to resume Eliquis.  -Hemoglobin remained stable. - Per GI "Pantoprazole 40 mg po bid x 6 weeks, then 40 mg po qd thereafter indefinitely" -If hemoglobin drops in future or if he has recurrent bleeding then may consider colonoscopy but given patient's age and comorbidities avoid risk of colonoscopy if at all possible per GI.  Esophageal candidiasis: Started him on fluconazole and plan to treat for duration of 10-14 days.  He denies having any dysphagia or odynophagia at this time.  ESRD on hemodialysis: Dialysis as per nephrology. Patient gets dialyzed Monday, Wednesday, Friday, Saturday. Continue home medications.   Chronic combined systolic/diastolic and heart failure: Continue home medications. Continue to monitor.  Thrombocytopenia: Likely has chronic thrombocytopenia? Etiology unclear. Platelets low but  stable.  He was advised to follow-up with his outpatient  physician.  Paroxysmal atrial fibrillation: Continue amiodarone.  Restarted on Eliquis.  Advised to follow-up with cardiology outpatient. Patient says he feels a lot better and is wanting to be discharged home.  Discharge Diagnoses:  Principal Problem:   Hypotension Active Problems:   Cellulitis of leg   GI bleed   ESRD on dialysis (HCC)   Chronic combined systolic and diastolic CHF (congestive heart failure) (HCC)   Thrombocytopenia (HCC)   Blood thinned due to long-term anticoagulant use    Discharge Instructions Discharge plan of care discussed with the patient.  Answered his questions appropriately to the best of my knowledge.  He is being discharged home in a stable condition.  He was seen by PT/OT.  They did not think he had any discharge needs.  Allergies as of 10/23/2019   No Known Allergies     Medication List    STOP taking these medications   doxycycline 100 MG tablet Commonly known as: VIBRA-TABS   predniSONE 20 MG tablet Commonly known as: DELTASONE     TAKE these medications   acetaminophen 500 MG tablet Commonly known as: TYLENOL Take 1,000 mg by mouth every 6 (six) hours as needed for mild pain.   allopurinol 100 MG tablet Commonly known as: ZYLOPRIM Take 0.5 tablets (50 mg total) by mouth every other day.   amiodarone 200 MG tablet Commonly known as: PACERONE Take 1 tablet (200 mg total) by mouth daily.   apixaban 2.5 MG Tabs tablet Commonly known as: ELIQUIS Take 1 tablet (2.5 mg total) by mouth 2 (two) times daily. What changed:   medication strength  how much to take   cephALEXin 500 MG capsule Commonly known as: KEFLEX Take 1 capsule (500 mg total) by mouth daily for 4 days. Start taking on: Oct 24, 2019   docusate sodium 50 MG capsule Commonly known as: COLACE Take 50 mg by mouth daily.   fluconazole 100 MG tablet Commonly known as: DIFLUCAN Take 1 tablet (100 mg total) by mouth as directed. Take 100mg  AFTER HD on HD days  (MWFS), and 50mg  on non-HD days (TThSun)   fluconazole 50 MG tablet Commonly known as: DIFLUCAN Take 1 tablet (50 mg total) by mouth as directed. Take 100mg  AFTER HD on HD days (MWFS), and 50mg  on non-HD days (TThSun)   Foltanx 3-35-2 MG Tabs Take 1 tablet by mouth daily.   midodrine 10 MG tablet Commonly known as: PROAMATINE Take 15 mg by mouth 3 (three) times daily.   multivitamin Tabs tablet Take 1 tablet by mouth at bedtime.   pantoprazole 40 MG tablet Commonly known as: PROTONIX Take 1 tablet (40 mg total) by mouth 2 (two) times daily.   pramipexole 0.25 MG tablet Commonly known as: MIRAPEX Take 0.25 mg by mouth 2 (two) times daily.   rosuvastatin 10 MG tablet Commonly known as: CRESTOR Take 1 tablet (10 mg total) by mouth daily.   sevelamer 800 MG tablet Commonly known as: RENAGEL Take 800 mg by mouth 3 (three) times daily with meals.   tamsulosin 0.4 MG Caps capsule Commonly known as: FLOMAX Take 1 capsule (0.4 mg total) by mouth daily.       No Known Allergies  Consultations: Gastroenterology Nephrology  Procedures/Studies: EGD DG Chest Portable 1 View  Result Date: 10/18/2019 CLINICAL DATA:  Fluid overload EXAM: PORTABLE CHEST 1 VIEW COMPARISON:  Chest radiograph dated 09/01/2019 FINDINGS: A right internal jugular central venous catheter tip  overlies the superior cavoatrial junction. A left subclavian approach cardiac device is redemonstrated. Median sternotomy wires are seen. The heart remains enlarged. Vascular calcifications are seen in the aortic arch. Both lungs are clear. The visualized skeletal structures are unremarkable. IMPRESSION: 1. No acute cardiopulmonary process. 2. Cardiomegaly. Electronically Signed   By: Zerita Boers M.D.   On: 10/18/2019 17:59   CUP PACEART REMOTE DEVICE CHECK  Result Date: 10/17/2019 Scheduled remote reviewed. Normal device function.  Next remote 91 days. Felisa Bonier, RN, MSN  VAS [place] LOWER EXTREMITY VENOUS (DVT) ([facility]  and [facility] 7a-7p)  Result Date: 10/18/2019  Lower Venous DVTStudy Indications: Edema.  Limitations: Body habitus and poor ultrasound/tissue interface. Comparison Study: 07/06/19 Performing Technologist: Abram Sander RVS  Examination Guidelines: A complete evaluation includes B-mode imaging, spectral Doppler, color Doppler, and power Doppler as needed of all accessible portions of each vessel. Bilateral testing is considered an integral part of a complete examination. Limited examinations for reoccurring indications may be performed as noted. The reflux portion of the exam is performed with the patient in reverse Trendelenburg.  +---------+---------------+---------+-----------+----------+------------------+ RIGHT    CompressibilityPhasicitySpontaneityPropertiesThrombus Aging     +---------+---------------+---------+-----------+----------+------------------+ CFV      Full           Yes      Yes                                     +---------+---------------+---------+-----------+----------+------------------+ SFJ      Full                                                            +---------+---------------+---------+-----------+----------+------------------+ FV Prox  Full                                                            +---------+---------------+---------+-----------+----------+------------------+ FV Mid   Full                                                            +---------+---------------+---------+-----------+----------+------------------+ FV DistalFull                                                            +---------+---------------+---------+-----------+----------+------------------+ PFV      Full                                                            +---------+---------------+---------+-----------+----------+------------------+ POP      Full  Yes      Yes                                      +---------+---------------+---------+-----------+----------+------------------+ PTV      Full                                         limited                                                                  visualization      +---------+---------------+---------+-----------+----------+------------------+ PERO                                                  Not visualized     +---------+---------------+---------+-----------+----------+------------------+   +---------+---------------+---------+-----------+----------+--------------+ LEFT     CompressibilityPhasicitySpontaneityPropertiesThrombus Aging +---------+---------------+---------+-----------+----------+--------------+ CFV      Full           Yes      Yes                                 +---------+---------------+---------+-----------+----------+--------------+ SFJ      Full                                                        +---------+---------------+---------+-----------+----------+--------------+ FV Prox  Full                                                        +---------+---------------+---------+-----------+----------+--------------+ FV Mid   Full                                                        +---------+---------------+---------+-----------+----------+--------------+ FV Distal               Yes      Yes                                 +---------+---------------+---------+-----------+----------+--------------+ PFV      Full                                                        +---------+---------------+---------+-----------+----------+--------------+ POP  Yes      Yes                                 +---------+---------------+---------+-----------+----------+--------------+ PTV      Full                                                        +---------+---------------+---------+-----------+----------+--------------+ PERO                                                   Not visualized +---------+---------------+---------+-----------+----------+--------------+     Summary: BILATERAL: - No evidence of deep vein thrombosis seen in the lower extremities, bilaterally. -   *See table(s) above for measurements and observations. Electronically signed by Monica Martinez MD on 10/18/2019 at 5:36:01 PM.    Final       Subjective: He denies having any complaints and says he is feeling better. Discharge Exam: Vitals:   10/23/19 1052 10/23/19 1206  BP: (!) 104/58 109/63  Pulse: 88 87  Resp:  18  Temp:  (!) 97.3 F (36.3 C)  SpO2: 100% 96%   Vitals:   10/23/19 0544 10/23/19 0636 10/23/19 1052 10/23/19 1206  BP: (!) 86/57 98/62 (!) 104/58 109/63  Pulse: 87 78 88 87  Resp: (!) 22 20  18   Temp: 98.1 F (36.7 C) 98 F (36.7 C)  (!) 97.3 F (36.3 C)  TempSrc: Oral Oral  Oral  SpO2: 100% 100% 100% 96%  Weight:      Height:        General: Pt is alert, awake, not in acute distress Cardiovascular: RRR, S1/S2 +, no rubs, no gallops Respiratory: CTA bilaterally, no wheezing, no rhonchi Abdominal: Small reducible hernia, soft, NT, ND, bowel sounds + Extremities: Chronic bilateral lower extremity lymphedema, cellulitis improved from previous    The results of significant diagnostics from this hospitalization (including imaging, microbiology, ancillary and laboratory) are listed below for reference.     Microbiology: Recent Results (from the past 240 hour(s))  Culture, blood (Routine x 2)     Status: None   Collection Time: 10/18/19 12:45 PM   Specimen: BLOOD  Result Value Ref Range Status   Specimen Description BLOOD RIGHT ANTECUBITAL  Final   Special Requests   Final    BOTTLES DRAWN AEROBIC AND ANAEROBIC Blood Culture adequate volume   Culture   Final    NO GROWTH 5 DAYS Performed at [facility], [address], [place], [place] [address]    Report Status 10/23/2019 FINAL  Final  Culture, blood (Routine x  2)     Status: None   Collection Time: 10/18/19  1:03 PM   Specimen: BLOOD RIGHT HAND  Result Value Ref Range Status   Specimen Description BLOOD RIGHT HAND  Final   Special Requests   Final    BOTTLES DRAWN AEROBIC AND ANAEROBIC Blood Culture results may not be optimal due to an inadequate volume of blood received in culture bottles   Culture   Final    NO GROWTH 5 DAYS Performed at [facility], [place] [address], [place], [place]  [address]    Report Status 10/23/2019 FINAL  Final  SARS Coronavirus 2 by RT PCR (hospital order, performed in [facility] hospital lab) Nasopharyngeal Nasopharyngeal Swab     Status: None   Collection Time: 10/18/19  5:17 PM   Specimen: Nasopharyngeal Swab  Result Value Ref Range Status   SARS Coronavirus 2 NEGATIVE NEGATIVE Final    Comment: (NOTE) SARS-CoV-2 target nucleic acids are NOT DETECTED. The SARS-CoV-2 RNA is generally detectable in upper and lower respiratory specimens during the acute phase of infection. The lowest concentration of SARS-CoV-2 viral copies this assay can detect is 250 copies / mL. A negative result does not preclude SARS-CoV-2 infection and should not be used as the sole basis for treatment or other patient management decisions.  A negative result may occur with improper specimen collection / handling, submission of specimen other than nasopharyngeal swab, presence of viral mutation(s) within the areas targeted by this assay, and inadequate number of viral copies (<250 copies / mL). A negative result must be combined with clinical observations, patient history, and epidemiological information. Fact Sheet for Patients:   StrictlyIdeas.no Fact Sheet for Healthcare Providers: BankingDealers.co.za This test is not yet approved or cleared  by the [place] FDA and has been authorized for detection and/or diagnosis of SARS-CoV-2 by FDA under an Emergency Use Authorization  (EUA).  This EUA will remain in effect (meaning this test can be used) for the duration of the COVID-19 declaration under Section 564(b)(1) of the Act, 21 U.S.C. section 360bbb-3(b)(1), unless the authorization is terminated or revoked sooner. Performed at [facility], [place] [address] [place], [place] [address]      Labs: BNP (last 3 results) Recent Labs    08/13/19 1437 08/16/19 1659 08/17/19 0714  BNP 1,224.9* 1,081.1* 2,800.3*   Basic Metabolic Panel: Recent Labs  Lab 10/20/19 0758 10/21/19 0805 10/22/19 0701 10/22/19 1125 10/23/19 0504  NA 134* 135 133* 133* 137  K 3.6 3.4* 3.4* 4.1 3.7  CL 96* 97* 97* 96* 100  CO2 29 28 25 26 28   GLUCOSE 102* 103* 115* 127* 102*  BUN 25* 16 25* 27* 14  CREATININE 3.09* 2.58* 3.68* 3.89* 2.66*  CALCIUM 8.1* 8.4* 8.3* 8.4* 8.1*  PHOS 4.2  --   --   --   --    Liver Function Tests: Recent Labs  Lab 10/18/19 1249 10/19/19 0511 10/20/19 0758  AST 18 19  --   ALT 18 15  --   ALKPHOS 157* 131*  --   BILITOT 1.5* 1.2  --   PROT 6.2* 5.4*  --   ALBUMIN 2.6* 2.3* 2.2*   No results for input(s): LIPASE, AMYLASE in the last 168 hours. No results for input(s): AMMONIA in the last 168 hours. CBC: Recent Labs  Lab 10/18/19 1249 10/19/19 0511 10/20/19 0758 10/21/19 0805 10/22/19 0701 10/22/19 1125 10/23/19 0504  WBC 8.3   < > 7.0 6.7 8.3 8.0 7.3  NEUTROABS 6.2  --   --   --   --   --   --   HGB 8.5*   < > 6.8* 8.7* 8.1* 7.9* 8.2*  HCT 26.3*   < > 21.3* 26.7* 25.3* 24.9* 25.8*  MCV 96.3   < > 97.3 95.4 96.6 97.3 98.5  PLT 106*   < > 92* 86* 90* 91* 86*   < > = values in this interval not displayed.   Cardiac Enzymes: No results for input(s): CKTOTAL, CKMB, CKMBINDEX, TROPONINI in  the last 168 hours. BNP: Invalid input(s): POCBNP CBG: No results for input(s): GLUCAP in the last 168 hours. D-Dimer No results for input(s): DDIMER in the last 72 hours. Hgb A1c No results for input(s): HGBA1C in the last 72  hours. Lipid Profile No results for input(s): CHOL, HDL, LDLCALC, TRIG, CHOLHDL, LDLDIRECT in the last 72 hours. Thyroid function studies No results for input(s): TSH, T4TOTAL, T3FREE, THYROIDAB in the last 72 hours.  Invalid input(s): FREET3 Anemia work up No results for input(s): VITAMINB12, FOLATE, FERRITIN, TIBC, IRON, RETICCTPCT in the last 72 hours. Urinalysis    Component Value Date/Time   COLORURINE YELLOW 08/20/2019 1608   APPEARANCEUR CLEAR 08/20/2019 1608   LABSPEC 1.010 08/20/2019 1608   PHURINE 5.0 08/20/2019 1608   GLUCOSEU NEGATIVE 08/20/2019 1608   HGBUR NEGATIVE 08/20/2019 1608   BILIRUBINUR NEGATIVE 08/20/2019 1608   KETONESUR NEGATIVE 08/20/2019 1608   PROTEINUR NEGATIVE 08/20/2019 1608   NITRITE NEGATIVE 08/20/2019 1608   LEUKOCYTESUR NEGATIVE 08/20/2019 1608   Sepsis Labs Invalid input(s): PROCALCITONIN,  WBC,  LACTICIDVEN Microbiology Recent Results (from the past 240 hour(s))  Culture, blood (Routine x 2)     Status: None   Collection Time: 10/18/19 12:45 PM   Specimen: BLOOD  Result Value Ref Range Status   Specimen Description BLOOD RIGHT ANTECUBITAL  Final   Special Requests   Final    BOTTLES DRAWN AEROBIC AND ANAEROBIC Blood Culture adequate volume   Culture   Final    NO GROWTH 5 DAYS Performed at [facility], [place] [address] [place], [place] [address]    Report Status 10/23/2019 FINAL  Final  Culture, blood (Routine x 2)     Status: None   Collection Time: 10/18/19  1:03 PM   Specimen: BLOOD RIGHT HAND  Result Value Ref Range Status   Specimen Description BLOOD RIGHT HAND  Final   Special Requests   Final    BOTTLES DRAWN AEROBIC AND ANAEROBIC Blood Culture results may not be optimal due to an inadequate volume of blood received in culture bottles   Culture   Final    NO GROWTH 5 DAYS Performed at [facility], [place] [address], [place], [place] [address]    Report Status 10/23/2019 FINAL  Final  SARS Coronavirus 2 by RT PCR  (hospital order, performed in [facility] hospital lab) Nasopharyngeal Nasopharyngeal Swab     Status: None   Collection Time: 10/18/19  5:17 PM   Specimen: Nasopharyngeal Swab  Result Value Ref Range Status   SARS Coronavirus 2 NEGATIVE NEGATIVE Final    Comment: (NOTE) SARS-CoV-2 target nucleic acids are NOT DETECTED. The SARS-CoV-2 RNA is generally detectable in upper and lower respiratory specimens during the acute phase of infection. The lowest concentration of SARS-CoV-2 viral copies this assay can detect is 250 copies / mL. A negative result does not preclude SARS-CoV-2 infection and should not be used as the sole basis for treatment or other patient management decisions.  A negative result may occur with improper specimen collection / handling, submission of specimen other than nasopharyngeal swab, presence of viral mutation(s) within the areas targeted by this assay, and inadequate number of viral copies (<250 copies / mL). A negative result must be combined with clinical observations, patient history, and epidemiological information. Fact Sheet for Patients:   StrictlyIdeas.no Fact Sheet for Healthcare Providers: BankingDealers.co.za This test is not yet approved or cleared  by the [place] FDA and has been authorized for detection  and/or diagnosis of SARS-CoV-2 by FDA under an Emergency Use Authorization (EUA).  This EUA will remain in effect (meaning this test can be used) for the duration of the COVID-19 declaration under Section 564(b)(1) of the Act, 21 U.S.C. section 360bbb-3(b)(1), unless the authorization is terminated or revoked sooner. Performed at [facility], [place] [address] [place], [place] [address]      Time coordinating discharge: Over 30 minutes  SIGNED:   Yaakov Guthrie, MD  [facility] 10/23/2019, 4:45 PM Pager on amion  If 7PM-7AM, please contact  night-coverage [email or web address] Password [email or web address]

## 2019-10-23 NOTE — Evaluation (Signed)
Physical Therapy One time Evaluation Patient Details Name: Ricardo Payne MRN: [id number] DOB: May 12, 1931 Today's Date: 10/23/2019   History of Present Illness  Pt is an 86 yo male s/p BLE swelling, fatigue and hypotension. PMHx: HD 4x weekly, CHF, CAD, ESRD, (M, W, F, S), H/O CVA.  Clinical Impression  Pt admitted with above diagnosis.  Pt currently without  functional limitations and is ambulating at baseline or better actually as he did not need device on unit today to ambulate. Pt without LOB with challenges. Sats >90% on RA.  Some slight DOE 2/4 noted but pt took standing rest breaks prn. Do not feel pt needs skilled PT at this time.  Will sign off.     Follow Up Recommendations No PT follow up    Equipment Recommendations  None recommended by PT    Recommendations for Other Services       Precautions / Restrictions Precautions Precautions: Fall Restrictions Weight Bearing Restrictions: No      Mobility  Bed Mobility Overal bed mobility: Independent Bed Mobility: Supine to Sit     Supine to sit: Independent     General bed mobility comments: no use of rails  Transfers   Equipment used: None Transfers: Sit to/from Stand Sit to Stand: Supervision         General transfer comment: No assist needed.   Ambulation/Gait Ambulation/Gait assistance: Independent Gait Distance (Feet): 150 Feet Assistive device: None Gait Pattern/deviations: Step-through pattern   Gait velocity interpretation: 1.31 - 2.62 ft/sec, indicative of limited community ambulator General Gait Details: Pt did well without LOB.  Sats would not register on PT pulse ox but have been above 90% on other monitors when pt walked.  NO LOB and steady gait.   Stairs Stairs: (does not have steps per pt)          Wheelchair Mobility    Modified Rankin (Stroke Patients Only)       Balance Overall balance assessment: Mild deficits observed, not formally tested         Standing  balance support: No upper extremity supported;During functional activity Standing balance-Leahy Scale: Good Standing balance comment: can withstand challenges to balance and not need external support                              Pertinent Vitals/Pain Pain Assessment: No/denies pain    Home Living Family/patient expects to be discharged to:: Private residence Living Arrangements: Alone Available Help at Discharge: Family;Available PRN/intermittently Type of Home: Apartment Home Access: Level entry     Home Layout: One level Home Equipment: Bedside commode;Shower seat;Walker - 4 wheels Additional Comments: uses rollator recently all the time    Prior Function Level of Independence: Independent with assistive device(s)         Comments: Uses rollator, does own ADLs. Recent rehab stay. Has a son in law that lives close by     Hand Dominance   Dominant Hand: Right    Extremity/Trunk Assessment   Upper Extremity Assessment Upper Extremity Assessment: Defer to OT evaluation    Lower Extremity Assessment Lower Extremity Assessment: Generalized weakness    Cervical / Trunk Assessment Cervical / Trunk Assessment: Normal  Communication   Communication: No difficulties  Cognition Arousal/Alertness: Awake/alert Behavior During Therapy: WFL for tasks assessed/performed Overall Cognitive Status: Within Functional Limits for tasks assessed  General Comments      Exercises General Exercises - Lower Extremity Hip Flexion/Marching: AROM;Both;10 reps;Standing   Assessment/Plan    PT Assessment Patent does not need any further PT services  PT Problem List         PT Treatment Interventions      PT Goals (Current goals can be found in the Care Plan section)  Acute Rehab PT Goals Patient Stated Goal: to go home PT Goal Formulation: All assessment and education complete, DC therapy    Frequency      Barriers to discharge        Co-evaluation               AM-PAC PT "6 Clicks" Mobility  Outcome Measure Help needed turning from your back to your side while in a flat bed without using bedrails?: None Help needed moving from lying on your back to sitting on the side of a flat bed without using bedrails?: None Help needed moving to and from a bed to a chair (including a wheelchair)?: None Help needed standing up from a chair using your arms (e.g., wheelchair or bedside chair)?: None Help needed to walk in hospital room?: None Help needed climbing 3-5 steps with a railing? : None 6 Click Score: 24    End of Session Equipment Utilized During Treatment: Gait belt Activity Tolerance: Patient tolerated treatment well Patient left: in chair;with call bell/phone within reach Nurse Communication: Mobility status PT Visit Diagnosis: Muscle weakness (generalized) (M62.81)    Time: 5597-4163 PT Time Calculation (min) (ACUTE ONLY): 18 min   Charges:   PT Evaluation $PT Eval Low Complexity: 1 Low          Dorian Renfro W,PT Acute Rehabilitation Services Pager:  [phone]  Office:  [place] 10/23/2019, 11:29 AM

## 2019-10-23 NOTE — Care Management Important Message (Signed)
Important Message  Patient Details  Name: Ricardo Payne MRN: [id number] Date of Birth: 1931/04/23   Medicare Important Message Given:  Yes     Shelda Altes 10/23/2019, 11:31 AM

## 2019-10-23 NOTE — Progress Notes (Signed)
Ricardo Payne   Subjective:   This is a 86 year old gentleman hemodialysis 4 times a week.  He has multiple medical problems including systolic and diastolic congestive heart failure coronary artery disease end-stage renal disease.  He has paroxysmal atrial fibrillation ICD hyperlipidemia hypertension history of CVA.  He was admitted with GI bleed and underwent endoscopic evaluation with EGD 10/21/2019.  Results appear to be pending.  Cellulitis appears to be improving in lower extremities  Blood pressure 98/63 pulse 102 temperature 98.3 O2 sats 99% 2 L nasal cannula  Sodium 137 potassium 3.7 chloride 100 CO2 28 BUN 14 creatinine 2.66 glucose 102 calcium 8.1 hemoglobin 8.2 platelets 86  Allopurinol 50 mg every other day, Eliquis 2.5 mg twice daily, amiodarone 200 mg daily, midodrine 15 mg 3 times daily multivitamins 1 daily Protonix 40 mg every 12 hours Mirapex 0.25 mg twice daily Crestor 10 mg daily Renvela 800 mg 3 times daily, Flomax 0.4 mg daily.  Fluconazole 50 mg 3 times a week  Ancef 2 g Monday Wednesday Friday Saturday with dialysis  Last dialysis session 10/22/2019 with 650 cc removed       Objective:  Vital signs in last 24 hours:  Temp:  [98 F (36.7 C)-98.8 F (37.1 C)] 98 F (36.7 C) (05/25 0636) Pulse Rate:  [78-100] 78 (05/25 0636) Resp:  [17-24] 20 (05/25 0636) BP: (83-122)/(46-66) 98/62 (05/25 0636) SpO2:  [99 %-100 %] 100 % (05/25 0636) Weight:  [90.4 kg-91.6 kg] 91.6 kg (05/25 0104)  Weight change: -0.592 kg Filed Weights   10/22/19 2020 10/22/19 2330 10/23/19 0104  Weight: 90.4 kg 90.4 kg 91.6 kg    Intake/Output: I/O last 3 completed shifts: In: 680 [P.O.:480; IV Piggyback:200] Out: 650 [Urine:150; Other:500]   Intake/Output this shift:  No intake/output data recorded.  Genalert, no distress, elderly WM No jvd or bruits Chest clear bilat RRR no MRG Abd soft ntnd no mass or ascites +bs GU normal male MS no joint  effusions or deformity Ext bilat pretib edema improved mild-mod now, mild erythema left lower Neuro is alert, Ox 3 , nf    Basic Metabolic Panel: Recent Labs  Lab 10/20/19 0758 10/20/19 0758 10/21/19 0805 10/21/19 0805 10/22/19 0701 10/22/19 1125 10/23/19 0504  NA 134*  --  135  --  133* 133* 137  K 3.6  --  3.4*  --  3.4* 4.1 3.7  CL 96*  --  97*  --  97* 96* 100  CO2 29  --  28  --  25 26 28   GLUCOSE 102*  --  103*  --  115* 127* 102*  BUN 25*  --  16  --  25* 27* 14  CREATININE 3.09*  --  2.58*  --  3.68* 3.89* 2.66*  CALCIUM 8.1*   < > 8.4*   < > 8.3* 8.4* 8.1*  PHOS 4.2  --   --   --   --   --   --    < > = values in this interval not displayed.    Liver Function Tests: Recent Labs  Lab 10/18/19 1249 10/19/19 0511 10/20/19 0758  AST 18 19  --   ALT 18 15  --   ALKPHOS 157* 131*  --   BILITOT 1.5* 1.2  --   PROT 6.2* 5.4*  --   ALBUMIN 2.6* 2.3* 2.2*   No results for input(s): LIPASE, AMYLASE in the last 168 hours. No results for input(s): AMMONIA in the  last 168 hours.  CBC: Recent Labs  Lab 10/18/19 1249 10/19/19 0511 10/20/19 0758 10/21/19 0805 10/22/19 0701 10/22/19 1125 10/23/19 0504  WBC 8.3   < > 7.0 6.7 8.3 8.0 7.3  NEUTROABS 6.2  --   --   --   --   --   --   HGB 8.5*   < > 6.8* 8.7* 8.1* 7.9* 8.2*  HCT 26.3*   < > 21.3* 26.7* 25.3* 24.9* 25.8*  MCV 96.3   < > 97.3 95.4 96.6 97.3 98.5  PLT 106*   < > 92* 86* 90* 91* 86*   < > = values in this interval not displayed.    Cardiac Enzymes: No results for input(s): CKTOTAL, CKMB, CKMBINDEX, TROPONINI in the last 168 hours.  BNP: Invalid input(s): POCBNP  CBG: No results for input(s): GLUCAP in the last 168 hours.  Microbiology: Results for orders placed or performed during the hospital encounter of 10/18/19  Culture, blood (Routine x 2)     Status: None (Preliminary result)   Collection Time: 10/18/19 12:45 PM   Specimen: BLOOD  Result Value Ref Range Status   Specimen Description  BLOOD RIGHT ANTECUBITAL  Final   Special Requests   Final    BOTTLES DRAWN AEROBIC AND ANAEROBIC Blood Culture adequate volume   Culture   Final    NO GROWTH 4 DAYS Performed at [facility], [place] [address], [place], [place] [address]    Report Status PENDING  Incomplete  Culture, blood (Routine x 2)     Status: None (Preliminary result)   Collection Time: 10/18/19  1:03 PM   Specimen: BLOOD RIGHT HAND  Result Value Ref Range Status   Specimen Description BLOOD RIGHT HAND  Final   Special Requests   Final    BOTTLES DRAWN AEROBIC AND ANAEROBIC Blood Culture results may not be optimal due to an inadequate volume of blood received in culture bottles   Culture   Final    NO GROWTH 4 DAYS Performed at [facility], [place] [address], [place],  [address]    Report Status PENDING  Incomplete  SARS Coronavirus 2 by RT PCR (hospital order, performed in [facility] hospital lab) Nasopharyngeal Nasopharyngeal Swab     Status: None   Collection Time: 10/18/19  5:17 PM   Specimen: Nasopharyngeal Swab  Result Value Ref Range Status   SARS Coronavirus 2 NEGATIVE NEGATIVE Final    Comment: (Payne) SARS-CoV-2 target nucleic acids are NOT DETECTED. The SARS-CoV-2 RNA is generally detectable in upper and lower respiratory specimens during the acute phase of infection. The lowest concentration of SARS-CoV-2 viral copies this assay can detect is 250 copies / mL. A negative result does not preclude SARS-CoV-2 infection and should not be used as the sole basis for treatment or other patient management decisions.  A negative result may occur with improper specimen collection / handling, submission of specimen other than nasopharyngeal swab, presence of viral mutation(s) within the areas targeted by this assay, and inadequate number of viral copies (<250 copies / mL). A negative result must be combined with clinical observations, patient history, and epidemiological information. Fact  Sheet for Patients:   StrictlyIdeas.no Fact Sheet for Healthcare Providers: BankingDealers.co.za This test is not yet approved or cleared  by the [place] FDA and has been authorized for detection and/or diagnosis of SARS-CoV-2 by FDA under an Emergency Use Authorization (EUA).  This EUA will remain in effect (meaning this test can  be used) for the duration of the COVID-19 declaration under Section 564(b)(1) of the Act, 21 U.S.C. section 360bbb-3(b)(1), unless the authorization is terminated or revoked sooner. Performed at [facility], [place] [address] [place], [place] [address]     Coagulation Studies: No results for input(s): LABPROT, INR in the last 72 hours.  Urinalysis: No results for input(s): COLORURINE, LABSPEC, PHURINE, GLUCOSEU, HGBUR, BILIRUBINUR, KETONESUR, PROTEINUR, UROBILINOGEN, NITRITE, LEUKOCYTESUR in the last 72 hours.  Invalid input(s): APPERANCEUR    Imaging: No results found.   Medications:   .  ceFAZolin (ANCEF) IV 2 g (10/22/19 1833)   . acetaminophen      . allopurinol  50 mg Oral QODAY  . amiodarone  200 mg Oral Daily  . apixaban  2.5 mg Oral BID  . Chlorhexidine Gluconate Cloth  6 each Topical Daily  . Chlorhexidine Gluconate Cloth  6 each Topical Q0600  . darbepoetin (ARANESP) injection - DIALYSIS  40 mcg Intravenous Q Mon-HD  . fluconazole  100 mg Oral Once per day on Mon Wed Fri Sat  . fluconazole  50 mg Oral Once per day on Sun Tue Thu  . l-methylfolate-B6-B12  1 tablet Oral Daily  . midodrine  15 mg Oral TID  . mineral oil-hydrophilic petrolatum   Topical BID  . multivitamin  1 tablet Oral QHS  . pantoprazole  40 mg Oral BID  . pramipexole  0.25 mg Oral BID  . rosuvastatin  10 mg Oral Daily  . sevelamer carbonate  800 mg Oral TID WC  . tamsulosin  0.4 mg Oral Daily   acetaminophen, docusate sodium    OP IW:PYKDXI 4h 400/800 93kg 2/2.25 Hep none TDC RIJ - mirc 50 q  2, last 5/17 - venofer 50/wk  Assessment/ Plan:  1. Gen weakness - multifactorial severe DCM, new anemia, esrd, deconditioning 2. Anemia/ guiac+ - per GI/ pmd, poss GIB. For endo today.  Status post transfusion 10/21/2019 1 unit packed red blood cells.  Darbepoetin 40 mg 10/22/2018 3. ESRD - on HD MWFSat , 4x per wk. Started dialysis on 08/24/19. VVS placed tunneled dialysis catheter, no plan for permanent access because of multiple comorbidities and technical difficulties.   Next dialysis treatment be 10/24/2019.   4. Vol excess / LE edema - chronic issue, difficult to control w/ severe CM and low BP's. This is the reason for HD 4x/wk.  Down 4-5 kg here, leg edema better. May not be able to totally control edema due to hypotension. But doing better.    Continue on midodrine 15 mg 3 times daily 5. CM/ sp ICD 6. CAD sp CABG '90 7. PAF 8. H/o CVA 9. Anemia ckd -Darbepoetin 40 mg administered 10/22/2019 10. MBD ckd - Ca++ ok, phos 4, cont renagel 11. Cellulitis lower extremity continues on Ancef 2 g.  This is to be administered with dialysis.       LOS: [place] @TODAY @7 :33 AM

## 2019-10-25 ENCOUNTER — Telehealth: Payer: Self-pay

## 2019-10-25 NOTE — Telephone Encounter (Signed)
Patient returned phone call.  Patient reports of recent hospitalization for cellulitis, GI bleed and Hypotensive. Patient dialysis patient M,W, F and Saturday. Last dialysis treatment yesterday 10/24/19.   Patient states when he was admitted his lower legs had swelling, reports of today his thighs are twice his normal size and lower legs look to have improved. Patient unaware of ATP therapy. Patient currently reports of fatigue and shortness of breath with ambulation. States he uses a walker and after walking about 5 feet he has to stop and sit down. Reports of compliance with all medications.   Patient educated on shock plan and driving restrictions. Verbalizes understanding.   Attempted to Dr. Rayann Heman for recommendations, LMOVM. Will route to Dr. Aundra Dubin, triage and Dr. Rayann Heman as High Importance.

## 2019-10-25 NOTE — Telephone Encounter (Signed)
Patient returning call.

## 2019-10-25 NOTE — Telephone Encounter (Signed)
Received Merlin alert 10/24/19 at 04:52 AM for 1 episode of VT that was successfully terminated with ATP X3, then converted to SB 50's. Presenting rhythm 10/25/19 shows VS 80's. Per med list, Amiodarone 200 mg daily.    Called patient to assess/medication compliance. No answer. LMOVM.

## 2019-10-25 NOTE — Telephone Encounter (Signed)
He is already on amiodarone.  VT may be related to volume overload, sounds like he has a lot of fluid excess.  This will need to be removed by more aggressive dialysis.

## 2019-10-26 NOTE — Telephone Encounter (Signed)
Reviewed case with Dr. Lovena Le. No further recommendations at this time in regards to ICD.

## 2019-10-31 ENCOUNTER — Emergency Department (HOSPITAL_COMMUNITY): Payer: Medicare Other

## 2019-10-31 ENCOUNTER — Inpatient Hospital Stay (HOSPITAL_COMMUNITY)
Admission: EM | Admit: 2019-10-31 | Discharge: 2019-11-29 | DRG: 308 | Disposition: E | Payer: Medicare Other | Attending: Internal Medicine | Admitting: Internal Medicine

## 2019-10-31 ENCOUNTER — Encounter (HOSPITAL_COMMUNITY): Payer: Self-pay | Admitting: Emergency Medicine

## 2019-10-31 ENCOUNTER — Other Ambulatory Visit: Payer: Self-pay

## 2019-10-31 DIAGNOSIS — I2721 Secondary pulmonary arterial hypertension: Secondary | ICD-10-CM | POA: Diagnosis present

## 2019-10-31 DIAGNOSIS — R55 Syncope and collapse: Secondary | ICD-10-CM | POA: Diagnosis present

## 2019-10-31 DIAGNOSIS — Z951 Presence of aortocoronary bypass graft: Secondary | ICD-10-CM

## 2019-10-31 DIAGNOSIS — Z66 Do not resuscitate: Secondary | ICD-10-CM | POA: Diagnosis present

## 2019-10-31 DIAGNOSIS — I4729 Other ventricular tachycardia: Secondary | ICD-10-CM

## 2019-10-31 DIAGNOSIS — Z992 Dependence on renal dialysis: Secondary | ICD-10-CM | POA: Diagnosis not present

## 2019-10-31 DIAGNOSIS — I255 Ischemic cardiomyopathy: Secondary | ICD-10-CM | POA: Diagnosis present

## 2019-10-31 DIAGNOSIS — W1839XA Other fall on same level, initial encounter: Secondary | ICD-10-CM | POA: Diagnosis present

## 2019-10-31 DIAGNOSIS — I480 Paroxysmal atrial fibrillation: Secondary | ICD-10-CM

## 2019-10-31 DIAGNOSIS — D693 Immune thrombocytopenic purpura: Secondary | ICD-10-CM | POA: Diagnosis present

## 2019-10-31 DIAGNOSIS — Z7189 Other specified counseling: Secondary | ICD-10-CM | POA: Diagnosis not present

## 2019-10-31 DIAGNOSIS — Z9049 Acquired absence of other specified parts of digestive tract: Secondary | ICD-10-CM

## 2019-10-31 DIAGNOSIS — I472 Ventricular tachycardia, unspecified: Secondary | ICD-10-CM

## 2019-10-31 DIAGNOSIS — D500 Iron deficiency anemia secondary to blood loss (chronic): Secondary | ICD-10-CM

## 2019-10-31 DIAGNOSIS — Z515 Encounter for palliative care: Secondary | ICD-10-CM | POA: Diagnosis present

## 2019-10-31 DIAGNOSIS — N186 End stage renal disease: Secondary | ICD-10-CM | POA: Diagnosis present

## 2019-10-31 DIAGNOSIS — E782 Mixed hyperlipidemia: Secondary | ICD-10-CM | POA: Diagnosis present

## 2019-10-31 DIAGNOSIS — I132 Hypertensive heart and chronic kidney disease with heart failure and with stage 5 chronic kidney disease, or end stage renal disease: Secondary | ICD-10-CM | POA: Diagnosis present

## 2019-10-31 DIAGNOSIS — F419 Anxiety disorder, unspecified: Secondary | ICD-10-CM | POA: Diagnosis present

## 2019-10-31 DIAGNOSIS — I5042 Chronic combined systolic (congestive) and diastolic (congestive) heart failure: Secondary | ICD-10-CM | POA: Diagnosis not present

## 2019-10-31 DIAGNOSIS — B3781 Candidal esophagitis: Secondary | ICD-10-CM | POA: Diagnosis present

## 2019-10-31 DIAGNOSIS — Z87891 Personal history of nicotine dependence: Secondary | ICD-10-CM

## 2019-10-31 DIAGNOSIS — I959 Hypotension, unspecified: Secondary | ICD-10-CM | POA: Diagnosis not present

## 2019-10-31 DIAGNOSIS — D6859 Other primary thrombophilia: Secondary | ICD-10-CM | POA: Diagnosis present

## 2019-10-31 DIAGNOSIS — I083 Combined rheumatic disorders of mitral, aortic and tricuspid valves: Secondary | ICD-10-CM | POA: Diagnosis present

## 2019-10-31 DIAGNOSIS — N400 Enlarged prostate without lower urinary tract symptoms: Secondary | ICD-10-CM | POA: Diagnosis present

## 2019-10-31 DIAGNOSIS — D696 Thrombocytopenia, unspecified: Secondary | ICD-10-CM | POA: Diagnosis present

## 2019-10-31 DIAGNOSIS — Z95810 Presence of automatic (implantable) cardiac defibrillator: Secondary | ICD-10-CM | POA: Diagnosis not present

## 2019-10-31 DIAGNOSIS — I739 Peripheral vascular disease, unspecified: Secondary | ICD-10-CM | POA: Diagnosis present

## 2019-10-31 DIAGNOSIS — I5084 End stage heart failure: Secondary | ICD-10-CM | POA: Diagnosis present

## 2019-10-31 DIAGNOSIS — Z7901 Long term (current) use of anticoagulants: Secondary | ICD-10-CM

## 2019-10-31 DIAGNOSIS — M1A9XX0 Chronic gout, unspecified, without tophus (tophi): Secondary | ICD-10-CM | POA: Diagnosis present

## 2019-10-31 DIAGNOSIS — E785 Hyperlipidemia, unspecified: Secondary | ICD-10-CM | POA: Diagnosis present

## 2019-10-31 DIAGNOSIS — D649 Anemia, unspecified: Secondary | ICD-10-CM | POA: Diagnosis present

## 2019-10-31 DIAGNOSIS — I9589 Other hypotension: Secondary | ICD-10-CM | POA: Diagnosis present

## 2019-10-31 DIAGNOSIS — Z79899 Other long term (current) drug therapy: Secondary | ICD-10-CM

## 2019-10-31 DIAGNOSIS — I2510 Atherosclerotic heart disease of native coronary artery without angina pectoris: Secondary | ICD-10-CM | POA: Diagnosis present

## 2019-10-31 DIAGNOSIS — Z20822 Contact with and (suspected) exposure to covid-19: Secondary | ICD-10-CM | POA: Diagnosis present

## 2019-10-31 DIAGNOSIS — D631 Anemia in chronic kidney disease: Secondary | ICD-10-CM | POA: Diagnosis present

## 2019-10-31 DIAGNOSIS — Z8673 Personal history of transient ischemic attack (TIA), and cerebral infarction without residual deficits: Secondary | ICD-10-CM

## 2019-10-31 DIAGNOSIS — G2581 Restless legs syndrome: Secondary | ICD-10-CM | POA: Diagnosis present

## 2019-10-31 DIAGNOSIS — Z8249 Family history of ischemic heart disease and other diseases of the circulatory system: Secondary | ICD-10-CM

## 2019-10-31 DIAGNOSIS — I77810 Thoracic aortic ectasia: Secondary | ICD-10-CM | POA: Diagnosis present

## 2019-10-31 LAB — CBC
HCT: 29.8 % — ABNORMAL LOW (ref 39.0–52.0)
Hemoglobin: 9.4 g/dL — ABNORMAL LOW (ref 13.0–17.0)
MCH: 32.9 pg (ref 26.0–34.0)
MCHC: 31.5 g/dL (ref 30.0–36.0)
MCV: 104.2 fL — ABNORMAL HIGH (ref 80.0–100.0)
Platelets: 78 10*3/uL — ABNORMAL LOW (ref 150–400)
RBC: 2.86 MIL/uL — ABNORMAL LOW (ref 4.22–5.81)
RDW: 25.1 % — ABNORMAL HIGH (ref 11.5–15.5)
WBC: 7.6 10*3/uL (ref 4.0–10.5)
nRBC: 0 % (ref 0.0–0.2)

## 2019-10-31 LAB — TROPONIN I (HIGH SENSITIVITY)
Troponin I (High Sensitivity): <2 ng/L (ref <18)
Troponin I (High Sensitivity): 125 ng/L (ref <18)

## 2019-10-31 LAB — BASIC METABOLIC PANEL
Anion gap: 18 — ABNORMAL HIGH (ref 5–15)
BUN: 32 mg/dL — ABNORMAL HIGH (ref 8–23)
CO2: 24 mmol/L (ref 22–32)
Calcium: 8.5 mg/dL — ABNORMAL LOW (ref 8.9–10.3)
Chloride: 93 mmol/L — ABNORMAL LOW (ref 98–111)
Creatinine, Ser: 4.88 mg/dL — ABNORMAL HIGH (ref 0.61–1.24)
GFR calc Af Amer: 11 mL/min — ABNORMAL LOW (ref >60)
GFR calc non Af Amer: 10 mL/min — ABNORMAL LOW (ref >60)
Glucose, Bld: 149 mg/dL — ABNORMAL HIGH (ref 70–99)
Potassium: 4.2 mmol/L (ref 3.5–5.1)
Sodium: 135 mmol/L (ref 135–145)

## 2019-10-31 LAB — I-STAT CHEM 8, ED
BUN: 29 mg/dL — ABNORMAL HIGH (ref 8–23)
Calcium, Ion: 0.93 mmol/L — ABNORMAL LOW (ref 1.15–1.40)
Chloride: 94 mmol/L — ABNORMAL LOW (ref 98–111)
Creatinine, Ser: 5.2 mg/dL — ABNORMAL HIGH (ref 0.61–1.24)
Glucose, Bld: 151 mg/dL — ABNORMAL HIGH (ref 70–99)
HCT: 27 % — ABNORMAL LOW (ref 39.0–52.0)
Hemoglobin: 9.2 g/dL — ABNORMAL LOW (ref 13.0–17.0)
Potassium: 3.8 mmol/L (ref 3.5–5.1)
Sodium: 132 mmol/L — ABNORMAL LOW (ref 135–145)
TCO2: 25 mmol/L (ref 22–32)

## 2019-10-31 LAB — HEMOGLOBIN AND HEMATOCRIT, BLOOD
HCT: 28.4 % — ABNORMAL LOW (ref 39.0–52.0)
Hemoglobin: 8.6 g/dL — ABNORMAL LOW (ref 13.0–17.0)

## 2019-10-31 LAB — CBG MONITORING, ED: Glucose-Capillary: 153 mg/dL — ABNORMAL HIGH (ref 70–99)

## 2019-10-31 LAB — LIPASE, BLOOD: Lipase: 36 U/L (ref 11–51)

## 2019-10-31 LAB — SARS CORONAVIRUS 2 BY RT PCR (HOSPITAL ORDER, PERFORMED IN ~~LOC~~ HOSPITAL LAB): SARS Coronavirus 2: NEGATIVE

## 2019-10-31 LAB — MAGNESIUM: Magnesium: 2.1 mg/dL (ref 1.7–2.4)

## 2019-10-31 LAB — POC OCCULT BLOOD, ED: Fecal Occult Bld: POSITIVE — AB

## 2019-10-31 MED ORDER — TRAMADOL HCL 50 MG PO TABS
50.0000 mg | ORAL_TABLET | Freq: Two times a day (BID) | ORAL | Status: AC | PRN
  Administered 2019-11-01: 50 mg via ORAL
  Filled 2019-10-31: qty 1

## 2019-10-31 MED ORDER — SODIUM CHLORIDE 0.9 % IV BOLUS
500.0000 mL | Freq: Once | INTRAVENOUS | Status: AC
  Administered 2019-10-31: 500 mL via INTRAVENOUS

## 2019-10-31 MED ORDER — AMIODARONE HCL IN DEXTROSE 360-4.14 MG/200ML-% IV SOLN
30.0000 mg/h | INTRAVENOUS | Status: DC
  Administered 2019-10-31 – 2019-11-01 (×2): 30 mg/h via INTRAVENOUS
  Filled 2019-10-31 (×2): qty 200

## 2019-10-31 MED ORDER — ORAL CARE MOUTH RINSE
15.0000 mL | Freq: Two times a day (BID) | OROMUCOSAL | Status: DC
  Administered 2019-10-31 – 2019-11-03 (×6): 15 mL via OROMUCOSAL

## 2019-10-31 MED ORDER — ROSUVASTATIN CALCIUM 5 MG PO TABS
10.0000 mg | ORAL_TABLET | Freq: Every day | ORAL | Status: DC
  Administered 2019-11-01: 10 mg via ORAL
  Filled 2019-10-31: qty 2

## 2019-10-31 MED ORDER — PRAMIPEXOLE DIHYDROCHLORIDE 0.25 MG PO TABS
0.2500 mg | ORAL_TABLET | Freq: Two times a day (BID) | ORAL | Status: DC
  Administered 2019-10-31 – 2019-11-03 (×7): 0.25 mg via ORAL
  Filled 2019-10-31 (×10): qty 1

## 2019-10-31 MED ORDER — ACETAMINOPHEN 650 MG RE SUPP: 650.0000 mg | Freq: Four times a day (QID) | RECTAL | Status: DC | PRN

## 2019-10-31 MED ORDER — ACETAMINOPHEN 325 MG PO TABS
650.0000 mg | ORAL_TABLET | Freq: Four times a day (QID) | ORAL | Status: DC | PRN
  Administered 2019-10-31: 650 mg via ORAL
  Filled 2019-10-31: qty 2

## 2019-10-31 MED ORDER — RENA-VITE PO TABS
1.0000 | ORAL_TABLET | Freq: Every day | ORAL | Status: DC
  Administered 2019-10-31: 1 via ORAL
  Filled 2019-10-31 (×2): qty 1

## 2019-10-31 MED ORDER — SEVELAMER CARBONATE 800 MG PO TABS
800.0000 mg | ORAL_TABLET | Freq: Three times a day (TID) | ORAL | Status: DC
  Administered 2019-10-31 – 2019-11-01 (×2): 800 mg via ORAL
  Filled 2019-10-31 (×4): qty 1

## 2019-10-31 MED ORDER — MIDODRINE HCL 5 MG PO TABS
15.0000 mg | ORAL_TABLET | Freq: Three times a day (TID) | ORAL | Status: DC
  Administered 2019-10-31 – 2019-11-01 (×3): 15 mg via ORAL
  Filled 2019-10-31 (×4): qty 3

## 2019-10-31 MED ORDER — ALBUTEROL SULFATE (2.5 MG/3ML) 0.083% IN NEBU
2.5000 mg | INHALATION_SOLUTION | Freq: Four times a day (QID) | RESPIRATORY_TRACT | Status: DC | PRN
  Administered 2019-10-31: 2.5 mg via RESPIRATORY_TRACT
  Filled 2019-10-31: qty 3

## 2019-10-31 NOTE — Progress Notes (Signed)
The chaplain responded to trauma page. The patient was receiving trauma care and was not available for a visit. The chaplain is available if services are needed.  Brion Aliment Chaplain Resident For questions concerning this note please contact me by pager [phone]

## 2019-10-31 NOTE — ED Triage Notes (Signed)
Arrives to ED by POV pt states he was standing in his kitchen taking his BP before getting ready to go to dialysis. Pt states he then passed out and does not recall the events right before this just woke up on the ground lying on his left side. Pt does take eliquis. Pt to be taken to treatment room and activate a level 2 trauma due to fall on thinners unsure if hit head. Pt does have bruising to right forehead pt states " that is from another fall". Pt is alert and ox4.

## 2019-10-31 NOTE — ED Provider Notes (Signed)
[facility] Emergency Department Provider Note MRN:  [id number]  Arrival date & time: 11/05/2019     Chief Complaint   Syncope and fall History of Present Illness   Ricardo Payne is a 86 y.o. year-old male with a history of ESRD, CAD, pacemaker presenting to the ED with chief complaint of syncope and fall.  Patient was in the kitchen this morning checking his blood pressure prior to going to dialysis.  He goes to dialysis typically on Monday Wednesday and Friday but more recently has been also going on Saturday in order to get some extra fluid off at the instruction of his nephrologist.  He checked his blood pressure and saw that it was in the 66Y systolic and then the next thing he remembers he was on the ground.  Thinks he passed out.  Denies any warnings that he was going to pass out, no lightheadedness, no dizziness, no chest pain no other symptoms.  He is endorsing left posterior rib pain, denies neck or back pain, no chest pain or shortness of breath, no abdominal pain, does have bruising to the right side of his head.  Takes blood thinners.  No recent fever or illness.  Took his medicines this morning, including midodrine.  Review of Systems  A complete 10 system review of systems was obtained and all systems are negative except as noted in the HPI and PMH.   Patient's Health History    Past Medical History:  Diagnosis Date  . Acquired thrombophilia ([place])   . Age-related cataract of both eyes   . AICD (automatic cardioverter/defibrillator) present   . ASHD (arteriosclerotic heart disease)   . Benign prostatic hyperplasia   . CAD (coronary artery disease)    a. s/p CABG x1 in 1990  . Cerebellar stroke ([place]) 07/02/2016  . Chronic combined systolic and diastolic CHF (congestive heart failure) ([place])   . Chronic gout without tophus   . CKD (chronic kidney disease), stage IV ([place])   . CVA (cerebral vascular accident) ([place]) 2018  . Essential hypertension    . ICD (implantable cardioverter-defibrillator) in place   . Ischemic cardiomyopathy   . Mixed hyperlipidemia   . PAF (paroxysmal atrial fibrillation) ([place])   . Restless legs   . Secondary pulmonary arterial hypertension ([place])   . Thrombocytopenia ([place])   . Vitamin D deficiency     Past Surgical History:  Procedure Laterality Date  . CARDIOVERSION N/A 08/22/2019   Procedure: CARDIOVERSION;  Surgeon: Larey Dresser, MD;  Location: [facility] ENDOSCOPY;  Service: Cardiovascular;  Laterality: N/A;  . CHOLECYSTECTOMY    . CORONARY ARTERY BYPASS GRAFT    . ESOPHAGEAL BRUSHING  10/21/2019   Procedure: ESOPHAGEAL BRUSHING;  Surgeon: Wonda Horner, MD;  Location: [facility] ENDOSCOPY;  Service: Endoscopy;;  . ESOPHAGOGASTRODUODENOSCOPY (EGD) WITH PROPOFOL N/A 10/21/2019   Procedure: ESOPHAGOGASTRODUODENOSCOPY (EGD) WITH PROPOFOL;  Surgeon: Wonda Horner, MD;  Location: [facility] ENDOSCOPY;  Service: Endoscopy;  Laterality: N/A;  . IR FLUORO GUIDE CV LINE RIGHT  08/23/2019  . IR US GUIDE VASC ACCESS RIGHT  08/23/2019  . PACEMAKER IMPLANT  10/2018  . TEE WITHOUT CARDIOVERSION N/A 08/22/2019   Procedure: TRANSESOPHAGEAL ECHOCARDIOGRAM (TEE);  Surgeon: Larey Dresser, MD;  Location: [facility] ENDOSCOPY;  Service: Cardiovascular;  Laterality: N/A;    Family History  Problem Relation Age of Onset  . Hypertension Mother   . Suicidality Mother   . Valvular heart disease Father   . Heart disease Father   .  Heart attack Brother     Social History   Socioeconomic History  . Marital status: Widowed    Spouse name: Not on file  . Number of children: 2  . Years of education: Not on file  . Highest education level: Not on file  Occupational History  . Not on file  Tobacco Use  . Smoking status: Former Smoker    Quit date: 1988    Years since quitting: 33.4  . Smokeless tobacco: Never Used  Substance and Sexual Activity  . Alcohol use: Yes    Comment: rare  . Drug use: Never  . Sexual activity: Not on file  Other  Topics Concern  . Not on file  Social History Narrative  . Not on file   Social Determinants of Health   Financial Resource Strain:   . Difficulty of Paying Living Expenses:   Food Insecurity:   . Worried About Charity fundraiser in the Last Year:   . Arboriculturist in the Last Year:   Transportation Needs:   . Film/video editor (Medical):   Marland Kitchen Lack of Transportation (Non-Medical):   Physical Activity:   . Days of Exercise per Week:   . Minutes of Exercise per Session:   Stress:   . Feeling of Stress :   Social Connections:   . Frequency of Communication with Friends and Family:   . Frequency of Social Gatherings with Friends and Family:   . Attends Religious Services:   . Active Member of Clubs or Organizations:   . Attends Archivist Meetings:   Marland Kitchen Marital Status:   Intimate Partner Violence:   . Fear of Current or Ex-Partner:   . Emotionally Abused:   Marland Kitchen Physically Abused:   . Sexually Abused:      Physical Exam   Vitals:   11/26/2019 0934 11/11/2019 1015  BP: (!) 82/53 (!) 94/51  Pulse: 66 68  Resp: (!) 27 (!) 30  Temp:    SpO2: 100% 98%    CONSTITUTIONAL: Chronically ill-appearing, NAD NEURO:  Alert and oriented x 3, no focal deficits EYES:  eyes equal and reactive ENT/NECK:  no LAD, no JVD CARDIO: Regular rate, well-perfused, normal S1 and S2 PULM:  CTAB no wheezing or rhonchi GI/GU:  normal bowel sounds, non-distended, non-tender MSK/SPINE:  No gross deformities, no edema SKIN: Bruising to right brow PSYCH:  Appropriate speech and behavior  *Additional and/or pertinent findings included in MDM below  Diagnostic and Interventional Summary    EKG Interpretation  Date/Time:  Wednesday October 31 2019 07:21:54 EDT Ventricular Rate:  71 PR Interval:    QRS Duration: 153 QT Interval:  457 QTC Calculation: 497 R Axis:   -47 Text Interpretation: Accelerated junctional rhythm Left bundle branch block Confirmed by Gerlene Fee [phone]) on  11/12/2019 7:25:40 AM      Labs Reviewed  CBC - Abnormal; Notable for the following components:      Result Value   RBC 2.86 (*)    Hemoglobin 9.4 (*)    HCT 29.8 (*)    MCV 104.2 (*)    RDW 25.1 (*)    Platelets 78 (*)    All other components within normal limits  BASIC METABOLIC PANEL - Abnormal; Notable for the following components:   Chloride 93 (*)    Glucose, Bld 149 (*)    BUN 32 (*)    Creatinine, Ser 4.88 (*)    Calcium 8.5 (*)    GFR calc  non Af Amer 10 (*)    GFR calc Af Amer 11 (*)    Anion gap 18 (*)    All other components within normal limits  I-STAT CHEM 8, ED - Abnormal; Notable for the following components:   Sodium 132 (*)    Chloride 94 (*)    BUN 29 (*)    Creatinine, Ser 5.20 (*)    Glucose, Bld 151 (*)    Calcium, Ion 0.93 (*)    Hemoglobin 9.2 (*)    HCT 27.0 (*)    All other components within normal limits  CBG MONITORING, ED - Abnormal; Notable for the following components:   Glucose-Capillary 153 (*)    All other components within normal limits  POC OCCULT BLOOD, ED - Abnormal; Notable for the following components:   Fecal Occult Bld POSITIVE (*)    All other components within normal limits  CULTURE, BLOOD (SINGLE)  LIPASE, BLOOD  OCCULT BLOOD X 1 CARD TO LAB, STOOL  TROPONIN I (HIGH SENSITIVITY)  TROPONIN I (HIGH SENSITIVITY)    CT HEAD WO CONTRAST  Final Result    DG Chest Port 1 View  Final Result      Medications  sodium chloride 0.9 % bolus 500 mL (0 mLs Intravenous Stopped 11/15/2019 0844)     Procedures  /  Critical Care .Critical Care Performed by: Maudie Flakes, MD Authorized by: Maudie Flakes, MD   Critical care provider statement:    Critical care time (minutes):  45   Critical care was necessary to treat or prevent imminent or life-threatening deterioration of the following conditions:  Circulatory failure (Polymorphic ventricular tachycardia, syncope, hypotension)   Critical care was time spent personally by me on  the following activities:  Discussions with consultants, evaluation of patient's response to treatment, examination of patient, ordering and performing treatments and interventions, ordering and review of laboratory studies, ordering and review of radiographic studies, pulse oximetry, re-evaluation of patient's condition, obtaining history from patient or surrogate and review of old charts    ED Course and Medical Decision Making  I have reviewed the triage vital signs, the nursing notes, and pertinent available records from the EMR.  Listed above are laboratory and imaging tests that I personally ordered, reviewed, and interpreted and then considered in my medical decision making (see below for details).      Syncope without prodrome, hypotension.  Here in the ED he is well perfused on exam, conversant, afebrile, bruising to the right brow, left posterior rib pain.  Blood pressure 85/46 but is a dialysis patient that takes midodrine.  However looking at prior vital signs on record, he typically runs between 100 and 120.  He reports that his nephrologist has been more aggressive with fluid removal recently.  Will provide 500 cc bolus, obtain CT head, chest x-ray, monitor closely.  Suspect admission given that patient syncopized without prodrome.    Barth Kirks. Sedonia Small, [place] [email or web address]  Final Clinical Impressions(s) / ED Diagnoses     ICD-10-CM   1. Polymorphic ventricular tachycardia (HCC)  I47.2   2. Syncope, unspecified syncope type  R55   3. Hypotension, unspecified hypotension type  I95.9     ED Discharge Orders    None       Discharge Instructions Discussed with and Provided to Patient:   Discharge Instructions   None       Maudie Flakes, MD 11/21/2019 1042

## 2019-10-31 NOTE — Consult Note (Signed)
Renal Service Consult Note [facility]  Ricardo Payne 11/27/2019 Sol Blazing Requesting Physician:  Dr Tamala Julian, R.   Reason for Consult:  ESRD pt w/ syncope and VT  HPI: The patient is a 86 y.o. year-old w hx of pHTN, PAF, HL, ICM w ICD, sp CVA, HTN, ESRD on HD, gout, CAD sp CABG 1988-08-29 and BPH passed out in his kitchen this am while getting ready to go to his dialysis session.  Found on the floor , EMS called and in ED BP's were 75/45.  Pt's ICD was interrogated and showed polymorphic VT and 3 shocks. EP saw pt in ED and started amio load and noted pt is likely "end-stage" in terms of heart failure.   Pt was started on dialysis in April 2021 last month for progressive CKD/ cardiorenal syndrome.  TDC was placed and there was "no plan for permanent access" due to mult comorbidities and technical issues (ICD, etc).   Admitted 5/20- 10/23/19 for worsening bilat LE edema w/ cellulitis, chronic BP issues w/ inability to get vol down due to hypotension. rec'd IV abx.   Pt worked on [employer] as a Freight forwarder for 30 yrs, was married to his 1st wife then.  He remarried and his 2nd wife died in Aug 29, 2005.  Pt moved to [place] from [place] [facility] in Aug 30, 2018 at step-son's request to be closer by.  Lives alone here. Has another son in [place] and a daughter as well.   ROS  denies CP  no joint pain   no HA  no blurry vision  no rash  no diarrhea  no nausea/ vomiting     Past Medical History  Past Medical History:  Diagnosis Date  . Acquired thrombophilia ([place])   . Age-related cataract of both eyes   . AICD (automatic cardioverter/defibrillator) present   . ASHD (arteriosclerotic heart disease)   . Benign prostatic hyperplasia   . CAD (coronary artery disease)    a. s/p CABG x1 in 08-29-88  . Cerebellar stroke ([place]) 07/02/2016  . Chronic combined systolic and diastolic CHF (congestive heart failure) ([place])   . Chronic gout without tophus   . CKD (chronic kidney disease), stage IV ([place])   . CVA (cerebral  vascular accident) ([place]) Aug 29, 2016  . Essential hypertension   . ICD (implantable cardioverter-defibrillator) in place   . Ischemic cardiomyopathy   . Mixed hyperlipidemia   . PAF (paroxysmal atrial fibrillation) ([place])   . Restless legs   . Secondary pulmonary arterial hypertension ([place])   . Thrombocytopenia ([place])   . Vitamin D deficiency    Past Surgical History  Past Surgical History:  Procedure Laterality Date  . CARDIOVERSION N/A 08/22/2019   Procedure: CARDIOVERSION;  Surgeon: Larey Dresser, MD;  Location[facility] ENDOSCOPY;  Service: Cardiovascular;  Laterality: N/A;  . CHOLECYSTECTOMY    . CORONARY ARTERY BYPASS GRAFT    . ESOPHAGEAL BRUSHING  10/21/2019   Procedure: ESOPHAGEAL BRUSHING;  Surgeon: Wonda Horner, MD;  Location: [facility] ENDOSCOPY;  Service: Endoscopy;;  . ESOPHAGOGASTRODUODENOSCOPY (EGD) WITH PROPOFOL N/A 10/21/2019   Procedure: ESOPHAGOGASTRODUODENOSCOPY (EGD) WITH PROPOFOL;  Surgeon: Wonda Horner, MD;  Location: [facility] ENDOSCOPY;  Service: Endoscopy;  Laterality: N/A;  . IR FLUORO GUIDE CV LINE RIGHT  08/23/2019  . IR US GUIDE VASC ACCESS RIGHT  08/23/2019  . PACEMAKER IMPLANT  10/2018  . TEE WITHOUT CARDIOVERSION N/A 08/22/2019   Procedure: TRANSESOPHAGEAL ECHOCARDIOGRAM (TEE);  Surgeon: Larey Dresser, MD;  Location: [facility] ENDOSCOPY;  Service: Cardiovascular;  Laterality: N/A;   Family History  Family History  Problem Relation Age of Onset  . Hypertension Mother   . Suicidality Mother   . Valvular heart disease Father   . Heart disease Father   . Heart attack Brother    Social History  reports that he quit smoking about 33 years ago. He has never used smokeless tobacco. He reports current alcohol use. He reports that he does not use drugs. Allergies No Known Allergies Home medications Prior to Admission medications   Medication Sig Start Date End Date Taking? Authorizing Provider  acetaminophen (TYLENOL) 500 MG tablet Take 1,000 mg by mouth every 6 (six) hours as  needed for mild pain.   Yes [provider]  allopurinol (ZYLOPRIM) 100 MG tablet Take 0.5 tablets (50 mg total) by mouth every other day. 07/12/19  Yes [name], MD  amiodarone (PACERONE) 200 MG tablet Take 1 tablet (200 mg total) by mouth daily. 09/11/19  Yes Clegg, Amy D, NP  apixaban (ELIQUIS) 2.5 MG TABS tablet Take 1 tablet (2.5 mg total) by mouth 2 (two) times daily. 10/23/19  Yes Matcha, Anupama, MD  docusate sodium (COLACE) 50 MG capsule Take 50 mg by mouth daily.   Yes [provider]  fluconazole (DIFLUCAN) 100 MG tablet Take 1 tablet (100 mg total) by mouth as directed. Take 100mg  AFTER HD on HD days (MWFS), and 50mg  on non-HD days (TThSun) 10/23/19  Yes Matcha, Anupama, MD  fluconazole (DIFLUCAN) 50 MG tablet Take 1 tablet (50 mg total) by mouth as directed. Take 100mg  AFTER HD on HD days (MWFS), and 50mg  on non-HD days (TThSun) 10/23/19  Yes Matcha, Anupama, MD  L-Methylfolate-B6-B12 (FOLTANX) 3-35-2 MG TABS Take 1 tablet by mouth daily. 09/11/19  Yes O'Neal, Cassie Freer, MD  midodrine (PROAMATINE) 10 MG tablet Take 15 mg by mouth 3 (three) times daily. Taking  15mg  three times daily (1& 1/2 tablet)   Yes [provider]  multivitamin (RENA-VIT) TABS tablet Take 1 tablet by mouth at bedtime. 09/11/19  Yes Clegg, Amy D, NP  pantoprazole (PROTONIX) 40 MG tablet Take 1 tablet (40 mg total) by mouth 2 (two) times daily. 10/23/19 12/04/19 Yes Matcha, Anupama, MD  pramipexole (MIRAPEX) 0.25 MG tablet Take 0.25 mg by mouth 2 (two) times daily.   Yes [provider]  rosuvastatin (CRESTOR) 10 MG tablet Take 1 tablet (10 mg total) by mouth daily. 09/24/19 09/18/20 Yes Larey Dresser, MD  sevelamer (RENAGEL) 800 MG tablet Take 800 mg by mouth 3 (three) times daily with meals.   Yes [provider]  tamsulosin (FLOMAX) 0.4 MG CAPS capsule Take 1 capsule (0.4 mg total) by mouth daily. 07/21/19  Yes [name], MD  cephALEXin (KEFLEX) 500 MG capsule  Take 500 mg by mouth 4 (four) times daily. Completed regimen on 10-27-19 10/24/19   [provider]     Vitals:   11/27/2019 1300 11/12/2019 1315 11/03/2019 1330 11/15/2019 1345  BP: (!) 91/45 (!) 89/58 (!) 85/49 (!) 84/50  Pulse: 71 67 67 68  Resp: 19 (!) 26 (!) 24 14  Temp:      TempSrc:      SpO2: 97% 100% 100% 99%  Weight:      Height:       Exam Gen alert, no distress, 30 deg in ED bed No rash, cyanosis or gangrene Sclera anicteric, throat clear  No jvd or bruits Chest clear bilat to bases RRR no MRG Abd soft ntnd no mass  or ascites +bs GU normal male MS no joint effusions or deformity Ext chronic skin changes w/ bilat erythema pretib and 1-2+ edema bilat pretib Neuro is alert, Ox 3 , nf   Effinger - just changed to 4 days a week MWFSa 2 K 2.25 Ca t- lowering EDW to 89 5/31 but got below at 88.6 4 hr 400/800 2 K 2.25 Ca temp 36 profile 4 minimal net UF 3.5 (staff not being agressive enought about lowering edw) midodrine added hgb 8.4 5/26 - hgb steadily dropping since start was 11.7 - Mircera not started til 100 on 5/31 - no heparin no VDRA   OP HD: MWFSat      4h  89kg  400/800  2/2.25 bath  36deg  P4  Hep none  - midodrine added  - mircera started 100ug on 5/31  - no vdra       Assessment: 1. Syncope / polymorphic VT per ICD interrogation 2. Severe CM/ end-stage heart failure 3. ESRD - recent start to dialysis in April 2021.  4. Chronic vol overload  5. Chronic hypotension  Plan - pt is poor candidate for further dialysis given severe heart disease and arrhythmias making dialysis unsafe for further use. No further dialysis. Have d/w patient and his step-son, they both understand and have questions about hospice details. Will consult palliative care team. Have d/w primary MD.      Kelly Splinter  MD 11/25/2019, 3:03 PM  Recent Labs  Lab 11/20/2019 0726 11/05/2019 0726 11/21/2019 0746 11/27/2019 1315  WBC 7.6  --   --   --   HGB 9.4*   < > 9.2* 8.6*   < > =  values in this interval not displayed.   Recent Labs  Lab 11/08/2019 0726 11/11/2019 0746  K 4.2 3.8  BUN 32* 29*  CREATININE 4.88* 5.20*  CALCIUM 8.5*  --

## 2019-10-31 NOTE — ED Notes (Signed)
Pt set up with lunch/sandwich and a drink.  Alert, calm and pleasant.  Denies any pain or needs at this time.  Updated on POC and wait for bed assignment.  Pt currently on a hospital bed for comfort.  Able to reposition and use controls (I).

## 2019-10-31 NOTE — ED Notes (Signed)
Pt called out requesting oxygen for feeling SOB, 2L [place] applied for comfort

## 2019-10-31 NOTE — Progress Notes (Signed)
Discussed with [employer] rep will turn off morphology discriminator given therapy was delayed 2/2 to this.  Tommye Standard, PA-C

## 2019-10-31 NOTE — Progress Notes (Signed)
Orthopedic Tech Progress Note Patient Details:  Ricardo Payne 03-09-1931 [id number]  Patient ID: [name], male   DOB: [birth year], 86 y.o.   MRN: [id number] Level 2 trauma  Tori Dattilio A Andreanna Mikolajczak 11/28/2019, 7:31 AM

## 2019-10-31 NOTE — H&P (Addendum)
History and Physical    Ricardo Payne ZOX:[id number] DOB: [birth year] DOA: 11/20/2019  Referring MD/NP/PA: Gerlene Fee, MD PCP: [name], FNP  Patient coming from: Home via EMS  Chief Complaint: Syncope  I have personally briefly reviewed patient's old medical records in [place]   HPI: Ricardo Payne is a 86 y.o. male with medical history significant of HTN, HLD, CAD,  chronic combined systolic and diastolic CHF, s/p AICD, PAF on Eliquis, ESRD on HD, , and CVA who presents after having a syncopal event.  Son reportedly found him on the floor.  He denies having any chest pain or palpitation.  Patient had just recently been hospitalized for hypotension, cellulitis, GI bleed transfused 1 unit of blood.  EGD significant for gastric petechia with concern for esophageal candidiasis with plan to treat with 10 to 14 days of fluconazole.  Colonoscopy at that time had not been recommended due to advanced age.  ED Course: Upon admission into the emergency department patient was noted to be hypotensive down to 75/45 patient had received 500 mL of normal saline IV fluids with temporary improvement.  Labs significant for hemoglobin 9.4(hemoglobin 8.2 at discharge on 5/25), potassium 4.2, BUN 32, and creatinine 4.88.  Stool guaiacs were noted to be positive.  Patient's pacemaker was interrogated and noted to have episodes of polymorphic ventricular tachycardia for which patient was shocked x3.  EP cardiology was consulted plan on trying to load the patient with amiodarone.  TRH called to admit.    Review of Systems  Respiratory: Positive for shortness of breath.   Cardiovascular: Negative for chest pain and palpitations.  Neurological: Positive for loss of consciousness.    Past Medical History:  Diagnosis Date  . Acquired thrombophilia ([place])   . Age-related cataract of both eyes   . AICD (automatic cardioverter/defibrillator) present   . ASHD (arteriosclerotic heart disease)     . Benign prostatic hyperplasia   . CAD (coronary artery disease)    a. s/p CABG x1 in 1990  . Cerebellar stroke ([place]) 07/02/2016  . Chronic combined systolic and diastolic CHF (congestive heart failure) ([place])   . Chronic gout without tophus   . CKD (chronic kidney disease), stage IV ([place])   . CVA (cerebral vascular accident) ([place]) 2018  . Essential hypertension   . ICD (implantable cardioverter-defibrillator) in place   . Ischemic cardiomyopathy   . Mixed hyperlipidemia   . PAF (paroxysmal atrial fibrillation) ([place])   . Restless legs   . Secondary pulmonary arterial hypertension ([place])   . Thrombocytopenia ([place])   . Vitamin D deficiency     Past Surgical History:  Procedure Laterality Date  . CARDIOVERSION N/A 08/22/2019   Procedure: CARDIOVERSION;  Surgeon: Larey Dresser, MD;  Location: [facility] ENDOSCOPY;  Service: Cardiovascular;  Laterality: N/A;  . CHOLECYSTECTOMY    . CORONARY ARTERY BYPASS GRAFT    . ESOPHAGEAL BRUSHING  10/21/2019   Procedure: ESOPHAGEAL BRUSHING;  Surgeon: Wonda Horner, MD;  Location: [facility] ENDOSCOPY;  Service: Endoscopy;;  . ESOPHAGOGASTRODUODENOSCOPY (EGD) WITH PROPOFOL N/A 10/21/2019   Procedure: ESOPHAGOGASTRODUODENOSCOPY (EGD) WITH PROPOFOL;  Surgeon: Wonda Horner, MD;  Location: [facility] ENDOSCOPY;  Service: Endoscopy;  Laterality: N/A;  . IR FLUORO GUIDE CV LINE RIGHT  08/23/2019  . IR US GUIDE VASC ACCESS RIGHT  08/23/2019  . PACEMAKER IMPLANT  10/2018  . TEE WITHOUT CARDIOVERSION N/A 08/22/2019   Procedure: TRANSESOPHAGEAL ECHOCARDIOGRAM (TEE);  Surgeon: Larey Dresser, MD;  Location: [facility] ENDOSCOPY;  Service: Cardiovascular;  Laterality: N/A;     reports that he quit smoking about 33 years ago. He has never used smokeless tobacco. He reports current alcohol use. He reports that he does not use drugs.  No Known Allergies  Family History  Problem Relation Age of Onset  . Hypertension Mother   . Suicidality Mother   . Valvular heart disease Father   .  Heart disease Father   . Heart attack Brother     Prior to Admission medications   Medication Sig Start Date End Date Taking? Authorizing Provider  acetaminophen (TYLENOL) 500 MG tablet Take 1,000 mg by mouth every 6 (six) hours as needed for mild pain.   Yes [provider]  allopurinol (ZYLOPRIM) 100 MG tablet Take 0.5 tablets (50 mg total) by mouth every other day. 07/12/19  Yes [name], MD  amiodarone (PACERONE) 200 MG tablet Take 1 tablet (200 mg total) by mouth daily. 09/11/19  Yes Clegg, Amy D, NP  apixaban (ELIQUIS) 2.5 MG TABS tablet Take 1 tablet (2.5 mg total) by mouth 2 (two) times daily. 10/23/19  Yes Matcha, Anupama, MD  docusate sodium (COLACE) 50 MG capsule Take 50 mg by mouth daily.   Yes [provider]  fluconazole (DIFLUCAN) 100 MG tablet Take 1 tablet (100 mg total) by mouth as directed. Take 100mg  AFTER HD on HD days (MWFS), and 50mg  on non-HD days (TThSun) 10/23/19  Yes Matcha, Anupama, MD  fluconazole (DIFLUCAN) 50 MG tablet Take 1 tablet (50 mg total) by mouth as directed. Take 100mg  AFTER HD on HD days (MWFS), and 50mg  on non-HD days (TThSun) 10/23/19  Yes Matcha, Anupama, MD  L-Methylfolate-B6-B12 (FOLTANX) 3-35-2 MG TABS Take 1 tablet by mouth daily. 09/11/19  Yes O'Neal, Cassie Freer, MD  midodrine (PROAMATINE) 10 MG tablet Take 15 mg by mouth 3 (three) times daily. Taking  15mg  three times daily (1& 1/2 tablet)   Yes [provider]  multivitamin (RENA-VIT) TABS tablet Take 1 tablet by mouth at bedtime. 09/11/19  Yes Clegg, Amy D, NP  pantoprazole (PROTONIX) 40 MG tablet Take 1 tablet (40 mg total) by mouth 2 (two) times daily. 10/23/19 12/04/19 Yes Matcha, Anupama, MD  pramipexole (MIRAPEX) 0.25 MG tablet Take 0.25 mg by mouth 2 (two) times daily.   Yes [provider]  rosuvastatin (CRESTOR) 10 MG tablet Take 1 tablet (10 mg total) by mouth daily. 09/24/19 09/18/20 Yes Larey Dresser, MD  sevelamer (RENAGEL) 800 MG tablet Take 800  mg by mouth 3 (three) times daily with meals.   Yes [provider]  tamsulosin (FLOMAX) 0.4 MG CAPS capsule Take 1 capsule (0.4 mg total) by mouth daily. 07/21/19  Yes [name], MD  cephALEXin (KEFLEX) 500 MG capsule Take 500 mg by mouth 4 (four) times daily. Completed regimen on 10-27-19 10/24/19   [provider]    Physical Exam:  Constitutional: Elderly male who appears to be in no acute distress at this time. Vitals:   11/17/2019 0815 11/12/2019 0830 11/25/2019 0934 11/13/2019 1015  BP: 90/64 (!) 89/51 (!) 82/53 (!) 94/51  Pulse: 70 71 66 68  Resp: (!) 25 (!) 23 (!) 27 (!) 30  Temp:      TempSrc:      SpO2: 97% 96% 100% 98%  Weight:      Height:       Eyes: PERRL, lids and conjunctivae normal ENMT: Mucous membranes are moist. Posterior pharynx clear of any exudate or lesions.  Neck:  normal, supple, no masses, no thyromegaly.  No JVD. Respiratory: Patient with decreased overall aeration without signs of wheezing.  Currently on 3 L nasal cannula oxygen with O2 saturations maintained. Cardiovascular: Regular rate and rhythm, no murmurs / rubs / gallops.  +1 pitting edema of the bilateral lower extremity. 2+ pedal pulses. No carotid bruits.  Right chest wall catheter in place. Abdomen: no tenderness, no masses palpated. No hepatosplenomegaly. Bowel sounds positive.  Musculoskeletal: no clubbing / cyanosis. No joint deformity upper and lower extremities. Good ROM, no contractures. Normal muscle tone.  Skin: no rashes, lesions, ulcers. No induration Neurologic: CN 2-12 grossly intact. Sensation intact, DTR normal. Strength 5/5 in all 4.  Psychiatric: Normal judgment and insight. Alert and oriented x 3. Normal mood.     Labs on Admission: I have personally reviewed following labs and imaging studies  CBC: Recent Labs  Lab 11/18/2019 0726 10/30/2019 0746  WBC 7.6  --   HGB 9.4* 9.2*  HCT 29.8* 27.0*  MCV 104.2*  --   PLT 78*  --    Basic Metabolic Panel: Recent  Labs  Lab 11/20/2019 0726 11/08/2019 0746  NA 135 132*  K 4.2 3.8  CL 93* 94*  CO2 24  --   GLUCOSE 149* 151*  BUN 32* 29*  CREATININE 4.88* 5.20*  CALCIUM 8.5*  --    GFR: Estimated Creatinine Clearance: 10.3 mL/min (A) (by C-G formula based on SCr of 5.2 mg/dL (H)). Liver Function Tests: No results for input(s): AST, ALT, ALKPHOS, BILITOT, PROT, ALBUMIN in the last 168 hours. Recent Labs  Lab 11/01/2019 0726  LIPASE 36   No results for input(s): AMMONIA in the last 168 hours. Coagulation Profile: No results for input(s): INR, PROTIME in the last 168 hours. Cardiac Enzymes: No results for input(s): CKTOTAL, CKMB, CKMBINDEX, TROPONINI in the last 168 hours. BNP (last 3 results) Recent Labs    05/21/19 1535  PROBNP 6,191*   HbA1C: No results for input(s): HGBA1C in the last 72 hours. CBG: Recent Labs  Lab 11/26/2019 0734  GLUCAP 153*   Lipid Profile: No results for input(s): CHOL, HDL, LDLCALC, TRIG, CHOLHDL, LDLDIRECT in the last 72 hours. Thyroid Function Tests: No results for input(s): TSH, T4TOTAL, FREET4, T3FREE, THYROIDAB in the last 72 hours. Anemia Panel: No results for input(s): VITAMINB12, FOLATE, FERRITIN, TIBC, IRON, RETICCTPCT in the last 72 hours. Urine analysis:    Component Value Date/Time   COLORURINE YELLOW 08/20/2019 Normal 08/20/2019 1608   LABSPEC 1.010 08/20/2019 1608   PHURINE 5.0 08/20/2019 1608   GLUCOSEU NEGATIVE 08/20/2019 1608   HGBUR NEGATIVE 08/20/2019 1608   BILIRUBINUR NEGATIVE 08/20/2019 1608   [place] 08/20/2019 1608   PROTEINUR NEGATIVE 08/20/2019 1608   NITRITE NEGATIVE 08/20/2019 1608   LEUKOCYTESUR NEGATIVE 08/20/2019 1608   Sepsis Labs: No results found for this or any previous visit (from the past 240 hour(s)).   Radiological Exams on Admission: CT HEAD WO CONTRAST  Result Date: 11/16/2019 CLINICAL DATA:  Syncope, head trauma EXAM: CT HEAD WITHOUT CONTRAST TECHNIQUE: Contiguous axial images  were obtained from the base of the skull through the vertex without intravenous contrast. COMPARISON:  06/27/2019 FINDINGS: Brain: No evidence of acute infarction, hemorrhage, extra-axial collection, ventriculomegaly, or mass effect. Bilateral subdural hygromas. Generalized cerebral atrophy. Periventricular white matter low attenuation likely secondary to microangiopathy. Vascular: Cerebrovascular atherosclerotic calcifications are noted. Skull: Negative for fracture or focal lesion. Sinuses/Orbits: Visualized portions of the orbits are unremarkable. Visualized portions of  the paranasal sinuses are unremarkable. Visualized portions of the mastoid air cells are unremarkable. Other: None. IMPRESSION: 1. No acute intracranial pathology. 2. Chronic microvascular ischemic changes and cerebral atrophy. Electronically Signed   By: Kathreen Devoid   On: 11/19/2019 08:26   DG Chest Port 1 View  Result Date: 11/27/2019 CLINICAL DATA:  Fall on blood thinners with rib pain on the left EXAM: PORTABLE CHEST 1 VIEW COMPARISON:  10/28/2019 FINDINGS: Cardiomegaly. Single chamber ICD into the right ventricle. Dialysis catheter on the right with tip at the upper cavoatrial junction. Prior median sternotomy. Mild pulmonary scarring. There is no edema, consolidation, effusion, or pneumothorax. IMPRESSION: Stable compared to prior.  No evidence of acute disease. Electronically Signed   By: Monte Fantasia M.D.   On: 11/09/2019 07:45    EKG: Independently reviewed.  Junctional rhythm at 71 bpm with QTc 497 LBBB  Assessment/Plan Syncope and collapse Polymorphic V. Tach AICD in place: Acute.  Patient presents after having syncopal episode.  Interrogation of pacemaker revealed polymorphic ventricular tachycardia patient receiving 3 shocks.  QT was noted to be prolonged at 497.  Patient was seen by Dr. Curt Bears of EP recommended to be loaded with amiodarone.  Overall patient noted to be in end-stage heart failure.  Limited options at  this time with poor overall prognosis given decreased ability for patient to tolerate hemodialysis for which nephrology recommended consulting hospice. -Admit to a progressive bed  -Correct electrolyte abnormality -Amiodarone per cards -Appreciate cardiology consultative services, we will follow for any other recommendations -Palliative care consulted follow-up recommendation  Hypotension: Acute on chronic.  Patient on midodrine 15 mg 3 times daily.  Initial blood pressures noted to be as low as 74/55 even after receiving 500 mL fluid bolus.   -Continue midodrine -Give additional 500 mg IV fluid bolus -Continue to monitor at this time  Paroxysmal atrial fibrillation on chronic anticoagulation: Patient had been okayed after EGD to restart Eliquis.  CHA2DS2-VASc score = 6. -Hold Eliquis -Goal magnesium 2 and potassium 4  Chronic combined systolic and diastolic CHF: Chest x-ray was otherwise noted to be stable.  Last EF noted to be less than 20% with global hypokinesis.  Patient with some lower extremity edema appreciated, but does not appear grossly fluid overloaded.  Cardiology on board and following. -Strict intake and output -Daily weight  Anemia/question GI bleed: Hemoglobin 9.4->8.6 on admission which appears improved from previous at discharge of 8.4 on 5/25.  Stool guaiacs however noted to still be positive.  During last admission patient had EGD for which there was concern for gastric petechiae and possible oral candidiasis.  Colonoscopy was not pursued due to patient's advanced age and medical problems. -Recheck H&H   Esophageal candidiasis: Patient had been on fluconazole, but his medications need to be discontinued as it could have been putting patient at risk for going into VT.  -Hold Diflucan  Thrombocytopenia: Chronic. Platelet count 78 which appears near patient baseline -Continue to monitor  ESRD on HD: Patient normally dialyzes Monday, Wednesday, Friday, and Saturday.   Labs significant for potassium 4.2, BUN 32, and creatinine 4.88.  Right chest catheter in place. -Continue current home medications -Nephrology notified of the patient's admission, but deferred any further HD treatments at this time due to patient's overall poor prognosis.  DNR: Present on admission.  Due to limited options at this time overall patient   DVT prophylaxis: SCD Code Status: DNR Family Communication: Son to be updated over phone. Disposition Plan: To be determined  Consults called: Dr. Jonnie Finner -nephrology, Dr. Earlyne Iba cardiology Admission status: Inpatient  Norval Morton MD [facility] Pager [phone]   If 7PM-7AM, please contact night-coverage [email or web address] Password [facility]  10/30/2019, 10:29 AM

## 2019-10-31 NOTE — Consult Note (Addendum)
Cardiology Consultation:   Patient ID: Ricardo Payne MRN: [id number]; DOB: 1931-04-30  Admit date: 11/10/2019 Date of Consult: 11/02/2019  Primary Care Provider: [name], FNP Primary Cardiologist: Donato Heinz, MD  Primary Electrophysiologist:  Cristopher Peru, MD     Patient Profile:   DAMARIS GEERS is a 86 y.o. male with a hx of ESRF on HD, HTN (with recent relative hypotension on midodrine), HLD, CAD (CABG 1990), ? H/o PVD, CVA, chronic CHF (systolic/diastolic), ITP, PAFib,  ICM w/ICD, gout who is being seen today for the evaluation of VT/shocks at the request of Dr. Sedonia Small.  History of Present Illness:   Mr. Revoir was recently hospitalized with GIB, He sought attention for worsening LE swelling, continued pain to LE despite antibiotics for cellulitis, change in stool, and worsening hypotension. During his stay, his Hgb dropped to as low as 6.8 was transfused.  EGD noted plaques suspicious for candidiasis, gastric petechiae, started on Pantoprazole 40 mg po bid x 6 weeks, then 40 mg po qd thereafter indefinitely and fluconazole and plan to treat for duration of 10-14 days.  GI did not recommend colonoscopy given advanced age and comorbidities Thrombocytopenia noted, and stable GI cleared to resume his home eliquis Celluliltis discharged with Keflex discharged 10/23/2019.   He comes to [facility] t oday after syncopal event at home.  He was getting ready to go to dialysis, had gotten up, dressed as usual, was taking his BP noted that it was 60/? And that is the last thing he recalls.  His sone found him on the kitchen floor.  He reports no CP, palpitations or cardiac awareness pre or post syncope, he was unaware of being shocked.  LABS K+ 4.2 > 3.8 BUN/Creat 29/5.20 HS Trop <2 >> 125 H/H 9.2/27/0 WBC 7.6 Plts 78   Device information SJM single chamber ICD, implanted 03/17/2018 Merlin transmission from today Battery and lead measurements are good Today  05:45 VF episode Unable to see onset PMVT >>> ATP during charge unsuccessful > shock >> VF >> shock >> MMVT >>shock >> paced rhythm NOTE: that the device used morphology discriminator labeled SVT within the episode He also had 2 NSVT episodes were PMVT, both 6 seconds  On 10/24/19 at 04:49AM, he had VT epsiode MMVT > ATP >> PMVT >>> ATP failed >> VF >> spontaneous break to VP rhythm, charge dumped and shock aborted    Past Medical History:  Diagnosis Date   Acquired thrombophilia ([place])    Age-related cataract of both eyes    AICD (automatic cardioverter/defibrillator) present    ASHD (arteriosclerotic heart disease)    Benign prostatic hyperplasia    CAD (coronary artery disease)    a. s/p CABG x1 in 1990   Cerebellar stroke ([place]) 07/02/2016   Chronic combined systolic and diastolic CHF (congestive heart failure) (HCC)    Chronic gout without tophus    CKD (chronic kidney disease), stage IV (HCC)    CVA (cerebral vascular accident) ([place]) 2018   Essential hypertension    ICD (implantable cardioverter-defibrillator) in place    Ischemic cardiomyopathy    Mixed hyperlipidemia    PAF (paroxysmal atrial fibrillation) (HCC)    Restless legs    Secondary pulmonary arterial hypertension ([place])    Thrombocytopenia ([place])    Vitamin D deficiency     Past Surgical History:  Procedure Laterality Date   CARDIOVERSION N/A 08/22/2019   Procedure: CARDIOVERSION;  Surgeon: Larey Dresser, MD;  Location: [facility] ENDOSCOPY;  Service: Cardiovascular;  Laterality: N/A;   CHOLECYSTECTOMY     CORONARY ARTERY BYPASS GRAFT     ESOPHAGEAL BRUSHING  10/21/2019   Procedure: ESOPHAGEAL BRUSHING;  Surgeon: Wonda Horner, MD;  Location: [facility] ENDOSCOPY;  Service: Endoscopy;;   ESOPHAGOGASTRODUODENOSCOPY (EGD) WITH PROPOFOL N/A 10/21/2019   Procedure: ESOPHAGOGASTRODUODENOSCOPY (EGD) WITH PROPOFOL;  Surgeon: Wonda Horner, MD;  Location: [facility] ENDOSCOPY;  Service: Endoscopy;  Laterality: N/A;   IR FLUORO GUIDE CV  LINE RIGHT  08/23/2019   IR US GUIDE VASC ACCESS RIGHT  08/23/2019   PACEMAKER IMPLANT  10/2018   TEE WITHOUT CARDIOVERSION N/A 08/22/2019   Procedure: TRANSESOPHAGEAL ECHOCARDIOGRAM (TEE);  Surgeon: Larey Dresser, MD;  Location: [facility] ENDOSCOPY;  Service: Cardiovascular;  Laterality: N/A;     Home Medications:  Prior to Admission medications   Medication Sig Start Date End Date Taking? Authorizing Provider  acetaminophen (TYLENOL) 500 MG tablet Take 1,000 mg by mouth every 6 (six) hours as needed for mild pain.   Yes [provider]  allopurinol (ZYLOPRIM) 100 MG tablet Take 0.5 tablets (50 mg total) by mouth every other day. 07/12/19  Yes [name], MD  amiodarone (PACERONE) 200 MG tablet Take 1 tablet (200 mg total) by mouth daily. 09/11/19  Yes Clegg, Amy D, NP  apixaban (ELIQUIS) 2.5 MG TABS tablet Take 1 tablet (2.5 mg total) by mouth 2 (two) times daily. 10/23/19  Yes Matcha, Anupama, MD  docusate sodium (COLACE) 50 MG capsule Take 50 mg by mouth daily.   Yes [provider]  fluconazole (DIFLUCAN) 100 MG tablet Take 1 tablet (100 mg total) by mouth as directed. Take 100mg  AFTER HD on HD days (MWFS), and 50mg  on non-HD days (TThSun) 10/23/19  Yes Matcha, Anupama, MD  fluconazole (DIFLUCAN) 50 MG tablet Take 1 tablet (50 mg total) by mouth as directed. Take 100mg  AFTER HD on HD days (MWFS), and 50mg  on non-HD days (TThSun) 10/23/19  Yes Matcha, Anupama, MD  L-Methylfolate-B6-B12 (FOLTANX) 3-35-2 MG TABS Take 1 tablet by mouth daily. 09/11/19  Yes O'Neal, Cassie Freer, MD  midodrine (PROAMATINE) 10 MG tablet Take 15 mg by mouth 3 (three) times daily. Taking  15mg  three times daily (1& 1/2 tablet)   Yes [provider]  multivitamin (RENA-VIT) TABS tablet Take 1 tablet by mouth at bedtime. 09/11/19  Yes Clegg, Amy D, NP  pantoprazole (PROTONIX) 40 MG tablet Take 1 tablet (40 mg total) by mouth 2 (two) times daily. 10/23/19 12/04/19 Yes Matcha, Anupama, MD  pramipexole  (MIRAPEX) 0.25 MG tablet Take 0.25 mg by mouth 2 (two) times daily.   Yes [provider]  rosuvastatin (CRESTOR) 10 MG tablet Take 1 tablet (10 mg total) by mouth daily. 09/24/19 09/18/20 Yes Larey Dresser, MD  sevelamer (RENAGEL) 800 MG tablet Take 800 mg by mouth 3 (three) times daily with meals.   Yes [provider]  tamsulosin (FLOMAX) 0.4 MG CAPS capsule Take 1 capsule (0.4 mg total) by mouth daily. 07/21/19  Yes [name], MD  cephALEXin (KEFLEX) 500 MG capsule Take 500 mg by mouth 4 (four) times daily. Completed regimen on 10-27-19 10/24/19   [provider]    Inpatient Medications: Scheduled Meds:  Continuous Infusions:  PRN Meds:   Allergies:   No Known Allergies  Social History:   Social History   Socioeconomic History   Marital status: Widowed    Spouse name: Not on file   Number of children: 2   Years of education: Not on file  Highest education level: Not on file  Occupational History   Not on file  Tobacco Use   Smoking status: Former Smoker    Quit date: 1988    Years since quitting: 33.4   Smokeless tobacco: Never Used  Substance and Sexual Activity   Alcohol use: Yes    Comment: rare   Drug use: Never   Sexual activity: Not on file  Other Topics Concern   Not on file  Social History Narrative   Not on file   Social Determinants of Health   Financial Resource Strain:    Difficulty of Paying Living Expenses:   Food Insecurity:    Worried About Charity fundraiser in the Last Year:    Arboriculturist in the Last Year:   Transportation Needs:    Film/video editor (Medical):    Lack of Transportation (Non-Medical):   Physical Activity:    Days of Exercise per Week:    Minutes of Exercise per Session:   Stress:    Feeling of Stress :   Social Connections:    Frequency of Communication with Friends and Family:    Frequency of Social Gatherings with Friends and Family:    Attends Religious Services:     Active Member of Clubs or Organizations:    Attends Music therapist:    Marital Status:   Intimate Partner Violence:    Fear of Current or Ex-Partner:    Emotionally Abused:    Physically Abused:    Sexually Abused:     Family History:   Family History  Problem Relation Age of Onset   Hypertension Mother    Suicidality Mother    Valvular heart disease Father    Heart disease Father    Heart attack Brother      ROS:  Please see the history of present illness.  All other ROS reviewed and negative.     Physical Exam/Data:   Vitals:   11/05/2019 0934 11/06/2019 1015 11/07/2019 1045 11/19/2019 1100  BP: (!) 82/53 (!) 94/51 (!) 92/49 (!) 86/53  Pulse: 66 68 69 66  Resp: (!) 27 (!) 30 18 (!) 22  Temp:      TempSrc:      SpO2: 100% 98% 98% 97%  Weight:      Height:        Intake/Output Summary (Last 24 hours) at 11/17/2019 1135 Last data filed at 11/19/2019 0844 Gross per 24 hour  Intake 500 ml  Output 0 ml  Net 500 ml   Last 3 Weights 11/22/2019 10/23/2019 10/22/2019  Weight (lbs) 190 lb 201 lb 14.4 oz 199 lb 4.7 oz  Weight (kg) 86.183 kg 91.581 kg 90.4 kg     Body mass index is 30.67 kg/m.   Pt examined by Dr. Curt Bears  General:  Well nourished, well developed, in no acute distress HEENT: normal Lymph: no adenopathy Neck: no JVD Endocrine:  No thryomegaly Vascular: No carotid bruits Cardiac:  RRR; no murmurs, gallops or rubs, R chest catheter Lungs:  Diminished at the bases, no wheezing, rhonchi or rales  Abd: soft, nontender  Ext: marked LE edema Musculoskeletal:  No deformities, age appropriate atrophy Skin: warm and dry  Neuro:  No gross focal abnormalities noted Psych:  Normal affect   EKG:  The EKG was personally reviewed and demonstrates:   SR, no acute/ischemic looking changes, LBBB (no significant change from last)  Telemetry:  Telemetry was personally reviewed and demonstrates:   SR  Relevant CV Studies:  10/18/2019: LE venous US BILATERAL:    - No evidence of deep vein thrombosis seen in the lower extremities,  bilaterally.    08/22/2019: TEE IMPRESSIONS  1. Left ventricular ejection fraction, by estimation, is <20%. The left  ventricle has severely decreased function. The left ventricle demonstrates  global hypokinesis. The left ventricular internal cavity size was mildly  dilated.   2. Right ventricular systolic function is moderately reduced. The right  ventricular size is moderately enlarged. There is moderately elevated  pulmonary artery systolic pressure.   3. No ASD or PFO by color doppler. Left atrial size was moderately  dilated. No left atrial/left atrial appendage thrombus was detected.   4. Right atrial size was moderately dilated.   5. Peak RV-RA gradient 36 mmHg. Tricuspid valve regurgitation is severe.   6. Moderately calcified aortic valve with trivial regurgitation. There  was no significant aortic stenosis, mean gradient 8 mmHg.   7. Moderate mitral regurgitation with restricted posterior mitral  leaflet. . The mitral valve is abnormal. Moderate mitral valve  regurgitation.   8. Normal caliber thoracic aorta with grade 3 plaque.    07/09/2019: TTE IMPRESSIONS  1. Left ventricular ejection fraction, by estimation, is <20%. The left  ventricle has severely decreased function. The left ventrical demonstrates  global hypokinesis. The left ventricular internal cavity size was mildly  dilated. Left ventricular  diastolic parameters are indeterminate.   2. Right ventricular systolic function is moderately reduced. The right  ventricular size is mildly enlarged. There is moderately elevated  pulmonary artery systolic pressure. The estimated right ventricular  systolic pressure is 65.7 mmHg.   3. Mild to moderate mitral valve regurgitation.   4. Tricuspid valve regurgitation is severe.   5. The aortic valve is tricuspid. Mild aortic valve stenosis, mean  gradient only 6 mmHg but visually at least mild AS. No  aortic  insufficiency.   6. Aortic dilatation noted. There is mild dilatation of the aortic root  measuring 41 mm.   7. Dilated IVC suggesting RA pressure 15 mmHg.   8. Rhythm difficult to discern, may be atrial fibrillation.     Laboratory Data:  High Sensitivity Troponin:   Recent Labs  Lab 11/23/2019 0726 11/15/2019 0943  TROPONINIHS <2 125*     Chemistry Recent Labs  Lab 11/24/2019 0726 11/13/2019 0746  NA 135 132*  K 4.2 3.8  CL 93* 94*  CO2 24  --   GLUCOSE 149* 151*  BUN 32* 29*  CREATININE 4.88* 5.20*  CALCIUM 8.5*  --   GFRNONAA 10*  --   GFRAA 11*  --   ANIONGAP 18*  --     No results for input(s): PROT, ALBUMIN, AST, ALT, ALKPHOS, BILITOT in the last 168 hours. Hematology Recent Labs  Lab 11/22/2019 0726 11/03/2019 0746  WBC 7.6  --   RBC 2.86*  --   HGB 9.4* 9.2*  HCT 29.8* 27.0*  MCV 104.2*  --   MCH 32.9  --   MCHC 31.5  --   RDW 25.1*  --   PLT 78*  --    BNPNo results for input(s): BNP, PROBNP in the last 168 hours.  DDimer No results for input(s): DDIMER in the last 168 hours.   Radiology/Studies:  CT HEAD WO CONTRAST Result Date: 11/12/2019 CLINICAL DATA:  Syncope, head trauma EXAM: CT HEAD WITHOUT CONTRAST TECHNIQUE: Contiguous axial images were obtained from the base of the skull through the vertex without intravenous contrast. COMPARISON:  06/27/2019 FINDINGS: Brain: No evidence of acute infarction, hemorrhage, extra-axial collection, ventriculomegaly, or mass effect. Bilateral subdural hygromas. Generalized cerebral atrophy. Periventricular white matter low attenuation likely secondary to microangiopathy. Vascular: Cerebrovascular atherosclerotic calcifications are noted. Skull: Negative for fracture or focal lesion. Sinuses/Orbits: Visualized portions of the orbits are unremarkable. Visualized portions of the paranasal sinuses are unremarkable. Visualized portions of the mastoid air cells are unremarkable. Other: None. IMPRESSION: 1. No acute  intracranial pathology. 2. Chronic microvascular ischemic changes and cerebral atrophy. Electronically Signed   By: Kathreen Devoid   On: 11/03/2019 08:26    DG Chest Port 1 View Result Date: 11/01/2019 CLINICAL DATA:  Fall on blood thinners with rib pain on the left EXAM: PORTABLE CHEST 1 VIEW COMPARISON:  10/28/2019 FINDINGS: Cardiomegaly. Single chamber ICD into the right ventricle. Dialysis catheter on the right with tip at the upper cavoatrial junction. Prior median sternotomy. Mild pulmonary scarring. There is no edema, consolidation, effusion, or pneumothorax. IMPRESSION: Stable compared to prior.  No evidence of acute disease. Electronically Signed   By: Monte Fantasia M.D.   On: 11/06/2019 07:45   { Assessment and Plan:   1. VT with ICD shocks     Check mag, K+ looks OK     Unable to see onset of events  QT does not look too long, though some of his VT was PMVT, please avoid antibiotics/antifungals that can contribute to QT prolongation given his amiodarone  Keep Mag >2.0 and K+ >4.0  Dr. Curt Bears has seen and examined the patient His BP is fairly low at baseline, 80's SBP in the ER Johndaniel Catlin see if he can tolerate amiodarone 30mg /hr, if not Nadira Single plan PO re-load (started April 2021 during HF hospitalization) BP Paullette Mckain continue to make fluid management difficult  Dr. Curt Bears discussed with Dr. Aundra Dubin, they Jadie Comas as well see the patient   2. PAFib     CHA2DS2Vasc is at least 6, on Eliquis     Is in SR     If able continue Eliquis, though pending IM evaluation, if felt any ongoing active/recurrent GI bleed and need to hold     D/w ER MD, hospitalist team Chancie Lampert admit given multiple medical problems Recent GI Bleed with on goiong heme + stool, though H/H looks OK. Recent cellulitis ESRF/HD        For questions or updates, please contact [place] Please consult [email or web address] for contact info under     Signed, Baldwin Jamaica, PA-C  11/24/2019 11:35 AM  I have seen and  examined this patient with Tommye Standard.  Agree with above, note added to reflect my findings.  On exam, regular rhythm, no murmurs, 2+ lower extremity edema. Patient presented to the hospital with ventricular tachycardia status post ICD shocks x3. He was getting ready to go to dialysis when he had an episode of syncope caused by his VT. He has a history of an ischemic cardiomyopathy and is followed by the heart failure service. He is currently on dialysis recently started. He also takes midodrine for low blood pressures. Currently he feels well without complaint. He was unaware of his ICD shocks. He also tells me that he does not recall ICD shocks in the past. He is on amiodarone, but this appears to be due to atrial fibrillation. He does not appear ischemic, as his troponin was not significantly elevated. We Tarhonda Hollenberg plan to reload him on amiodarone. He may benefit from mexiletine as well if this does not control his arrhythmias. Unfortunately,  his heart failure appears to be somewhat end-stage, on dialysis with hypotension.  Reine Bristow M. Takeem Krotzer MD 10/30/2019 12:40 PM

## 2019-10-31 NOTE — ED Notes (Signed)
ST JUDE ICD interrogated

## 2019-11-01 DIAGNOSIS — I5042 Chronic combined systolic (congestive) and diastolic (congestive) heart failure: Secondary | ICD-10-CM

## 2019-11-01 DIAGNOSIS — Z515 Encounter for palliative care: Secondary | ICD-10-CM

## 2019-11-01 DIAGNOSIS — Z66 Do not resuscitate: Secondary | ICD-10-CM

## 2019-11-01 DIAGNOSIS — Z7189 Other specified counseling: Secondary | ICD-10-CM

## 2019-11-01 LAB — RENAL FUNCTION PANEL
Albumin: 2.5 g/dL — ABNORMAL LOW (ref 3.5–5.0)
Anion gap: 15 (ref 5–15)
BUN: 38 mg/dL — ABNORMAL HIGH (ref 8–23)
CO2: 24 mmol/L (ref 22–32)
Calcium: 8.3 mg/dL — ABNORMAL LOW (ref 8.9–10.3)
Chloride: 94 mmol/L — ABNORMAL LOW (ref 98–111)
Creatinine, Ser: 5.8 mg/dL — ABNORMAL HIGH (ref 0.61–1.24)
GFR calc Af Amer: 9 mL/min — ABNORMAL LOW (ref >60)
GFR calc non Af Amer: 8 mL/min — ABNORMAL LOW (ref >60)
Glucose, Bld: 158 mg/dL — ABNORMAL HIGH (ref 70–99)
Phosphorus: 4.6 mg/dL (ref 2.5–4.6)
Potassium: 4.5 mmol/L (ref 3.5–5.1)
Sodium: 133 mmol/L — ABNORMAL LOW (ref 135–145)

## 2019-11-01 LAB — CBC
HCT: 29.7 % — ABNORMAL LOW (ref 39.0–52.0)
Hemoglobin: 9.1 g/dL — ABNORMAL LOW (ref 13.0–17.0)
MCH: 31.2 pg (ref 26.0–34.0)
MCHC: 30.6 g/dL (ref 30.0–36.0)
MCV: 101.7 fL — ABNORMAL HIGH (ref 80.0–100.0)
Platelets: 74 10*3/uL — ABNORMAL LOW (ref 150–400)
RBC: 2.92 MIL/uL — ABNORMAL LOW (ref 4.22–5.81)
RDW: 24.9 % — ABNORMAL HIGH (ref 11.5–15.5)
WBC: 8 10*3/uL (ref 4.0–10.5)
nRBC: 0 % (ref 0.0–0.2)

## 2019-11-01 LAB — GLUCOSE, CAPILLARY: Glucose-Capillary: 240 mg/dL — ABNORMAL HIGH (ref 70–99)

## 2019-11-01 MED ORDER — GLYCOPYRROLATE 0.2 MG/ML IJ SOLN: 0.1000 mg | Freq: Once | INTRAMUSCULAR | Status: DC

## 2019-11-01 MED ORDER — ONDANSETRON 4 MG PO TBDP
4.0000 mg | ORAL_TABLET | Freq: Four times a day (QID) | ORAL | Status: DC | PRN
  Filled 2019-11-01: qty 1

## 2019-11-01 MED ORDER — GLYCOPYRROLATE 0.2 MG/ML IJ SOLN
0.4000 mg | INTRAMUSCULAR | Status: DC
  Administered 2019-11-01 – 2019-11-04 (×11): 0.4 mg via INTRAVENOUS
  Filled 2019-11-01 (×12): qty 2

## 2019-11-01 MED ORDER — LORAZEPAM 2 MG/ML IJ SOLN: 0.5000 mg | INTRAMUSCULAR | Status: DC | PRN

## 2019-11-01 MED ORDER — POLYVINYL ALCOHOL 1.4 % OP SOLN
1.0000 [drp] | Freq: Four times a day (QID) | OPHTHALMIC | Status: DC | PRN
  Filled 2019-11-01: qty 15

## 2019-11-01 MED ORDER — HYDROMORPHONE HCL 1 MG/ML IJ SOLN
0.5000 mg | INTRAMUSCULAR | Status: DC | PRN
  Administered 2019-11-02: 1 mg via INTRAVENOUS
  Administered 2019-11-02: 0.5 mg via INTRAVENOUS
  Administered 2019-11-02 – 2019-11-04 (×6): 1 mg via INTRAVENOUS
  Filled 2019-11-01 (×11): qty 1

## 2019-11-01 MED ORDER — HYDROMORPHONE HCL 1 MG/ML IJ SOLN
2.0000 mg | Freq: Once | INTRAMUSCULAR | Status: AC
  Administered 2019-11-01: 2 mg via INTRAVENOUS
  Filled 2019-11-01: qty 2

## 2019-11-01 MED ORDER — BIOTENE DRY MOUTH MT LIQD: 15.0000 mL | OROMUCOSAL | Status: DC | PRN

## 2019-11-01 MED ORDER — BISACODYL 10 MG RE SUPP: 10.0000 mg | Freq: Every day | RECTAL | Status: DC | PRN

## 2019-11-01 MED ORDER — ONDANSETRON HCL 4 MG/2ML IJ SOLN
4.0000 mg | Freq: Four times a day (QID) | INTRAMUSCULAR | Status: DC | PRN
  Administered 2019-11-02: 4 mg via INTRAVENOUS
  Filled 2019-11-01: qty 2

## 2019-11-01 MED ORDER — MORPHINE SULFATE (CONCENTRATE) 10 MG/0.5ML PO SOLN: 5.0000 mg | ORAL | Status: DC | PRN

## 2019-11-01 NOTE — Progress Notes (Signed)
PROGRESS NOTE  Ricardo Payne KKX:[id number] DOB: 14-Jun-1930 DOA: 11/03/2019 PCP: [name], FNP  Brief History   The patient is a 86 yr old man with ESRD on HD. He presented to [facility] ED with complaints of syncope that was associated with VT/Poly morphicVT and ICD shocks. The patient has been admitted to a telemetry bed. Nephrology was consulted as has been cardiology and electrophysiology. Due to the patient's ischemic CMO and arrhythmias with syncope, he is no longer a candidate for HD. The patient has chosen to be made a DNR with comfort care only in the future.  Consultants   Cardiology  Electrophysiology  Nephrology  Palliative Care  Procedures   None  Antibiotics   Anti-infectives (From admission, onward)   None      Subjective  The patient is resting comfortably. No new complaints.  Objective   Vitals:  Vitals:   11/01/19 0549 11/01/19 0721  BP: (!) 86/48 (!) 92/51  Pulse:  65  Resp: 18 20  Temp: 98 F (36.7 C) 97.8 F (36.6 C)  SpO2:  100%   Exam:  Constitutional:   The patient is awake, alert, and oriented x 3. No acute distress. Respiratory:   No increased work of breathing.  No wheezes, rales, or rhonchi  No tactile fremitus Cardiovascular:   Regular rate and rhythm  No murmurs, ectopy, or gallups.  No lateral PMI. No thrills. Abdomen:   Abdomen is soft, non-tender, non-distended  No hernias, masses, or organomegaly  Normoactive bowel sounds.  Musculoskeletal:   No cyanosis, clubbing, or edema Skin:   No rashes, lesions, ulcers  palpation of skin: no induration or nodules Neurologic:   CN 2-12 intact  Sensation all 4 extremities intact Psychiatric:   Mental status o Mood, affect appropriate o Orientation to person, place, time   judgment and insight appear intact  I have personally reviewed the following:   Today's Data   Vitals, BMP, Magnesium, CBC  Cardiology Data   EKG  Scheduled Meds:   glycopyrrolate  0.4 mg Intravenous Q4H   mouth rinse  15 mL Mouth Rinse BID   pramipexole  0.25 mg Oral BID   Principal Problem:   Syncope and collapse Active Problems:   ICD (implantable cardioverter-defibrillator) in place   Chronic combined systolic and diastolic CHF (congestive heart failure) (HCC)   Thrombocytopenia (HCC)   Anemia   Hypotension   ESRD on dialysis ([place])   DNR (do not resuscitate)   VT (ventricular tachycardia) ([place])   End of life care   Terminal care   LOS: 1 day   A & P  Syncope and collapse Polymorphic V. Tach AICD in place: Acute.  Patient presents after having syncopal episode.  Interrogation of pacemaker revealed polymorphic ventricular tachycardia patient receiving 3 shocks.  QT was noted to be prolonged at 497.  Patient was seen by Dr. Curt Bears of EP recommended to be loaded with amiodarone. Overall patient noted to be in end-stage heart failure.  Limited options at this time with poor overall prognosis given decreased ability for patient to tolerate hemodialysis for which nephrology recommended consulting hospice. The patient has chosen to be comfort care only. AICD will be turned off. Palliative care has been consulted.  Hypotension: Acute on chronic. Patient on midodrine 15 mg 3 times daily.  Initial blood pressures noted to be as low as 74/55 even after receiving 500 mL fluid bolus. The patient will be made comfort care only. Blood pressures will not be followed  or treated.     Paroxysmal atrial fibrillation on chronic anticoagulation: Patient had been okayed after EGD to restart Eliquis.  CHA2DS2-VASc score = 6. Eliquis will be stopped as the patient is now comfort care only.   Chronic combined systolic and diastolic CHF: Chest x-ray was otherwise noted to be stable.  Last EF noted to be less than 20% with global hypokinesis.  Patient with some lower extremity edema appreciated, but does not appear grossly fluid overloaded.  Cardiology on board and  following. The patient will now be comfort care only. Palliative care has been consulted.  Anemia/question GI bleed: Hemoglobin 9.4->8.6 on admission which appears improved from previous at discharge of 8.4 on 5/25.  Stool guaiacs however noted to still be positive. Comfort care only.  Esophageal candidiasis: Patient had been on fluconazole, but his medications need to be discontinued as it could have been putting patient at risk for going into VT. Comfort care only.  Thrombocytopenia: Chronic. Platelet count 78 which appears near patient baseline. Comfort care only.  ESRD on HD: Patient normally dialyzes Monday, Wednesday, Friday, and Saturday.  Labs significant for potassium 4.2, BUN 32, and creatinine 4.88.  Right chest catheter in place. The patient will be comfort care only. No further dialysis. Palliative care has been consulted.  DNR: Present on admission.  Due to limited options at this time overall patient   DVT prophylaxis: SCD Code Status: DNR Family Communication: None available. Family aware of decision of patient to become comfort care. Disposition Plan: To be determined  Jerine Surles, DO [facility] Direct contact: see [email or web address]  7PM-7AM contact night coverage as above 11/01/2019, 2:05 PM  LOS: 1 day

## 2019-11-01 NOTE — Progress Notes (Signed)
Engineer, maintenance [facility]) Hospital Liaison note.     Received request from [place], [place] for family interest in [facility].  [place] is unable to offer a room today. Hospital Liaison will follow up tomorrow or sooner if a room becomes available.     A Please do not hesitate to call with questions.     Thank you,    Farrel Gordon, RN, [facility]       [place] (listed on AMION under [place])     [phone]

## 2019-11-01 NOTE — Consult Note (Signed)
Palliative Medicine Inpatient Consult Note  Reason for consult:  Goals of Care, Comfort Measures  HPI:  Per intake H&P --> Ricardo Payne is a 86 y.o. male with medical history significant of HTN, HLD, CAD,  chronic combined systolic and diastolic CHF, s/p AICD, PAF on Eliquis, ESRD on HD, , and CVA who presents after having a syncopal event.  Son reportedly found him on the floor.  He denies having any chest pain or palpitation.  Patient had just recently been hospitalized for hypotension, cellulitis, GI bleed transfused 1 unit of blood.  EGD significant for gastric petechia with concern for esophageal candidiasis with plan to treat with 10 to 14 days of fluconazole.  Colonoscopy at that time had not been recommended due to advanced age.  Palliative care was asked to get involved to discuss goals of care.  Clinical Assessment/Goals of Care: I have reviewed medical records including EPIC notes, labs and imaging, received report from bedside RN, assessed the patient.    I met with Ricardo Payne and called Ricardo Payne on speakerphone  to further discuss diagnosis prognosis, GOC, EOL wishes, disposition and options.   I introduced Palliative Medicine as specialized medical care for people living with serious illness. It focuses on providing relief from the symptoms and stress of a serious illness. The goal is to improve quality of life for both the patient and the family.  Ricardo shares that Ricardo Payne moved to a condo in [place] from [place] in September 2020 (son had been trying to get him to move for awhile). Ricardo lives about 10 minutes from patient and checks in on him at least 2x a week. Ricardo has been trying to maintain his father's independence but shares his belief that his father waited too long before coming to the hospital.   Briefly discussed course of hospitalization including diagnoses, interventions, plan of care. Explained irreversible nature of his heart condition and  this now unfortunately leading to multi-organ failure. Reviewed recommendations from cardiology and nephrology. Discussed how dialysis is not considered beneficial at this point.   A detailed discussion was had today regarding advanced directives, Ricardo is patients surrogate decision maker.    Concepts specific to code status, artifical feeding and hydration, continued IV antibiotics and rehospitalization was had. The difference between a aggressive medical intervention path  and a palliative comfort care path for this patient at this time was had. At this point Ricardo Payne would like to be DNAR/DNI and to focus on comfort. We talked about stopping dialysis which would mean that Ricardo Payne has very limited time left which he shared understanding of.  We talked about transition to comfort measures in house and what that would entail inclusive of medications to control pain, dyspnea, agitation, nausea, itching, and hiccups.  We discussed stopping all uneccessary measures such as blood draws, needle sticks, and frequent vital signs. Utilized reflective listening throughout our time together.   Discussed transition to a residential hospice which Ricardo and Ricardo Payne are agreeable to.   Decision Maker: Patient can make decisions for himself  SUMMARY OF RECOMMENDATIONS   DNAR/DNI  Stop HD  Plan for transition to comfort measures  Deactivate AICD  TPC --> Transition to residential hospice  Symptom control as below  Code Status/Advance Care Planning: DNAR/DNI   Symptom Management:  Dyspnea: Pain:  - Dilaudid 0.5-45m IV Q1H PRN Fever:  - Tylenol 6549mPO/PR Q6H PRN Agitation: Anxiety:  - Lorazepam 0.5-31m83mV  Q1H PRN Nausea:  - Zofran 4mg26m/IV Q6H PRN  Secretions:  - Glycopyrrolate 0.58m IV Q4H ATC Dry Eyes:  - Artificial Tears PRN Xerostomia:  - Medline Mouth rinse BID  - BID oral care Urinary Retention:  - Bladder scan if > 2555mplace and maintain foley  Constipation:  -  Bisacodyl 1070mR PRN QDay Spiritual:  - Chaplain consult   Palliative Prophylaxis:   Pain, Turn Q2H, Delirium, Aspiration  Additional Recommendations (Limitations, Scope, Preferences):  Comfort measures    Psycho-social/Spiritual:   Desire for further Chaplaincy support: Yes  Additional Recommendations: Education on residential hospice   Prognosis: Days  Discharge Planning: Discharge to residential hospice.  PPS: 30%    This conversation/these recommendations were discussed with patient primary care team, Dr. SwaBenny Lennertime In: 0730 Time Out: 0845 Total Time: 75 58eater than 50%  of this time was spent counseling and coordinating care related to the above assessment and plan.  [place] Team Cell Phone: [phone] utilize secure chat with additional questions, if there is no response within 30 minutes please call the above phone number  Palliative Medicine Team providers are available by phone from 7am to 7pm daily and can be reached through the team cell phone.  Should this patient require assistance outside of these hours, please call the patient's attending physician.

## 2019-11-01 NOTE — Progress Notes (Signed)
Hospice and palliative care are working w/ patient. No further suggestions. Will sign off.   Kelly Splinter, MD 11/01/2019, 4:20 PM

## 2019-11-01 NOTE — Progress Notes (Signed)
   11/01/19 0951  Clinical Encounter Type  Visited With Patient  Visit Type Initial;Spiritual support  Referral From Palliative care team  Consult/Referral To Chaplain  This chaplain introduced herself to the Pt. RN-Tina and received an update from PMT NP-MYF.  The Pt. presented himself to the chaplain with a sense of peace. The chaplain understands the Pt. step son-Dwayne will visit today.  The Pt. shared Dwayne is closer to him than his own children.  The chaplain affirmed the Pt. relationship with Dwayne. The Pt. shared with the chaplain he did not sleep well last night and is looking forward to the pain medicine allowing him to rest.  The chaplain honored the Pt. statement and stepped away. The Pt. is open to a F/U spiritual care visit.

## 2019-11-01 NOTE — Progress Notes (Addendum)
Contacted [employer] to have the patients ICD deactivated. Aaron Edelman to call back.   Saddie Benders RN BSN  [place] to see patient shortly.

## 2019-11-01 NOTE — Progress Notes (Addendum)
Advanced Heart Failure Rounding Note  PCP-Cardiologist: Donato Heinz, MD  AHF: Dr. Aundra Dubin  Subjective:    86 y.o. with history of CAD s/p CABG, ischemic cardiomyopathy with RV failure, paroxysmal atrial fibrillation, and ESRD on HD, admitted for syncope in the setting of VT. ICD interrogation showed PMVT treated w/ ICD shocks x 3.   He has met w/ palliative care to discuss [place] given his end stage HF and has decided to end HD and move to full comfort care/ home hospice.   Currently resting comfortably. No resting dyspnea. No pain. Tele disconnected.   Objective:   Weight Range: 86 kg Body mass index is 30.6 kg/m.   Vital Signs:   Temp:  [97.8 F (36.6 C)-98.1 F (36.7 C)] 97.8 F (36.6 C) (06/03 0721) Pulse Rate:  [64-75] 65 (06/03 0721) Resp:  [14-28] 20 (06/03 0721) BP: (59-102)/(44-61) 92/51 (06/03 0721) SpO2:  [94 %-100 %] 100 % (06/03 0721) Weight:  [86 kg] 86 kg (06/03 0549) Last BM Date: 11/05/2019  Weight change: Filed Weights   11/05/2019 0709 11/01/19 0549  Weight: 86.2 kg 86 kg    Intake/Output:   Intake/Output Summary (Last 24 hours) at 11/01/2019 1145 Last data filed at 11/01/2019 0935 Gross per 24 hour  Intake 834.4 ml  Output --  Net 834.4 ml      Physical Exam    General:  fatigue appearing elderly WM. No resp difficulty HEENT: Normal Neck: Supple. JVP elevated to ear . Carotids 2+ bilat; no bruits. No lymphadenopathy or thyromegaly appreciated. Cor: PMI nondisplaced. Regular rate & rhythm. No rubs, gallops or murmurs. Lungs: Clear Abdomen: Soft, nontender, nondistended. No hepatosplenomegaly. No bruits or masses. Good bowel sounds. Extremities: No cyanosis, clubbing, rash, trace bilateral LEE w/ bilateral chronic venous stasis dermatitis  Neuro: Alert & orientedx3, cranial nerves grossly intact. moves all 4 extremities w/o difficulty. Affect pleasant   Telemetry   Tele disconnected (full comfort care)  EKG    6/2 EKG  Accelerated junctional rhythm Left bundle branch block 71 bpm   Labs    CBC Recent Labs    11/13/2019 0726 11/19/2019 0746 11/25/2019 1315 11/01/19 0316  WBC 7.6  --   --  8.0  HGB 9.4*   < > 8.6* 9.1*  HCT 29.8*   < > 28.4* 29.7*  MCV 104.2*  --   --  101.7*  PLT 78*  --   --  74*   < > = values in this interval not displayed.   Basic Metabolic Panel Recent Labs    11/13/2019 0726 11/23/2019 0726 11/20/2019 0746 11/13/2019 0943 11/01/19 0316  NA 135   < > 132*  --  133*  K 4.2   < > 3.8  --  4.5  CL 93*   < > 94*  --  94*  CO2 24  --   --   --  24  GLUCOSE 149*   < > 151*  --  158*  BUN 32*   < > 29*  --  38*  CREATININE 4.88*   < > 5.20*  --  5.80*  CALCIUM 8.5*  --   --   --  8.3*  MG  --   --   --  2.1  --   PHOS  --   --   --   --  4.6   < > = values in this interval not displayed.   Liver Function Tests Recent Labs  11/01/19 0316  ALBUMIN 2.5*   Recent Labs    11/13/2019 0726  LIPASE 36   Cardiac Enzymes No results for input(s): CKTOTAL, CKMB, CKMBINDEX, TROPONINI in the last 72 hours.  BNP: BNP (last 3 results) Recent Labs    08/13/19 1437 08/16/19 1659 08/17/19 0714  BNP 1,224.9* 1,081.1* 1,056.0*    ProBNP (last 3 results) Recent Labs    05/21/19 1535  PROBNP 6,191*     D-Dimer No results for input(s): DDIMER in the last 72 hours. Hemoglobin A1C No results for input(s): HGBA1C in the last 72 hours. Fasting Lipid Panel No results for input(s): CHOL, HDL, LDLCALC, TRIG, CHOLHDL, LDLDIRECT in the last 72 hours. Thyroid Function Tests No results for input(s): TSH, T4TOTAL, T3FREE, THYROIDAB in the last 72 hours.  Invalid input(s): FREET3  Other results:   Imaging     No results found.   Medications:     Scheduled Medications: . glycopyrrolate  0.4 mg Intravenous Q4H  . mouth rinse  15 mL Mouth Rinse BID  . pramipexole  0.25 mg Oral BID     Infusions:   PRN Medications:  acetaminophen **OR** acetaminophen, albuterol,  antiseptic oral rinse, bisacodyl, HYDROmorphone (DILAUDID) injection, LORazepam, ondansetron **OR** ondansetron (ZOFRAN) IV, polyvinyl alcohol    Assessment/Plan   1. Hemodynamically Unstable VT: Presented w/ Syncope. Polymorphic VT noted on ICD interrogation, treated w/ defibrillation x 3. In the setting of end stage HF. He has met w/ palliative care and has decided to move to full comfort care only/ home hospice.  - IV amiodarone has been discontinued and off tele - deactivate ICD  2. Stage D Heart Failure w/ Biventricular Dysfunction - End-stage HF. Not a candidate for advanced therapies w/ advanced aged and other comorbidities  - comfort care per above, transitioning to home hospice  3. ESRD - he has decided to stop HD  Length of Stay: 1  Brittainy Simmons, PA-C  11/01/2019, 11:45 AM  Advanced Heart Failure Team Pager [phone] (M-F; [place])  Please contact [facility] for night-coverage after hours (4p -7a ) and weekends on [email or web address]  Patient seen with PA, agree with the above note.   Admitted with VT/ICD shocks, unable to tolerate HD due to hypotension and VT.  Agree that we really have no more options for aggressive care, does not look like he will tolerate HD.   Agree with plan for home with hospice.   Loralie Champagne 11/01/2019 3:44 PM .

## 2019-11-01 NOTE — TOC Initial Note (Signed)
Transition of Care [facility]) - Initial/Assessment Note    Patient Details  Name: Ricardo Payne MRN: [id number] Date of Birth: [birth year]  Transition of [facility]) CM/SW Contact:    Trula Ore, [place] Phone Number: 11/01/2019, 2:59 PM  Clinical Narrative:                  CSW spoke with [facility] with [facility] who said she would reach out to family about residential hospice.  CSW will continue to follow for discharge plans.  Expected Discharge Plan: [place] Barriers to Discharge: Continued Medical Work up   Patient Goals and CMS Choice        Expected Discharge Plan and Services Expected Discharge Plan: [place]                                              Prior Living Arrangements/Services     Patient language and need for interpreter reviewed:: Yes Do you feel safe going back to the place where you live?: No   residential hospice  Need for Family Participation in Patient Care: Yes (Comment) Care giver support system in place?: Yes (comment)   Criminal Activity/Legal Involvement Pertinent to Current Situation/Hospitalization: No - Comment as needed  Activities of Daily Living Home Assistive Devices/Equipment: None ADL Screening (condition at time of admission) Patient's cognitive ability adequate to safely complete daily activities?: Yes Is the patient deaf or have difficulty hearing?: No Does the patient have difficulty seeing, even when wearing glasses/contacts?: No Does the patient have difficulty concentrating, remembering, or making decisions?: No Patient able to express need for assistance with ADLs?: Yes Does the patient have difficulty dressing or bathing?: No Independently performs ADLs?: Yes (appropriate for developmental age) Does the patient have difficulty walking or climbing stairs?: No Weakness of Legs: None Weakness of Arms/Hands: None  Permission Sought/Granted Permission sought to share  information with : Case Manager, Family Supports, Customer service manager                Emotional Assessment       Orientation: : Oriented to Self, Oriented to Place, Oriented to  Time, Oriented to Situation Alcohol / Substance Use: Not Applicable Psych Involvement: No (comment)  Admission diagnosis:  Polymorphic ventricular tachycardia (HCC) [I47.2] Hypotension [I95.9] Hypotension, unspecified hypotension type [I95.9] Syncope, unspecified syncope type [R55] Patient Active Problem List   Diagnosis Date Noted  . End of life care   . Terminal care   . DNR (do not resuscitate) 11/26/2019  . VT (ventricular tachycardia) ([place]) 11/10/2019  . Syncope and collapse 11/24/2019  . Hypotension 10/18/2019  . GI bleed 10/18/2019  . ESRD on dialysis ([place]) 10/18/2019  . Cellulitis of leg 10/18/2019  . Dyspnea   . Acute decompensated heart failure ([place]) 08/16/2019  . Heart failure with reduced ejection fraction ([place]) 07/19/2019  . Congestive heart failure ([place]) 07/18/2019  . Palliative care by specialist   . Goals of care, counseling/discussion   . Cirrhosis ([place])   . CKD (chronic kidney disease) 06/25/2019  . Head trauma 06/25/2019  . Blood thinned due to long-term anticoagulant use 06/25/2019  . Anemia 06/25/2019  . Acute renal failure superimposed on chronic kidney disease ([place]) 05/23/2019  . Chronic combined systolic and diastolic CHF (congestive heart failure) ([place]) 05/23/2019  . Esophageal hypertension 05/23/2019  . Thrombocytopenia ([place]) 05/23/2019  . Acute  on chronic HFrEF (heart failure with reduced ejection fraction) ([place]) 05/22/2019  . ICD (implantable cardioverter-defibrillator) in place 04/18/2019   PCP:  [name], FNP Pharmacy:   [id number] - [place], [place] [address] Phone: [phone] Fax: [place], [place] - [address] [place] [address] Phone: [phone] Fax: [phone]     Social Determinants of Health (SDOH) Interventions    Readmission Risk Interventions No flowsheet data found.

## 2019-11-01 NOTE — Progress Notes (Addendum)
Progress Note  Patient Name: Ricardo Payne Date of Encounter: 11/01/2019  Primary Cardiologist: Donato Heinz, MD   Subjective   His back hurts, otherwise doing "OK", no CP, intermittently feels a little SOB  Inpatient Medications    Scheduled Meds:  mouth rinse  15 mL Mouth Rinse BID   midodrine  15 mg Oral TID PC   multivitamin  1 tablet Oral QHS   pramipexole  0.25 mg Oral BID   rosuvastatin  10 mg Oral Daily   sevelamer carbonate  800 mg Oral TID WC   Continuous Infusions:  amiodarone 30 mg/hr (11/01/19 0137)   PRN Meds: acetaminophen **OR** acetaminophen, albuterol   Vital Signs    Vitals:   11/01/19 0022 11/01/19 0023 11/01/19 0549 11/01/19 0721  BP: (!) 86/48 92/61 (!) 86/48 (!) 92/51  Pulse:  75  65  Resp:   18 20  Temp:   98 F (36.7 C) 97.8 F (36.6 C)  TempSrc:   Oral Oral  SpO2:  99%  100%  Weight:   86 kg   Height:        Intake/Output Summary (Last 24 hours) at 11/01/2019 0814 Last data filed at 11/08/2019 1303 Gross per 24 hour  Intake 1000 ml  Output --  Net 1000 ml   Last 3 Weights 11/01/2019 11/13/2019 10/23/2019  Weight (lbs) 189 lb 9.5 oz 190 lb 201 lb 14.4 oz  Weight (kg) 86 kg 86.183 kg 91.581 kg      Telemetry    SR - Personally Reviewed  ECG    No new EKGs - Personally Reviewed  Physical Exam   GEN: No acute distress.   Neck: No JVD Cardiac: RRR, no murmurs, rubs, or gallops.  Respiratory: decreased at the bases. GI: Soft, nontender, non-distended  MS: 2+ edema, chronic skin changes. Neuro:  Nonfocal  Psych: Normal affect   Labs    High Sensitivity Troponin:   Recent Labs  Lab 11/23/2019 0726 11/19/2019 0943  TROPONINIHS <2 125*      Chemistry Recent Labs  Lab 11/15/2019 0726 10/30/2019 0746 11/01/19 0316  NA 135 132* 133*  K 4.2 3.8 4.5  CL 93* 94* 94*  CO2 24  --  24  GLUCOSE 149* 151* 158*  BUN 32* 29* 38*  CREATININE 4.88* 5.20* 5.80*  CALCIUM 8.5*  --  8.3*  ALBUMIN  --   --  2.5*  GFRNONAA  10*  --  8*  GFRAA 11*  --  9*  ANIONGAP 18*  --  15     Hematology Recent Labs  Lab 11/16/2019 0726 11/14/2019 0726 11/28/2019 0746 10/30/2019 1315 11/01/19 0316  WBC 7.6  --   --   --  8.0  RBC 2.86*  --   --   --  2.92*  HGB 9.4*   < > 9.2* 8.6* 9.1*  HCT 29.8*   < > 27.0* 28.4* 29.7*  MCV 104.2*  --   --   --  101.7*  MCH 32.9  --   --   --  31.2  MCHC 31.5  --   --   --  30.6  RDW 25.1*  --   --   --  24.9*  PLT 78*  --   --   --  74*   < > = values in this interval not displayed.    BNPNo results for input(s): BNP, PROBNP in the last 168 hours.   DDimer No results for input(s): DDIMER  in the last 168 hours.   Radiology    CT HEAD WO CONTRAST  Result Date: 11/10/2019 CLINICAL DATA:  Syncope, head trauma EXAM: CT HEAD WITHOUT CONTRAST TECHNIQUE: Contiguous axial images were obtained from the base of the skull through the vertex without intravenous contrast. COMPARISON:  06/27/2019 FINDINGS: Brain: No evidence of acute infarction, hemorrhage, extra-axial collection, ventriculomegaly, or mass effect. Bilateral subdural hygromas. Generalized cerebral atrophy. Periventricular white matter low attenuation likely secondary to microangiopathy. Vascular: Cerebrovascular atherosclerotic calcifications are noted. Skull: Negative for fracture or focal lesion. Sinuses/Orbits: Visualized portions of the orbits are unremarkable. Visualized portions of the paranasal sinuses are unremarkable. Visualized portions of the mastoid air cells are unremarkable. Other: None. IMPRESSION: 1. No acute intracranial pathology. 2. Chronic microvascular ischemic changes and cerebral atrophy. Electronically Signed   By: Kathreen Devoid   On: 11/10/2019 08:26   DG Chest Port 1 View  Result Date: 11/03/2019 CLINICAL DATA:  Fall on blood thinners with rib pain on the left EXAM: PORTABLE CHEST 1 VIEW COMPARISON:  10/28/2019 FINDINGS: Cardiomegaly. Single chamber ICD into the right ventricle. Dialysis catheter on the right  with tip at the upper cavoatrial junction. Prior median sternotomy. Mild pulmonary scarring. There is no edema, consolidation, effusion, or pneumothorax. IMPRESSION: Stable compared to prior.  No evidence of acute disease. Electronically Signed   By: Monte Fantasia M.D.   On: 11/10/2019 07:45    Cardiac Studies     10/18/2019: LE venous US BILATERAL:  - No evidence of deep vein thrombosis seen in the lower extremities,  bilaterally.      08/22/2019: TEE IMPRESSIONS  1. Left ventricular ejection fraction, by estimation, is <20%. The left  ventricle has severely decreased function. The left ventricle demonstrates  global hypokinesis. The left ventricular internal cavity size was mildly  dilated.   2. Right ventricular systolic function is moderately reduced. The right  ventricular size is moderately enlarged. There is moderately elevated  pulmonary artery systolic pressure.   3. No ASD or PFO by color doppler. Left atrial size was moderately  dilated. No left atrial/left atrial appendage thrombus was detected.   4. Right atrial size was moderately dilated.   5. Peak RV-RA gradient 36 mmHg. Tricuspid valve regurgitation is severe.   6. Moderately calcified aortic valve with trivial regurgitation. There  was no significant aortic stenosis, mean gradient 8 mmHg.   7. Moderate mitral regurgitation with restricted posterior mitral  leaflet. . The mitral valve is abnormal. Moderate mitral valve  regurgitation.   8. Normal caliber thoracic aorta with grade 3 plaque.      07/09/2019: TTE IMPRESSIONS  1. Left ventricular ejection fraction, by estimation, is <20%. The left  ventricle has severely decreased function. The left ventrical demonstrates  global hypokinesis. The left ventricular internal cavity size was mildly  dilated. Left ventricular  diastolic parameters are indeterminate.   2. Right ventricular systolic function is moderately reduced. The right  ventricular size is mildly  enlarged. There is moderately elevated  pulmonary artery systolic pressure. The estimated right ventricular  systolic pressure is 66.0 mmHg.   3. Mild to moderate mitral valve regurgitation.   4. Tricuspid valve regurgitation is severe.   5. The aortic valve is tricuspid. Mild aortic valve stenosis, mean  gradient only 6 mmHg but visually at least mild AS. No aortic  insufficiency.   6. Aortic dilatation noted. There is mild dilatation of the aortic root  measuring 41 mm.   7. Dilated IVC suggesting RA pressure  15 mmHg.   8. Rhythm difficult to discern, may be atrial fibrillation.   Patient Profile     86 y.o. male with a hx of ESRF on HD, HTN (with recent relative hypotension on midodrine), HLD, CAD (CABG 1990), ? H/o PVD, CVA, chronic CHF (systolic/diastolic), ITP, PAFib,  ICM w/ICD, gout admitted after syncope associated with VT  Device information SJM single chamber ICD, implanted 03/17/2018 [place]lin transmission from today Battery and lead measurements are good Today 05:45 VF episode Unable to see onset PMVT >>> ATP during charge unsuccessful > shock >> VF >> shock >> MMVT >>shock >> paced rhythm NOTE: that the device used morphology discriminator labeled SVT within the episode He also had 2 NSVT episodes were PMVT, both 6 seconds   On 10/24/19 at 04:49AM, he had VT epsiode MMVT > ATP >> PMVT >>> ATP failed >> VF >> spontaneous break to VP rhythm, charge dumped and shock aborted  Assessment & Plan       1. VT with ICD shocks     lytes are OK     Unable to see onset of events   QT does not look too long, though some of his VT was PMVT, please avoid antibiotics/antifungals that can contribute to QT prolongation given his amiodarone   Keep Mag >2.0 and K+ >4.0      2. PAFib     CHA2DS2Vasc is at least 6, on Eliquis     Is in SR     Eliquis held by IM on admission w/recent GIB and heme + stool  3. Endstage HF     Hypotension despite midodrine     amio gtt has made  little affect/lowering on his BP in comparison to ER BP prior to       Nephrology note reviewed.  No longer felt able to proceed with HD Discussed with the patient today, he looks forward to palliative meeting today and sounds like he will likely transition to comfort care. He says for now to keep his ICD on until he knows what that process will be  D/w Dr. Lovena Le Will keep amio gtt for now, and ICD HV therapies on until final decision/orders made.   For questions or updates, please contact [place] Please consult [email or web address] for contact info under     Signed, Baldwin [place], PA-C  11/01/2019, 8:14 AM    EP Attending  Patient seen and examined. He is well known to me. He has an endstage CM and the decision has been made not to proceed with HD for his ESRD. I think turning his device therapies off is reasonable. He has tolerated amiodarone so it is reasonable to continue this medication. He will be discharged to hospice/palliative care. His ICD therapies have been deactivated.   Mikle Bosworth.D.

## 2019-11-02 DIAGNOSIS — R55 Syncope and collapse: Secondary | ICD-10-CM

## 2019-11-02 MED ORDER — PHENOL 1.4 % MT LIQD
1.0000 | OROMUCOSAL | Status: DC | PRN
  Administered 2019-11-02: 1 via OROMUCOSAL
  Filled 2019-11-02: qty 177

## 2019-11-02 NOTE — Progress Notes (Signed)
PROGRESS NOTE  Ricardo Payne DXI:[id number] DOB: 07/07/1930 DOA: 10/30/2019 PCP: [name], FNP  Brief History   The patient is a 86 yr old man with ESRD on HD. He presented to [facility] ED with complaints of syncope that was associated with VT/Poly morphicVT and ICD shocks. The patient has been admitted to a telemetry bed. Nephrology was consulted as has been cardiology and electrophysiology. Due to the patient's ischemic CMO and arrhythmias with syncope, he is no longer a candidate for HD. The patient has chosen to be made a DNR with comfort care only.  Consultants  . Cardiology . Electrophysiology . Nephrology . Palliative Care  Procedures  . None  Antibiotics   Anti-infectives (From admission, onward)   None     Subjective  The patient is resting comfortably. He is complaining of throat pain.  Objective   Vitals:  Vitals:   11/02/19 0030 11/02/19 1342  BP: 90/66 (!) 80/42  Pulse: 62 84  Resp: 18 18  Temp: 98 F (36.7 C) 98.4 F (36.9 C)  SpO2: 99% 99%   Exam:  Constitutional:  . The patient is awake, alert, and oriented x 3. No acute distress. Respiratory:  . No increased work of breathing. . No wheezes, rales, or rhonchi . No tactile fremitus Cardiovascular:  . Regular rate and rhythm . No murmurs, ectopy, or gallups. . No lateral PMI. No thrills. Abdomen:  . Abdomen is soft, non-tender, non-distended . No hernias, masses, or organomegaly . Normoactive bowel sounds.  Musculoskeletal:  . No cyanosis, clubbing, or edema Skin:  . No rashes, lesions, ulcers . palpation of skin: no induration or nodules Neurologic:  . CN 2-12 intact . Sensation all 4 extremities intact Psychiatric:  . Mental status o Mood, affect appropriate o Orientation to person, place, time  . judgment and insight appear intact  I have personally reviewed the following:   Today's Data  . Vitals  Cardiology Data  . EKG  Scheduled Meds: . glycopyrrolate  0.4 mg  Intravenous Q4H  . mouth rinse  15 mL Mouth Rinse BID  . pramipexole  0.25 mg Oral BID   Principal Problem:   Syncope and collapse Active Problems:   ICD (implantable cardioverter-defibrillator) in place   Chronic combined systolic and diastolic CHF (congestive heart failure) (HCC)   Thrombocytopenia (HCC)   Anemia   Hypotension   ESRD on dialysis ([place])   DNR (do not resuscitate)   VT (ventricular tachycardia) ([place])   End of life care   Terminal care   LOS: 2 days   A & P  Syncope and collapse Polymorphic V. Tach AICD in place: Acute.  Patient presents after having syncopal episode.  Interrogation of pacemaker revealed polymorphic ventricular tachycardia patient receiving 3 shocks.  QT was noted to be prolonged at 497.  Patient was seen by Dr. Curt Bears of EP recommended to be loaded with amiodarone. Overall patient noted to be in end-stage heart failure.  Limited options at this time with poor overall prognosis given decreased ability for patient to tolerate hemodialysis for which nephrology recommended consulting hospice. The patient has chosen to be comfort care only. AICD will be turned off. Palliative care has been consulted.  Hypotension: Acute on chronic. Patient on midodrine 15 mg 3 times daily.  Initial blood pressures noted to be as low as 74/55 even after receiving 500 mL fluid bolus. The patient will be made comfort care only. Blood pressures will not be followed or treated.  Paroxysmal atrial fibrillation on chronic anticoagulation: Patient had been okayed after EGD to restart Eliquis.  CHA2DS2-VASc score = 6. Eliquis will be stopped as the patient is now comfort care only.   Chronic combined systolic and diastolic CHF: Chest x-ray was otherwise noted to be stable.  Last EF noted to be less than 20% with global hypokinesis.  Patient with some lower extremity edema appreciated, but does not appear grossly fluid overloaded.  Cardiology on board and following. The patient  will now be comfort care only. Palliative care has been consulted.  Anemia/question GI bleed: Hemoglobin 9.4->8.6 on admission which appears improved from previous at discharge of 8.4 on 5/25.  Stool guaiacs however noted to still be positive. Comfort care only.  Esophageal candidiasis: Patient had been on fluconazole, but his medications need to be discontinued as it could have been putting patient at risk for going into VT. Comfort care only.  Throat pain: Chlorasceptic spray  Thrombocytopenia: Chronic. Platelet count 78 which appears near patient baseline. Comfort care only.  ESRD on HD: Patient normally dialyzes Monday, Wednesday, Friday, and Saturday.  Labs significant for potassium 4.2, BUN 32, and creatinine 4.88.  Right chest catheter in place. The patient will be comfort care only. No further dialysis. Palliative care has been consulted.  I have seen and examined this patient myself. I have spent 28 minutes in his evaluation and care.  DVT prophylaxis: SCD Code Status: DNR Family Communication: None available. Family aware of decision of patient to become comfort care. Disposition Plan: To be determined  Ricardo Harrison, DO [facility] Direct contact: see [email or web address]  7PM-7AM contact night coverage as above 11/02/2019, 3:46 PM  LOS: 1 day

## 2019-11-02 NOTE — Progress Notes (Signed)
Palliative Medicine Inpatient Follow Up Note   Reason for consult:  Goals of Care, Comfort Measures  HPI:  Per intake H&P --> Ricardo Payne a 86 y.o.malewith medical history significant ofHTN, HLD, CAD, chronic combined systolic and diastolic CHF, s/p AICD,PAF on Eliquis, ESRD on HD, , and CVA who presents after having a syncopal event. Son reportedly found him on the floor. He denies having any chest pain or palpitation. Patient had just recently been hospitalized for hypotension, cellulitis, GI bleed transfused 1 unit of blood. EGD significant for gastric petechia with concern for esophageal candidiasis with plan to treat with 10 to 14 days of fluconazole. Colonoscopy at that time had not been recommended due to advanced age.  Palliative care was asked to get involved to discuss goals of care.   Today's Discussion (11/02/2019): Chart reviewed. I was able to meet with the patient who was in no distress upon this mornings assessment. The plan is for discharge to [facility] once a bed becomes available.  Discussed the importance of continued conversation with family and their  medical providers regarding overall plan of care and treatment options, ensuring decisions are within the context of the patients values and GOCs.  Questions and concerns addressed   Vital Signs Vitals:   11/01/19 2003 11/02/19 0030  BP: 92/66 90/66  Pulse: 66 62  Resp: 20 18  Temp: 97.8 F (36.6 C) 98 F (36.7 C)  SpO2: 98% 99%    Intake/Output Summary (Last 24 hours) at 11/02/2019 6283 Last data filed at 11/01/2019 1300 Gross per 24 hour  Intake 574.4 ml  Output --  Net 574.4 ml   Last Weight  Most recent update: 11/01/2019  5:56 AM   Weight  86 kg (189 lb 9.5 oz)           SUMMARY OF RECOMMENDATIONS DNAR/DNI  Stop HD  Remains on comfort measures  Deactivate AICD  Transition to residential hospice [facility], awaiting a bed)  Symptom control as below  Code  Status/Advance Care Planning: DNAR/DNI  Symptom Management: Dyspnea: Pain:                 - Dilaudid 0.5-1mg  IV Q1H PRN Fever:                 - Tylenol 650mg  PO/PR Q6H PRN Agitation: Anxiety:                 - Lorazepam 0.5-1mg  IV  Q1H PRN Nausea:                 - Zofran 4mg  PO/IV Q6H PRN             Secretions:                 - Glycopyrrolate 0.4mg  IV Q4H ATC Dry Eyes:                 - Artificial Tears PRN Xerostomia:                 - Medline Mouth rinse BID                 - BID oral care Urinary Retention:                 - Bladder scan if > 2108ml place and maintain foley  Constipation:                 - Bisacodyl 10mg  PR PRN QDay  Spiritual:                 - Chaplain consult  Time Spent: 15 Greater than 50% of the time was spent in counseling and coordination of care ______________________________________________________________________________________ [place] Team Team Cell Phone: [phone] Please utilize secure chat with additional questions, if there is no response within 30 minutes please call the above phone number  Palliative Medicine Team providers are available by phone from 7am to 7pm daily and can be reached through the team cell phone.  Should this patient require assistance outside of these hours, please call the patient's attending physician.

## 2019-11-02 NOTE — Progress Notes (Signed)
Engineer, maintenance [facility]) Hospital Liaison note.  [place] is unable to offer a room today. Hospital Liaison will follow up tomorrow or sooner if a room becomes available.     A Please do not hesitate to call with questions.     Thank you,    Farrel Gordon, RN, [facility]       [place] (listed on AMION under [place])     [phone]

## 2019-11-03 MED ORDER — OXYMETAZOLINE HCL 0.05 % NA SOLN
1.0000 | Freq: Two times a day (BID) | NASAL | Status: DC
  Administered 2019-11-03 (×2): 1 via NASAL
  Filled 2019-11-03: qty 30

## 2019-11-03 NOTE — TOC Progression Note (Signed)
Transition of Care [facility]) - Progression Note    Patient Details  Name: Ricardo Payne MRN: [id number] Date of Birth: 1931/01/14  Transition of Care [facility] CM/SW [place], [place] Phone Number: [phone] 11/03/2019, 12:35 PM  Clinical Narrative:     CSW reached out to hospital liaison [facility]) to ascertain if a bed was available . Awaiting a return call.  TOC team will continue to monitor for discharge planning needs.   Expected Discharge Plan: [place] Barriers to Discharge: Continued Medical Work up  Expected Discharge Plan and Services Expected Discharge Plan: [place]                                               Social Determinants of Health (SDOH) Interventions    Readmission Risk Interventions No flowsheet data found.

## 2019-11-03 NOTE — Progress Notes (Signed)
Palliative Medicine Inpatient Follow Up Note   Reason for consult:  Goals of Care, Comfort Measures  HPI:  Per intake H&P --> Ricardo Payne a 86 y.o.malewith medical history significant ofHTN, HLD, CAD, chronic combined systolic and diastolic CHF, s/p AICD,PAF on Eliquis, ESRD on HD, , and CVA who presents after having a syncopal event. Son reportedly found him on the floor. He denies having any chest pain or palpitation. Patient had just recently been hospitalized for hypotension, cellulitis, GI bleed transfused 1 unit of blood. EGD significant for gastric petechia with concern for esophageal candidiasis with plan to treat with 10 to 14 days of fluconazole. Colonoscopy at that time had not been recommended due to advanced age.  Palliative care was asked to get involved to discuss goals of care.  Today's Discussion (11/03/2019): Chart reviewed. Patient awake and alert this morning. He states that he is "ready to go" to [place] place. I informed him that this can sometimes he a process of awaiting a bed. He seemed to understand this.   Discussed the importance of continued conversation with family and their  medical providers regarding overall plan of care and treatment options, ensuring decisions are within the context of the patients values and GOCs.  Questions and concerns addressed   Vital Signs Vitals:   11/02/19 1342 11/02/19 2231  BP: (!) 80/42 (!) 82/61  Pulse: 84 (!) 108  Resp: 18 18  Temp: 98.4 F (36.9 C) 98.4 F (36.9 C)  SpO2: 99% 90%    Intake/Output Summary (Last 24 hours) at 11/03/2019 1228 Last data filed at 11/02/2019 1300 Gross per 24 hour  Intake 240 ml  Output --  Net 240 ml   Last Weight  Most recent update: 11/01/2019  5:56 AM   Weight  86 kg (189 lb 9.5 oz)           SUMMARY OF RECOMMENDATIONS DNAR/DNI  Remains on comfort measures  Transition to residential hospice [facility], awaiting a bed)  Symptom control as  below  Code Status/Advance Care Planning: DNAR/DNI  Symptom Management: Dyspnea: Pain:                 - Dilaudid 0.5-1mg  IV Q1H PRN Fever:                 - Tylenol 650mg  PO/PR Q6H PRN Agitation: Anxiety:                 - Lorazepam 0.5-1mg  IV  Q1H PRN Nausea:                 - Zofran 4mg  PO/IV Q6H PRN             Secretions:                 - Glycopyrrolate 0.4mg  IV Q4H ATC Dry Eyes:                 - Artificial Tears PRN Xerostomia:                 - Medline Mouth rinse BID                 - BID oral care Urinary Retention:                 - Bladder scan if > 263ml place and maintain foley  Constipation:                 - Bisacodyl 10mg   PR PRN QDay Spiritual:                 - Chaplain consult  AICD deactivated HD stopped  Time Spent: 15 Greater than 50% of the time was spent in counseling and coordination of care ______________________________________________________________________________________ [place] Team Team Cell Phone: [phone] Please utilize secure chat with additional questions, if there is no response within 30 minutes please call the above phone number  Palliative Medicine Team providers are available by phone from 7am to 7pm daily and can be reached through the team cell phone.  Should this patient require assistance outside of these hours, please call the patient's attending physician.

## 2019-11-03 NOTE — Progress Notes (Signed)
PROGRESS NOTE  Ricardo Payne VPX:[id number] DOB: December 29, 1930 DOA: 10/30/2019 PCP: [name], FNP  Brief History   The patient is a 86 yr old man with ESRD on HD. He presented to [facility] ED with complaints of syncope that was associated with VT/Poly morphicVT and ICD shocks. The patient has been admitted to a telemetry bed. Nephrology was consulted as has been cardiology and electrophysiology. Due to the patient's ischemic CMO and arrhythmias with syncope, he is no longer a candidate for HD. The patient has chosen to be made a DNR with comfort care only.  Consultants  . Cardiology . Electrophysiology . Nephrology . Palliative Care  Procedures  . None  Antibiotics   Anti-infectives (From admission, onward)   None     Subjective  The patient is resting comfortably. He is complaining of throat pain.  Objective   Vitals:  Vitals:   11/02/19 2231 11/03/19 1230  BP: (!) 82/61 (!) 93/55  Pulse: (!) 108 60  Resp: 18 16  Temp: 98.4 F (36.9 C) (!) 97.5 F (36.4 C)  SpO2: 90% 93%   Exam:  Constitutional:  . The patient is awake, alert, and oriented x 3. No acute distress. Respiratory:  . No increased work of breathing. . No wheezes, rales, or rhonchi . No tactile fremitus Cardiovascular:  . Regular rate and rhythm . No murmurs, ectopy, or gallups. . No lateral PMI. No thrills. Abdomen:  . Abdomen is soft, non-tender, non-distended . No hernias, masses, or organomegaly . Normoactive bowel sounds.  Musculoskeletal:  . No cyanosis, clubbing, or edema Skin:  . No rashes, lesions, ulcers . palpation of skin: no induration or nodules Neurologic:  . CN 2-12 intact . Sensation all 4 extremities intact Psychiatric:  . Mental status o Mood, affect appropriate o Orientation to person, place, time  . judgment and insight appear intact  I have personally reviewed the following:   Today's Data  . Vitals  Cardiology Data  . EKG  Scheduled Meds: .  glycopyrrolate  0.4 mg Intravenous Q4H  . mouth rinse  15 mL Mouth Rinse BID  . oxymetazoline  1 spray Each Nare BID  . pramipexole  0.25 mg Oral BID   Principal Problem:   Syncope and collapse Active Problems:   ICD (implantable cardioverter-defibrillator) in place   Chronic combined systolic and diastolic CHF (congestive heart failure) (HCC)   Thrombocytopenia (HCC)   Anemia   Hypotension   ESRD on dialysis ([place])   DNR (do not resuscitate)   VT (ventricular tachycardia) ([place])   End of life care   Terminal care   LOS: 3 days   A & P  Syncope and collapse Polymorphic V. Tach AICD in place: Acute.  Patient presents after having syncopal episode.  Interrogation of pacemaker revealed polymorphic ventricular tachycardia patient receiving 3 shocks.  QT was noted to be prolonged at 497.  Patient was seen by Dr. Curt Bears of EP recommended to be loaded with amiodarone. Overall patient noted to be in end-stage heart failure.  Limited options at this time with poor overall prognosis given decreased ability for patient to tolerate hemodialysis for which nephrology recommended consulting hospice. The patient has chosen to be comfort care only. AICD will be turned off. Palliative care has been consulted.  Hypotension: Acute on chronic. Patient on midodrine 15 mg 3 times daily.  Initial blood pressures noted to be as low as 74/55 even after receiving 500 mL fluid bolus. The patient will be made comfort care  only. Blood pressures will not be followed or treated.     Paroxysmal atrial fibrillation on chronic anticoagulation: Patient had been okayed after EGD to restart Eliquis.  CHA2DS2-VASc score = 6. Eliquis will be stopped as the patient is now comfort care only.   Chronic combined systolic and diastolic CHF: Chest x-ray was otherwise noted to be stable.  Last EF noted to be less than 20% with global hypokinesis.  Patient with some lower extremity edema appreciated, but does not appear grossly  fluid overloaded.  Cardiology on board and following. The patient will now be comfort care only. Palliative care has been consulted.  Anemia/question GI bleed: Hemoglobin 9.4->8.6 on admission which appears improved from previous at discharge of 8.4 on 5/25.  Stool guaiacs however noted to still be positive. Comfort care only.  Esophageal candidiasis: Patient had been on fluconazole, but his medications need to be discontinued as it could have been putting patient at risk for going into VT. Comfort care only.  Throat pain: Chlorasceptic spray  Thrombocytopenia: Chronic. Platelet count 78 which appears near patient baseline. Comfort care only.  ESRD on HD: Patient normally dialyzes Monday, Wednesday, Friday, and Saturday.  Labs significant for potassium 4.2, BUN 32, and creatinine 4.88.  Right chest catheter in place. The patient will be comfort care only. No further dialysis. Palliative care has been consulted.  I have seen and examined this patient myself. I have spent 25 minutes in his evaluation and care.  DVT prophylaxis: SCD Code Status: DNR Family Communication: None available. Family aware of decision of patient to become comfort care. Disposition Plan: To be determined  Ricardo Vanblarcom, DO [facility] Direct contact: see [email or web address]  7PM-7AM contact night coverage as above 11/03/2019, 4:30 PM  LOS: 1 day

## 2019-11-03 NOTE — Progress Notes (Signed)
Engineer, maintenance [facility]) Hospital Liaison note.  [place] is unable to offer a room today. Hospital Liaison will follow up tomorrow or sooner if a room becomes available.   A Please do not hesitate to call with questions.   Thank you,  Farrel Gordon, RN, [facility]  [place] (listed on AMION under [place])  [phone]

## 2019-11-03 NOTE — Plan of Care (Signed)
  Problem: Respiratory: Goal: Verbalizations of increased ease of respirations will increase Outcome: Progressing   Problem: Role Relationship: Goal: Family's ability to cope with current situation will improve Outcome: Progressing Goal: Ability to verbalize concerns, feelings, and thoughts to partner or family member will improve Outcome: Progressing   Problem: Pain Management: Goal: Satisfaction with pain management regimen will improve Outcome: Progressing   

## 2019-11-04 MED ORDER — GUAIFENESIN 100 MG/5ML PO SOLN: 5.0000 mL | ORAL | Status: DC | PRN

## 2019-11-05 LAB — CULTURE, BLOOD (SINGLE): Culture: NO GROWTH

## 2019-11-06 ENCOUNTER — Encounter (HOSPITAL_COMMUNITY): Payer: Medicare Other

## 2019-11-29 NOTE — Progress Notes (Signed)
Up to [facility] to try to urinate. Near syncopal episode. Manual lift back to bed. BP checked. Pulse faint. Monitor placed in comfort care mode. Dr Benny Lennert notified. Pt status Comfort care. Pt's son, Lavell Anchors, notified of change.

## 2019-11-29 NOTE — Death Summary Note (Signed)
Physician Discharge Summary  Ricardo Payne:[id number] DOB: [birth year] DOA: November 04, 2019  PCP: [name], FNP  Admit date: 11/27/2019 Date of death: 11-08-19 Time of death: 19:13  Discharge Diagnoses: Principal diagnosis is #1 1. Syncope and collapse 2. Hypotension 3. Paroxysmal Atrial Fibrillation 4. Polymorphic Ventricular tachycardia 5. ESRD on HD 6. Chronic combined systolic diastolic CHF 7. Anemia 8. Esophageal candidiasis 9. Throat pain 10. Thrombocytopenia   Filed Weights   November 04, 2019 0709 11/01/19 0549 Nov 08, 2019 1952  Weight: 86.2 kg 86 kg 86 kg    History of present illness:  Ricardo Payne is a 86 y.o. male with medical history significant of HTN, HLD, CAD,  chronic combined systolic and diastolic CHF, s/p AICD, PAF on Eliquis, ESRD on HD, , and CVA who presents after having a syncopal event.  Son reportedly found him on the floor.  He denies having any chest pain or palpitation.  Patient had just recently been hospitalized for hypotension, cellulitis, GI bleed transfused 1 unit of blood.  EGD significant for gastric petechia with concern for esophageal candidiasis with plan to treat with 10 to 14 days of fluconazole.  Colonoscopy at that time had not been recommended due to advanced age.  ED Course: Upon admission into the emergency department patient was noted to be hypotensive down to 75/45 patient had received 500 mL of normal saline IV fluids with temporary improvement.  Labs significant for hemoglobin 9.4(hemoglobin 8.2 at discharge on 5/25), potassium 4.2, BUN 32, and creatinine 4.88.  Stool guaiacs were noted to be positive.  Patient's pacemaker was interrogated and noted to have episodes of polymorphic ventricular tachycardia for which patient was shocked x3.  EP cardiology was consulted plan on trying to load the patient with amiodarone.  TRH called to admit.    Hospital Course:  The patient is a 86 yr old man with ESRD on HD. He presented to [facility] ED  with complaints of syncope that was associated with VT/Poly morphicVT and ICD shocks. The patient has been admitted to a telemetry bed. Nephrology was consulted as has been cardiology and electrophysiology. Due to the patient's ischemic CMO and arrhythmias with syncope, he is no longer a candidate for HD. The patient has chosen to be made a DNR with comfort care only.  Today's assessment: Please see progress note dated Nov 08, 2019 for physical exam on the date of death.  O: Vitals:  Vitals:   11-06-2019 2231 11/03/19 1230  BP: (!) 82/61 (!) 93/55  Pulse: (!) 108 60  Resp: 18 16  Temp: 98.4 F (36.9 C) (!) 97.5 F (36.4 C)  SpO2: 90% 93%     Allergies as of 08-Nov-2019   No Known Allergies     Medication List    ASK your doctor about these medications   acetaminophen 500 MG tablet Commonly known as: TYLENOL Take 1,000 mg by mouth every 6 (six) hours as needed for mild pain.   allopurinol 100 MG tablet Commonly known as: ZYLOPRIM Take 0.5 tablets (50 mg total) by mouth every other day.   amiodarone 200 MG tablet Commonly known as: PACERONE Take 1 tablet (200 mg total) by mouth daily.   apixaban 2.5 MG Tabs tablet Commonly known as: ELIQUIS Take 1 tablet (2.5 mg total) by mouth 2 (two) times daily.   cephALEXin 500 MG capsule Commonly known as: KEFLEX Take 500 mg by mouth 4 (four) times daily. Completed regimen on 10-27-19   docusate sodium 50 MG capsule Commonly known as: COLACE Take 50  mg by mouth daily.   fluconazole 100 MG tablet Commonly known as: DIFLUCAN Take 1 tablet (100 mg total) by mouth as directed. Take 100mg  AFTER HD on HD days (MWFS), and 50mg  on non-HD days (TThSun)   fluconazole 50 MG tablet Commonly known as: DIFLUCAN Take 1 tablet (50 mg total) by mouth as directed. Take 100mg  AFTER HD on HD days (MWFS), and 50mg  on non-HD days (TThSun)   Foltanx 3-35-2 MG Tabs Take 1 tablet by mouth daily.   midodrine 10 MG tablet Commonly known as:  PROAMATINE Take 15 mg by mouth 3 (three) times daily. Taking  15mg  three times daily (1& 1/2 tablet)   multivitamin Tabs tablet Take 1 tablet by mouth at bedtime.   pantoprazole 40 MG tablet Commonly known as: PROTONIX Take 1 tablet (40 mg total) by mouth 2 (two) times daily.   pramipexole 0.25 MG tablet Commonly known as: MIRAPEX Take 0.25 mg by mouth 2 (two) times daily.   rosuvastatin 10 MG tablet Commonly known as: CRESTOR Take 1 tablet (10 mg total) by mouth daily.   sevelamer 800 MG tablet Commonly known as: RENAGEL Take 800 mg by mouth 3 (three) times daily with meals.   tamsulosin 0.4 MG Caps capsule Commonly known as: FLOMAX Take 1 capsule (0.4 mg total) by mouth daily.      No Known Allergies  The results of significant diagnostics from this hospitalization (including imaging, microbiology, ancillary and laboratory) are listed below for reference.    Significant Diagnostic Studies: CT HEAD WO CONTRAST  Result Date: 11/10/2019 CLINICAL DATA:  Syncope, head trauma EXAM: CT HEAD WITHOUT CONTRAST TECHNIQUE: Contiguous axial images were obtained from the base of the skull through the vertex without intravenous contrast. COMPARISON:  06/27/2019 FINDINGS: Brain: No evidence of acute infarction, hemorrhage, extra-axial collection, ventriculomegaly, or mass effect. Bilateral subdural hygromas. Generalized cerebral atrophy. Periventricular white matter low attenuation likely secondary to microangiopathy. Vascular: Cerebrovascular atherosclerotic calcifications are noted. Skull: Negative for fracture or focal lesion. Sinuses/Orbits: Visualized portions of the orbits are unremarkable. Visualized portions of the paranasal sinuses are unremarkable. Visualized portions of the mastoid air cells are unremarkable. Other: None. IMPRESSION: 1. No acute intracranial pathology. 2. Chronic microvascular ischemic changes and cerebral atrophy. Electronically Signed   By: Kathreen Devoid   On:  11/24/2019 08:26   DG Chest Port 1 View  Result Date: 11/05/2019 CLINICAL DATA:  Fall on blood thinners with rib pain on the left EXAM: PORTABLE CHEST 1 VIEW COMPARISON:  10/28/2019 FINDINGS: Cardiomegaly. Single chamber ICD into the right ventricle. Dialysis catheter on the right with tip at the upper cavoatrial junction. Prior median sternotomy. Mild pulmonary scarring. There is no edema, consolidation, effusion, or pneumothorax. IMPRESSION: Stable compared to prior.  No evidence of acute disease. Electronically Signed   By: Monte Fantasia M.D.   On: 11/08/2019 07:45   DG Chest Portable 1 View  Result Date: 10/18/2019 CLINICAL DATA:  Fluid overload EXAM: PORTABLE CHEST 1 VIEW COMPARISON:  Chest radiograph dated 09/01/2019 FINDINGS: A right internal jugular central venous catheter tip overlies the superior cavoatrial junction. A left subclavian approach cardiac device is redemonstrated. Median sternotomy wires are seen. The heart remains enlarged. Vascular calcifications are seen in the aortic arch. Both lungs are clear. The visualized skeletal structures are unremarkable. IMPRESSION: 1. No acute cardiopulmonary process. 2. Cardiomegaly. Electronically Signed   By: Zerita Boers M.D.   On: 10/18/2019 17:59   CUP PACEART REMOTE DEVICE CHECK  Result Date: 10/17/2019 Scheduled remote  reviewed. Normal device function.  Next remote 91 days. Felisa Bonier, RN, MSN  VAS [place] LOWER EXTREMITY VENOUS (DVT) (MC and WL 7a-7p)  Result Date: 10/18/2019  Lower Venous DVTStudy Indications: Edema.  Limitations: Body habitus and poor ultrasound/tissue interface. Comparison Study: 07/06/19 Performing Technologist: Abram Sander RVS  Examination Guidelines: A complete evaluation includes B-mode imaging, spectral Doppler, color Doppler, and power Doppler as needed of all accessible portions of each vessel. Bilateral testing is considered an integral part of a complete examination. Limited examinations for reoccurring indications  may be performed as noted. The reflux portion of the exam is performed with the patient in reverse Trendelenburg.  +---------+---------------+---------+-----------+----------+------------------+ RIGHT    CompressibilityPhasicitySpontaneityPropertiesThrombus Aging     +---------+---------------+---------+-----------+----------+------------------+ CFV      Full           Yes      Yes                                     +---------+---------------+---------+-----------+----------+------------------+ SFJ      Full                                                            +---------+---------------+---------+-----------+----------+------------------+ FV Prox  Full                                                            +---------+---------------+---------+-----------+----------+------------------+ FV Mid   Full                                                            +---------+---------------+---------+-----------+----------+------------------+ FV DistalFull                                                            +---------+---------------+---------+-----------+----------+------------------+ PFV      Full                                                            +---------+---------------+---------+-----------+----------+------------------+ POP      Full           Yes      Yes                                     +---------+---------------+---------+-----------+----------+------------------+ PTV      Full  limited                                                                  visualization      +---------+---------------+---------+-----------+----------+------------------+ PERO                                                  Not visualized     +---------+---------------+---------+-----------+----------+------------------+    +---------+---------------+---------+-----------+----------+--------------+ LEFT     CompressibilityPhasicitySpontaneityPropertiesThrombus Aging +---------+---------------+---------+-----------+----------+--------------+ CFV      Full           Yes      Yes                                 +---------+---------------+---------+-----------+----------+--------------+ SFJ      Full                                                        +---------+---------------+---------+-----------+----------+--------------+ FV Prox  Full                                                        +---------+---------------+---------+-----------+----------+--------------+ FV Mid   Full                                                        +---------+---------------+---------+-----------+----------+--------------+ FV Distal               Yes      Yes                                 +---------+---------------+---------+-----------+----------+--------------+ PFV      Full                                                        +---------+---------------+---------+-----------+----------+--------------+ POP                     Yes      Yes                                 +---------+---------------+---------+-----------+----------+--------------+ PTV      Full                                                        +---------+---------------+---------+-----------+----------+--------------+  PERO                                                  Not visualized +---------+---------------+---------+-----------+----------+--------------+     Summary: BILATERAL: - No evidence of deep vein thrombosis seen in the lower extremities, bilaterally. -   *See table(s) above for measurements and observations. Electronically signed by Monica Martinez MD on 10/18/2019 at 5:36:01 PM.    Final     Microbiology: Recent Results (from the past 240 hour(s))  Culture, blood (single) w Reflex to ID  Panel     Status: None   Collection Time: 11/13/2019  9:43 AM   Specimen: BLOOD  Result Value Ref Range Status   Specimen Description BLOOD LEFT ANTECUBITAL  Final   Special Requests   Final    BOTTLES DRAWN AEROBIC AND ANAEROBIC Blood Culture results may not be optimal due to an inadequate volume of blood received in culture bottles   Culture   Final    NO GROWTH 5 DAYS Performed at [facility], [place] [address], [place], [place] [address]    Report Status 11/05/2019 FINAL  Final  SARS Coronavirus 2 by RT PCR (hospital order, performed in [facility] hospital lab) Nasopharyngeal Nasopharyngeal Swab     Status: None   Collection Time: 11/15/2019 12:25 PM   Specimen: Nasopharyngeal Swab  Result Value Ref Range Status   SARS Coronavirus 2 NEGATIVE NEGATIVE Final    Comment: (NOTE) SARS-CoV-2 target nucleic acids are NOT DETECTED. The SARS-CoV-2 RNA is generally detectable in upper and lower respiratory specimens during the acute phase of infection. The lowest concentration of SARS-CoV-2 viral copies this assay can detect is 250 copies / mL. A negative result does not preclude SARS-CoV-2 infection and should not be used as the sole basis for treatment or other patient management decisions.  A negative result may occur with improper specimen collection / handling, submission of specimen other than nasopharyngeal swab, presence of viral mutation(s) within the areas targeted by this assay, and inadequate number of viral copies (<250 copies / mL). A negative result must be combined with clinical observations, patient history, and epidemiological information. Fact Sheet for Patients:   StrictlyIdeas.no Fact Sheet for Healthcare Providers: BankingDealers.co.za This test is not yet approved or cleared  by the [place] FDA and has been authorized for detection and/or diagnosis of SARS-CoV-2 by FDA under an Emergency Use Authorization (EUA).   This EUA will remain in effect (meaning this test can be used) for the duration of the COVID-19 declaration under Section 564(b)(1) of the Act, 21 U.S.C. section 360bbb-3(b)(1), unless the authorization is terminated or revoked sooner. Performed at [facility], [place] [address], [place], [place] [address]      Labs: Basic Metabolic Panel: Recent Labs  Lab 11/25/2019 0726 11/13/2019 0746 11/23/2019 0943 11/01/19 0316  NA 135 132*  --  133*  K 4.2 3.8  --  4.5  CL 93* 94*  --  94*  CO2 24  --   --  24  GLUCOSE 149* 151*  --  158*  BUN 32* 29*  --  38*  CREATININE 4.88* 5.20*  --  5.80*  CALCIUM 8.5*  --   --  8.3*  MG  --   --  2.1  --   PHOS  --   --   --  4.6   Liver Function Tests: Recent Labs  Lab 11/01/19 0316  ALBUMIN 2.5*   Recent Labs  Lab 11/13/2019 0726  LIPASE 36   No results for input(s): AMMONIA in the last 168 hours. CBC: Recent Labs  Lab 11/03/2019 0726 11/02/2019 0746 11/27/2019 1315 11/01/19 0316  WBC 7.6  --   --  8.0  HGB 9.4* 9.2* 8.6* 9.1*  HCT 29.8* 27.0* 28.4* 29.7*  MCV 104.2*  --   --  101.7*  PLT 78*  --   --  74*   Cardiac Enzymes: No results for input(s): CKTOTAL, CKMB, CKMBINDEX, TROPONINI in the last 168 hours. BNP: BNP (last 3 results) Recent Labs    08/13/19 1437 08/16/19 1659 08/17/19 0714  BNP 1,224.9* 1,081.1* 1,056.0*    ProBNP (last 3 results) Recent Labs    05/21/19 1535  PROBNP 6,191*    CBG: Recent Labs  Lab 11/15/2019 0734 11/01/19 0718  GLUCAP 153* 240*    Principal Problem:   Syncope and collapse Active Problems:   ICD (implantable cardioverter-defibrillator) in place   Chronic combined systolic and diastolic CHF (congestive heart failure) (HCC)   Thrombocytopenia (HCC)   Anemia   Hypotension   ESRD on dialysis ([place])   DNR (do not resuscitate)   VT (ventricular tachycardia) ([place])   End of life care   Terminal care   Time coordinating discharge: 38 minutes  Signed:        Klohe Lovering,  DO [facility]  11/05/2019, 5:44 PM

## 2019-11-29 NOTE — Progress Notes (Signed)
Palliative Medicine Inpatient Follow Up Note   Reason for consult:  Goals of Care, Comfort Measures  HPI:  Per intake H&P --> Ricardo Payne a 86 y.o.malewith medical history significant ofHTN, HLD, CAD, chronic combined systolic and diastolic CHF, s/p AICD,PAF on Eliquis, ESRD on HD, , and CVA who presents after having a syncopal event. Son reportedly found him on the floor. He denies having any chest pain or palpitation. Patient had just recently been hospitalized for hypotension, cellulitis, GI bleed transfused 1 unit of blood. EGD significant for gastric petechia with concern for esophageal candidiasis with plan to treat with 10 to 14 days of fluconazole. Colonoscopy at that time had not been recommended due to advanced age.  Palliative care was asked to get involved to discuss goals of care.  Today's Discussion (11/15/2019): Chart reviewed. Patient awake and alert this morning. He is eating an ice cream. We discussed again plan for transfer. He endorses good control of his pain and dyspnea.   Discussed the importance of continued conversation with family and their  medical providers regarding overall plan of care and treatment options, ensuring decisions are within the context of the patients values and GOCs.  Questions and concerns addressed   Vital Signs Vitals:   11/02/19 2231 11/03/19 1230  BP: (!) 82/61 (!) 93/55  Pulse: (!) 108 60  Resp: 18 16  Temp: 98.4 F (36.9 C) (!) 97.5 F (36.4 C)  SpO2: 90% 93%    Intake/Output Summary (Last 24 hours) at 11/15/19 8469 Last data filed at 11/03/2019 2200 Gross per 24 hour  Intake 100 ml  Output --  Net 100 ml   Last Weight  Most recent update: 11/01/2019  5:56 AM   Weight  86 kg (189 lb 9.5 oz)           SUMMARY OF RECOMMENDATIONS DNAR/DNI  Remains on comfort measures  Transition to residential hospice [facility], awaiting a bed)  Symptom control as below  Code Status/Advance Care  Planning: DNAR/DNI  Symptom Management: Dyspnea: Pain:                 - Dilaudid 0.5-1mg  IV Q1H PRN Fever:                 - Tylenol 650mg  PO/PR Q6H PRN Agitation: Anxiety:                 - Lorazepam 0.5-1mg  IV  Q1H PRN Nausea:                 - Zofran 4mg  PO/IV Q6H PRN             Secretions:                 - Glycopyrrolate 0.4mg  IV Q4H ATC Dry Eyes:                 - Artificial Tears PRN Xerostomia:                 - Medline Mouth rinse BID                 - BID oral care Urinary Retention:                 - Bladder scan if > 215ml place and maintain foley  Constipation:                 - Bisacodyl 10mg  PR PRN QDay Spiritual:                 -  Chaplain consult  AICD deactivated HD stopped  Time Spent: 15 Greater than 50% of the time was spent in counseling and coordination of care ______________________________________________________________________________________ [place] Team Team Cell Phone: [phone] Please utilize secure chat with additional questions, if there is no response within 30 minutes please call the above phone number  Palliative Medicine Team providers are available by phone from 7am to 7pm daily and can be reached through the team cell phone.  Should this patient require assistance outside of these hours, please call the patient's attending physician.

## 2019-11-29 NOTE — Progress Notes (Signed)
Pt without pulse or respirations. Verified by Boyd Kerbs, RN and Gerarda Fraction, RN. Dr Kennon Holter notified of time of death at 14.

## 2019-11-29 NOTE — Progress Notes (Signed)
PROGRESS NOTE  Ricardo Payne VZC:[id number] DOB: [birth year] DOA: 11/20/2019 PCP: [name], FNP  Brief History   The patient is a 86 yr old man with ESRD on HD. He presented to [facility] ED with complaints of syncope that was associated with VT/Poly morphicVT and ICD shocks. The patient has been admitted to a telemetry bed. Nephrology was consulted as has been cardiology and electrophysiology. Due to the patient's ischemic CMO and arrhythmias with syncope, he is no longer a candidate for HD. The patient has chosen to be made a DNR with comfort care only.  Consultants  . Cardiology . Electrophysiology . Nephrology . Palliative Care  Procedures  . None  Antibiotics   Anti-infectives (From admission, onward)   None     Subjective  The patient is resting comfortably. No new complaints.  Objective   Vitals:  Vitals:   11/02/19 2231 11/03/19 1230  BP: (!) 82/61 (!) 93/55  Pulse: (!) 108 60  Resp: 18 16  Temp: 98.4 F (36.9 C) (!) 97.5 F (36.4 C)  SpO2: 90% 93%   Exam:  Constitutional:  . The patient is awake, alert, and oriented x 3. No acute distress. Respiratory:  . No increased work of breathing. . No wheezes, rales, or rhonchi . No tactile fremitus Cardiovascular:  . Regular rate and rhythm . No murmurs, ectopy, or gallups. . No lateral PMI. No thrills. Abdomen:  . Abdomen is soft, non-tender, non-distended . No hernias, masses, or organomegaly . Normoactive bowel sounds.  Musculoskeletal:  . No cyanosis, clubbing, or edema Skin:  . No rashes, lesions, ulcers . palpation of skin: no induration or nodules Neurologic:  . CN 2-12 intact . Sensation all 4 extremities intact Psychiatric:  . Mental status o Mood, affect appropriate o Orientation to person, place, time  . judgment and insight appear intact  I have personally reviewed the following:   Today's Data  . Vitals  Cardiology Data  . EKG  Scheduled Meds: . glycopyrrolate  0.4 mg  Intravenous Q4H  . mouth rinse  15 mL Mouth Rinse BID  . oxymetazoline  1 spray Each Nare BID  . pramipexole  0.25 mg Oral BID   Principal Problem:   Syncope and collapse Active Problems:   ICD (implantable cardioverter-defibrillator) in place   Chronic combined systolic and diastolic CHF (congestive heart failure) (HCC)   Thrombocytopenia (HCC)   Anemia   Hypotension   ESRD on dialysis ([place])   DNR (do not resuscitate)   VT (ventricular tachycardia) ([place])   End of life care   Terminal care   LOS: 4 days   A & P  Syncope and collapse; Polymorphic V. Tach;AICD in place:  Acute. Patient presents after having syncopal episode.  Interrogation of pacemaker revealed polymorphic ventricular tachycardia patient receiving 3 shocks.  QT was noted to be prolonged at 497.  Patient was seen by Dr. Curt Bears of EP recommended to be loaded with amiodarone. Overall patient noted to be in end-stage heart failure.  Limited options at this time with poor overall prognosis given decreased ability for patient to tolerate hemodialysis for which nephrology recommended consulting hospice. The patient has chosen to be comfort care only. AICD will be turned off. Palliative care has been consulted.  Hypotension: Acute on chronic. Patient on midodrine 15 mg 3 times daily.  Initial blood pressures noted to be as low as 74/55 even after receiving 500 mL fluid bolus. The patient will be made comfort care only. Blood pressures will  not be followed or treated.     Paroxysmal atrial fibrillation on chronic anticoagulation: Patient had been okayed after EGD to restart Eliquis.  CHA2DS2-VASc score = 6. Eliquis will be stopped as the patient is now comfort care only.   Chronic combined systolic and diastolic CHF: Chest x-ray was otherwise noted to be stable.  Last EF noted to be less than 20% with global hypokinesis.  Patient with some lower extremity edema appreciated, but does not appear grossly fluid overloaded.   Cardiology on board and following. The patient will now be comfort care only. Palliative care has been consulted.  Anemia/question GI bleed: Hemoglobin 9.4->8.6 on admission which appears improved from previous at discharge of 8.4 on 5/25.  Stool guaiacs however noted to still be positive. Comfort care only.  Esophageal candidiasis: Patient had been on fluconazole, but his medications need to be discontinued as it could have been putting patient at risk for going into VT. Comfort care only.  Throat pain: Chlorasceptic spray  Thrombocytopenia: Chronic. Platelet count 78 which appears near patient baseline. Comfort care only.  ESRD on HD: Patient normally dialyzes Monday, Wednesday, Friday, and Saturday.  Labs significant for potassium 4.2, BUN 32, and creatinine 4.88.  Right chest catheter in place. The patient will be comfort care only. No further dialysis. Palliative care has been consulted.  I have seen and examined this patient myself. I have spent 25 minutes in his evaluation and care.  DVT prophylaxis: SCD Code Status: DNR Family Communication: None available. Family aware of decision of patient to become comfort care. Disposition Plan: To be determined  Agueda Houpt, DO [facility] Direct contact: see [email or web address]  7PM-7AM contact night coverage as above 11-28-19, 5:05 PM  LOS: 1 day

## 2019-11-29 NOTE — Progress Notes (Signed)
Engineer, maintenance [facility]) Hospital Liaison note.    [place] is unable to offer a room today. Hospital Liaison will follow up tomorrow or sooner if a room becomes available.    This RN spoke with son Dwayne to make him aware no beds at this time; he verbalized understanding.   Please do not hesitate to call with questions.    Thank you,    Domenic Moras, BSN, [place] (in [place]) [phone] (24 h on call)

## 2019-11-29 DEATH — deceased

## 2020-01-08 ENCOUNTER — Ambulatory Visit: Payer: Medicare Other | Admitting: Internal Medicine

## 2020-01-08 ENCOUNTER — Other Ambulatory Visit: Payer: Medicare Other

## 2020-07-24 IMAGING — CT CT HEAD W/O CM
4 series · 16 of 47 positions shown, 18 images · non-contrast
Comparison: 06/27/2019

CLINICAL DATA: Syncope, head trauma

EXAM:
CT HEAD WITHOUT CONTRAST
TECHNIQUE: Contiguous axial images were obtained from the base of the skull
through the vertex without intravenous contrast.

[Series 3: head wo · axial · 0.41mm/px · z∈[-62,+58]mm · 7 of 32 slices shown, 9 images]
[im 4/32  brain]
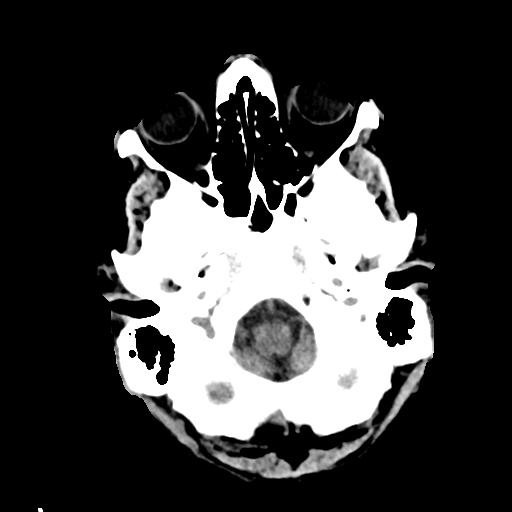
[im 4/32  bone]
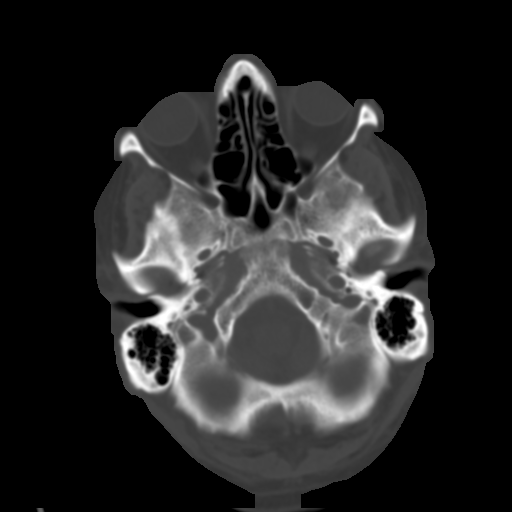
[im 8/32  brain]
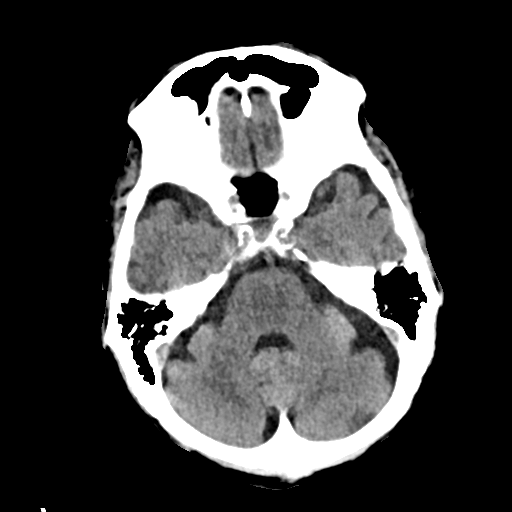
[im 12/32  brain]
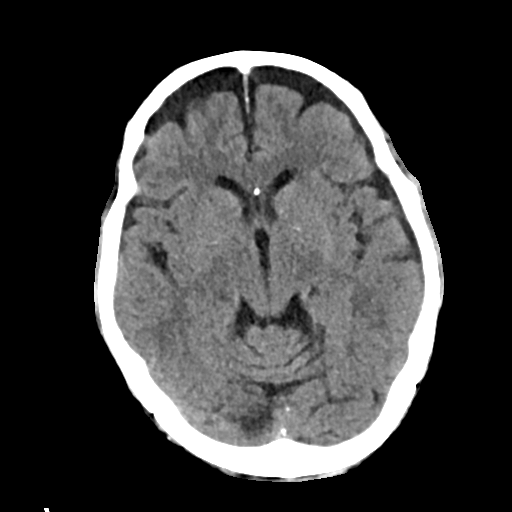
[im 16/32  brain]
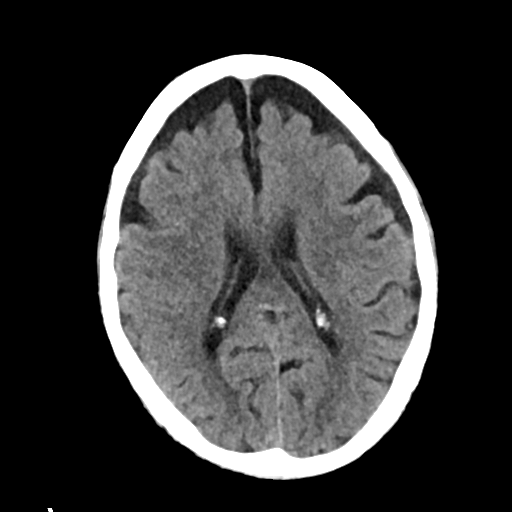
[im 20/32  brain]
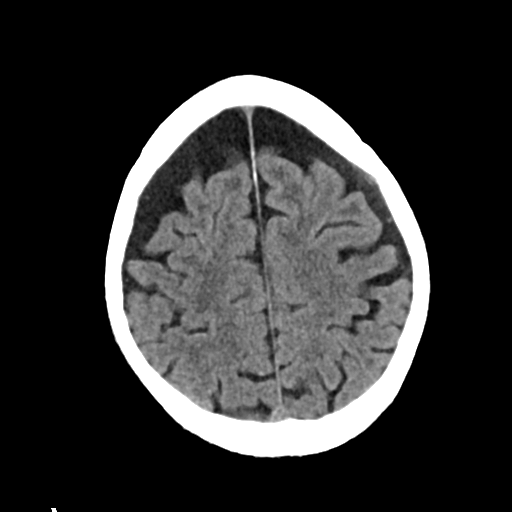
[im 20/32  bone]
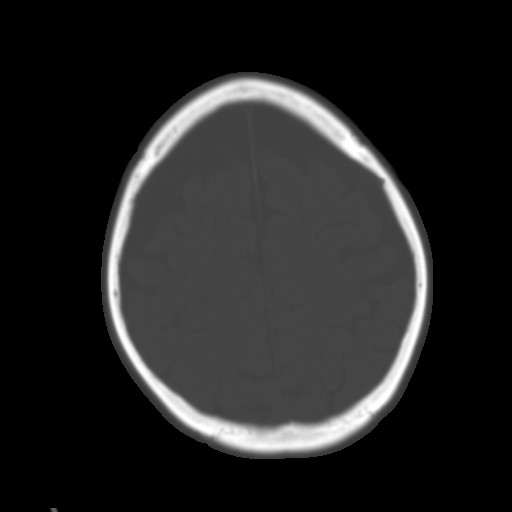
[im 24/32  brain]
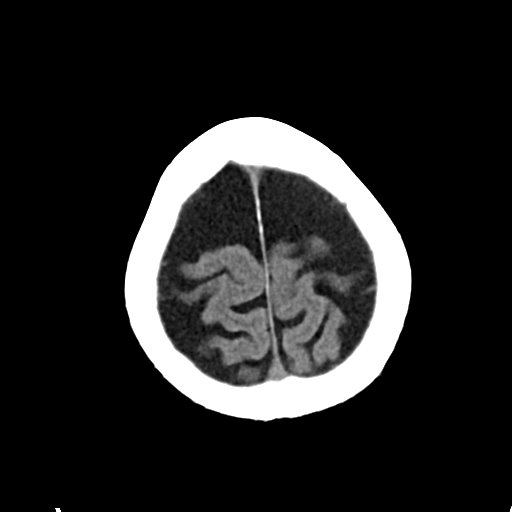
[im 28/32  brain]
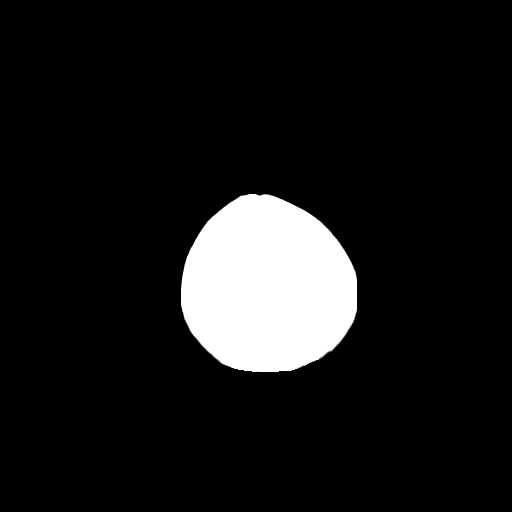

[Series 4: head bone · axial · 0.41mm/px · z∈[-62,-30]mm · 3 of 80 slices shown]
[im 8/80  bone]
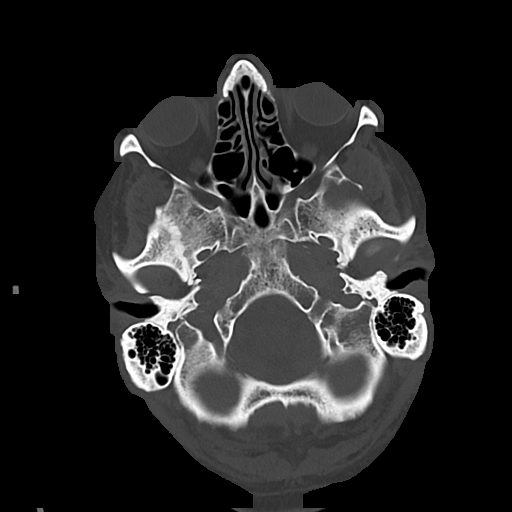
[im 16/80  bone]
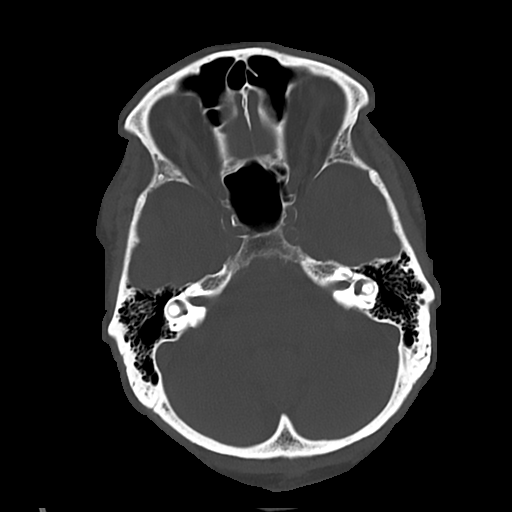
[im 24/80  bone]
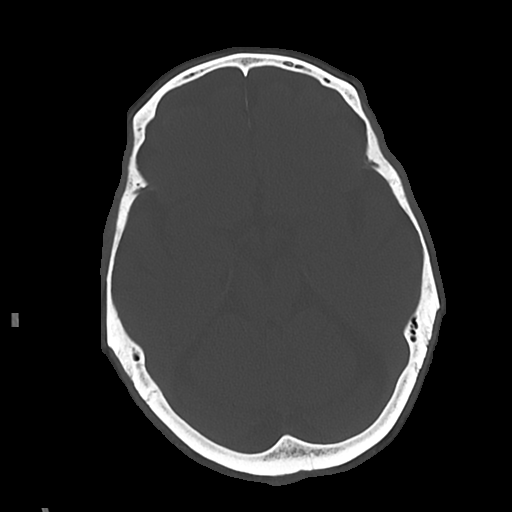

[Series 5: cor soft · coronal · 0.33mm/px · 3 of 71 slices shown]
[im 24/71  brain]
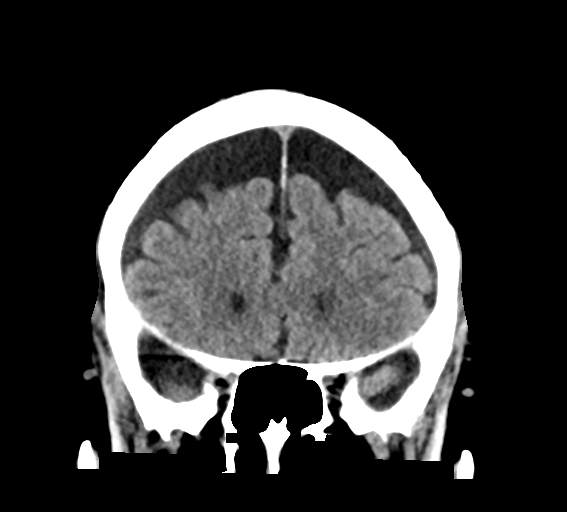
[im 32/71  brain]
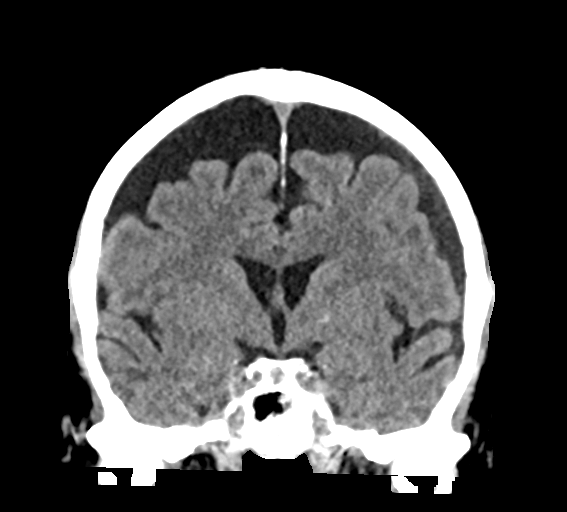
[im 39/71  brain]
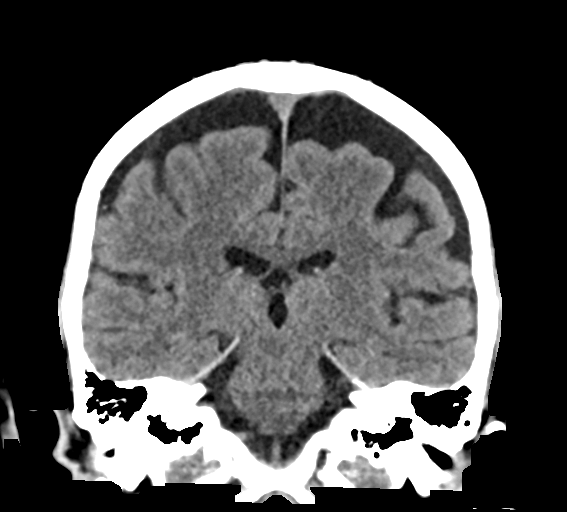

[Series 6: sag soft · sagittal · 0.32mm/px · 3 of 62 slices shown]
[im 21/62  brain]
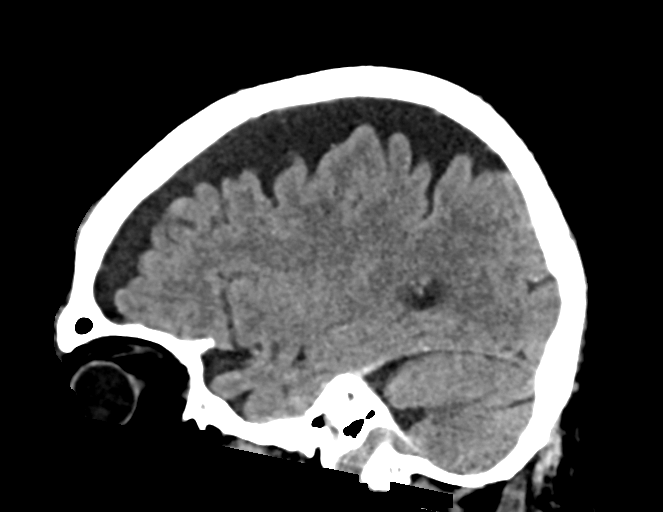
[im 31/62  brain]
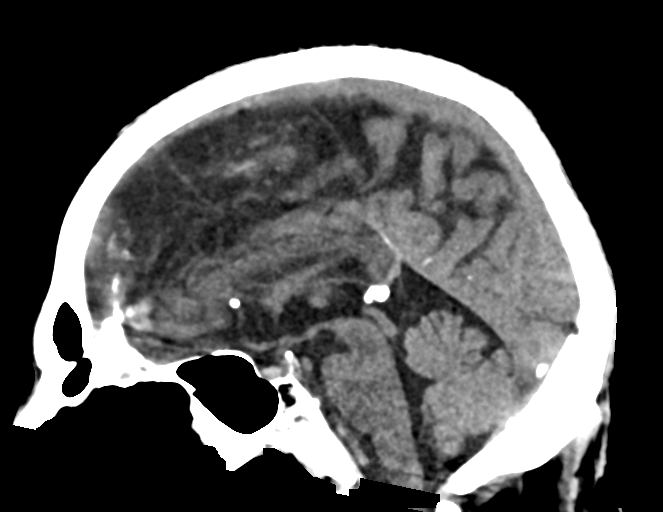
[im 41/62  brain]
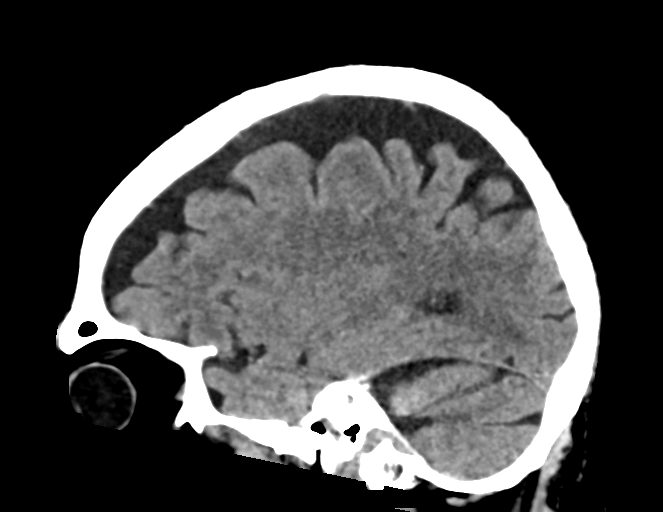

[16 of 47 positions shown; findings below may reference images not displayed]

FINDINGS: Brain: No evidence of acute infarction, hemorrhage, extra-axial
collection, ventriculomegaly, or mass effect. Bilateral subdural
hygromas. Generalized cerebral atrophy. Periventricular white matter
low attenuation likely secondary to microangiopathy.

Vascular: Cerebrovascular atherosclerotic calcifications are noted.

Skull: Negative for fracture or focal lesion.

Sinuses/Orbits: Visualized portions of the orbits are unremarkable.
Visualized portions of the paranasal sinuses are unremarkable.
Visualized portions of the mastoid air cells are unremarkable.

Other: None.
IMPRESSION: 1. No acute intracranial pathology.
2. Chronic microvascular ischemic changes and cerebral atrophy.

## 2020-07-24 IMAGING — DX DG CHEST 1V PORT
1 series · 1 of 1 positions shown · non-contrast
Comparison: 10/28/2019

CLINICAL DATA: Fall on blood thinners with rib pain on the left

EXAM:
PORTABLE CHEST 1 VIEW

[chest ap]
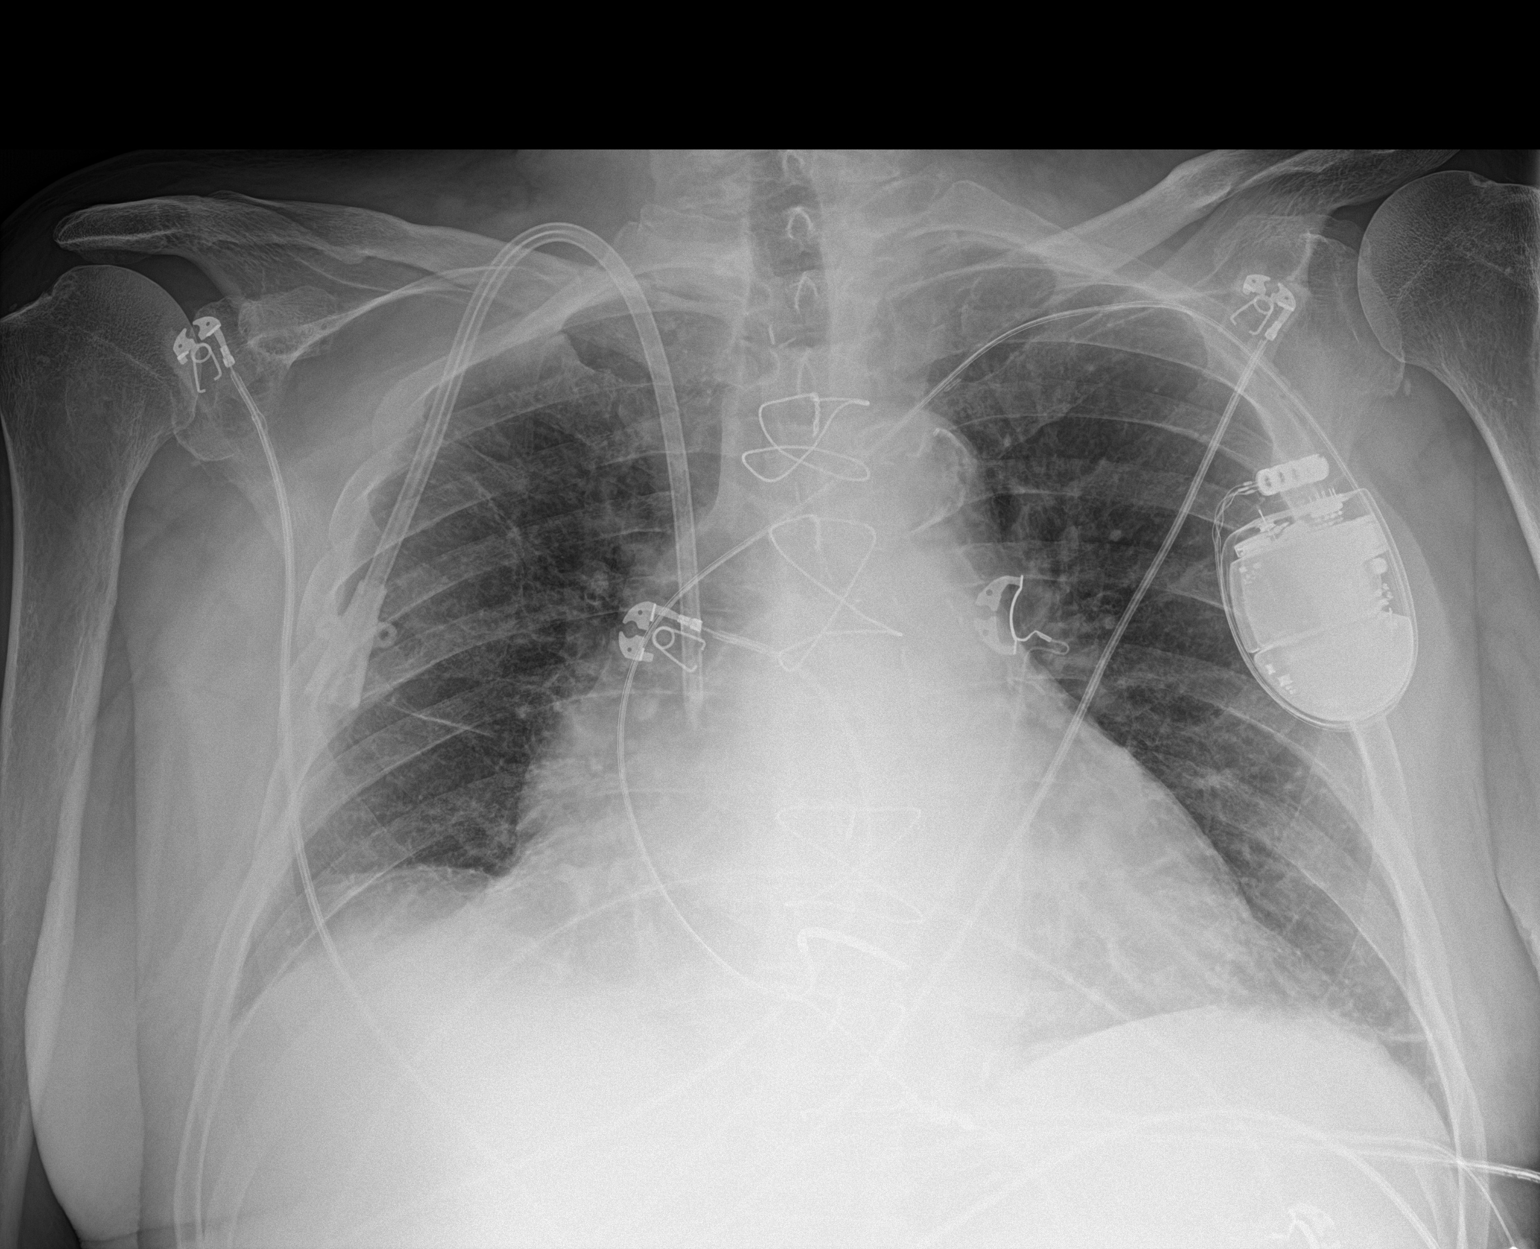

[1 of 1 positions shown; findings below may reference images not displayed]

FINDINGS: Cardiomegaly. Single chamber ICD into the right ventricle. Dialysis
catheter on the right with tip at the upper cavoatrial junction.
Prior median sternotomy. Mild pulmonary scarring. There is no edema,
consolidation, effusion, or pneumothorax.
IMPRESSION: Stable compared to prior.  No evidence of acute disease.
